# Patient Record
Sex: Female | Born: 1965 | Race: White | Hispanic: No | Marital: Married | Smoking: Never smoker
Health system: Southern US, Community
[De-identification: ages and names within clinical notes are randomized; demographics above are authoritative.]

## PROBLEM LIST (undated history)

## (undated) DIAGNOSIS — R011 Cardiac murmur, unspecified: Secondary | ICD-10-CM

## (undated) DIAGNOSIS — G4730 Sleep apnea, unspecified: Secondary | ICD-10-CM

## (undated) DIAGNOSIS — G5603 Carpal tunnel syndrome, bilateral upper limbs: Secondary | ICD-10-CM

## (undated) DIAGNOSIS — M1990 Unspecified osteoarthritis, unspecified site: Secondary | ICD-10-CM

## (undated) DIAGNOSIS — F419 Anxiety disorder, unspecified: Secondary | ICD-10-CM

## (undated) DIAGNOSIS — J45909 Unspecified asthma, uncomplicated: Secondary | ICD-10-CM

## (undated) DIAGNOSIS — K219 Gastro-esophageal reflux disease without esophagitis: Secondary | ICD-10-CM

## (undated) DIAGNOSIS — M65311 Trigger thumb, right thumb: Secondary | ICD-10-CM

## (undated) DIAGNOSIS — E669 Obesity, unspecified: Secondary | ICD-10-CM

## (undated) DIAGNOSIS — I10 Essential (primary) hypertension: Secondary | ICD-10-CM

## (undated) DIAGNOSIS — E7800 Pure hypercholesterolemia, unspecified: Secondary | ICD-10-CM

## (undated) DIAGNOSIS — K589 Irritable bowel syndrome without diarrhea: Secondary | ICD-10-CM

## (undated) DIAGNOSIS — K5090 Crohn's disease, unspecified, without complications: Secondary | ICD-10-CM

## (undated) HISTORY — PX: CYST REMOVAL HAND: SHX6279

## (undated) HISTORY — DX: Essential (primary) hypertension: I10

## (undated) HISTORY — PX: OTHER SURGICAL HISTORY: SHX169

## (undated) HISTORY — DX: Crohn's disease, unspecified, without complications: K50.90

## (undated) HISTORY — DX: Pure hypercholesterolemia, unspecified: E78.00

## (undated) HISTORY — PX: TONSILLECTOMY: SUR1361

---

## 2002-05-14 ENCOUNTER — Inpatient Hospital Stay (HOSPITAL_COMMUNITY): Admission: AD | Admit: 2002-05-14 | Discharge: 2002-05-16 | Payer: Self-pay | Admitting: Internal Medicine

## 2002-06-30 ENCOUNTER — Encounter (HOSPITAL_COMMUNITY): Admission: RE | Admit: 2002-06-30 | Discharge: 2002-07-30 | Payer: Self-pay | Admitting: Internal Medicine

## 2002-08-21 ENCOUNTER — Encounter (HOSPITAL_COMMUNITY): Admission: RE | Admit: 2002-08-21 | Discharge: 2002-09-20 | Payer: Self-pay | Admitting: Internal Medicine

## 2004-04-30 ENCOUNTER — Inpatient Hospital Stay (HOSPITAL_COMMUNITY): Admission: EM | Admit: 2004-04-30 | Discharge: 2004-05-04 | Payer: Self-pay | Admitting: Emergency Medicine

## 2004-05-14 ENCOUNTER — Ambulatory Visit (HOSPITAL_COMMUNITY): Admission: RE | Admit: 2004-05-14 | Discharge: 2004-05-14 | Payer: Self-pay | Admitting: Internal Medicine

## 2004-09-24 ENCOUNTER — Ambulatory Visit: Payer: Self-pay | Admitting: Internal Medicine

## 2005-02-23 ENCOUNTER — Ambulatory Visit: Payer: Self-pay | Admitting: Internal Medicine

## 2005-10-01 ENCOUNTER — Ambulatory Visit: Payer: Self-pay | Admitting: Internal Medicine

## 2006-03-31 ENCOUNTER — Ambulatory Visit: Payer: Self-pay | Admitting: Internal Medicine

## 2006-11-11 ENCOUNTER — Ambulatory Visit: Payer: Self-pay | Admitting: Internal Medicine

## 2009-08-31 DIAGNOSIS — J189 Pneumonia, unspecified organism: Secondary | ICD-10-CM

## 2009-08-31 HISTORY — DX: Pneumonia, unspecified organism: J18.9

## 2010-09-09 ENCOUNTER — Ambulatory Visit: Admit: 2010-09-09 | Payer: Self-pay | Admitting: Internal Medicine

## 2010-09-21 ENCOUNTER — Encounter: Payer: Self-pay | Admitting: Family Medicine

## 2010-10-07 ENCOUNTER — Ambulatory Visit (INDEPENDENT_AMBULATORY_CARE_PROVIDER_SITE_OTHER): Payer: BC Managed Care – PPO | Admitting: Internal Medicine

## 2010-10-07 DIAGNOSIS — K5090 Crohn's disease, unspecified, without complications: Secondary | ICD-10-CM

## 2011-01-16 NOTE — Procedures (Signed)
NAME:  [name], [name]                      ACCOUNT NO.:  [id number]   MEDICAL RECORD NO.:  [id number]                   PATIENT TYPE:  INP   LOCATION:  A306                                 FACILITY:  [facility]   PHYSICIAN:  Vida Roller, M.D.                DATE OF BIRTH:  [birth year]   DATE OF PROCEDURE:  05/01/2004  DATE OF DISCHARGE:                                  ECHOCARDIOGRAM   PRIMARY CARE PHYSICIAN:  Vania Rea, M.D.   TAPE NUMBER:  [id number]   TAPE COUNT:  [id number]   CLINICAL INFORMATION:  A 45 year old woman with Crohn's disease.   TECHNICAL QUALITY:  The technical quality of this study is extremely poor.   M-MODE TRACINGS:  1.  The aorta is 27 mm.  2.  The left atrium is 32 mm.  3.  The septum is 9 mm.  4.  The posterior wall is 9 mm.  5.  The left ventricular diastolic dimension is 46 mm.  6.  The left ventricular systolic dimension is 31 mm.   2-D AND DOPPLER IMAGING:  1.  The left ventricle is normal size with normal systolic function.      Assessment of wall motion is beyond the limits of this study.  2.  The right ventricle is normal size.  Both atria appear to be normal      size.  3.  The valvular structures are difficult to assess.  There does not appear      to be any significant mitral regurgitation or aortic stenosis or aortic      regurgitation.  Beyond that, this study is limited.      ___________________________________________                                            Vida Roller, M.D.   JH/MEDQ  D:  05/01/2004  T:  05/02/2004  Job:  [id number]

## 2011-01-16 NOTE — Discharge Summary (Signed)
NAME:  [name], [name]                      ACCOUNT NO.:  [id number]   MEDICAL RECORD NO.:  [id number]                   PATIENT TYPE:  INP   LOCATION:  A306                                 FACILITY:  [facility]   PHYSICIAN:  Melvyn Novas, MD        DATE OF BIRTH:  [birth year]   DATE OF ADMISSION:  04/30/2004  DATE OF DISCHARGE:  05/04/2004                                 DISCHARGE SUMMARY   HISTORY:  The patient is a 45 year old female with Crohn's colitis who came  to the emergency room with some bloody diarrhea and lower abdominal  cramping.  She had been off of her Pentasa for several weeks.  She was  admitted to the hospital and started on oral steroids.  She was given IV  Flagyl as well as Cipro.  She was also started on steroids while in the  hospital.  Her symptoms were subsiding on the clear-liquid diet.  She was  started on IV Solu-Medrol and then subsequently on p.o. prednisone 40 mg per  day.  In addition, six mercaptopurine 50 mg per day was added.  She resolved  clinically and had decreasing hematochezia and only one or two episodes of  loose stools a day as opposed to frank diarrhea.  Her CBC and SMA-7 were  essentially normal.  Hemoglobin was 12.1 on the day of discharge.   DISCHARGE MEDICATIONS:  1.  Advair 250/50, q.12h.  2.  Pentasa 1000 mg q.i.d.  3.  Cipro 500 b.i.d. for one week.  4.  Flagyl 250 q.8h for week.  5.  Six mercaptopurine 50 mg daily.  6.  Prednisone 40 mg per day.   FOLLOWUP:  She will be followed up by Dr. Karilyn Cota in the office in one to two  week's time.     ___________________________________________                                         Melvyn Novas, MD   RMD/MEDQ  D:  05/04/2004  T:  05/04/2004  Job:  [id number]

## 2011-01-16 NOTE — Consult Note (Signed)
[name], [name]                      ACCOUNT NO.:  [id number]   MEDICAL RECORD NO.:  [id number]                   PATIENT TYPE:  INP   LOCATION:  A306                                 FACILITY:  [facility]   PHYSICIAN:  R. Roetta Sessions, M.D.              DATE OF BIRTH:  03/07/1966   DATE OF CONSULTATION:  05/01/2004  DATE OF DISCHARGE:                                   CONSULTATION   REASON FOR CONSULTATION:  Crohn's colitis.   HISTORY OF PRESENT ILLNESS:  The patient is a 45 year old Caucasian female  with a history of Crohn's colitis who was admitted with a suspected flare.  She was recently in our office initially on April 02, 2004 with suspected  Crohn's colitis flare.  She had been off of her mesalamine for a couple of  days due to difficulty getting her medication at the pharmacy.  She had also  discontinued Imuran in March with plans to conceive a third child.  She was  started on Rowasa enemas at that time.  She had a stool culture and C.  difficile which were negative.  She called the office approximately a week  or so later, stating that the Rowasa enemas had not helped and her symptoms  had worsened.  She was started on prednisone 30 mg daily at that time.  I  saw her 4 days later and she had already noted a dramatic improvement.  In  fact, her stools were no longer as bloody and she was down to having six  bowel movements daily.  She has had consistency in her stools as well.  She  was continued on a prednisone taper at that time.  With this evaluation, she  had been noted to have an elevated eosinophil count of 11%, however, this  had decreased back to normal on prednisone therapy.  She called earlier in  the week saying that she was having multiple bloody stools again.  Her  prednisone was increased back to 30 mg and she was started on Flagyl 250 mg  t.i.d.  She was also on Imodium 2 mg t.i.d. and Pentasa.  The patient called  yesterday and stated that she just was  not doing well.  She was having 17 to  18 bloody bowel movements daily.  She was having severe nausea but no  vomiting.  She was running low-grade temperatures of 99 degrees.  Dr. Karilyn Cota  felt that she needed to be admitted and she was asked to come to the  emergency department for evaluation.   In the emergency department, her white count was 13,600, hemoglobin 13.3,  hematocrit 39.1, platelets 342,000.  BUN 11, creatinine 0.9, potassium 4.2,  glucose 112, albumin 2.8, otherwise LFTs and lipase normal.  HCG was  negative.  Today, her white count remains at 13,600, eosinophil count is  11,000,  hemoglobin is 12, hematocrit 34.  She was continued on prednisone  30  mg orally daily along with Flagyl 250 mg t.i.d. and Imodium 2 mg t.i.d.  and Pentasa 1000 mg q.i.d.   The patient continues to complain of multiple bloody stools daily.  She has  had six to eight stools since admission, late last night.  She has seen a  large amount of fresh blood in her bowel movements.  Her stools are very  liquid.  She is having severe pain along the upper abdomen and down the left  side of the abdomen, especially with bowel movements.  She has a constant  pain, 5/10, at other times as well.  She feels diaphoretic with bowel  movements.  She feels like she is going to pass out.  She continues to be  nauseated but has had no vomiting.  Temperature was 100.7 last night.   MEDICATIONS PRIOR TO ADMISSION:  1.  Pentasa 1000 mg q.i.d.  2.  Levsin sublingual q.a.c. and q.h.s.  3.  Prednisone 30 mg daily.  4.  Flagyl 250 mg t.i.d.  5.  Advair b.i.d.  6.  Imodium 2 mg t.i.d.  7.  Depo-Provera x3 months.   ALLERGIES:  PENICILLIN CAUSES RASH.  REMICADE CAUSED ARTHRITIC-TYPE REACTION  WITH SEVERE JOINT STIFFNESS AND PAIN.   PAST MEDICAL HISTORY:  Bronchial asthma, Crohn's colitis diagnosed in July  1999 by colonoscopy at which time she presented with bloody diarrhea.  She  has been on Imuran for quite some time,  however, discontinued this in  February 2005 in order to prepare to conceive a third child.   PAST SURGICAL HISTORY:  SHe has had a prior tonsillectomy, excision left  hand ganglion cyst and cyst removed from her back.   FAMILY HISTORY:  Negative for chronic GI illnesses or colorectal cancer.   SOCIAL HISTORY:  She is married and has two sons.  She works for the school  system.  She denies any tobacco or alcohol use.   REVIEW OF SYSTEMS:  Please see HPI for GI.  CARDIOPULMONARY:  Denies any  chest pain or shortness of breath.  GENITOURINARY:  She states she sees  blood when she wipes after urinating but denies any dysuria.   PHYSICAL EXAMINATION:  VITAL SIGNS:  Weight 245.5, height 63 inches.  T-max  100.7, current temperature 98.4, pulse 107, respirations 20, blood pressure  119/72.  GENERAL:  Pleasant, obese, Caucasian female in no acute distress.  SKIN:  Warm and dry, no jaundice.  HEENT:  Pupils equal round and reactive to light, conjunctivae were pink,  sclerae nonicteric.  Oropharyngeal mucosa moist and pink, no lesions,  erythema or exudate.  NECK:  No lymphadenopathy or thyromegaly.  CHEST:  Lungs clear to auscultation.  CARDIAC:  Regular rate and rhythm, normal S1/S2, no murmurs, rubs or  gallops.  ABDOMEN:  Positive bowel sounds, obese but symmetrical and soft.  She has  moderate tenderness in the epigastric region, along the left and to deep  palpation.  Positive guarding.  No rebound tenderness, organomegaly or  masses.  EXTREMITIES:  No edema.   LABORATORY DATA:  As mentioned in the HPI.  In addition, total bilirubin  1.2, alkaline phosphatase 88, AST 11, ALT 16, amylase 55, lipase 24.   Acute abdominal series, results pending.   IMPRESSION:  The patient is a pleasant 45 year old lady with approximately 6-  year history of Crohn's colitis who presents with suspected flare of her  disease.  She has been on treatment as an outpatient but was not  improving, therefore, required  admission.  Somewhat clouding her picture is the fact  that she has an elevated eosinophil count which did initially respond to  prednisone therapy.  We cannot rule out that eosinophilia gastroenteritis is  playing a role in her symptoms.  Unfortunately, the patient had been doing  well while on her 6-MP, but this was stopped in order to consider having an  additional child.  She has been off therapy now since February 2005.   PLAN:  1.  We will switch her prednisone to IV Solu-Medrol 30 mg q.6h.  2.  Switch Flagyl to IV 500 mg q.8h.  We will add IV Cipro 400 mg q.12h.  We      will add Levsin q.a.c. and obtain urinalysis for culture and if positive      we will follow up on stool studies.  We will review acute abdominal      series results as they are available.  3.  Ultimately, if the patient is willing, would recommend that she go back      on her immunosuppressant therapy in the way of 6-MP.     ________________________________________  ___________________________________________  Tana Coast, P.AJonathon Bellows, M.D.   LL/MEDQ  D:  05/01/2004  T:  05/01/2004  Job:  [id number]

## 2011-01-16 NOTE — H&P (Signed)
NAME:  [name], [name]                      ACCOUNT NO.:  [id number]   MEDICAL RECORD NO.:  [id number]                   PATIENT TYPE:  INP   LOCATION:  A306                                 FACILITY:  [facility]   PHYSICIAN:  Tesfaye D. Felecia Mcintyre, M.D.              DATE OF BIRTH:  08/26/1966   DATE OF ADMISSION:  04/30/2004  DATE OF DISCHARGE:                                HISTORY & PHYSICAL   CHIEF COMPLAINT:  Nausea, vomiting and a bloody diarrhea.   HISTORY OF PRESENT ILLNESS:  This is a 45 year old female patient with a  known case of Crohn's disease since 1998; came to the emergency room with  the above complaint, the patient had worsening of her symptoms for the last  1 month.  The patient was seen by her gastroenterologist, Dr. Karilyn Cota, and  she was started on oral steroids. The patient was also given Flagyl and her  medication has been adjusted.  Her symptoms continued to get worse.  Since  yesterday the patient was having recurrent vomiting and was unable to  tolerate any oral intake.  She was getting weak and dizzy.  The patient then  came to the emergency room where she was evaluated.  The patient was  admitted for further treatment.   REVIEW OF SYSTEMS:  The patient feels weak and has abdominal pain.  She has  no fever, chills, chest pain, dysuria, urgency, or frequency of urination.   PAST MEDICAL HISTORY:  1.  Crohn's disease since 1998.  2.  Bronchial asthma.  3.  History of tonsillectomy.  4.  History of excision of ganglion from the left hand.  5.  History of cyst removal from her back.   CURRENT MEDICATIONS:  1.  Pentasa 1000 mg p.o. q.i.d.  2.  Prednisolone 30 mg p.o. daily.  3.  Imodium 2 mg p.o. t.i.d.  4.  Flagyl 250 mg t.i.d.  5.  Advair 500/50 two puffs b.i.d.  6.  Multivitamin 1 tablet p.o. daily.   SOCIAL HISTORY:  The patient is married. She works for the school system.  The patient has no history of alcohol, tobacco or substance abuse.   PHYSICAL  EXAMINATION:  GENERAL:  The patient is alert, awake, chronically  sick looking.  VITAL SIGNS:  Blood pressure 137/77, pulse 108, respiratory rate 20,  temperature 99.1 degree F.  HEENT:  Pupils are equal, reactive.  NECK:  Neck is supple.  CHEST:  Decreased air entry.  A few rhonchi.  CARDIOVASCULAR:  First and second heart sounds heard.  No murmurs, no  gallops.  ABDOMEN:  Soft and lax, bowel sounds are positive.  No organomegaly.  EXTREMITIES:  No leg edema.   LABS ON ADMISSION:  CBC WBC 13.6, hemoglobin 13.3, hematocrit 39.1,  platelets 342.  Sodium 136, potassium 4.2, chloride 102, carbon dioxide 27,  glucose 112, BUN 11, creatinine 0.9.  Total bilirubin 1.2, alkaline  phosphatase 88, SGOT 11,  SGPT 16, total protein 6.7, albumin 2.8, calcium  8.7, amylase 56, lipase 24.   ASSESSMENT:  This is a 45 year old female patient with known case of Crohn's  disease came with nausea, vomiting and bloody diarrhea.  She has been on  follow up with her gastroenterologist, Dr. Karilyn Cota.  She has been on oral  steroids and Flagyl.  However, the patient's symptoms are getting worse and  in the last 24 hours the patient has been recurrently vomiting and unable to  keep her oral intake.  The patient obviously has exacerbation of her Crohn's  disease.   PLAN:  Will keep the patient on clear liquid diet.  Will continue IV fluid.  Will continue the patient on current oral steroid.  Will do GI consult and  will continue the patient on her regular medications.     ___________________________________________                                         [name], M.D.   TDF/MEDQ  D:  05/01/2004  T:  05/01/2004  Job:  [id number]

## 2011-09-25 ENCOUNTER — Encounter (INDEPENDENT_AMBULATORY_CARE_PROVIDER_SITE_OTHER): Payer: Self-pay | Admitting: *Deleted

## 2011-10-08 ENCOUNTER — Encounter (INDEPENDENT_AMBULATORY_CARE_PROVIDER_SITE_OTHER): Payer: Self-pay | Admitting: Internal Medicine

## 2011-10-08 ENCOUNTER — Ambulatory Visit (INDEPENDENT_AMBULATORY_CARE_PROVIDER_SITE_OTHER): Payer: BC Managed Care – PPO | Admitting: Internal Medicine

## 2011-10-08 DIAGNOSIS — K5090 Crohn's disease, unspecified, without complications: Secondary | ICD-10-CM

## 2011-10-08 DIAGNOSIS — I10 Essential (primary) hypertension: Secondary | ICD-10-CM

## 2011-10-08 DIAGNOSIS — E119 Type 2 diabetes mellitus without complications: Secondary | ICD-10-CM | POA: Insufficient documentation

## 2011-10-08 DIAGNOSIS — E1159 Type 2 diabetes mellitus with other circulatory complications: Secondary | ICD-10-CM | POA: Insufficient documentation

## 2011-10-08 DIAGNOSIS — O24919 Unspecified diabetes mellitus in pregnancy, unspecified trimester: Secondary | ICD-10-CM

## 2011-10-08 DIAGNOSIS — I152 Hypertension secondary to endocrine disorders: Secondary | ICD-10-CM | POA: Insufficient documentation

## 2011-10-08 DIAGNOSIS — E7800 Pure hypercholesterolemia, unspecified: Secondary | ICD-10-CM

## 2011-10-08 LAB — CBC WITH DIFFERENTIAL/PLATELET
Basophils Absolute: 0 10*3/uL (ref 0.0–0.1)
Basophils Relative: 0 % (ref 0–1)
Eosinophils Absolute: 0.1 10*3/uL (ref 0.0–0.7)
Eosinophils Relative: 1 % (ref 0–5)
HCT: 41.6 % (ref 36.0–46.0)
Hemoglobin: 13.5 g/dL (ref 12.0–15.0)
Lymphocytes Relative: 27 % (ref 12–46)
Lymphs Abs: 1.6 10*3/uL (ref 0.7–4.0)
MCH: 33.4 pg (ref 26.0–34.0)
MCHC: 32.5 g/dL (ref 30.0–36.0)
MCV: 103 fL — ABNORMAL HIGH (ref 78.0–100.0)
Monocytes Absolute: 0.4 10*3/uL (ref 0.1–1.0)
Monocytes Relative: 7 % (ref 3–12)
Neutro Abs: 4 10*3/uL (ref 1.7–7.7)
Neutrophils Relative %: 65 % (ref 43–77)
Platelets: 259 10*3/uL (ref 150–400)
RBC: 4.04 MIL/uL (ref 3.87–5.11)
RDW: 13.2 % (ref 11.5–15.5)
WBC: 6.1 10*3/uL (ref 4.0–10.5)

## 2011-10-08 NOTE — Progress Notes (Signed)
Subjective:     Patient ID: [name], female   DOB: 1966-06-03, 46 y.o.   MRN: [id number]  HPI  [name] is a 46 yr old female presenting today for f/u of her Crohn's . She is here for a scheduled visit.  She is a patient of Dr Almond Lint.   She has an 11 yr hx of Crohn's/UC.  She has been on 6-MP for about 6 yrs.  She tells me she is doing good. She says she has not had a flare since her last visit. No rectal bleeding. No irregular stools.  Appetite is good.  She has actually gained about 20 pounds. She is exercising daily to try to lose weight. Her last colonoscopy was in 2008 revealing active disease in the distal colon and she had a few polyps at transverse colon and all of these non adenomatous. She usually has 1-2 stools a day.  No diarrhea.   Review of Systems see hpi     Current Outpatient Prescriptions  Medication Sig Dispense Refill  . albuterol (PROVENTIL,VENTOLIN) 90 MCG/ACT inhaler Inhale 2 puffs into the lungs every 6 (six) hours as needed.      . ALPRAZolam (XANAX) 0.5 MG tablet Take 0.5 mg by mouth at bedtime as needed.      . fluticasone-salmeterol (ADVAIR HFA) 115-21 MCG/ACT inhaler Inhale 2 puffs into the lungs daily.      Marland Kitchen lisinopril (PRINIVIL,ZESTRIL) 10 MG tablet Take 10 mg by mouth daily.      . medroxyPROGESTERone (DEPO-PROVERA) 150 MG/ML injection Inject 150 mg into the muscle every 3 (three) months.      . meloxicam (MOBIC) 15 MG tablet Take 15 mg by mouth daily.      . mercaptopurine (PURINETHOL) 50 MG tablet Take 50 mg by mouth 2 (two) times daily before a meal. Give on an empty stomach 1 hour before or 2 hours after meals. Caution: Chemotherapy.      . mesalamine (PENTASA) 500 MG CR capsule Take 1,000 mg by mouth 4 (four) times daily.      . Multiple Vitamin (MULITIVITAMIN WITH MINERALS) TABS Take 1 tablet by mouth daily.      . rosuvastatin (CRESTOR) 5 MG tablet Take 5 mg by mouth daily.       Past Medical History  Diagnosis Date  . Crohn disease     . 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 Hypertension   . High cholesterol   . Diabetes mellitus    History   Social History  . Marital Status: Married    Spouse Name: N/A    Number of Children: N/A  . Years of Education: N/A   Occupational History  . Not on file.   Social History Main Topics  . Smoking status: Never Smoker   . Smokeless tobacco: Not on file  . Alcohol Use: No  . Drug Use: No  . Sexually Active: Not on file   Other Topics Concern  . Not on file   Social History Narrative  . No narrative on file   Past Surgical History  Procedure Date  . Tonsillectomy   . Cyst removed from left hand    Family Status  Relation Status Death Age  . Mother Alive     good health  . Father Alive     spinal problems   History   Social History  . Marital Status: Married    Spouse Name: N/A    Number of Children: N/A  .  Years of Education: N/A   Occupational History  . Not on file.   Social History Main Topics  . Smoking status: Never Smoker   . Smokeless tobacco: Not on file  . Alcohol Use: No  . Drug Use: No  . Sexually Active: Not on file   Other Topics Concern  . Not on file   Social History Narrative  . No narrative on file   Allergies  Allergen Reactions  . Cefzil   . Penicillins   . Remicade (Infliximab)     Objective:   Physical Exam Filed Vitals:   10/08/11 1050  Height: 5\' 3"  (1.6 m)  Weight: 296 lb 12.8 oz (134.628 kg)   Alert and oriented. Skin warm and dry. Oral mucosa is moist.   . Sclera anicteric, conjunctivae is pink. Thyroid not enlarged. No cervical lymphadenopathy. Lungs clear. Heart regular rate and rhythm.  Abdomen is soft. Bowel sounds are positive. No hepatomegaly. No abdominal masses felt. No tenderness.  No edema to lower extremities. Patient is alert and oriented.      Assessment:    Crohns/  which appears to be in remission at this time.     Plan:     CBC, and CRP on her today. Continue present  medications. If labs are normal, may consider dropping to 50mg  a day.

## 2011-10-08 NOTE — Patient Instructions (Signed)
CBC and CRP  today.  

## 2011-10-09 LAB — C-REACTIVE PROTEIN: CRP: 0.28 mg/dL (ref <0.60)

## 2011-12-05 ENCOUNTER — Other Ambulatory Visit (INDEPENDENT_AMBULATORY_CARE_PROVIDER_SITE_OTHER): Payer: Self-pay | Admitting: Internal Medicine

## 2011-12-17 ENCOUNTER — Other Ambulatory Visit (INDEPENDENT_AMBULATORY_CARE_PROVIDER_SITE_OTHER): Payer: Self-pay | Admitting: Internal Medicine

## 2011-12-31 ENCOUNTER — Telehealth (INDEPENDENT_AMBULATORY_CARE_PROVIDER_SITE_OTHER): Payer: Self-pay | Admitting: *Deleted

## 2011-12-31 NOTE — Telephone Encounter (Signed)
This has been address with a written Rx

## 2011-12-31 NOTE — Telephone Encounter (Signed)
Refill request rec'd for: Alprazolam 0.5 mg 1 tab po bid

## 2012-04-06 ENCOUNTER — Encounter (HOSPITAL_COMMUNITY): Payer: Self-pay | Admitting: Pharmacy Technician

## 2012-04-07 ENCOUNTER — Other Ambulatory Visit (HOSPITAL_COMMUNITY): Payer: Self-pay | Admitting: Orthopaedic Surgery

## 2012-04-08 ENCOUNTER — Ambulatory Visit (HOSPITAL_COMMUNITY)
Admission: RE | Admit: 2012-04-08 | Discharge: 2012-04-08 | Disposition: A | Discharge status: Home / Self Care | Payer: BC Managed Care – PPO | Source: Ambulatory Visit | Attending: Orthopaedic Surgery | Admitting: Orthopaedic Surgery

## 2012-04-08 ENCOUNTER — Encounter (HOSPITAL_COMMUNITY)
Admission: RE | Admit: 2012-04-08 | Discharge: 2012-04-08 | Disposition: A | Discharge status: Home / Self Care | Payer: BC Managed Care – PPO | Source: Ambulatory Visit | Attending: Orthopaedic Surgery | Admitting: Orthopaedic Surgery

## 2012-04-08 ENCOUNTER — Encounter (HOSPITAL_COMMUNITY): Payer: Self-pay

## 2012-04-08 DIAGNOSIS — Z01818 Encounter for other preprocedural examination: Secondary | ICD-10-CM | POA: Insufficient documentation

## 2012-04-08 DIAGNOSIS — Z01810 Encounter for preprocedural cardiovascular examination: Secondary | ICD-10-CM | POA: Insufficient documentation

## 2012-04-08 DIAGNOSIS — Z01812 Encounter for preprocedural laboratory examination: Secondary | ICD-10-CM | POA: Insufficient documentation

## 2012-04-08 HISTORY — DX: Anxiety disorder, unspecified: F41.9

## 2012-04-08 HISTORY — DX: Unspecified asthma, uncomplicated: J45.909

## 2012-04-08 LAB — BASIC METABOLIC PANEL
BUN: 14 mg/dL (ref 6–23)
CO2: 26 mEq/L (ref 19–32)
Calcium: 9.4 mg/dL (ref 8.4–10.5)
Chloride: 105 mEq/L (ref 96–112)
Creatinine, Ser: 0.62 mg/dL (ref 0.50–1.10)
GFR calc Af Amer: >90 mL/min (ref >90)
GFR calc non Af Amer: >90 mL/min (ref >90)
Glucose, Bld: 170 mg/dL — ABNORMAL HIGH (ref 70–99)
Potassium: 3.5 mEq/L (ref 3.5–5.1)
Sodium: 140 mEq/L (ref 135–145)

## 2012-04-08 LAB — CBC
HCT: 40.3 % (ref 36.0–46.0)
Hemoglobin: 13.8 g/dL (ref 12.0–15.0)
MCH: 34.3 pg — ABNORMAL HIGH (ref 26.0–34.0)
MCHC: 34.2 g/dL (ref 30.0–36.0)
MCV: 100.2 fL — ABNORMAL HIGH (ref 78.0–100.0)
Platelets: 256 10*3/uL (ref 150–400)
RBC: 4.02 MIL/uL (ref 3.87–5.11)
RDW: 12.5 % (ref 11.5–15.5)
WBC: 6.8 10*3/uL (ref 4.0–10.5)

## 2012-04-08 LAB — ABO/RH: ABO/RH(D): A POS

## 2012-04-08 LAB — SURGICAL PCR SCREEN
MRSA, PCR: NEGATIVE
Staphylococcus aureus: NEGATIVE

## 2012-04-08 LAB — TYPE AND SCREEN
ABO/RH(D): A POS
Antibody Screen: NEGATIVE

## 2012-04-08 LAB — HCG, SERUM, QUALITATIVE: Preg, Serum: NEGATIVE

## 2012-04-08 NOTE — Pre-Procedure Instructions (Signed)
20 [name]  04/08/2012   Your procedure is scheduled on:  04/18/12  Report to [facility] at 1030 AM.  Call this number if you have problems the morning of surgery: [phone]   Remember:   Do not eat food:After Midnight.    Take these medicines the morning of surgery with A SIP OF WATER: xanax,zyrtec,celexa,cardizem,advair   Do not wear jewelry, make-up or nail polish.  Do not wear lotions, powders, or perfumes. You may wear deodorant.  Do not shave 48 hours prior to surgery. Men may shave face and neck.  Do not bring valuables to the hospital.  Contacts, dentures or bridgework may not be worn into surgery.  Leave suitcase in the car. After surgery it may be brought to your room.  For patients admitted to the hospital, checkout time is 11:00 AM the day of discharge.   Patients discharged the day of surgery will not be allowed to drive home.  Name and phone number of your driver: [email or web address]  Special Instructions: CHG Shower Use Special Wash: 1/2 bottle night before surgery and 1/2 bottle morning of surgery.   Please read over the following fact sheets that you were given: Pain Booklet, Coughing and Deep Breathing, Blood Transfusion Information, Total Joint Packet, MRSA Information and Surgical Site Infection Prevention

## 2012-04-11 NOTE — Consult Note (Addendum)
Anesthesia chart review: Patient is a 46 year old female posted for right total hip arthroplasty by Dr. Ophelia Charter on 04/18/2012.  History includes morbid obesity (BMI 52), non-smoker, Crohn's disease, ulcerative colitis, hypercholesterolemia, HTN, anxiety, diet controlled DM2, asthma.  PCP is Dr. Donzetta Sprung at [facility] [phone]).  Labs from 04/08/12 are noted.  Non-fasting glucose is 170.  Cr 0.62, H/H 13.8/40.3.  Serum pregnancy test was negative.  T&S done.  (No additional labs were done at her PAT appointment because the surgeon's orders were not available.)  Will need PT/PTT on arrival.  There were no acute findings on CXR from 04/08/12.  EKG from 04/08/12 showed NSR, cannot rule out anterior infarct (age undetermined).  There are no prior EKGs done within [facility].  [facility] sent a prior EKG from 11/11/10.  Overall, I think her EKGs are stable.  She had no significant abnormalities on echo done on 05/01/04; however the technical quality was felt to be "extremely poor." No CV symptoms were documented at her PAT visit. If she remains asymptomatic from a CV standpoint then anticipate she can proceed as planned.  Shonna Chock, PA-C

## 2012-04-12 ENCOUNTER — Other Ambulatory Visit (HOSPITAL_COMMUNITY): Payer: Self-pay | Admitting: Orthopedic Surgery

## 2012-04-12 NOTE — Progress Notes (Signed)
Call to Dr. Ophelia Charter office, spoke /w Judeth Cornfield, requested that preop orders be entered ASAP.

## 2012-04-14 ENCOUNTER — Other Ambulatory Visit (HOSPITAL_COMMUNITY): Payer: Self-pay | Admitting: Orthopaedic Surgery

## 2012-04-14 NOTE — Progress Notes (Signed)
Spoke with [name] at Dr Ophelia Charter' office the re-requested orders.  She stated she will speak with his scheduler to get them entered.

## 2012-04-15 NOTE — Progress Notes (Signed)
1605.Marland KitchenLMOM to have patient come at 11:45 AM..Marland KitchenDA

## 2012-04-17 MED ORDER — DEXTROSE 5 % IV SOLN
3.0000 g | INTRAVENOUS | Status: DC
  Filled 2012-04-17: qty 3000

## 2012-04-18 ENCOUNTER — Ambulatory Visit (HOSPITAL_COMMUNITY): Payer: BC Managed Care – PPO

## 2012-04-18 ENCOUNTER — Encounter (HOSPITAL_COMMUNITY): Payer: Self-pay | Admitting: Vascular Surgery

## 2012-04-18 ENCOUNTER — Inpatient Hospital Stay (HOSPITAL_COMMUNITY): Payer: BC Managed Care – PPO

## 2012-04-18 ENCOUNTER — Encounter (HOSPITAL_COMMUNITY): Payer: Self-pay | Admitting: *Deleted

## 2012-04-18 ENCOUNTER — Inpatient Hospital Stay (HOSPITAL_COMMUNITY)
Admission: RE | Admit: 2012-04-18 | Discharge: 2012-04-20 | DRG: 818 | Disposition: A | Discharge status: Home / Self Care | Payer: BC Managed Care – PPO | Source: Ambulatory Visit | Attending: Orthopaedic Surgery | Admitting: Orthopaedic Surgery

## 2012-04-18 ENCOUNTER — Encounter (HOSPITAL_COMMUNITY)
Admission: RE | Disposition: A | Discharge status: Home / Self Care | Payer: Self-pay | Source: Ambulatory Visit | Attending: Orthopaedic Surgery

## 2012-04-18 ENCOUNTER — Ambulatory Visit (HOSPITAL_COMMUNITY): Payer: BC Managed Care – PPO | Admitting: Vascular Surgery

## 2012-04-18 DIAGNOSIS — M1611 Unilateral primary osteoarthritis, right hip: Secondary | ICD-10-CM

## 2012-04-18 DIAGNOSIS — M169 Osteoarthritis of hip, unspecified: Principal | ICD-10-CM | POA: Diagnosis present

## 2012-04-18 DIAGNOSIS — K5090 Crohn's disease, unspecified, without complications: Secondary | ICD-10-CM | POA: Diagnosis present

## 2012-04-18 DIAGNOSIS — Z880 Allergy status to penicillin: Secondary | ICD-10-CM

## 2012-04-18 DIAGNOSIS — Z888 Allergy status to other drugs, medicaments and biological substances status: Secondary | ICD-10-CM

## 2012-04-18 DIAGNOSIS — Z79899 Other long term (current) drug therapy: Secondary | ICD-10-CM

## 2012-04-18 DIAGNOSIS — E119 Type 2 diabetes mellitus without complications: Secondary | ICD-10-CM | POA: Diagnosis present

## 2012-04-18 DIAGNOSIS — Z7982 Long term (current) use of aspirin: Secondary | ICD-10-CM

## 2012-04-18 DIAGNOSIS — G4730 Sleep apnea, unspecified: Secondary | ICD-10-CM | POA: Diagnosis present

## 2012-04-18 DIAGNOSIS — F411 Generalized anxiety disorder: Secondary | ICD-10-CM | POA: Diagnosis present

## 2012-04-18 DIAGNOSIS — Z9089 Acquired absence of other organs: Secondary | ICD-10-CM

## 2012-04-18 DIAGNOSIS — M1610 Unilateral primary osteoarthritis, unspecified hip: Principal | ICD-10-CM | POA: Diagnosis present

## 2012-04-18 DIAGNOSIS — Z8711 Personal history of peptic ulcer disease: Secondary | ICD-10-CM

## 2012-04-18 DIAGNOSIS — J45909 Unspecified asthma, uncomplicated: Secondary | ICD-10-CM | POA: Diagnosis present

## 2012-04-18 DIAGNOSIS — Z6841 Body Mass Index (BMI) 40.0 and over, adult: Secondary | ICD-10-CM

## 2012-04-18 DIAGNOSIS — I10 Essential (primary) hypertension: Secondary | ICD-10-CM | POA: Diagnosis present

## 2012-04-18 DIAGNOSIS — E7800 Pure hypercholesterolemia, unspecified: Secondary | ICD-10-CM | POA: Diagnosis present

## 2012-04-18 HISTORY — DX: Sleep apnea, unspecified: G47.30

## 2012-04-18 HISTORY — PX: TOTAL HIP ARTHROPLASTY: SHX124

## 2012-04-18 LAB — COMPREHENSIVE METABOLIC PANEL
ALT: 13 U/L (ref 0–35)
AST: 13 U/L (ref 0–37)
Albumin: 3.6 g/dL (ref 3.5–5.2)
Alkaline Phosphatase: 63 U/L (ref 39–117)
BUN: 13 mg/dL (ref 6–23)
CO2: 27 mEq/L (ref 19–32)
Calcium: 9.3 mg/dL (ref 8.4–10.5)
Chloride: 106 mEq/L (ref 96–112)
Creatinine, Ser: 0.59 mg/dL (ref 0.50–1.10)
GFR calc Af Amer: >90 mL/min (ref >90)
GFR calc non Af Amer: >90 mL/min (ref >90)
Glucose, Bld: 113 mg/dL — ABNORMAL HIGH (ref 70–99)
Potassium: 3.7 mEq/L (ref 3.5–5.1)
Sodium: 141 mEq/L (ref 135–145)
Total Bilirubin: 0.9 mg/dL (ref 0.3–1.2)
Total Protein: 6.9 g/dL (ref 6.0–8.3)

## 2012-04-18 LAB — CBC
HCT: 38.5 % (ref 36.0–46.0)
Hemoglobin: 13.2 g/dL (ref 12.0–15.0)
MCH: 33.7 pg (ref 26.0–34.0)
MCHC: 34.3 g/dL (ref 30.0–36.0)
MCV: 98.2 fL (ref 78.0–100.0)
Platelets: 226 10*3/uL (ref 150–400)
RBC: 3.92 MIL/uL (ref 3.87–5.11)
RDW: 12.4 % (ref 11.5–15.5)
WBC: 7.3 10*3/uL (ref 4.0–10.5)

## 2012-04-18 LAB — GLUCOSE, CAPILLARY
Glucose-Capillary: 113 mg/dL — ABNORMAL HIGH (ref 70–99)
Glucose-Capillary: 126 mg/dL — ABNORMAL HIGH (ref 70–99)

## 2012-04-18 SURGERY — ARTHROPLASTY, HIP, TOTAL,POSTERIOR APPROACH
Anesthesia: General | Site: Hip | Laterality: Right | Wound class: Clean

## 2012-04-18 MED ORDER — DIPHENHYDRAMINE HCL 12.5 MG/5ML PO ELIX: 12.5000 mg | ORAL_SOLUTION | Freq: Four times a day (QID) | ORAL | Status: DC | PRN

## 2012-04-18 MED ORDER — METOCLOPRAMIDE HCL 5 MG/ML IJ SOLN: 5.0000 mg | Freq: Three times a day (TID) | INTRAMUSCULAR | Status: DC | PRN

## 2012-04-18 MED ORDER — HYDROMORPHONE HCL PF 1 MG/ML IJ SOLN
INTRAMUSCULAR | Status: AC
  Filled 2012-04-18: qty 1

## 2012-04-18 MED ORDER — SODIUM CHLORIDE 0.9 % IR SOLN
Status: DC | PRN
  Administered 2012-04-18: 1000 mL

## 2012-04-18 MED ORDER — PROPOFOL 10 MG/ML IV EMUL
INTRAVENOUS | Status: DC | PRN
  Administered 2012-04-18: 200 mg via INTRAVENOUS

## 2012-04-18 MED ORDER — METOCLOPRAMIDE HCL 10 MG PO TABS
5.0000 mg | ORAL_TABLET | Freq: Three times a day (TID) | ORAL | Status: DC | PRN
Start: 2012-04-18 — End: 2012-04-21

## 2012-04-18 MED ORDER — ALBUTEROL SULFATE HFA 108 (90 BASE) MCG/ACT IN AERS
2.0000 | INHALATION_SPRAY | Freq: Four times a day (QID) | RESPIRATORY_TRACT | Status: DC | PRN
  Filled 2012-04-18: qty 6.7

## 2012-04-18 MED ORDER — MEDROXYPROGESTERONE ACETATE 150 MG/ML IM SUSP: 150.0000 mg | INTRAMUSCULAR | Status: DC

## 2012-04-18 MED ORDER — METHOCARBAMOL 500 MG PO TABS
500.0000 mg | ORAL_TABLET | Freq: Four times a day (QID) | ORAL | Status: DC | PRN
  Administered 2012-04-19 (×2): 500 mg via ORAL
  Filled 2012-04-18 (×3): qty 1

## 2012-04-18 MED ORDER — ONDANSETRON HCL 4 MG/2ML IJ SOLN: 4.0000 mg | Freq: Four times a day (QID) | INTRAMUSCULAR | Status: DC | PRN

## 2012-04-18 MED ORDER — FENTANYL CITRATE 0.05 MG/ML IJ SOLN
INTRAMUSCULAR | Status: DC | PRN
  Administered 2012-04-18 (×2): 50 ug via INTRAVENOUS
  Administered 2012-04-18: 250 ug via INTRAVENOUS
  Administered 2012-04-18 (×5): 50 ug via INTRAVENOUS

## 2012-04-18 MED ORDER — GLYCOPYRROLATE 0.2 MG/ML IJ SOLN
INTRAMUSCULAR | Status: DC | PRN
  Administered 2012-04-18: .8 mg via INTRAVENOUS

## 2012-04-18 MED ORDER — ACETAMINOPHEN 650 MG RE SUPP: 650.0000 mg | Freq: Four times a day (QID) | RECTAL | Status: DC | PRN

## 2012-04-18 MED ORDER — KCL IN DEXTROSE-NACL 20-5-0.45 MEQ/L-%-% IV SOLN
INTRAVENOUS | Status: DC
  Administered 2012-04-18: 23:00:00 via INTRAVENOUS
  Administered 2012-04-19: 75 mL/h via INTRAVENOUS
  Filled 2012-04-18 (×5): qty 1000

## 2012-04-18 MED ORDER — KETOROLAC TROMETHAMINE 30 MG/ML IJ SOLN
INTRAMUSCULAR | Status: AC
  Filled 2012-04-18: qty 1

## 2012-04-18 MED ORDER — BUPIVACAINE HCL (PF) 0.25 % IJ SOLN
INTRAMUSCULAR | Status: AC
  Filled 2012-04-18: qty 30

## 2012-04-18 MED ORDER — ONDANSETRON HCL 4 MG/2ML IJ SOLN: 4.0000 mg | Freq: Once | INTRAMUSCULAR | Status: DC | PRN

## 2012-04-18 MED ORDER — OMEGA-3-ACID ETHYL ESTERS 1 G PO CAPS
2.0000 g | ORAL_CAPSULE | Freq: Every day | ORAL | Status: DC
  Administered 2012-04-19 – 2012-04-20 (×2): 2 g via ORAL
  Filled 2012-04-18 (×3): qty 2

## 2012-04-18 MED ORDER — ROCURONIUM BROMIDE 100 MG/10ML IV SOLN
INTRAVENOUS | Status: DC | PRN
  Administered 2012-04-18: 50 mg via INTRAVENOUS
  Administered 2012-04-18: 20 mg via INTRAVENOUS
  Administered 2012-04-18: 5 mg via INTRAVENOUS

## 2012-04-18 MED ORDER — LORATADINE 10 MG PO TABS
10.0000 mg | ORAL_TABLET | Freq: Every day | ORAL | Status: DC
  Administered 2012-04-18 – 2012-04-20 (×3): 10 mg via ORAL
  Filled 2012-04-18 (×3): qty 1

## 2012-04-18 MED ORDER — CITALOPRAM HYDROBROMIDE 20 MG PO TABS
20.0000 mg | ORAL_TABLET | Freq: Every day | ORAL | Status: DC
  Administered 2012-04-19 – 2012-04-20 (×2): 20 mg via ORAL
  Filled 2012-04-18 (×3): qty 1

## 2012-04-18 MED ORDER — OMEGA-3 FATTY ACIDS 1000 MG PO CAPS: 2.0000 g | ORAL_CAPSULE | Freq: Every day | ORAL | Status: DC

## 2012-04-18 MED ORDER — NEOSTIGMINE METHYLSULFATE 1 MG/ML IJ SOLN
INTRAMUSCULAR | Status: DC | PRN
  Administered 2012-04-18: 5 mg via INTRAVENOUS

## 2012-04-18 MED ORDER — FLUTICASONE-SALMETEROL 250-50 MCG/DOSE IN AEPB
1.0000 | INHALATION_SPRAY | Freq: Every day | RESPIRATORY_TRACT | Status: DC
  Administered 2012-04-19 – 2012-04-20 (×2): 1 via RESPIRATORY_TRACT
  Filled 2012-04-18: qty 14

## 2012-04-18 MED ORDER — CEFAZOLIN SODIUM-DEXTROSE 2-3 GM-% IV SOLR
2.0000 g | Freq: Four times a day (QID) | INTRAVENOUS | Status: AC
  Administered 2012-04-18 – 2012-04-19 (×2): 2 g via INTRAVENOUS
  Filled 2012-04-18 (×3): qty 50

## 2012-04-18 MED ORDER — DIPHENHYDRAMINE HCL 12.5 MG/5ML PO ELIX: 12.5000 mg | ORAL_SOLUTION | ORAL | Status: DC | PRN

## 2012-04-18 MED ORDER — LIDOCAINE HCL (CARDIAC) 20 MG/ML IV SOLN
INTRAVENOUS | Status: DC | PRN
  Administered 2012-04-18: 20 mg via INTRAVENOUS

## 2012-04-18 MED ORDER — MENTHOL 3 MG MT LOZG: 1.0000 | LOZENGE | OROMUCOSAL | Status: DC | PRN

## 2012-04-18 MED ORDER — ALUM & MAG HYDROXIDE-SIMETH 200-200-20 MG/5ML PO SUSP: 30.0000 mL | ORAL | Status: DC | PRN

## 2012-04-18 MED ORDER — MORPHINE SULFATE (PF) 1 MG/ML IV SOLN
INTRAVENOUS | Status: AC
  Filled 2012-04-18: qty 25

## 2012-04-18 MED ORDER — NALOXONE HCL 0.4 MG/ML IJ SOLN: 0.4000 mg | INTRAMUSCULAR | Status: DC | PRN

## 2012-04-18 MED ORDER — ASPIRIN EC 325 MG PO TBEC
325.0000 mg | DELAYED_RELEASE_TABLET | Freq: Every day | ORAL | Status: DC
  Administered 2012-04-19 – 2012-04-20 (×2): 325 mg via ORAL
  Filled 2012-04-18 (×3): qty 1

## 2012-04-18 MED ORDER — DOCUSATE SODIUM 100 MG PO CAPS
100.0000 mg | ORAL_CAPSULE | Freq: Two times a day (BID) | ORAL | Status: DC
  Administered 2012-04-18 – 2012-04-20 (×4): 100 mg via ORAL
  Filled 2012-04-18 (×4): qty 1

## 2012-04-18 MED ORDER — KETOROLAC TROMETHAMINE 30 MG/ML IJ SOLN
30.0000 mg | Freq: Once | INTRAMUSCULAR | Status: AC
  Administered 2012-04-18: 30 mg via INTRAVENOUS

## 2012-04-18 MED ORDER — ALPRAZOLAM 0.5 MG PO TABS
0.5000 mg | ORAL_TABLET | Freq: Two times a day (BID) | ORAL | Status: DC | PRN
  Administered 2012-04-18: 0.5 mg via ORAL
  Filled 2012-04-18: qty 1

## 2012-04-18 MED ORDER — HYDROMORPHONE HCL PF 1 MG/ML IJ SOLN
0.2500 mg | INTRAMUSCULAR | Status: DC | PRN
  Administered 2012-04-18 (×4): 0.5 mg via INTRAVENOUS

## 2012-04-18 MED ORDER — ONDANSETRON HCL 4 MG PO TABS: 4.0000 mg | ORAL_TABLET | Freq: Four times a day (QID) | ORAL | Status: DC | PRN

## 2012-04-18 MED ORDER — LACTATED RINGERS IV SOLN
INTRAVENOUS | Status: DC
  Administered 2012-04-18: 14:00:00 via INTRAVENOUS

## 2012-04-18 MED ORDER — PHENOL 1.4 % MT LIQD: 1.0000 | OROMUCOSAL | Status: DC | PRN

## 2012-04-18 MED ORDER — ACETAMINOPHEN 10 MG/ML IV SOLN
INTRAVENOUS | Status: AC
  Filled 2012-04-18: qty 100

## 2012-04-18 MED ORDER — DILTIAZEM HCL ER COATED BEADS 240 MG PO CP24
240.0000 mg | ORAL_CAPSULE | Freq: Every day | ORAL | Status: DC
  Administered 2012-04-19 – 2012-04-20 (×2): 240 mg via ORAL
  Filled 2012-04-18 (×2): qty 1

## 2012-04-18 MED ORDER — MERCAPTOPURINE 50 MG PO TABS
50.0000 mg | ORAL_TABLET | Freq: Every day | ORAL | Status: DC
  Administered 2012-04-19 – 2012-04-20 (×2): 50 mg via ORAL
  Filled 2012-04-18 (×3): qty 1

## 2012-04-18 MED ORDER — ACETAMINOPHEN 325 MG PO TABS: 650.0000 mg | ORAL_TABLET | Freq: Four times a day (QID) | ORAL | Status: DC | PRN

## 2012-04-18 MED ORDER — LACTATED RINGERS IV SOLN
INTRAVENOUS | Status: DC | PRN
  Administered 2012-04-18 (×3): via INTRAVENOUS

## 2012-04-18 MED ORDER — DIPHENHYDRAMINE HCL 50 MG/ML IJ SOLN
12.5000 mg | Freq: Four times a day (QID) | INTRAMUSCULAR | Status: DC | PRN
  Administered 2012-04-19: 12.5 mg via INTRAVENOUS
  Filled 2012-04-18: qty 1

## 2012-04-18 MED ORDER — DEXTROSE 5 % IV SOLN
3.0000 g | INTRAVENOUS | Status: DC | PRN
  Administered 2012-04-18: 3 g via INTRAVENOUS

## 2012-04-18 MED ORDER — HYDROCHLOROTHIAZIDE 12.5 MG PO CAPS
12.5000 mg | ORAL_CAPSULE | Freq: Every day | ORAL | Status: DC
  Administered 2012-04-20: 12.5 mg via ORAL
  Filled 2012-04-18 (×3): qty 1

## 2012-04-18 MED ORDER — METHOCARBAMOL 100 MG/ML IJ SOLN
500.0000 mg | Freq: Four times a day (QID) | INTRAVENOUS | Status: DC | PRN
  Administered 2012-04-18: 500 mg via INTRAVENOUS
  Filled 2012-04-18: qty 5

## 2012-04-18 MED ORDER — LISINOPRIL 20 MG PO TABS
20.0000 mg | ORAL_TABLET | Freq: Every day | ORAL | Status: DC
  Administered 2012-04-19 – 2012-04-20 (×2): 20 mg via ORAL
  Filled 2012-04-18 (×3): qty 1

## 2012-04-18 MED ORDER — FERROUS SULFATE 325 (65 FE) MG PO TABS
325.0000 mg | ORAL_TABLET | Freq: Three times a day (TID) | ORAL | Status: DC
  Administered 2012-04-18 – 2012-04-20 (×6): 325 mg via ORAL
  Filled 2012-04-18 (×8): qty 1

## 2012-04-18 MED ORDER — HYDROCODONE-ACETAMINOPHEN 5-325 MG PO TABS
ORAL_TABLET | ORAL | Status: AC
  Filled 2012-04-18: qty 1

## 2012-04-18 MED ORDER — ALBUTEROL 90 MCG/ACT IN AERS: 2.0000 | INHALATION_SPRAY | Freq: Four times a day (QID) | RESPIRATORY_TRACT | Status: DC | PRN

## 2012-04-18 MED ORDER — LISINOPRIL-HYDROCHLOROTHIAZIDE 20-12.5 MG PO TABS: 2.0000 | ORAL_TABLET | Freq: Every day | ORAL | Status: DC

## 2012-04-18 MED ORDER — ACETAMINOPHEN 10 MG/ML IV SOLN
1000.0000 mg | Freq: Four times a day (QID) | INTRAVENOUS | Status: AC
  Administered 2012-04-18 – 2012-04-19 (×3): 1000 mg via INTRAVENOUS
  Filled 2012-04-18 (×5): qty 100

## 2012-04-18 MED ORDER — MORPHINE SULFATE (PF) 1 MG/ML IV SOLN
INTRAVENOUS | Status: DC
  Administered 2012-04-18 (×2): via INTRAVENOUS
  Administered 2012-04-18: 3 mg via INTRAVENOUS
  Administered 2012-04-19: 10.6 mg via INTRAVENOUS
  Administered 2012-04-19: 7.9 mg via INTRAVENOUS
  Administered 2012-04-19: 22:00:00 via INTRAVENOUS
  Administered 2012-04-19: 3 mg via INTRAVENOUS
  Administered 2012-04-19: 15 mg via INTRAVENOUS
  Administered 2012-04-20: 4.6 mg via INTRAVENOUS
  Administered 2012-04-20: 11.41 mg via INTRAVENOUS
  Administered 2012-04-20: 7.5 mg via INTRAVENOUS
  Filled 2012-04-18 (×3): qty 25

## 2012-04-18 MED ORDER — MESALAMINE ER 250 MG PO CPCR
1000.0000 mg | ORAL_CAPSULE | Freq: Three times a day (TID) | ORAL | Status: DC
  Administered 2012-04-18 – 2012-04-20 (×6): 1000 mg via ORAL
  Filled 2012-04-18 (×7): qty 4

## 2012-04-18 MED ORDER — HYDROCODONE-ACETAMINOPHEN 5-325 MG PO TABS
1.0000 | ORAL_TABLET | ORAL | Status: DC | PRN
  Administered 2012-04-18 – 2012-04-20 (×3): 2 via ORAL
  Filled 2012-04-18 (×2): qty 2

## 2012-04-18 MED ORDER — ATORVASTATIN CALCIUM 10 MG PO TABS
10.0000 mg | ORAL_TABLET | Freq: Every day | ORAL | Status: DC
  Administered 2012-04-18 – 2012-04-20 (×3): 10 mg via ORAL
  Filled 2012-04-18 (×3): qty 1

## 2012-04-18 MED ORDER — SODIUM CHLORIDE 0.9 % IJ SOLN: 9.0000 mL | INTRAMUSCULAR | Status: DC | PRN

## 2012-04-18 SURGICAL SUPPLY — 76 items
APL SKNCLS STERI-STRIP NONHPOA (GAUZE/BANDAGES/DRESSINGS) ×1
BENZOIN TINCTURE PRP APPL 2/3 (GAUZE/BANDAGES/DRESSINGS) ×2 IMPLANT
BLADE SAW SAG 73X25 THK (BLADE) ×1
BLADE SAW SGTL 73X25 THK (BLADE) ×1 IMPLANT
BLADE SURG 10 STRL SS (BLADE) ×1 IMPLANT
BLADE SURG ROTATE 9660 (MISCELLANEOUS) IMPLANT
BRUSH FEMORAL CANAL (MISCELLANEOUS) IMPLANT
CLOTH BEACON ORANGE TIMEOUT ST (SAFETY) ×2 IMPLANT
COVER BACK TABLE 24X17X13 BIG (DRAPES) ×2 IMPLANT
COVER SURGICAL LIGHT HANDLE (MISCELLANEOUS) ×1 IMPLANT
DRAPE INCISE IOBAN 66X45 STRL (DRAPES) ×1 IMPLANT
DRAPE ORTHO SPLIT 77X108 STRL (DRAPES) ×4
DRAPE PROXIMA HALF (DRAPES) ×1 IMPLANT
DRAPE SURG ORHT 6 SPLT 77X108 (DRAPES) ×2 IMPLANT
DRAPE U-SHAPE 47X51 STRL (DRAPES) ×2 IMPLANT
DRILL BIT 7/64X5 (BIT) ×2 IMPLANT
DRSG PAD ABDOMINAL 8X10 ST (GAUZE/BANDAGES/DRESSINGS) ×4 IMPLANT
DURAPREP 26ML APPLICATOR (WOUND CARE) ×2 IMPLANT
ELECT BLADE 4.0 EZ CLEAN MEGAD (MISCELLANEOUS) ×2
ELECT CAUTERY BLADE 6.4 (BLADE) ×2 IMPLANT
ELECT REM PT RETURN 9FT ADLT (ELECTROSURGICAL) ×2
ELECTRODE BLDE 4.0 EZ CLN MEGD (MISCELLANEOUS) IMPLANT
ELECTRODE REM PT RTRN 9FT ADLT (ELECTROSURGICAL) ×1 IMPLANT
EVACUATOR 1/8 PVC DRAIN (DRAIN) ×1 IMPLANT
FACESHIELD LNG OPTICON STERILE (SAFETY) ×5 IMPLANT
GAUZE XEROFORM 5X9 LF (GAUZE/BANDAGES/DRESSINGS) ×2 IMPLANT
GLOVE BIOGEL PI IND STRL 7.5 (GLOVE) ×1 IMPLANT
GLOVE BIOGEL PI IND STRL 8 (GLOVE) ×1 IMPLANT
GLOVE BIOGEL PI INDICATOR 7.5 (GLOVE) ×1
GLOVE BIOGEL PI INDICATOR 8 (GLOVE) ×1
GLOVE ECLIPSE 7.0 STRL STRAW (GLOVE) ×2 IMPLANT
GLOVE ORTHO TXT STRL SZ7.5 (GLOVE) ×3 IMPLANT
GOWN PREVENTION PLUS LG XLONG (DISPOSABLE) IMPLANT
GOWN PREVENTION PLUS XLARGE (GOWN DISPOSABLE) ×3 IMPLANT
GOWN STRL NON-REIN LRG LVL3 (GOWN DISPOSABLE) ×2 IMPLANT
HANDPIECE INTERPULSE COAX TIP (DISPOSABLE)
HOOD PEEL AWAY FACE SHEILD DIS (HOOD) ×1 IMPLANT
IMMOBILIZER KNEE 20 (SOFTGOODS)
IMMOBILIZER KNEE 20 THIGH 36 (SOFTGOODS) IMPLANT
IMMOBILIZER KNEE 22 UNIV (SOFTGOODS) ×1 IMPLANT
IMMOBILIZER KNEE 24 THIGH 36 (MISCELLANEOUS) IMPLANT
IMMOBILIZER KNEE 24 UNIV (MISCELLANEOUS)
KIT BASIN OR (CUSTOM PROCEDURE TRAY) ×2 IMPLANT
KIT ROOM TURNOVER OR (KITS) ×2 IMPLANT
MANIFOLD NEPTUNE II (INSTRUMENTS) ×2 IMPLANT
NDL 1/2 CIR MAYO (NEEDLE) ×1 IMPLANT
NDL HYPO 25GX1X1/2 BEV (NEEDLE) ×1 IMPLANT
NEEDLE 1/2 CIR MAYO (NEEDLE) ×2 IMPLANT
NEEDLE HYPO 25GX1X1/2 BEV (NEEDLE) ×2 IMPLANT
NEEDLE MAYO .5 CIRCLE (NEEDLE) ×2 IMPLANT
NS IRRIG 1000ML POUR BTL (IV SOLUTION) ×2 IMPLANT
PACK TOTAL JOINT (CUSTOM PROCEDURE TRAY) ×2 IMPLANT
PAD ARMBOARD 7.5X6 YLW CONV (MISCELLANEOUS) ×4 IMPLANT
PIN STEINMAN 3/16 (PIN) ×2 IMPLANT
PRESSURIZER FEMORAL UNIV (MISCELLANEOUS) IMPLANT
SET HNDPC FAN SPRY TIP SCT (DISPOSABLE) IMPLANT
SPONGE GAUZE 4X4 12PLY (GAUZE/BANDAGES/DRESSINGS) ×2 IMPLANT
SPONGE LAP 4X18 X RAY DECT (DISPOSABLE) ×3 IMPLANT
STAPLER VISISTAT 35W (STAPLE) ×2 IMPLANT
STRIP CLOSURE SKIN 1/2X4 (GAUZE/BANDAGES/DRESSINGS) ×2 IMPLANT
SUCTION FRAZIER TIP 10 FR DISP (SUCTIONS) ×2 IMPLANT
SUT ETHIBOND NAB CT1 #1 30IN (SUTURE) ×6 IMPLANT
SUT TICRON (SUTURE) ×4 IMPLANT
SUT VIC AB 0 CT1 27 (SUTURE)
SUT VIC AB 0 CT1 27XBRD ANBCTR (SUTURE) IMPLANT
SUT VIC AB 2-0 CT1 27 (SUTURE) ×4
SUT VIC AB 2-0 CT1 TAPERPNT 27 (SUTURE) ×2 IMPLANT
SUT VICRYL 0 TIES 12 18 (SUTURE) IMPLANT
SUT VICRYL 4-0 PS2 18IN ABS (SUTURE) ×2 IMPLANT
SYR CONTROL 10ML LL (SYRINGE) ×2 IMPLANT
TAPE CLOTH SURG 6X10 WHT LF (GAUZE/BANDAGES/DRESSINGS) ×1 IMPLANT
TOWEL OR 17X24 6PK STRL BLUE (TOWEL DISPOSABLE) ×2 IMPLANT
TOWEL OR 17X26 10 PK STRL BLUE (TOWEL DISPOSABLE) ×2 IMPLANT
TOWER CARTRIDGE SMART MIX (DISPOSABLE) IMPLANT
TRAY FOLEY CATH 14FR (SET/KITS/TRAYS/PACK) ×1 IMPLANT
WATER STERILE IRR 1000ML POUR (IV SOLUTION) ×7 IMPLANT

## 2012-04-18 NOTE — H&P (Signed)
[name] is an 46 y.o. female.   Chief Complaint: Right hip OA    HPI: 46 yo with progressive right hip OA , failed conservative tx with walking aids, NSAIDS, intra-artic injections with relief.   xrays marginal osteophytes, sclerosis , loss of joint space.   Past Medical History  Diagnosis Date  . Crohn disease   . 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 High cholesterol   . Diabetes mellitus     diet controlled  . Hypertension     pcp  Dr Aurther Loft Reuel Boom    [place]  . Asthma   . Anxiety   . Sleep apnea 03-2009 study    pt forgot to mention at pre admit visit.  uses c-pap brought her own  machine from home.     Past Surgical History  Procedure Date  . Tonsillectomy   . Cyst removed from left hand     History reviewed. No pertinent family history. Social History:  reports that she has never smoked. She does not have any smokeless tobacco history on file. She reports that she does not drink alcohol or use illicit drugs.  Allergies:  Allergies  Allergen Reactions  . Remicade (Infliximab) Swelling    Joints locked up  . Cefprozil Rash  . Penicillins Rash    Medications Prior to Admission  Medication Sig Dispense Refill  . ALPRAZolam (XANAX) 0.5 MG tablet Take 0.5 mg by mouth at bedtime as needed. For sleep      . cetirizine (ZYRTEC) 10 MG tablet Take 10 mg by mouth daily.      . citalopram (CELEXA) 20 MG tablet Take 20 mg by mouth daily.      Marland Kitchen diltiazem (CARDIZEM CD) 240 MG 24 hr capsule Take 240 mg by mouth daily.      . fish oil-omega-3 fatty acids 1000 MG capsule Take 2 g by mouth daily.      . Fluticasone-Salmeterol (ADVAIR) 250-50 MCG/DOSE AEPB Inhale 1 puff into the lungs daily.      Marland Kitchen lisinopril-hydrochlorothiazide (PRINZIDE,ZESTORETIC) 20-12.5 MG per tablet Take 2 tablets by mouth daily.      . mercaptopurine (PURINETHOL) 50 MG tablet Take 50 mg by mouth daily. Give on an empty stomach 1 hour before or 2 hours after meals. Caution: Chemotherapy.      .  Multiple Vitamin (MULITIVITAMIN WITH MINERALS) TABS Take 1 tablet by mouth daily.      Marland Kitchen PENTASA 500 MG CR capsule TAKE 2 CAPSULES BY MOUTH FOUR TIMES DAILY  240 capsule  11  . rosuvastatin (CRESTOR) 5 MG tablet Take 5 mg by mouth daily.      Marland Kitchen albuterol (PROVENTIL,VENTOLIN) 90 MCG/ACT inhaler Inhale 2 puffs into the lungs every 6 (six) hours as needed. Shortness of breath      . medroxyPROGESTERone (DEPO-PROVERA) 150 MG/ML injection Inject 150 mg into the muscle every 3 (three) months.      . mercaptopurine (PURINETHOL) 50 MG tablet TAKE 1 TABLET BY MOUTH TWICE DAILY  60 tablet  5    Results for orders placed during the hospital encounter of 04/18/12 (from the past 48 hour(s))  GLUCOSE, CAPILLARY     Status: Abnormal   Collection Time   04/18/12 11:50 AM      Component Value Range Comment   Glucose-Capillary 113 (*) 70 - 99 mg/dL   CBC     Status: Normal   Collection Time   04/18/12 12:13 PM      Component  Value Range Comment   WBC 7.3  4.0 - 10.5 K/uL    RBC 3.92  3.87 - 5.11 MIL/uL    Hemoglobin 13.2  12.0 - 15.0 g/dL    HCT 16.1  09.6 - 04.5 %    MCV 98.2  78.0 - 100.0 fL    MCH 33.7  26.0 - 34.0 pg    MCHC 34.3  30.0 - 36.0 g/dL    RDW 40.9  81.1 - 91.4 %    Platelets 226  150 - 400 K/uL   COMPREHENSIVE METABOLIC PANEL     Status: Abnormal   Collection Time   04/18/12 12:13 PM      Component Value Range Comment   Sodium 141  135 - 145 mEq/L    Potassium 3.7  3.5 - 5.1 mEq/L    Chloride 106  96 - 112 mEq/L    CO2 27  19 - 32 mEq/L    Glucose, Bld 113 (*) 70 - 99 mg/dL    BUN 13  6 - 23 mg/dL    Creatinine, Ser 7.82  0.50 - 1.10 mg/dL    Calcium 9.3  8.4 - 95.6 mg/dL    Total Protein 6.9  6.0 - 8.3 g/dL    Albumin 3.6  3.5 - 5.2 g/dL    AST 13  0 - 37 U/L    ALT 13  0 - 35 U/L    Alkaline Phosphatase 63  39 - 117 U/L    Total Bilirubin 0.9  0.3 - 1.2 mg/dL    GFR calc non Af Amer >90  >90 mL/min    GFR calc Af Amer >90  >90 mL/min    No results found.  Review of  Systems  Respiratory:       Asthma   Cardiovascular:       HTN   Gastrointestinal:       Pos.   Crohn's disease,  UC of rectum , cholesterol, DM  Genitourinary: Negative.   Musculoskeletal: Positive for joint pain.  Neurological: Negative.   Endo/Heme/Allergies: Negative.   Psychiatric/Behavioral: The patient is nervous/anxious.     Blood pressure 153/90, pulse 101, temperature 98.2 F (36.8 C), temperature source Oral, resp. rate 20, SpO2 96.00%. Physical Exam  Constitutional: She is oriented to person, place, and time. She appears well-developed and well-nourished.  HENT:  Head: Normocephalic and atraumatic.  Eyes: EOM are normal. Pupils are equal, round, and reactive to light.  Neck: Normal range of motion.  Cardiovascular: Normal rate and regular rhythm.   Respiratory: Effort normal and breath sounds normal.  GI: Soft.  Musculoskeletal:       Five degrees IR 30 ER right hip  . 5 degree hip flex contracture.  Intact pulses,  NVI  Neurological: She is alert and oriented to person, place, and time.  Skin: Skin is warm and dry.  Psychiatric: She has a normal mood and affect. Her behavior is normal. Judgment and thought content normal.     Assessment/Plan  Right hip OA for THA , procedure discussed, failed cons. Tx, all ?'s answered, she requests we proceed.    YATES,MARK C 04/18/2012, 3:35 PM

## 2012-04-18 NOTE — Preoperative (Signed)
Beta Blockers   Reason not to administer Beta Blockers:Not Applicable 

## 2012-04-18 NOTE — Anesthesia Preprocedure Evaluation (Addendum)
Anesthesia Evaluation  Patient identified by MRN, date of birth, ID band Patient awake    Reviewed: Allergy & Precautions, H&P , NPO status , Patient's Chart, lab work & pertinent test results, reviewed documented beta blocker date and time   History of Anesthesia Complications Negative for: history of anesthetic complications  Airway Mallampati: II TM Distance: >3 FB Neck ROM: full    Dental No notable dental hx. (+) Teeth Intact and Dental Advidsory Given   Pulmonary asthma (last albuterol a few months ago) , sleep apnea and Continuous Positive Airway Pressure Ventilation ,  breath sounds clear to auscultation  Pulmonary exam normal       Cardiovascular hypertension, On Medications Rhythm:regular Rate:Normal     Neuro/Psych Anxiety    GI/Hepatic Neg liver ROS, PUD,   Endo/Other  Well Controlled, Type obesity  Renal/GU negative Renal ROS     Musculoskeletal  (+) Arthritis -,   Abdominal (+) + obese,   Peds  Hematology   Anesthesia Other Findings   Reproductive/Obstetrics On Depo preg test NEG                         Anesthesia Physical Anesthesia Plan  ASA: III  Anesthesia Plan: General   Post-op Pain Management:    Induction: Intravenous  Airway Management Planned: Oral ETT  Additional Equipment:   Intra-op Plan:   Post-operative Plan: Extubation in OR  Informed Consent: I have reviewed the patients History and Physical, chart, labs and discussed the procedure including the risks, benefits and alternatives for the proposed anesthesia with the patient or authorized representative who has indicated his/her understanding and acceptance.   Dental Advisory Given  Plan Discussed with: CRNA, Anesthesiologist and Surgeon  Anesthesia Plan Comments: (Plan routine monitors, GETA )      Anesthesia Quick Evaluation

## 2012-04-18 NOTE — Transfer of Care (Signed)
Immediate Anesthesia Transfer of Care Note  Patient: [name]  Procedure(s) Performed: Procedure(s) (LRB): TOTAL HIP ARTHROPLASTY (Right)  Patient Location: PACU  Anesthesia Type: General  Level of Consciousness: awake, alert , oriented and patient cooperative  Airway & Oxygen Therapy: Patient Spontanous Breathing and Patient connected to face mask oxygen  Post-op Assessment: Report given to PACU RN  Post vital signs: Reviewed and stable  Complications: No apparent anesthesia complications

## 2012-04-18 NOTE — Brief Op Note (Signed)
04/18/2012  6:07 PM  PATIENT:  [name]  46 y.o. female  PRE-OPERATIVE DIAGNOSIS:  Osteoarthritis Right Hip  POST-OPERATIVE DIAGNOSIS:  Osteoarthritis Right Hip  PROCEDURE:  Procedure(s) (LRB): TOTAL HIP ARTHROPLASTY (Right)  SURGEON:  Surgeon(s) and Role:    * Eldred Manges, MD - Primary  PHYSICIAN ASSISTANT:   ASSISTANTS: Skip Mayer PA-C   ANESTHESIA:   general  EBL:  Total I/O In: 2000 [I.V.:2000] Out: 500 [Urine:100; Blood:400]  BLOOD ADMINISTERED:none  DRAINS: Penrose drain in the right hip   LOCAL MEDICATIONS USED:  NONE  SPECIMEN:  Source of Specimen:  right hip ball with OA full thickness cartilage wear  DISPOSITION OF SPECIMEN:  PATHOLOGY  COUNTS:  YES  TOURNIQUET:  * No tourniquets in log *  DICTATION: .Other Dictation: Dictation Number [id number]  PLAN OF CARE: Admit to inpatient   PATIENT DISPOSITION:  PACU - hemodynamically stable.   Delay start of Pharmacological VTE agent (>24hrs) due to surgical blood loss or risk of bleeding: yes

## 2012-04-18 NOTE — Anesthesia Postprocedure Evaluation (Signed)
Anesthesia Post Note  Patient: [name]  Procedure(s) Performed: Procedure(s) (LRB): TOTAL HIP ARTHROPLASTY (Right)  Anesthesia type: general  Patient location: PACU  Post pain: Pain level controlled  Post assessment: Patient's Cardiovascular Status Stable  Last Vitals:  Filed Vitals:   04/18/12 1939  BP:   Pulse:   Temp: 36.6 C  Resp:     Post vital signs: Reviewed and stable  Level of consciousness: sedated  Complications: No apparent anesthesia complications

## 2012-04-18 NOTE — Anesthesia Procedure Notes (Signed)
Procedure Name: Intubation Date/Time: 04/18/2012 4:02 PM Performed by: Gl[place]ora Score A Pre-anesthesia Checklist: Patient identified, Emergency Drugs available, Suction available and Patient being monitored Patient Re-evaluated:Patient Re-evaluated prior to inductionOxygen Delivery Method: Circle system utilized Preoxygenation: Pre-oxygenation with 100% oxygen Intubation Type: IV induction Ventilation: Mask ventilation without difficulty, Oral airway inserted - appropriate to patient size and Two handed mask ventilation required Tube type: Oral Tube size: 7.5 mm Number of attempts: 1 Airway Equipment and Method: Stylet and Video-laryngoscopy Placement Confirmation: ETT inserted through vocal cords under direct vision,  positive ETCO2 and breath sounds checked- equal and bilateral Secured at: 21 cm Tube secured with: Tape Dental Injury: Teeth and Oropharynx as per pre-operative assessment

## 2012-04-19 ENCOUNTER — Encounter (HOSPITAL_COMMUNITY): Payer: Self-pay | Admitting: Orthopaedic Surgery

## 2012-04-19 LAB — CBC
HCT: 33 % — ABNORMAL LOW (ref 36.0–46.0)
Hemoglobin: 11 g/dL — ABNORMAL LOW (ref 12.0–15.0)
MCH: 33.4 pg (ref 26.0–34.0)
MCHC: 33.3 g/dL (ref 30.0–36.0)
MCV: 100.3 fL — ABNORMAL HIGH (ref 78.0–100.0)
Platelets: 231 10*3/uL (ref 150–400)
RBC: 3.29 MIL/uL — ABNORMAL LOW (ref 3.87–5.11)
RDW: 12.3 % (ref 11.5–15.5)
WBC: 11.6 10*3/uL — ABNORMAL HIGH (ref 4.0–10.5)

## 2012-04-19 MED ORDER — MEDROXYPROGESTERONE ACETATE 150 MG/ML IM SUSP: 150.0000 mg | INTRAMUSCULAR | Status: DC

## 2012-04-19 NOTE — Op Note (Signed)
[name], [name] NO.:  [id number]  MEDICAL RECORD NO.:  [id number]  LOCATION:  [id number]                        FACILITY:  [facility]  PHYSICIAN:  Mark C. Ophelia Charter, M.D.    DATE OF BIRTH:  02/04/1966  DATE OF PROCEDURE:  04/18/2012 DATE OF DISCHARGE:                              OPERATIVE REPORT   PREOPERATIVE DIAGNOSIS:  Right hip osteoarthritis.  POSTOPERATIVE DIAGNOSIS:  Right hip osteoarthritis.  PROCEDURE:  Right total hip arthroplasty.  SURGEON:  Mark C. Ophelia Charter, MD  ASSISTANT:  Skip Mayer, PA-C  DRAINS:  One Hemovac.  COMPONENTS IMPLANTED:  DuoFix Summit 3 DePuy stem, +1 mm neck, 32 mm ceramic ball, pinnacle 100 series cup and apex hole eliminator, 15 mm OD acetabular shell.  PROCEDURE:  After induction of general anesthesia, the patient was placed in lateral position.  The patient had persistent increasing pain in her hip.  Failed injections, anti-inflammatories, modified weightbearing, walking aids and has had progressive osteoarthritis by MRI with no evidence of infection or rheumatologic inflammatory arthritis.  The patient was then intubated, placed in lateral position with axillary roll, right hip up.  Standard prepping with DuraPrep. Ancef 3 g was given prophylactically.  Usual sterile hip sheets, draped impervious stockinette and Coban, followed by Betadine and Steri-Drape after sterile skin marker.  Time-out procedure was completed.  Posterior hip incision was made.  The patient had elevated BMIs, morbidly obese and 8- 10 inches of adipose tissue was divided until the gluteus maximus fascia and the trochanteric bursa was encountered.  Tensor fascia was nicked, opened in line with gluteus maximus fibers.  The Charnley retractor barely grasped a small amount of the muscle.  Piriformis was very enlarged and was tagged, then cut.  Posterior capsule was opened up. There is immediately a large amount of serous clear fluid out of the hip joint.   Posterior capsulectomy was performed.  Nerve was palpated and was carefully protected.  Hip was dislocated.  Neck was cut initially, slightly more than a fingerbreadth and then canal starter was progressing, it was apparent the next too long that was shortened, that was 1 fingerbreadth above the lesser trochanter.  Sequential reaming followed by broaching up to 3 size which gave an excellent canal fit. The lateralizer, cookie cutter was used.  On the acetabular side, sequential reaming up to 49 was performed.  Trial showed excellent tight fit.  A 50 cup was inserted.  Bone was reamed down with about 1-2 mm left in the fovea and exposed bleeding bone.  Labrum was degenerative, barely visible posteriorly, then diminutive anteriorly.  The ball that had been excised was sent to pathology and had a 1 x 3 cm ellipse groove where the weightbearing cartilage had worn down to subchondral bone. Medial aspect that had looked good.  There is corresponding changes on the acetabular side.  Trials were inserted, and a +1 mm gave excellent tight fit.  It to be flexed to 90, internally rotated 85 degrees with squeak, and x-ray was taken, cross-table lateral looking at the neck length and a line drawn.  We brought lesser trochanters through the tip of the ischial tuberosity, showed restoration of identical leg lengths. Already, stem and pin  had been placed in the pelvis and then drill bit pounded into the greater trochanter parallel.  At the end of the case, it was in1 mm preoperative lengths when she was leveled.  The permanent prosthesis was inserted.  Apex hole eliminator, permanent poly, followed by stem and drying the trunnion and then the ball.  Identical findings of excellent stability of the ball was ceramic ball, Summit #3 stem was used and the geographic porous coating was exactly at the cut of the neck.  After reduction of the hip, sciatic nerve was again checked.  It was in good position.   Piriformis repaired back to gluteus medius, interrupted #1 Ethibond sutures in the tensor fascia and gluteus maximus fascia, 2-0 Vicryl, several layers in the subcutaneous tissue, adipose layer.  A Hemovac drain was placed through separate stab incisions prior to closure, and skin staples were used followed by a postop dressing and a knee immobilizer.  The patient tolerated the procedure well and transferred to the recovery room in stable condition.     Mark C. Ophelia Charter, M.D.     MCY/MEDQ  D:  04/18/2012  T:  04/19/2012  Job:  [id number]

## 2012-04-19 NOTE — Progress Notes (Signed)
Initial review for inpatient status is complete. 

## 2012-04-19 NOTE — Evaluation (Signed)
Physical Therapy Evaluation Patient Details Name: [name] MRN: [id number] DOB: [birth year] Today's Date: 04/19/2012 Time: 7829-5621 PT Time Calculation (min): 24 min  PT Assessment / Plan / Recommendation Clinical Impression  Pt is a 46 y.o. female s/p right THA.  Patient is pleasent and cooperative.  Pt demonstrates deficits in functional mobility secondary to weakness, pain and decreased activity tolerance.  Pt will continue to benefit from skilled physical therapy to imporve overall functional mobility and maximize independence for d/c home.    PT Assessment  Patient needs continued PT services    Follow Up Recommendations  Home health PT    Barriers to Discharge        Equipment Recommendations  Rolling walker with 5" wheels;3 in 1 bedside comode    Recommendations for Other Services     Frequency 7X/week    Precautions / Restrictions Precautions Precautions: Posterior Hip Precaution Booklet Issued: Yes (comment) Required Braces or Orthoses: Knee Immobilizer - Right Restrictions Weight Bearing Restrictions: Yes Other Position/Activity Restrictions: WBAT   Pertinent Vitals/Pain 6/10      Mobility  Bed Mobility Bed Mobility: Supine to Sit;Sitting - Scoot to Edge of Bed Supine to Sit: 3: Mod assist;4: Min assist Sitting - Scoot to [place] of Bed: 4: Min assist Details for Bed Mobility Assistance: Min Assist for LE; mod assist to rotate trunk Transfers Transfers: Sit to Stand;Stand to Sit Sit to Stand: 4: Min assist Stand to Sit: 4: Min guard Details for Transfer Assistance: VC's for body positioning and hand placement Ambulation/Gait Ambulation/Gait Assistance: 4: Min guard Ambulation Distance (Feet): 6 Feet Assistive device: Rolling walker Ambulation/Gait Assistance Details: VC's for sequencing Gait Pattern: Step-to pattern;Decreased stride length;Antalgic Gait velocity: decreased    Exercises Total Joint Exercises Ankle Circles/Pumps: AROM;Both;20  reps   PT Diagnosis: Difficulty walking;Abnormality of gait;Acute pain;Generalized weakness  PT Problem List: Decreased strength;Decreased activity tolerance;Decreased range of motion;Decreased mobility;Decreased knowledge of use of DME;Obesity;Pain PT Treatment Interventions: DME instruction;Stair training;Gait training;Functional mobility training;Therapeutic activities;Therapeutic exercise;Patient/family education   PT Goals Acute Rehab PT Goals PT Goal Formulation: With patient Time For Goal Achievement: 04/26/12 Potential to Achieve Goals: Good Pt will go Sit to Stand: with modified independence PT Goal: Sit to Stand - Progress: Goal set today Pt will Ambulate: >150 feet;with modified independence;with rolling walker PT Goal: Ambulate - Progress: Goal set today Pt will Go Up / Down Stairs: 3-5 stairs;with rolling walker;with min assist PT Goal: Up/Down Stairs - Progress: Goal set today Pt will Perform Home Exercise Program: Independently PT Goal: Perform Home Exercise Program - Progress: Goal set today  Visit Information  Last PT Received On: 04/19/12 Assistance Needed: +2    Subjective Data  Subjective: I was able to get up to use the commode this am Patient Stated Goal: to go home   Prior Functioning  Home Living Lives With: Alone Available Help at Discharge: Family Type of Home: House Home Access: Stairs to enter Secretary/administrator of Steps: 2 Entrance Stairs-Rails: None Home Layout: One level Bathroom Shower/Tub: Health visitor: Standard Home Adaptive Equipment: Straight cane Prior Function Level of Independence: Independent with assistive device(s) Able to Take Stairs?: Yes Driving: Yes Vocation: Full time employment Comments: teacher    Cognition  Overall Cognitive Status: Appears within functional limits for tasks assessed/performed Arousal/Alertness: Awake/alert Orientation Level: Oriented X4 / Intact;Appears intact for tasks  assessed Behavior During Session: [facility] for tasks performed    Extremity/Trunk Assessment Right Upper Extremity Assessment RUE ROM/Strength/Tone: [facility] for tasks assessed  Left Upper Extremity Assessment LUE ROM/Strength/Tone: [facility] for tasks assessed Right Lower Extremity Assessment RLE ROM/Strength/Tone: Deficits;Unable to fully assess;Due to pain;Due to precautions Left Lower Extremity Assessment LLE ROM/Strength/Tone: [facility] for tasks assessed   Balance    End of Session PT - End of Session Equipment Utilized During Treatment: Gait belt;Right knee immobilizer Activity Tolerance: Patient tolerated treatment well;Patient limited by pain;Patient limited by fatigue Patient left: in chair;with call bell/phone within reach Nurse Communication: Mobility status  GP     Fabio Asa 04/19/2012, 10:24 AM Charlotte Crumb, PT DPT  [phone]

## 2012-04-19 NOTE — Progress Notes (Signed)
Subjective: 1 Day Post-Op Procedure(s) (LRB): TOTAL HIP ARTHROPLASTY (Right) Patient reports pain as moderate.    Objective: Vital signs in last 24 hours: Temp:  [97.5 F (36.4 C)-98.8 F (37.1 C)] 98 F (36.7 C) (08/20 0541) Pulse Rate:  [87-101] 90  (08/20 0541) Resp:  [11-20] 18  (08/20 0541) BP: (105-153)/(50-90) 132/68 mmHg (08/20 0541) SpO2:  [95 %-100 %] 95 % (08/20 0541) FiO2 (%):  [28 %] 28 % (08/19 2006)  Intake/Output from previous day: 08/19 0701 - 08/20 0700 In: 2500 [I.V.:2500] Out: 500 [Urine:100; Blood:400] Intake/Output this shift:     Basename 04/19/12 0620 04/18/12 1213  HGB 11.0* 13.2    Basename 04/19/12 0620 04/18/12 1213  WBC 11.6* 7.3  RBC 3.29* 3.92  HCT 33.0* 38.5  PLT 231 226    Basename 04/18/12 1213  NA 141  K 3.7  CL 106  CO2 27  BUN 13  CREATININE 0.59  GLUCOSE 113*  CALCIUM 9.3   No results found for this basename: LABPT:2,INR:2 in the last 72 hours  Neurologically intact  Assessment/Plan: 1 Day Post-Op Procedure(s) (LRB): TOTAL HIP ARTHROPLASTY (Right) Up with therapy  YATES,MARK C 04/19/2012, 7:22 AM

## 2012-04-19 NOTE — Progress Notes (Signed)
Physical Therapy Treatment Patient Details Name: [name] MRN: [id number] DOB: 04-04-1966 Today's Date: 04/19/2012 Time: 7829-5621 PT Time Calculation (min): 25 min  PT Assessment / Plan / Recommendation Comments on Treatment Session  Pt is making progress towards PT goals and was able to increase activity tolerance.  Pt will continue to benefit from skilled PT to perform stair negotiation and maximize functional mobility for indepence upon discharge.    Follow Up Recommendations  Home health PT    Barriers to Discharge        Equipment Recommendations  Rolling walker with 5" wheels;3 in 1 bedside comode    Recommendations for Other Services    Frequency 7X/week   Plan Discharge plan remains appropriate    Precautions / Restrictions Precautions Precautions: Posterior Hip Precaution Booklet Issued: Yes (comment) Required Braces or Orthoses: Knee Immobilizer - Right Restrictions Weight Bearing Restrictions: Yes Other Position/Activity Restrictions: WBAT   Pertinent Vitals/Pain 6/10    Mobility  Transfers Transfers: Sit to Stand;Stand to Sit Sit to Stand: 4: Min assist;4: Min guard Stand to Sit: 4: Min guard Details for Transfer Assistance: Assistance require when lowering footrest of chair for RLE Ambulation/Gait Ambulation/Gait Assistance: 4: Min guard Ambulation Distance (Feet): 55 Feet Assistive device: Rolling walker Ambulation/Gait Assistance Details: VCs for sequencing and upright posture Gait Pattern: Step-to pattern;Decreased stride length;Antalgic Gait velocity: decreased General Gait Details: Pt became mildly fatigued and diaphoretic during ambulation     PT Goals Acute Rehab PT Goals PT Goal Formulation: With patient Time For Goal Achievement: 04/26/12 Potential to Achieve Goals: Good Pt will go Sit to Stand: with modified independence PT Goal: Sit to Stand - Progress: Progressing toward goal Pt will Ambulate: >150 feet;with modified  independence;with rolling walker PT Goal: Ambulate - Progress: Progressing toward goal  Visit Information  Last PT Received On: 04/19/12 Assistance Needed: +1    Subjective Data  Subjective: I have been up and down to use the bathroom a few times so I am a little sore Patient Stated Goal: to go home   Cognition  Overall Cognitive Status: Appears within functional limits for tasks assessed/performed Arousal/Alertness: Awake/alert Orientation Level: Oriented X4 / Intact;Appears intact for tasks assessed Behavior During Session: [facility] for tasks performed    Balance     End of Session PT - End of Session Equipment Utilized During Treatment: Gait belt;Right knee immobilizer Activity Tolerance: Patient tolerated treatment well;Patient limited by pain;Patient limited by fatigue Patient left: in chair;with call bell/phone within reach Nurse Communication: Mobility status   GP     Fabio Asa 04/19/2012, 4:14 PM

## 2012-04-20 DIAGNOSIS — M1611 Unilateral primary osteoarthritis, right hip: Secondary | ICD-10-CM

## 2012-04-20 LAB — CBC
HCT: 31.1 % — ABNORMAL LOW (ref 36.0–46.0)
Hemoglobin: 10.5 g/dL — ABNORMAL LOW (ref 12.0–15.0)
MCH: 34 pg (ref 26.0–34.0)
MCHC: 33.8 g/dL (ref 30.0–36.0)
MCV: 100.6 fL — ABNORMAL HIGH (ref 78.0–100.0)
Platelets: 206 10*3/uL (ref 150–400)
RBC: 3.09 MIL/uL — ABNORMAL LOW (ref 3.87–5.11)
RDW: 12.3 % (ref 11.5–15.5)
WBC: 11.1 10*3/uL — ABNORMAL HIGH (ref 4.0–10.5)

## 2012-04-20 MED ORDER — METHOCARBAMOL 500 MG PO TABS: 500.0000 mg | ORAL_TABLET | Freq: Four times a day (QID) | ORAL | Status: AC | PRN

## 2012-04-20 MED ORDER — HYDROCODONE-ACETAMINOPHEN 5-325 MG PO TABS: 2.0000 | ORAL_TABLET | ORAL | Status: AC | PRN

## 2012-04-20 MED ORDER — ASPIRIN 325 MG PO TBEC: 325.0000 mg | DELAYED_RELEASE_TABLET | Freq: Every day | ORAL | Status: AC

## 2012-04-20 NOTE — Progress Notes (Addendum)
PT Cancellation Note  Treatment cancelled today due to patient's refusal to participate this am secondary to what patient states was a very long and exhausting night with no sleep; will attempt to see again this pm for stair training.  Fabio Asa 04/20/2012, 4:10 PM Charlotte Crumb, PT DPT  [phone]

## 2012-04-20 NOTE — Discharge Summary (Signed)
Physician Discharge Summary  Patient ID: [name] MRN: [id number] DOB/AGE: 46/20/1967 45 y.o.  Admit date: 04/18/2012 Discharge date: 04/20/2012  Admission Diagnoses:  Right hip OA  Discharge Diagnoses: right hip OA Active Problems:  Osteoarthritis of right hip  obesity  Increased BMI    Discharged Condition: good  Hospital Course: admitted after informed consent and underwent right THA.  Post op course, PT, OT, ASA and TEDS, and SCD's for DVT prophylaxis.  Completed therapy goals, pain controlled, incision looks. Good.   Consults: None  Significant Diagnostic Studies: radiology:  Post op xrays good P and A. LL equal.   Treatments: surgery: right THA  depuy  Ceramic on poly  Discharge Exam: Blood pressure 112/55, pulse 99, temperature 98.6 F (37 C), temperature source Oral, resp. rate 18, height 5\' 3"  (1.6 m), weight 131.543 kg (290 lb), SpO2 94.00%.   Disposition:  Home.     Medication List  As of 04/20/2012  8:46 PM   TAKE these medications         albuterol 90 MCG/ACT inhaler   Commonly known as: PROVENTIL,VENTOLIN   Inhale 2 puffs into the lungs every 6 (six) hours as needed. Shortness of breath      ALPRAZolam 0.5 MG tablet   Commonly known as: XANAX   Take 0.5 mg by mouth at bedtime as needed. For sleep      aspirin 325 MG EC tablet   Take 1 tablet (325 mg total) by mouth daily.      cetirizine 10 MG tablet   Commonly known as: ZYRTEC   Take 10 mg by mouth daily.      citalopram 20 MG tablet   Commonly known as: CELEXA   Take 20 mg by mouth daily.      diltiazem 240 MG 24 hr capsule   Commonly known as: CARDIZEM CD   Take 240 mg by mouth daily.      fish oil-omega-3 fatty acids 1000 MG capsule   Take 2 g by mouth daily.      Fluticasone-Salmeterol 250-50 MCG/DOSE Aepb   Commonly known as: ADVAIR   Inhale 1 puff into the lungs daily.      HYDROcodone-acetaminophen 5-325 MG per tablet   Commonly known as: NORCO/VICODIN   Take 2 tablets  by mouth every 4 (four) hours as needed (breakthrough pain).      lisinopril-hydrochlorothiazide 20-12.5 MG per tablet   Commonly known as: PRINZIDE,ZESTORETIC   Take 2 tablets by mouth daily.      medroxyPROGESTERone 150 MG/ML injection   Commonly known as: DEPO-PROVERA   Inject 150 mg into the muscle every 3 (three) months.      mercaptopurine 50 MG tablet   Commonly known as: PURINETHOL   TAKE 1 TABLET BY MOUTH TWICE DAILY      mercaptopurine 50 MG tablet   Commonly known as: PURINETHOL   Take 50 mg by mouth daily. Give on an empty stomach 1 hour before or 2 hours after meals. Caution: Chemotherapy.      methocarbamol 500 MG tablet   Commonly known as: ROBAXIN   Take 1 tablet (500 mg total) by mouth every 6 (six) hours as needed.      multivitamin with minerals Tabs   Take 1 tablet by mouth daily.      PENTASA 500 MG CR capsule   Generic drug: mesalamine   TAKE 2 CAPSULES BY MOUTH FOUR TIMES DAILY      rosuvastatin 5 MG tablet  Commonly known as: CRESTOR   Take 5 mg by mouth daily.           Follow-up Information    Follow up with YATES,MARK C, MD in 8 days. [facility] [place] clinic to see Ophelia Charter)    Contact information:   [facility] [address] [place] [address] [phone]          Signed: Eldred Manges 04/20/2012, 8:46 PM

## 2012-04-20 NOTE — Progress Notes (Signed)
Subjective: 2 Days Post-Op Procedure(s) (LRB): TOTAL HIP ARTHROPLASTY (Right) Patient reports pain as mild.    Objective: Vital signs in last 24 hours: Temp:  [97.9 F (36.6 C)-100.7 F (38.2 C)] 98.6 F (37 C) (08/21 0546) Pulse Rate:  [98-99] 99  (08/21 0546) Resp:  [16-20] 16  (08/21 0546) BP: (112-128)/(43-59) 112/55 mmHg (08/21 0546) SpO2:  [93 %-100 %] 100 % (08/21 0546)  Intake/Output from previous day: 08/20 0701 - 08/21 0700 In: 960 [P.O.:960] Out: 1850 [Urine:1850] Intake/Output this shift:     Basename 04/19/12 0620 04/18/12 1213  HGB 11.0* 13.2    Basename 04/19/12 0620 04/18/12 1213  WBC 11.6* 7.3  RBC 3.29* 3.92  HCT 33.0* 38.5  PLT 231 226    Basename 04/18/12 1213  NA 141  K 3.7  CL 106  CO2 27  BUN 13  CREATININE 0.59  GLUCOSE 113*  CALCIUM 9.3   No results found for this basename: LABPT:2,INR:2 in the last 72 hours  Neurologically intact  Assessment/Plan: 2 Days Post-Op Procedure(s) (LRB): TOTAL HIP ARTHROPLASTY (Right) Up with therapy      3 in 1 ordered for home, rolling folding walker with 5 inch front wheels  YATES,MARK C 04/20/2012, 7:43 AM

## 2012-04-20 NOTE — Progress Notes (Signed)
Physical Therapy Treatment Patient Details Name: DETRIA CUMMINGS MRN: [id number] DOB: 01/25/1966 Today's Date: 04/20/2012 Time: 1410-1441 PT Time Calculation (min): 31 min  PT Assessment / Plan / Recommendation Comments on Treatment Session  Patient continuing to progress with therapy and able to complete stair training. Patient stated that she felt safe enough with assistance at home to DC safely home.     Follow Up Recommendations  Home health PT    Barriers to Discharge        Equipment Recommendations  Rolling walker with 5" wheels;3 in 1 bedside comode    Recommendations for Other Services    Frequency 7X/week   Plan Discharge plan remains appropriate;Frequency remains appropriate    Precautions / Restrictions Precautions Precautions: Posterior Hip Precaution Booklet Issued: Yes (comment) Precaution Comments: Patient able to recall all precautions.  Restrictions RLE Weight Bearing: Weight bearing as tolerated   Pertinent Vitals/Pain     Mobility  Bed Mobility Bed Mobility: Not assessed Transfers Sit to Stand: 4: Min guard;With upper extremity assist;With armrests;From chair/3-in-1 Stand to Sit: 4: Min guard;With upper extremity assist;With armrests;To chair/3-in-1 Details for Transfer Assistance: MinGuard for safety Ambulation/Gait Ambulation/Gait Assistance: 4: Min guard Ambulation Distance (Feet): 400 Feet Assistive device: Rolling walker Ambulation/Gait Assistance Details: Cues for gait sequence Gait Pattern: Step-through pattern;Decreased stride length Stairs: Yes Stairs Assistance: 4: Min assist Stair Management Technique: No rails;Step to pattern;Backwards;With walker Number of Stairs: 2     Exercises     PT Diagnosis:    PT Problem List:   PT Treatment Interventions:     PT Goals Acute Rehab PT Goals PT Goal: Sit to Stand - Progress: Progressing toward goal PT Goal: Ambulate - Progress: Progressing toward goal PT Goal: Up/Down Stairs -  Progress: Progressing toward goal  Visit Information  Last PT Received On: 04/20/12 Assistance Needed: +1    Subjective Data      Cognition  Overall Cognitive Status: Appears within functional limits for tasks assessed/performed Arousal/Alertness: Awake/alert Orientation Level: Appears intact for tasks assessed Behavior During Session: [facility] for tasks performed    Balance     End of Session PT - End of Session Equipment Utilized During Treatment: Gait belt Activity Tolerance: Patient tolerated treatment well Patient left: in chair;with call bell/phone within reach Nurse Communication: Mobility status   GP     Fredrich Birks 04/20/2012, 2:52 PM 04/20/2012 Fredrich Birks PTA [phone] pager [phone] office

## 2012-04-20 NOTE — Progress Notes (Signed)
Patient ID: [name], female   DOB: 05/31/1966, 46 y.o.   MRN: [id number] wgt 295  Lbs and 134 kg.  mobidly obese.  Surgery closure time lengthened and therapy modified due to increased BMI.    Patient ready for discharge home. Vicodin, robaxin . Office follow up 2 wks post op in [place] office to see Dr. Ophelia Charter

## 2012-04-20 NOTE — Progress Notes (Signed)
Patient given discharge instructions and scripts with teachback method used. Patient is aware of follow up appointment with MD in 8 days. Patient in stable condition with no changes at this time. Patient wheeled out via wheelchair to go home with husband at 2135p.

## 2012-04-20 NOTE — Progress Notes (Signed)
Referral received for SNF. Chart reviewed and CSW has spoken with RNCM who indicates that patient is for DC to home with DME needs only.  SNF is not indicated for this patient.  CSW to sign off. Please re-consult if CSW needs arise.  Lorri Frederick. [place]  [phone]

## 2012-04-20 NOTE — Care Management Note (Signed)
    Page 1 of 2   04/20/2012     12:14:26 PM   CARE MANAGEMENT NOTE 04/20/2012  Patient:  [name], [name]   Account Number:  [id number]  Date Initiated:  04/20/2012  Documentation initiated by:  Anette Guarneri  Subjective/Objective Assessment:   POD#1 s/p right THA  lives at home with spouse, independent PTA  needs DME     Action/Plan:   Home with self care   Anticipated DC Date:  04/21/2012   Anticipated DC Plan:  HOME/SELF CARE      DC Planning Services  CM consult      PAC Choice  DURABLE MEDICAL EQUIPMENT   Choice offered to / List presented to:  C-1 Patient   DME arranged  3-N-1  Levan Hurst      DME agency  [facility]        Status of service:  Completed, signed off Medicare Important Message given?  NO (If response is "NO", the following Medicare IM given date fields will be blank) Date Medicare IM given:   Date Additional Medicare IM given:    Discharge Disposition:  HOME/SELF CARE  Per UR Regulation:  Reviewed for med. necessity/level of care/duration of stay  If discussed at Long Length of Stay Meetings, dates discussed:    Comments:  04/20/12  12:13  Anette Guarneri RN/CM Paitent plans to d/c home, needs 3n1 and RW, paitent choice for DME is [facility], contaceted [facility], aranged for DME to be delivered to room prior to d/c .

## 2012-04-21 ENCOUNTER — Encounter (HOSPITAL_COMMUNITY): Payer: Self-pay

## 2012-04-21 NOTE — Op Note (Signed)
[name], [name] NO.:  [id number]  MEDICAL RECORD NO.:  [id number]  LOCATION:  5N22C                        FACILITY:  [facility]  PHYSICIAN:  Mark C. Ophelia Charter, M.D.    DATE OF BIRTH:  04/28/1966  DATE OF PROCEDURE:  04/18/2012 DATE OF DISCHARGE:  04/20/2012                              OPERATIVE REPORT   ADDENDUM  PROCEDURE:  Right total hip arthroplasty.  [name], [facility] listed in the operative note was medically necessary and was present for the entire procedure.     Mark C. Ophelia Charter, M.D.     MCY/MEDQ  D:  04/20/2012  T:  04/21/2012  Job:  [id number]

## 2012-06-09 ENCOUNTER — Encounter (INDEPENDENT_AMBULATORY_CARE_PROVIDER_SITE_OTHER): Payer: Self-pay

## 2012-10-24 ENCOUNTER — Encounter (INDEPENDENT_AMBULATORY_CARE_PROVIDER_SITE_OTHER): Payer: Self-pay | Admitting: *Deleted

## 2012-11-07 ENCOUNTER — Ambulatory Visit (INDEPENDENT_AMBULATORY_CARE_PROVIDER_SITE_OTHER): Payer: BC Managed Care – PPO | Admitting: Internal Medicine

## 2012-11-09 ENCOUNTER — Ambulatory Visit (INDEPENDENT_AMBULATORY_CARE_PROVIDER_SITE_OTHER): Payer: BC Managed Care – PPO | Admitting: Internal Medicine

## 2012-11-09 ENCOUNTER — Encounter (INDEPENDENT_AMBULATORY_CARE_PROVIDER_SITE_OTHER): Payer: Self-pay | Admitting: Internal Medicine

## 2012-11-09 VITALS — BP 124/50 | HR 64 | Ht 63.0 in | Wt 294.2 lb

## 2012-11-09 DIAGNOSIS — K5090 Crohn's disease, unspecified, without complications: Secondary | ICD-10-CM

## 2012-11-09 NOTE — Progress Notes (Signed)
Subjective:     Patient ID: [name], female   DOB: 05/26/1966, 47 y.o.   MRN: [id number]  HPI Here today for f/u of her Crohns. She tells me she is doing good. She is having 2 BMs a day. Formed. No mucous or blood. No weight loss. No abdominal pain. She is exercising with the eliptical machine x 1 a day x 20 minutes a day.  She is a Runner, broadcasting/film/video and works full time.      Her last colonoscopy was in 2008 revealing active disease in the distal colon and she had a few polyps at transverse colon and all of these non adenomatous. She usually has 1-2 stools a day. No diarrhea.     Review of Systems see hpi Current Outpatient Prescriptions  Medication Sig Dispense Refill  . albuterol (PROVENTIL,VENTOLIN) 90 MCG/ACT inhaler Inhale 2 puffs into the lungs every 6 (six) hours as needed. Shortness of breath      . ALPRAZolam (XANAX) 0.5 MG tablet Take 0.5 mg by mouth at bedtime as needed. For sleep      . cetirizine (ZYRTEC) 10 MG tablet Take 10 mg by mouth daily.      . citalopram (CELEXA) 20 MG tablet Take 20 mg by mouth daily.      Marland Kitchen diltiazem (CARDIZEM CD) 240 MG 24 hr capsule Take 240 mg by mouth daily.      . fish oil-omega-3 fatty acids 1000 MG capsule Take 2 g by mouth daily.      . Fluticasone-Salmeterol (ADVAIR) 250-50 MCG/DOSE AEPB Inhale 1 puff into the lungs daily.      Marland Kitchen lisinopril-hydrochlorothiazide (PRINZIDE,ZESTORETIC) 20-12.5 MG per tablet Take 2 tablets by mouth daily.      . medroxyPROGESTERone (DEPO-PROVERA) 150 MG/ML injection Inject 150 mg into the muscle every 3 (three) months.      . mercaptopurine (PURINETHOL) 50 MG tablet Take 50 mg by mouth daily. Give on an empty stomach 1 hour before or 2 hours after meals. Caution: Chemotherapy.      . mercaptopurine (PURINETHOL) 50 MG tablet       . Multiple Vitamin (MULITIVITAMIN WITH MINERALS) TABS Take 1 tablet by mouth daily.      Marland Kitchen PENTASA 500 MG CR capsule TAKE 2 CAPSULES BY MOUTH FOUR TIMES DAILY  240 capsule  11  .  rosuvastatin (CRESTOR) 5 MG tablet Take 5 mg by mouth daily.       No current facility-administered medications for this visit.   Past Medical History  Diagnosis Date  . Crohn disease   . 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 High cholesterol   . Diabetes mellitus     diet controlled  . Hypertension     pcp  Dr Aurther Loft Reuel Boom    [place]  . Asthma   . Anxiety   . Sleep apnea 03-2009 study    pt forgot to mention at pre admit visit.  uses c-pap brought her own  machine from home.    Past Surgical History  Procedure Laterality Date  . Tonsillectomy    . Cyst removed from left hand    . Total hip arthroplasty  04/18/2012    Procedure: TOTAL HIP ARTHROPLASTY;  Surgeon: Eldred Manges, MD;  Location: [facility] OR;  Service: Orthopedics;  Laterality: Right;  Right Total Hip Arthroplasty   Allergies  Allergen Reactions  . Remicade (Infliximab) Swelling    Joints locked up  . Cefprozil Rash  . Penicillins Rash  Objective:   Physical Exam  Filed Vitals:   11/09/12 1607  BP: 124/50  Pulse: 64  Height: 5\' 3"  (1.6 m)  Weight: 294 lb 3.2 oz (133.448 kg)  Alert and oriented. Skin warm and dry. Oral mucosa is moist.   . Sclera anicteric, conjunctivae is pink. Thyroid not enlarged. No cervical lymphadenopathy. Lungs clear. Heart regular rate and rhythm.  Abdomen is soft. Bowel sounds are positive. No hepatomegaly. No abdominal masses felt. No tenderness.  No edema to lower extremities.        Assessment:    Crohn's disease . She is doing well. No recent flare.    Plan:    OV in 1 yr. CBC and CRP today. Will discuss with Dr. Karilyn Cota once labs are back about her 

## 2012-11-09 NOTE — Patient Instructions (Addendum)
CBC and CRP today. OV in 1 yr.

## 2012-11-10 LAB — CBC WITH DIFFERENTIAL/PLATELET
Basophils Absolute: 0 10*3/uL (ref 0.0–0.1)
Basophils Relative: 0 % (ref 0–1)
Eosinophils Absolute: 0.1 10*3/uL (ref 0.0–0.7)
Eosinophils Relative: 1 % (ref 0–5)
HCT: 41.3 % (ref 36.0–46.0)
Hemoglobin: 14.6 g/dL (ref 12.0–15.0)
Lymphocytes Relative: 23 % (ref 12–46)
Lymphs Abs: 2 10*3/uL (ref 0.7–4.0)
MCH: 33.6 pg (ref 26.0–34.0)
MCHC: 35.4 g/dL (ref 30.0–36.0)
MCV: 94.9 fL (ref 78.0–100.0)
Monocytes Absolute: 0.7 10*3/uL (ref 0.1–1.0)
Monocytes Relative: 9 % (ref 3–12)
Neutro Abs: 5.7 10*3/uL (ref 1.7–7.7)
Neutrophils Relative %: 67 % (ref 43–77)
Platelets: 274 10*3/uL (ref 150–400)
RBC: 4.35 MIL/uL (ref 3.87–5.11)
RDW: 13.1 % (ref 11.5–15.5)
WBC: 8.5 10*3/uL (ref 4.0–10.5)

## 2012-11-10 LAB — C-REACTIVE PROTEIN: CRP: 0.5 mg/dL (ref <0.60)

## 2012-11-15 ENCOUNTER — Telehealth (INDEPENDENT_AMBULATORY_CARE_PROVIDER_SITE_OTHER): Payer: Self-pay | Admitting: Internal Medicine

## 2012-11-15 ENCOUNTER — Other Ambulatory Visit (INDEPENDENT_AMBULATORY_CARE_PROVIDER_SITE_OTHER): Payer: Self-pay | Admitting: Internal Medicine

## 2012-11-15 NOTE — Telephone Encounter (Signed)
Colonoscopy will be scheduled

## 2012-11-15 NOTE — Telephone Encounter (Signed)
Needs a surveillance colonoscopy for Crohn's disease. I have discussed this with her. By the way Dewayne Hatch. Please tell her I did not hang up on her. The phones are messed up and she was disconnected.     Note to myself. If colonoscopy is clear: will stop the . I discussed this with Dr. Karilyn Cota.

## 2012-11-16 ENCOUNTER — Other Ambulatory Visit (INDEPENDENT_AMBULATORY_CARE_PROVIDER_SITE_OTHER): Payer: Self-pay | Admitting: *Deleted

## 2012-11-16 ENCOUNTER — Telehealth (INDEPENDENT_AMBULATORY_CARE_PROVIDER_SITE_OTHER): Payer: Self-pay | Admitting: *Deleted

## 2012-11-16 DIAGNOSIS — Z1211 Encounter for screening for malignant neoplasm of colon: Secondary | ICD-10-CM

## 2012-11-16 DIAGNOSIS — K5090 Crohn's disease, unspecified, without complications: Secondary | ICD-10-CM

## 2012-11-16 NOTE — Telephone Encounter (Signed)
Patient needs trilyte 

## 2012-11-16 NOTE — Telephone Encounter (Signed)
TCS sch'd 12/15/12 @ 200, patient aware

## 2012-11-17 MED ORDER — PEG 3350-KCL-NA BICARB-NACL 420 G PO SOLR: 4000.0000 mL | Freq: Once | ORAL | Status: DC

## 2012-11-28 ENCOUNTER — Encounter (HOSPITAL_COMMUNITY): Payer: Self-pay | Admitting: Pharmacy Technician

## 2012-12-06 ENCOUNTER — Other Ambulatory Visit (INDEPENDENT_AMBULATORY_CARE_PROVIDER_SITE_OTHER): Payer: Self-pay | Admitting: Internal Medicine

## 2012-12-06 NOTE — Telephone Encounter (Signed)
This prescription was sent per Dr.[name]'s approval.

## 2012-12-15 ENCOUNTER — Ambulatory Visit (HOSPITAL_COMMUNITY)
Admission: RE | Admit: 2012-12-15 | Payer: BC Managed Care – PPO | Source: Ambulatory Visit | Admitting: Internal Medicine

## 2012-12-15 ENCOUNTER — Encounter (HOSPITAL_COMMUNITY): Admission: RE | Payer: Self-pay | Source: Ambulatory Visit

## 2012-12-15 SURGERY — COLONOSCOPY
Anesthesia: Moderate Sedation

## 2013-02-20 ENCOUNTER — Encounter (INDEPENDENT_AMBULATORY_CARE_PROVIDER_SITE_OTHER): Payer: Self-pay | Admitting: *Deleted

## 2013-02-20 ENCOUNTER — Other Ambulatory Visit (INDEPENDENT_AMBULATORY_CARE_PROVIDER_SITE_OTHER): Payer: Self-pay | Admitting: *Deleted

## 2013-02-20 DIAGNOSIS — K5090 Crohn's disease, unspecified, without complications: Secondary | ICD-10-CM

## 2013-02-24 ENCOUNTER — Encounter (HOSPITAL_COMMUNITY): Payer: Self-pay | Admitting: Pharmacy Technician

## 2013-03-16 ENCOUNTER — Encounter (HOSPITAL_COMMUNITY)
Admission: RE | Disposition: A | Discharge status: Home / Self Care | Payer: Self-pay | Source: Ambulatory Visit | Attending: Internal Medicine

## 2013-03-16 ENCOUNTER — Ambulatory Visit (HOSPITAL_COMMUNITY)
Admission: RE | Admit: 2013-03-16 | Discharge: 2013-03-16 | Disposition: A | Discharge status: Home / Self Care | Payer: BC Managed Care – PPO | Source: Ambulatory Visit | Attending: Internal Medicine | Admitting: Internal Medicine

## 2013-03-16 ENCOUNTER — Encounter (HOSPITAL_COMMUNITY): Payer: Self-pay | Admitting: *Deleted

## 2013-03-16 DIAGNOSIS — K644 Residual hemorrhoidal skin tags: Secondary | ICD-10-CM

## 2013-03-16 DIAGNOSIS — Z79899 Other long term (current) drug therapy: Secondary | ICD-10-CM | POA: Insufficient documentation

## 2013-03-16 DIAGNOSIS — I10 Essential (primary) hypertension: Secondary | ICD-10-CM | POA: Insufficient documentation

## 2013-03-16 DIAGNOSIS — K5090 Crohn's disease, unspecified, without complications: Secondary | ICD-10-CM

## 2013-03-16 DIAGNOSIS — Z880 Allergy status to penicillin: Secondary | ICD-10-CM | POA: Insufficient documentation

## 2013-03-16 DIAGNOSIS — G4730 Sleep apnea, unspecified: Secondary | ICD-10-CM | POA: Insufficient documentation

## 2013-03-16 DIAGNOSIS — E119 Type 2 diabetes mellitus without complications: Secondary | ICD-10-CM | POA: Insufficient documentation

## 2013-03-16 DIAGNOSIS — J45909 Unspecified asthma, uncomplicated: Secondary | ICD-10-CM | POA: Insufficient documentation

## 2013-03-16 DIAGNOSIS — E7800 Pure hypercholesterolemia, unspecified: Secondary | ICD-10-CM | POA: Insufficient documentation

## 2013-03-16 DIAGNOSIS — D126 Benign neoplasm of colon, unspecified: Secondary | ICD-10-CM | POA: Insufficient documentation

## 2013-03-16 DIAGNOSIS — Z1211 Encounter for screening for malignant neoplasm of colon: Secondary | ICD-10-CM

## 2013-03-16 DIAGNOSIS — K5010 Crohn's disease of large intestine without complications: Secondary | ICD-10-CM | POA: Insufficient documentation

## 2013-03-16 DIAGNOSIS — E669 Obesity, unspecified: Secondary | ICD-10-CM | POA: Insufficient documentation

## 2013-03-16 DIAGNOSIS — Z888 Allergy status to other drugs, medicaments and biological substances status: Secondary | ICD-10-CM | POA: Insufficient documentation

## 2013-03-16 DIAGNOSIS — F411 Generalized anxiety disorder: Secondary | ICD-10-CM | POA: Insufficient documentation

## 2013-03-16 HISTORY — PX: COLONOSCOPY: SHX5424

## 2013-03-16 SURGERY — COLONOSCOPY
Anesthesia: Moderate Sedation

## 2013-03-16 MED ORDER — LEVOFLOXACIN IN D5W 500 MG/100ML IV SOLN
500.0000 mg | Freq: Once | INTRAVENOUS | Status: AC
  Administered 2013-03-16: 500 mg via INTRAVENOUS

## 2013-03-16 MED ORDER — MIDAZOLAM HCL 5 MG/5ML IJ SOLN
INTRAMUSCULAR | Status: AC
  Filled 2013-03-16: qty 5

## 2013-03-16 MED ORDER — STERILE WATER FOR IRRIGATION IR SOLN
Status: DC | PRN
  Administered 2013-03-16: 15:00:00

## 2013-03-16 MED ORDER — SODIUM CHLORIDE 0.9 % IV SOLN
INTRAVENOUS | Status: DC
  Administered 2013-03-16: 1000 mL via INTRAVENOUS

## 2013-03-16 MED ORDER — MIDAZOLAM HCL 5 MG/5ML IJ SOLN
INTRAMUSCULAR | Status: AC
  Filled 2013-03-16: qty 10

## 2013-03-16 MED ORDER — MEPERIDINE HCL 50 MG/ML IJ SOLN
INTRAMUSCULAR | Status: DC | PRN
  Administered 2013-03-16 (×2): 25 mg via INTRAVENOUS

## 2013-03-16 MED ORDER — MIDAZOLAM HCL 5 MG/5ML IJ SOLN
INTRAMUSCULAR | Status: DC | PRN
  Administered 2013-03-16 (×2): 3 mg via INTRAVENOUS
  Administered 2013-03-16: 2 mg via INTRAVENOUS
  Administered 2013-03-16: 3 mg via INTRAVENOUS
  Administered 2013-03-16: 2 mg via INTRAVENOUS

## 2013-03-16 MED ORDER — LEVOFLOXACIN IN D5W 500 MG/100ML IV SOLN
INTRAVENOUS | Status: AC
  Filled 2013-03-16: qty 100

## 2013-03-16 MED ORDER — MEPERIDINE HCL 50 MG/ML IJ SOLN
INTRAMUSCULAR | Status: AC
  Filled 2013-03-16: qty 1

## 2013-03-16 NOTE — H&P (Signed)
[name] is an 47 y.o. female.   Chief Complaint: Patient is here for colonoscopy. HPI: Patient is 66 old Caucasian female who is 16 history of Crohn's colitis and was therefore surveillance colonoscopy. She is presently on Pentasa and 6-MP. She denies diarrhea or rectal bleeding. She is having normal stools daily. She complains of right upper quadrant pain located laterally and posterior made worse with meals. She had an ultrasound earlier this month which was negative for cholelithiasis. Showed fatty liver. She also had HIDA scan 6 days ago revealing EF of 96%. Her pain was reproduced with HIDA scan.  Past Medical History  Diagnosis Date  . Crohn disease   . High cholesterol   . Diabetes mellitus     diet controlled  . Hypertension     pcp  Dr Aurther Loft Reuel Boom    [place]  . Asthma   . Anxiety   . Sleep apnea 03-2009 study    pt forgot to mention at pre admit visit.  uses c-pap brought her own  machine from home.     Past Surgical History  Procedure Laterality Date  . Tonsillectomy    . Cyst removed from left hand    . Total hip arthroplasty  04/18/2012    Procedure: TOTAL HIP ARTHROPLASTY;  Surgeon: Eldred Manges, MD;  Location: [facility] OR;  Service: Orthopedics;  Laterality: Right;  Right Total Hip Arthroplasty    History reviewed. No pertinent family history. Social History:  reports that she has never smoked. She does not have any smokeless tobacco history on file. She reports that she does not drink alcohol or use illicit drugs.  Allergies:  Allergies  Allergen Reactions  . Remicade (Infliximab) Swelling    Joints locked up  . Cefprozil Rash  . Penicillins Rash    Medications Prior to Admission  Medication Sig Dispense Refill  . albuterol (PROVENTIL,VENTOLIN) 90 MCG/ACT inhaler Inhale 2 puffs into the lungs every 6 (six) hours as needed. Shortness of breath      . ALPRAZolam (XANAX) 0.5 MG tablet Take 0.5 mg by mouth at bedtime as needed. For sleep      . atorvastatin  (LIPITOR) 10 MG tablet Take 10 mg by mouth daily.      . cetirizine (ZYRTEC) 10 MG tablet Take 10 mg by mouth daily.      . citalopram (CELEXA) 20 MG tablet Take 20 mg by mouth daily.      Marland Kitchen diltiazem (CARDIZEM CD) 240 MG 24 hr capsule Take 240 mg by mouth daily.      . Fluticasone-Salmeterol (ADVAIR) 250-50 MCG/DOSE AEPB Inhale 1 puff into the lungs daily.      Marland Kitchen lisinopril-hydrochlorothiazide (PRINZIDE,ZESTORETIC) 20-12.5 MG per tablet Take 2 tablets by mouth daily.      . mercaptopurine (PURINETHOL) 50 MG tablet Take 50 mg by mouth daily.       . Multiple Vitamin (MULITIVITAMIN WITH MINERALS) TABS Take 1 tablet by mouth daily.      Marland Kitchen PENTASA 500 MG CR capsule TAKE 2 CAPSULES BY MOUTH FOUR TIMES DAILY  240 capsule  11  . medroxyPROGESTERone (DEPO-PROVERA) 150 MG/ML injection Inject 150 mg into the muscle every 3 (three) months.        No results found for this or any previous visit (from the past 48 hour(s)). No results found.  ROS  Blood pressure 151/74, pulse 84, temperature 98.6 F (37 C), temperature source Oral, resp. rate 24, height 5\' 3"  (1.6 m), weight 295 lb (  133.811 kg), SpO2 94.00%. Physical Exam  Constitutional:  Pleasant well-developed obese female in NAD  HENT:  Mouth/Throat: Oropharynx is clear and moist.  Eyes: Conjunctivae are normal. No scleral icterus.  Neck: No thyromegaly present.  Cardiovascular: Normal rate, regular rhythm and normal heart sounds.   No murmur heard. Respiratory: Effort normal and breath sounds normal.  GI:  Abdomen is obese but soft and nontender without organomegaly or masses  Lymphadenopathy:    She has no cervical adenopathy.     Assessment/Plan Crohn's colitis 16 years duration. Surveillance colonoscopy.  [name],[name] 03/16/2013, 3:09 PM

## 2013-03-16 NOTE — Op Note (Signed)
COLONOSCOPY PROCEDURE REPORT  PATIENT:  MONNA CREAN  MR#:  [id number] Birthdate:  10/26/1965, 47 y.o., female Endoscopist:  Dr. Malissa Hippo, MD Referred By:  Dr. Donzetta Sprung, MD Procedure Date: 03/16/2013  Procedure:   Colonoscopy.  Indications:  Patient is 47 year old Caucasian female with history of Crohn's colitis was undergoing surveillance colonoscopy. She has had this condition for 16 years.  Informed Consent:  The procedure and risks were reviewed with the patient and informed consent was obtained.  Medications:  Demerol 50 mg IV Versed 13 mg IV  Description of procedure:  After a digital rectal exam was performed, that colonoscope was advanced from the anus through the rectum and colon to the area of the cecum, ileocecal valve and appendiceal orifice. The cecum was deeply intubated. These structures were well-seen and photographed for the record. From the level of the cecum and ileocecal valve, the scope was slowly and cautiously withdrawn. The mucosal surfaces were carefully surveyed utilizing scope tip to flexion to facilitate fold flattening as needed. The scope was pulled down into the rectum where a thorough exam including retroflexion was performed. Terminal ileum was also examined.  Findings:   Prep excellent. Normal mucosa of terminal ileum. Patchy scarring noted in the region of hepatic flexure with cluster of 3-4 mm polyps felt to be pseudopolyps. Few more located distally. Four of these were biopsied for routine histology. Normal rectal mucosa. Small hemorrhoids below the dentate line.   Therapeutic/Diagnostic Maneuvers Performed:  See above  Complications:  None  Cecal Withdrawal Time:  12 minutes  Impression:  Normal mucosa of terminal ileum. Focal scarring in the region of hepatic flexure and cluster of small polyps felt to be pseudopolyps. Biopsy taken. Small external hemorrhoids. Crohn's colitis is in remission.  Recommendations:  Standard  instructions given. I will contact patient with biopsy results and further recommendations.  [name],[name]  03/16/2013 3:55 PM  CC: Dr. Donzetta Sprung, MD & Dr. Bonnetta Barry ref. provider found

## 2013-03-17 ENCOUNTER — Encounter (HOSPITAL_COMMUNITY): Payer: Self-pay | Admitting: Internal Medicine

## 2013-03-21 ENCOUNTER — Telehealth (INDEPENDENT_AMBULATORY_CARE_PROVIDER_SITE_OTHER): Payer: Self-pay | Admitting: Internal Medicine

## 2013-03-21 NOTE — Telephone Encounter (Signed)
Talked with Dr. Reuel Boom about patient,s symptoms. He agrees with surgical consultation since symptoms typical for GB disease. Patient would like to see Dr. Lovell Sheehan and will call their office to make an appointment. I will go over patient's history to Dr. Lovell Sheehan was seen in the hospital tomorrow.

## 2013-03-22 ENCOUNTER — Encounter (INDEPENDENT_AMBULATORY_CARE_PROVIDER_SITE_OTHER): Payer: Self-pay | Admitting: *Deleted

## 2013-03-22 ENCOUNTER — Telehealth (INDEPENDENT_AMBULATORY_CARE_PROVIDER_SITE_OTHER): Payer: Self-pay | Admitting: *Deleted

## 2013-03-22 NOTE — Telephone Encounter (Signed)
[name] gave Dr. Karilyn Cota a copy of her [place] & H[place] scan. She was told by Dr. Karilyn Cota, he was speak with Dr. Lovell Sheehan and send a referral over to him. [name] call Dr. Lovell Sheehan and was not able to schedule an apt. Her return phone number is [phone].

## 2013-03-23 ENCOUNTER — Other Ambulatory Visit (INDEPENDENT_AMBULATORY_CARE_PROVIDER_SITE_OTHER): Payer: Self-pay | Admitting: Internal Medicine

## 2013-03-23 DIAGNOSIS — R109 Unspecified abdominal pain: Secondary | ICD-10-CM

## 2013-03-23 NOTE — Progress Notes (Signed)
LM on home and cell number to return the to schedule a 4 week f/u apt. 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

## 2013-03-23 NOTE — Progress Notes (Signed)
Apt has been scheduled for 07/18/13 with Dr. [name]. 

## 2013-03-23 NOTE — Telephone Encounter (Signed)
appt sch'd 03/28/13 @ 200, patient aware

## 2013-03-28 NOTE — H&P (Signed)
  NTS SOAP Note  Vital Signs:  Vitals as of: 03/28/2013: Systolic 166: Diastolic 75: Heart Rate 89: Temp 98.5F: Height 75ft 3in: Weight 306Lbs 0 Ounces: Pain Level 6: BMI 54.2  BMI : 54.2 kg/m2  Subjective: This 47 Years 36 Months old Female presents for of    ABDOMINAL PAIN : ,Has been having right upper quadrant abdominal pain, nausea, and vomiting over the past few years.  Seems to be getting worse.  No fever, chills, jaundice.  U/S of gallbladder negative.  HIDA reveals normal ejection fraction with reproducible symptoms with CCK injection.  Review of Symptoms:  Constitutional:unremarkable   Head:unremarkable    Eyes:unremarkable   Nose/Mouth/Throat:unremarkable Cardiovascular:  unremarkable   Respiratory:unremarkable   Gastrointestin    abdominal pain,nausea,vomiting,heartburn Genitourinary:unremarkable       back pain Skin:unremarkable Hematolgic/Lymphatic:unremarkable       seasonal allergies   Past Medical History:    Reviewed   Past Medical History      Not Available   Social History:Reviewed  Social History  Preferred Language: English Race:  White Ethnicity: Not Hispanic / Latino Age: 47 Years 8 Months Marital Status:  M Alcohol:  No Recreational drug(s):  No   Smoking Status: Never smoker reviewed on 03/28/2013 Functional Status reviewed on mm/dd/yyyy ------------------------------------------------ Bathing: Normal Cooking: Normal Dressing: Normal Driving: Normal Eating: Normal Managing Meds: Normal Oral Care: Normal Shopping: Normal Toileting: Normal Transferring: Normal Walking: Normal Cognitive Status reviewed on mm/dd/yyyy ------------------------------------------------ Attention: Normal Decision Making: Normal Language: Normal Memory: Normal Motor: Normal Perception: Normal Problem Solving: Normal Visual and Spatial: Normal   Family History:  Reviewed  Family Health History Mother,  Living; Healthy; healthy Father, Living; Parkinson's disease;     Objective Information: General:  Well appearing, well nourished in no distress.   no scleral icterus Heart:  RRR, no murmur Lungs:    CTA bilaterally, no wheezes, rhonchi, rales.  Breathing unlabored. Abdomen:Soft, tender in right upper quadrant to deep palpation, ND, no HSM, no masses.  Assessment:Chronic cholecystitis  Diagnosis &amp; Procedure Smart Code   Plan:Scheduled for laparoscopic cholecystectomy on 03/31/13.   Patient Education:Alternative treatments to surgery were discussed with patient (and family).  Risks and benefits  of procedure bleeding, infection, hepatobiliary injury, and the possibility of an open procedure were fully explained to the patient (and family) who gave informed consent. Patient/family questions were addressed.  Follow-up:Pending Surgery

## 2013-03-29 ENCOUNTER — Encounter (HOSPITAL_COMMUNITY): Payer: Self-pay | Admitting: Pharmacy Technician

## 2013-03-29 ENCOUNTER — Encounter (HOSPITAL_COMMUNITY): Payer: Self-pay

## 2013-03-29 ENCOUNTER — Other Ambulatory Visit: Payer: Self-pay

## 2013-03-29 ENCOUNTER — Encounter (HOSPITAL_COMMUNITY)
Admission: RE | Admit: 2013-03-29 | Discharge: 2013-03-29 | Disposition: A | Discharge status: Home / Self Care | Payer: BC Managed Care – PPO | Source: Ambulatory Visit | Attending: General Surgery | Admitting: General Surgery

## 2013-03-29 HISTORY — DX: Unspecified osteoarthritis, unspecified site: M19.90

## 2013-03-29 LAB — HEPATIC FUNCTION PANEL
ALT: 14 U/L (ref 0–35)
AST: 12 U/L (ref 0–37)
Albumin: 3.4 g/dL — ABNORMAL LOW (ref 3.5–5.2)
Alkaline Phosphatase: 78 U/L (ref 39–117)
Bilirubin, Direct: 0.1 mg/dL (ref 0.0–0.3)
Indirect Bilirubin: 0.5 mg/dL (ref 0.3–0.9)
Total Bilirubin: 0.6 mg/dL (ref 0.3–1.2)
Total Protein: 6.8 g/dL (ref 6.0–8.3)

## 2013-03-29 LAB — CBC WITH DIFFERENTIAL/PLATELET
Basophils Absolute: 0 10*3/uL (ref 0.0–0.1)
Basophils Relative: 0 % (ref 0–1)
Eosinophils Absolute: 0.1 10*3/uL (ref 0.0–0.7)
Eosinophils Relative: 2 % (ref 0–5)
HCT: 40.7 % (ref 36.0–46.0)
Hemoglobin: 13.7 g/dL (ref 12.0–15.0)
Lymphocytes Relative: 24 % (ref 12–46)
Lymphs Abs: 1.6 10*3/uL (ref 0.7–4.0)
MCH: 33.5 pg (ref 26.0–34.0)
MCHC: 33.7 g/dL (ref 30.0–36.0)
MCV: 99.5 fL (ref 78.0–100.0)
Monocytes Absolute: 0.3 10*3/uL (ref 0.1–1.0)
Monocytes Relative: 5 % (ref 3–12)
Neutro Abs: 4.6 10*3/uL (ref 1.7–7.7)
Neutrophils Relative %: 69 % (ref 43–77)
Platelets: 248 10*3/uL (ref 150–400)
RBC: 4.09 MIL/uL (ref 3.87–5.11)
RDW: 12.4 % (ref 11.5–15.5)
WBC: 6.6 10*3/uL (ref 4.0–10.5)

## 2013-03-29 LAB — BASIC METABOLIC PANEL
BUN: 11 mg/dL (ref 6–23)
CO2: 28 mEq/L (ref 19–32)
Calcium: 9.2 mg/dL (ref 8.4–10.5)
Chloride: 101 mEq/L (ref 96–112)
Creatinine, Ser: 0.76 mg/dL (ref 0.50–1.10)
GFR calc Af Amer: >90 mL/min (ref >90)
GFR calc non Af Amer: >90 mL/min (ref >90)
Glucose, Bld: 232 mg/dL — ABNORMAL HIGH (ref 70–99)
Potassium: 3.8 mEq/L (ref 3.5–5.1)
Sodium: 138 mEq/L (ref 135–145)

## 2013-03-29 NOTE — Patient Instructions (Addendum)
[name]  03/29/2013   Your procedure is scheduled on:  03/31/2013  Report to [facility] at  615  AM.  Call this number if you have problems the morning of surgery: [phone]   Remember:   Do not eat food or drink liquids after midnight.   Take these medicines the morning of surgery with A SIP OF WATER: xanax, zyrtec, celexa, cardiazem, prinizide. Take your albuterol and your advair before you come.   Do not wear jewelry, make-up or nail polish.  Do not wear lotions, powders, or perfumes.   Do not shave 48 hours prior to surgery. Men may shave face and neck.  Do not bring valuables to the hospital.  [facility] is not responsible  for any belongings or valuables.  Contacts, dentures or bridgework may not be worn into surgery.  Leave suitcase in the car. After surgery it may be brought to your room.  For patients admitted to the hospital, checkout time is 11:00 AM the day of discharge.   Patients discharged the day of surgery will not be allowed to drive home.  Name and phone number of your driver: family  Special Instructions: Shower using CHG 2 nights before surgery and the night before surgery.  If you shower the day of surgery use CHG.  Use special wash - you have one bottle of CHG for all showers.  You should use approximately 1/3 of the bottle for each shower.   Please read over the following fact sheets that you were given: Pain Booklet, Coughing and Deep Breathing, Surgical Site Infection Prevention, Anesthesia Post-op Instructions and Care and Recovery After Surgery Laparoscopic Cholecystectomy Laparoscopic cholecystectomy is surgery to remove the gallbladder. The gallbladder is located slightly to the right of center in the abdomen, behind the liver. It is a concentrating and storage sac for the bile produced in the liver. Bile aids in the digestion and absorption of fats. Gallbladder disease (cholecystitis) is an inflammation of your gallbladder. This condition is  usually caused by a buildup of gallstones (cholelithiasis) in your gallbladder. Gallstones can block the flow of bile, resulting in inflammation and pain. In severe cases, emergency surgery may be required. When emergency surgery is not required, you will have time to prepare for the procedure. Laparoscopic surgery is an alternative to open surgery. Laparoscopic surgery usually has a shorter recovery time. Your common bile duct may also need to be examined and explored. Your caregiver will discuss this with you if he or she feels this should be done. If stones are found in the common bile duct, they may be removed. LET YOUR CAREGIVER KNOW ABOUT:  Allergies to food or medicine.  Medicines taken, including vitamins, herbs, eyedrops, over-the-counter medicines, and creams.  Use of steroids (by mouth or creams).  Previous problems with anesthetics or numbing medicines.  History of bleeding problems or blood clots.  Previous surgery.  Other health problems, including diabetes and kidney problems.  Possibility of pregnancy, if this applies. RISKS AND COMPLICATIONS All surgery is associated with risks. Some problems that may occur following this procedure include:  Infection.  Damage to the common bile duct, nerves, arteries, veins, or other internal organs such as the stomach or intestines.  Bleeding.  A stone may remain in the common bile duct. BEFORE THE PROCEDURE  Do not take aspirin for 3 days prior to surgery or blood thinners for 1 week prior to surgery.  Do not eat or drink anything after midnight  the night before surgery.  Let your caregiver know if you develop a cold or other infectious problem prior to surgery.  You should be present 60 minutes before the procedure or as directed. PROCEDURE  You will be given medicine that makes you sleep (general anesthetic). When you are asleep, your surgeon will make several small cuts (incisions) in your abdomen. One of these incisions  is used to insert a small, lighted scope (laparoscope) into the abdomen. The laparoscope helps the surgeon see into your abdomen. Carbon dioxide gas will be pumped into your abdomen. The gas allows more room for the surgeon to perform your surgery. Other operating instruments are inserted through the other incisions. Laparoscopic procedures may not be appropriate when:  There is major scarring from previous surgery.  The gallbladder is extremely inflamed.  There are bleeding disorders or unexpected cirrhosis of the liver.  A pregnancy is near term.  Other conditions make the laparoscopic procedure impossible. If your surgeon feels it is not safe to continue with a laparoscopic procedure, he or she will perform an open abdominal procedure. In this case, the surgeon will make an incision to open the abdomen. This gives the surgeon a larger view and field to work within. This may allow the surgeon to perform procedures that sometimes cannot be performed with a laparoscope alone. Open surgery has a longer recovery time. AFTER THE PROCEDURE  You will be taken to the recovery area where a nurse will watch and check your progress.  You may be allowed to go home the same day.  Do not resume physical activities until directed by your caregiver.  You may resume a normal diet and activities as directed. Document Released: 08/17/2005 Document Revised: 11/09/2011 Document Reviewed: 01/30/2011 [facility] Patient Information 2014 [place], [place]. PATIENT INSTRUCTIONS POST-ANESTHESIA  IMMEDIATELY FOLLOWING SURGERY:  Do not drive or operate machinery for the first twenty four hours after surgery.  Do not make any important decisions for twenty four hours after surgery or while taking narcotic pain medications or sedatives.  If you develop intractable nausea and vomiting or a severe headache please notify your doctor immediately.  FOLLOW-UP:  Please make an appointment with your surgeon as instructed. You  do not need to follow up with anesthesia unless specifically instructed to do so.  WOUND CARE INSTRUCTIONS (if applicable):  Keep a dry clean dressing on the anesthesia/puncture wound site if there is drainage.  Once the wound has quit draining you may leave it open to air.  Generally you should leave the bandage intact for twenty four hours unless there is drainage.  If the epidural site drains for more than 36-48 hours please call the anesthesia department.  QUESTIONS?:  Please feel free to call your physician or the hospital operator if you have any questions, and they will be happy to assist you.

## 2013-03-31 ENCOUNTER — Encounter (HOSPITAL_COMMUNITY)
Admission: RE | Disposition: A | Discharge status: Home / Self Care | Payer: Self-pay | Source: Ambulatory Visit | Attending: General Surgery

## 2013-03-31 ENCOUNTER — Encounter (HOSPITAL_COMMUNITY): Payer: Self-pay | Admitting: Anesthesiology

## 2013-03-31 ENCOUNTER — Ambulatory Visit (HOSPITAL_COMMUNITY): Payer: BC Managed Care – PPO | Admitting: Anesthesiology

## 2013-03-31 ENCOUNTER — Encounter (HOSPITAL_COMMUNITY): Payer: Self-pay | Admitting: *Deleted

## 2013-03-31 ENCOUNTER — Ambulatory Visit (HOSPITAL_COMMUNITY)
Admission: RE | Admit: 2013-03-31 | Discharge: 2013-03-31 | Disposition: A | Discharge status: Home / Self Care | Payer: BC Managed Care – PPO | Source: Ambulatory Visit | Attending: General Surgery | Admitting: General Surgery

## 2013-03-31 DIAGNOSIS — I10 Essential (primary) hypertension: Secondary | ICD-10-CM | POA: Insufficient documentation

## 2013-03-31 DIAGNOSIS — Z79899 Other long term (current) drug therapy: Secondary | ICD-10-CM | POA: Insufficient documentation

## 2013-03-31 DIAGNOSIS — M129 Arthropathy, unspecified: Secondary | ICD-10-CM | POA: Insufficient documentation

## 2013-03-31 DIAGNOSIS — F411 Generalized anxiety disorder: Secondary | ICD-10-CM | POA: Insufficient documentation

## 2013-03-31 DIAGNOSIS — Z8711 Personal history of peptic ulcer disease: Secondary | ICD-10-CM | POA: Insufficient documentation

## 2013-03-31 DIAGNOSIS — G4730 Sleep apnea, unspecified: Secondary | ICD-10-CM | POA: Insufficient documentation

## 2013-03-31 DIAGNOSIS — J45909 Unspecified asthma, uncomplicated: Secondary | ICD-10-CM | POA: Insufficient documentation

## 2013-03-31 DIAGNOSIS — E119 Type 2 diabetes mellitus without complications: Secondary | ICD-10-CM | POA: Insufficient documentation

## 2013-03-31 DIAGNOSIS — K811 Chronic cholecystitis: Secondary | ICD-10-CM | POA: Insufficient documentation

## 2013-03-31 DIAGNOSIS — Z6841 Body Mass Index (BMI) 40.0 and over, adult: Secondary | ICD-10-CM | POA: Insufficient documentation

## 2013-03-31 HISTORY — PX: CHOLECYSTECTOMY: SHX55

## 2013-03-31 LAB — GLUCOSE, CAPILLARY
Glucose-Capillary: 142 mg/dL — ABNORMAL HIGH (ref 70–99)
Glucose-Capillary: 178 mg/dL — ABNORMAL HIGH (ref 70–99)

## 2013-03-31 SURGERY — LAPAROSCOPIC CHOLECYSTECTOMY
Anesthesia: General | Site: Abdomen | Wound class: Contaminated

## 2013-03-31 MED ORDER — ONDANSETRON HCL 4 MG/2ML IJ SOLN
4.0000 mg | Freq: Once | INTRAMUSCULAR | Status: AC
  Administered 2013-03-31: 4 mg via INTRAVENOUS

## 2013-03-31 MED ORDER — FENTANYL CITRATE 0.05 MG/ML IJ SOLN
25.0000 ug | INTRAMUSCULAR | Status: DC | PRN
  Administered 2013-03-31 (×4): 50 ug via INTRAVENOUS

## 2013-03-31 MED ORDER — GLYCOPYRROLATE 0.2 MG/ML IJ SOLN
INTRAMUSCULAR | Status: DC | PRN
  Administered 2013-03-31: 0.4 mg via INTRAVENOUS

## 2013-03-31 MED ORDER — BUPIVACAINE HCL (PF) 0.5 % IJ SOLN
INTRAMUSCULAR | Status: DC | PRN
  Administered 2013-03-31: 10 mL

## 2013-03-31 MED ORDER — KETOROLAC TROMETHAMINE 30 MG/ML IJ SOLN
30.0000 mg | Freq: Once | INTRAMUSCULAR | Status: AC
  Administered 2013-03-31: 30 mg via INTRAVENOUS

## 2013-03-31 MED ORDER — LACTATED RINGERS IV SOLN
INTRAVENOUS | Status: DC
  Administered 2013-03-31: 1000 mL via INTRAVENOUS
  Administered 2013-03-31: 09:00:00 via INTRAVENOUS

## 2013-03-31 MED ORDER — CLINDAMYCIN PHOSPHATE 900 MG/50ML IV SOLN
900.0000 mg | Freq: Once | INTRAVENOUS | Status: AC
  Administered 2013-03-31: 900 mg via INTRAVENOUS

## 2013-03-31 MED ORDER — FENTANYL CITRATE 0.05 MG/ML IJ SOLN
INTRAMUSCULAR | Status: AC
  Filled 2013-03-31: qty 2

## 2013-03-31 MED ORDER — NEOSTIGMINE METHYLSULFATE 1 MG/ML IJ SOLN
INTRAMUSCULAR | Status: DC | PRN
  Administered 2013-03-31: 3 mg via INTRAVENOUS

## 2013-03-31 MED ORDER — ONDANSETRON HCL 4 MG/2ML IJ SOLN
INTRAMUSCULAR | Status: AC
  Filled 2013-03-31: qty 2

## 2013-03-31 MED ORDER — KETOROLAC TROMETHAMINE 30 MG/ML IJ SOLN
INTRAMUSCULAR | Status: AC
  Filled 2013-03-31: qty 1

## 2013-03-31 MED ORDER — FENTANYL CITRATE 0.05 MG/ML IJ SOLN
INTRAMUSCULAR | Status: AC
  Filled 2013-03-31: qty 5

## 2013-03-31 MED ORDER — MIDAZOLAM HCL 2 MG/2ML IJ SOLN
INTRAMUSCULAR | Status: AC
  Filled 2013-03-31: qty 2

## 2013-03-31 MED ORDER — HEMOSTATIC AGENTS (NO CHARGE) OPTIME
TOPICAL | Status: DC | PRN
  Administered 2013-03-31: 1 via TOPICAL

## 2013-03-31 MED ORDER — PROPOFOL 10 MG/ML IV BOLUS
INTRAVENOUS | Status: DC | PRN
  Administered 2013-03-31: 170 mg via INTRAVENOUS

## 2013-03-31 MED ORDER — LIDOCAINE HCL 1 % IJ SOLN
INTRAMUSCULAR | Status: DC | PRN
  Administered 2013-03-31: 50 mg via INTRADERMAL

## 2013-03-31 MED ORDER — ROCURONIUM BROMIDE 100 MG/10ML IV SOLN
INTRAVENOUS | Status: DC | PRN
  Administered 2013-03-31: 40 mg via INTRAVENOUS

## 2013-03-31 MED ORDER — GLYCOPYRROLATE 0.2 MG/ML IJ SOLN
0.2000 mg | Freq: Once | INTRAMUSCULAR | Status: AC
  Administered 2013-03-31: 0.2 mg via INTRAVENOUS

## 2013-03-31 MED ORDER — DEXAMETHASONE SODIUM PHOSPHATE 4 MG/ML IJ SOLN
4.0000 mg | Freq: Once | INTRAMUSCULAR | Status: AC
  Administered 2013-03-31: 4 mg via INTRAVENOUS

## 2013-03-31 MED ORDER — LIDOCAINE HCL (PF) 1 % IJ SOLN
INTRAMUSCULAR | Status: AC
  Filled 2013-03-31: qty 5

## 2013-03-31 MED ORDER — GLYCOPYRROLATE 0.2 MG/ML IJ SOLN
INTRAMUSCULAR | Status: AC
  Filled 2013-03-31: qty 2

## 2013-03-31 MED ORDER — SODIUM CHLORIDE 0.9 % IR SOLN
Status: DC | PRN
  Administered 2013-03-31: 1000 mL

## 2013-03-31 MED ORDER — CLINDAMYCIN PHOSPHATE 900 MG/50ML IV SOLN
INTRAVENOUS | Status: AC
  Filled 2013-03-31: qty 50

## 2013-03-31 MED ORDER — CHLORHEXIDINE GLUCONATE 4 % EX LIQD: 1.0000 "application " | Freq: Once | CUTANEOUS | Status: DC

## 2013-03-31 MED ORDER — DEXAMETHASONE SODIUM PHOSPHATE 4 MG/ML IJ SOLN
INTRAMUSCULAR | Status: AC
  Filled 2013-03-31: qty 1

## 2013-03-31 MED ORDER — PROPOFOL 10 MG/ML IV EMUL
INTRAVENOUS | Status: AC
  Filled 2013-03-31: qty 20

## 2013-03-31 MED ORDER — MIDAZOLAM HCL 2 MG/2ML IJ SOLN
1.0000 mg | INTRAMUSCULAR | Status: DC | PRN
  Administered 2013-03-31: 2 mg via INTRAVENOUS

## 2013-03-31 MED ORDER — MIDAZOLAM HCL 5 MG/5ML IJ SOLN
INTRAMUSCULAR | Status: DC | PRN
  Administered 2013-03-31: 2 mg via INTRAVENOUS

## 2013-03-31 MED ORDER — ROCURONIUM BROMIDE 50 MG/5ML IV SOLN
INTRAVENOUS | Status: AC
  Filled 2013-03-31: qty 1

## 2013-03-31 MED ORDER — BUPIVACAINE HCL (PF) 0.5 % IJ SOLN
INTRAMUSCULAR | Status: AC
  Filled 2013-03-31: qty 30

## 2013-03-31 MED ORDER — OXYCODONE-ACETAMINOPHEN 7.5-325 MG PO TABS: 1.0000 | ORAL_TABLET | ORAL | Status: DC | PRN

## 2013-03-31 MED ORDER — GLYCOPYRROLATE 0.2 MG/ML IJ SOLN
INTRAMUSCULAR | Status: AC
  Filled 2013-03-31: qty 1

## 2013-03-31 MED ORDER — ONDANSETRON HCL 4 MG/2ML IJ SOLN: 4.0000 mg | Freq: Once | INTRAMUSCULAR | Status: DC | PRN

## 2013-03-31 MED ORDER — FENTANYL CITRATE 0.05 MG/ML IJ SOLN
INTRAMUSCULAR | Status: DC | PRN
  Administered 2013-03-31: 100 ug via INTRAVENOUS
  Administered 2013-03-31 (×3): 50 ug via INTRAVENOUS
  Administered 2013-03-31: 100 ug via INTRAVENOUS

## 2013-03-31 MED ORDER — SUCCINYLCHOLINE CHLORIDE 20 MG/ML IJ SOLN
INTRAMUSCULAR | Status: DC | PRN
  Administered 2013-03-31: 180 mg via INTRAVENOUS

## 2013-03-31 SURGICAL SUPPLY — 37 items
APPLIER CLIP LAPSCP 10X32 DD (CLIP) ×2 IMPLANT
BAG HAMPER (MISCELLANEOUS) ×2 IMPLANT
BAG SPEC RTRVL LRG 6X4 10 (ENDOMECHANICALS) ×1
CLOTH BEACON ORANGE TIMEOUT ST (SAFETY) ×2 IMPLANT
COVER LIGHT HANDLE STERIS (MISCELLANEOUS) ×4 IMPLANT
DECANTER SPIKE VIAL GLASS SM (MISCELLANEOUS) ×2 IMPLANT
DISSECTOR BLUNT TIP ENDO 5MM (MISCELLANEOUS) ×1 IMPLANT
DURAPREP 26ML APPLICATOR (WOUND CARE) ×2 IMPLANT
ELECT REM PT RETURN 9FT ADLT (ELECTROSURGICAL) ×2
ELECTRODE REM PT RTRN 9FT ADLT (ELECTROSURGICAL) ×1 IMPLANT
FILTER SMOKE EVAC LAPAROSHD (FILTER) ×2 IMPLANT
FORMALIN 10 PREFIL 120ML (MISCELLANEOUS) ×2 IMPLANT
GLOVE BIO SURGEON STRL SZ7.5 (GLOVE) ×2 IMPLANT
GLOVE BIOGEL PI IND STRL 7.0 (GLOVE) IMPLANT
GLOVE BIOGEL PI IND STRL 7.5 (GLOVE) IMPLANT
GLOVE BIOGEL PI INDICATOR 7.0 (GLOVE) ×1
GLOVE BIOGEL PI INDICATOR 7.5 (GLOVE) ×1
GLOVE ECLIPSE 7.0 STRL STRAW (GLOVE) ×2 IMPLANT
GLOVE EXAM NITRILE LRG STRL (GLOVE) ×1 IMPLANT
GOWN STRL REIN XL XLG (GOWN DISPOSABLE) ×6 IMPLANT
HEMOSTAT SNOW SURGICEL 2X4 (HEMOSTASIS) ×2 IMPLANT
INST SET LAPROSCOPIC AP (KITS) ×2 IMPLANT
KIT ROOM TURNOVER APOR (KITS) ×2 IMPLANT
KIT TROCAR LAP CHOLE (TROCAR) ×2 IMPLANT
MANIFOLD NEPTUNE II (INSTRUMENTS) ×2 IMPLANT
NS IRRIG 1000ML POUR BTL (IV SOLUTION) ×2 IMPLANT
PACK LAP CHOLE LZT030E (CUSTOM PROCEDURE TRAY) ×2 IMPLANT
PAD ARMBOARD 7.5X6 YLW CONV (MISCELLANEOUS) ×2 IMPLANT
POUCH SPECIMEN RETRIEVAL 10MM (ENDOMECHANICALS) ×2 IMPLANT
SET BASIN LINEN APH (SET/KITS/TRAYS/PACK) ×2 IMPLANT
SPONGE GAUZE 2X2 8PLY STRL LF (GAUZE/BANDAGES/DRESSINGS) ×8 IMPLANT
STAPLER VISISTAT (STAPLE) ×2 IMPLANT
SUT VICRYL 0 UR6 27IN ABS (SUTURE) ×2 IMPLANT
TAPE CLOTH SURG 4X10 WHT LF (GAUZE/BANDAGES/DRESSINGS) ×1 IMPLANT
TUBING INSUFFLATION (TUBING) ×2 IMPLANT
WARMER LAPAROSCOPE (MISCELLANEOUS) ×2 IMPLANT
YANKAUER SUCT 12FT TUBE ARGYLE (SUCTIONS) ×2 IMPLANT

## 2013-03-31 NOTE — Anesthesia Postprocedure Evaluation (Signed)
  Anesthesia Post-op Note  Patient: [name]  Procedure(s) Performed: Procedure(s): LAPAROSCOPIC CHOLECYSTECTOMY (N/A)  Patient Location: PACU  Anesthesia Type:General  Level of Consciousness: awake, alert , oriented and patient cooperative  Airway and Oxygen Therapy: Patient Spontanous Breathing  Post-op Pain: 3 /10, mild  Post-op Assessment: Post-op Vital signs reviewed, Patient's Cardiovascular Status Stable, Respiratory Function Stable, Patent Airway and Pain level controlled  Post-op Vital Signs: Reviewed and stable  Complications: No apparent anesthesia complications

## 2013-03-31 NOTE — Anesthesia Procedure Notes (Signed)
Procedure Name: Intubation Date/Time: 03/31/2013 7:44 AM Performed by: Despina Hidden Pre-anesthesia Checklist: Emergency Drugs available, Suction available, Patient being monitored and Patient identified Patient Re-evaluated:Patient Re-evaluated prior to inductionOxygen Delivery Method: Circle system utilized Preoxygenation: Pre-oxygenation with 100% oxygen Intubation Type: IV induction, Cricoid Pressure applied and Rapid sequence Ventilation: Mask ventilation without difficulty Laryngoscope Size: 3 and Mac Grade View: Grade II Tube type: Oral Tube size: 7.0 mm Number of attempts: 1 Airway Equipment and Method: Stylet Placement Confirmation: ETT inserted through vocal cords under direct vision,  positive ETCO2 and breath sounds checked- equal and bilateral Secured at: 22 cm Tube secured with: Tape Dental Injury: Teeth and Oropharynx as per pre-operative assessment

## 2013-03-31 NOTE — Op Note (Signed)
Patient:  [name]  DOB:  11/12/1965  MRN:  [id number]   Preop Diagnosis:  Chronic cholecystitis  Postop Diagnosis:  Same  Procedure:  Laparoscopic cholecystectomy  Surgeon:  Franky Macho, M.D.  Anes:  General endotracheal  Indications:  Patient is a 47 year old white female who presents with biliary colic secondary to chronic cholecystitis. The risks and benefits of the procedure including bleeding, infection, hepatobiliary injury, the possibility of an open procedure were fully explained to the patient, who gave informed consent.  Procedure note:  The patient is placed the supine position. After induction of general endotracheal anesthesia, the abdomen was prepped and draped using usual sterile technique with DuraPrep. Surgical site confirmation was performed.  A supraumbilical incision was made down to the fascia. A Veress needle was introduced into the abdominal cavity and confirmation of placement was done using the saline drop test. The abdomen was then insufflated to 16 mm mercury pressure. An 11 mm trocar was introduced into the abdominal cavity under direct visualization without difficulty. The patient was placed in reverse Trendelenburg position and additional 11 mm trocar was placed the epigastric region and 5 mm trochars were placed the right upper quadrant right flank regions.down to the fascia.The liver was inspected and noted within normal limits. The gallbladder was retracted in a dynamic fashion in order to expose the triangle of Calot. The cystic duct was first identified. Its junction to the infundibulum was fully identified. Endoclips were placed proximally and distally on the cystic duct, and the cystic duct was divided. This was likewise done to the cystic artery. The gallbladder was then freed away from the gallbladder fossa using Bovie electrocautery. The gallbladder was delivered through the epigastric trocar site using an Endo Catch bag. The gallbladder fossa  was inspected and no abnormal bleeding or bile leakage was noted. Surgicel is placed the gallbladder fossa. Mcintyre fluid and air were then evacuated from the abdominal cavity prior to removal of the trochars.  Mcintyre wounds were gave normal saline. Mcintyre wounds were injected with 0.5% Sensorcaine. The suprabuccal fascia was reapproximated using 0 Vicryl interrupted suture. Mcintyre skin incisions were closed using staples. Betadine ointment and dry sterile dressings were applied.  Mcintyre tape and needle counts were correct at the end of the procedure. Patient was extubated in the operating room and transferred too PACU in stable condition.  Complications:  none  EBL:  minimal  Specimen:  Gallbladder

## 2013-03-31 NOTE — Anesthesia Preprocedure Evaluation (Signed)
Anesthesia Evaluation  Patient identified by MRN, date of birth, ID band Patient awake    Reviewed: Allergy & Precautions, H&P , NPO status , Patient's Chart, lab work & pertinent test results, reviewed documented beta blocker date and time   History of Anesthesia Complications Negative for: history of anesthetic complications  Airway Mallampati: II TM Distance: >3 FB Neck ROM: full    Dental no notable dental hx. (+) Teeth Intact and Dental Advidsory Given   Pulmonary asthma (last albuterol a few months ago) , sleep apnea and Continuous Positive Airway Pressure Ventilation ,  breath sounds clear to auscultation  Pulmonary exam normal       Cardiovascular hypertension, On Medications Rhythm:regular Rate:Normal     Neuro/Psych Anxiety    GI/Hepatic Neg liver ROS, PUD,   Endo/Other  diabetes (glu 113), Well Controlled, Type obesity  Renal/GU negative Renal ROS     Musculoskeletal  (+) Arthritis -,   Abdominal (+) + obese,   Peds  Hematology   Anesthesia Other Findings   Reproductive/Obstetrics On Depo preg test NEG                           Anesthesia Physical Anesthesia Plan  ASA: III  Anesthesia Plan: General   Post-op Pain Management:    Induction: Intravenous  Airway Management Planned: Oral ETT  Additional Equipment:   Intra-op Plan:   Post-operative Plan: Extubation in OR  Informed Consent: I have reviewed the patients History and Physical, chart, labs and discussed the procedure including the risks, benefits and alternatives for the proposed anesthesia with the patient or authorized representative who has indicated his/her understanding and acceptance.   Dental Advisory Given  Plan Discussed with: CRNA, Anesthesiologist and Surgeon  Anesthesia Plan Comments: (Plan routine monitors, GETA )        Anesthesia Quick Evaluation

## 2013-03-31 NOTE — Interval H&P Note (Signed)
History and Physical Interval Note:  03/31/2013 7:28 AM  [name]  has presented today for surgery, with the diagnosis of chronic cholecystitis  The various methods of treatment have been discussed with the patient and family. After consideration of risks, benefits and other options for treatment, the patient has consented to  Procedure(s): LAPAROSCOPIC CHOLECYSTECTOMY (N/A) as a surgical intervention .  The patient's history has been reviewed, patient examined, no change in status, stable for surgery.  I have reviewed the patient's chart and labs.  Questions were answered to the patient's satisfaction.     Franky Macho A

## 2013-03-31 NOTE — Transfer of Care (Signed)
Immediate Anesthesia Transfer of Care Note  Patient: [name]  Procedure(s) Performed: Procedure(s): LAPAROSCOPIC CHOLECYSTECTOMY (N/A)  Patient Location: PACU  Anesthesia Type:General  Level of Consciousness: awake and patient cooperative  Airway & Oxygen Therapy: Patient Spontanous Breathing and Patient connected to face mask oxygen  Post-op Assessment: Report given to PACU RN, Post -op Vital signs reviewed and stable and Patient moving all extremities  Post vital signs: Reviewed and stable  Complications: No apparent anesthesia complications

## 2013-04-03 ENCOUNTER — Encounter (HOSPITAL_COMMUNITY): Payer: Self-pay | Admitting: General Surgery

## 2013-04-17 ENCOUNTER — Encounter (INDEPENDENT_AMBULATORY_CARE_PROVIDER_SITE_OTHER): Payer: Self-pay

## 2013-07-18 ENCOUNTER — Ambulatory Visit (INDEPENDENT_AMBULATORY_CARE_PROVIDER_SITE_OTHER): Payer: BC Managed Care – PPO | Admitting: Internal Medicine

## 2013-07-26 ENCOUNTER — Ambulatory Visit (INDEPENDENT_AMBULATORY_CARE_PROVIDER_SITE_OTHER): Payer: BC Managed Care – PPO | Admitting: Internal Medicine

## 2013-07-26 ENCOUNTER — Encounter (INDEPENDENT_AMBULATORY_CARE_PROVIDER_SITE_OTHER): Payer: Self-pay | Admitting: Internal Medicine

## 2013-07-26 VITALS — BP 98/40 | HR 84 | Temp 98.4°F | Ht 63.0 in | Wt 308.4 lb

## 2013-07-26 DIAGNOSIS — K5090 Crohn's disease, unspecified, without complications: Secondary | ICD-10-CM

## 2013-07-26 DIAGNOSIS — K5010 Crohn's disease of large intestine without complications: Secondary | ICD-10-CM

## 2013-07-26 MED ORDER — ALPRAZOLAM 0.5 MG PO TABS: 0.2500 mg | ORAL_TABLET | Freq: Every evening | ORAL | Status: DC | PRN

## 2013-07-26 NOTE — Progress Notes (Signed)
Subjective:     Patient ID: [name], female   DOB: 04/13/1966, 47 y.o.   MRN: [id number]   HPI  [name] is a 47 yr old female here today for f/u of her Crohn's.  Says she is doing great. No abdominal pain.  She has a BM x 1-2 a day. Presently taking Pentasa for her Crohn's. Appetite is good. No weight loss. She is exercising on a bike. Hx of rt hip replacement  She underwent a colonoscopy in July 2014: Normal mucosa of terminal ileum.  Focal scarring in the region of hepatic flexure and cluster of small polyps felt to be pseudopolyps. Biopsy taken.  Small external hemorrhoids.  Crohn's colitis is in remission.   Biopsy reveals normal mucosa with lymphoid aggregates. No evidence of dysplasia or adenoma. Results reviewed with patient. Decrease 6 MP to 25 mg daily for one month and then stop. No more CBCs OV in 4 months.  Colonoscopy 2008:  Active disease in the distal colon and she had a few polyps at transverse colon and all of and all of these non adenomatous.    Review of Systems see hpi Current Outpatient Prescriptions  Medication Sig Dispense Refill  . albuterol (PROVENTIL,VENTOLIN) 90 MCG/ACT inhaler Inhale 2 puffs into the lungs every 6 (six) hours as needed. Shortness of breath      . ALPRAZolam (XANAX) 0.5 MG tablet Take 0.5 tablets (0.25 mg total) by mouth at bedtime as needed. For sleep  60 tablet  0  . atorvastatin (LIPITOR) 10 MG tablet Take 10 mg by mouth daily.      . cetirizine (ZYRTEC) 10 MG tablet Take 10 mg by mouth daily.      . citalopram (CELEXA) 20 MG tablet Take 20 mg by mouth daily.      Marland Kitchen diltiazem (CARDIZEM CD) 240 MG 24 hr capsule Take 300 mg by mouth daily.       . Fluticasone-Salmeterol (ADVAIR) 250-50 MCG/DOSE AEPB Inhale 1 puff into the lungs daily.      Marland Kitchen lisinopril-hydrochlorothiazide (PRINZIDE,ZESTORETIC) 20-12.5 MG per tablet Take 2 tablets by mouth daily.      . medroxyPROGESTERone (DEPO-PROVERA) 150 MG/ML injection Inject 150 mg  into the muscle every 3 (three) months.      . mesalamine (PENTASA) 500 MG CR capsule Take 2,000 mg by mouth 2 (two) times daily.      . Multiple Vitamin (MULITIVITAMIN WITH MINERALS) TABS Take 1 tablet by mouth daily.       No current facility-administered medications for this visit.   Past Surgical History  Procedure Laterality Date  . Tonsillectomy    . Cyst removed from left hand    . Total hip arthroplasty  04/18/2012    Procedure: TOTAL HIP ARTHROPLASTY;  Surgeon: Eldred Manges, MD;  Location: [facility] OR;  Service: Orthopedics;  Laterality: Right;  Right Total Hip Arthroplasty  . Colonoscopy N/A 03/16/2013    Procedure: COLONOSCOPY;  Surgeon: Malissa Hippo, MD;  Location: [facility] ENDO SUITE;  Service: Endoscopy;  Laterality: N/A;  255  . Cholecystectomy N/A 03/31/2013    Procedure: LAPAROSCOPIC CHOLECYSTECTOMY;  Surgeon: Dalia Heading, MD;  Location: [facility] ORS;  Service: General;  Laterality: N/A;   Allergies  Allergen Reactions  . Remicade [Infliximab] Swelling    Joints locked up  . Cefprozil Rash  . Penicillins Rash        Objective:   Physical Exam  Filed Vitals:   07/26/13 0919  BP: 98/40  Pulse: 84  Temp: 98.4 F (36.9 C)  Height: 5\' 3"  (1.6 m)  Weight: 308 lb 6.4 oz (139.889 kg)       Alert and oriented. Skin warm and dry. Oral mucosa is moist.   . Sclera anicteric, conjunctivae is pink. Thyroid not enlarged. No cervical lymphadenopathy. Lungs clear. Heart regular rate and rhythm.  Abdomen is soft. Bowel sounds are positive. No hepatomegaly. No abdominal masses felt. No tenderness. Abdomen obese. No edema to lower extremities.    Assessment:    Crohn's disease. She appears to be in remission at this time. Last colonoscopy in July of this revealed Crohn's remission.    Plan:    Continue the Pentasa. OV in 1 year. If any problems call our office. Rx for Xanax 0.50 refilled # 60, no refills.

## 2013-07-26 NOTE — Patient Instructions (Signed)
OV 1 yr. If any problems call our office.

## 2013-09-25 ENCOUNTER — Encounter (HOSPITAL_COMMUNITY): Payer: Self-pay | Admitting: Emergency Medicine

## 2013-09-25 ENCOUNTER — Emergency Department (HOSPITAL_COMMUNITY): Payer: BC Managed Care – PPO

## 2013-09-25 ENCOUNTER — Emergency Department (HOSPITAL_COMMUNITY)
Admission: EM | Admit: 2013-09-25 | Discharge: 2013-09-25 | Disposition: A | Discharge status: Home / Self Care | Payer: BC Managed Care – PPO | Attending: Emergency Medicine | Admitting: Emergency Medicine

## 2013-09-25 DIAGNOSIS — R110 Nausea: Secondary | ICD-10-CM | POA: Insufficient documentation

## 2013-09-25 DIAGNOSIS — Z8719 Personal history of other diseases of the digestive system: Secondary | ICD-10-CM | POA: Insufficient documentation

## 2013-09-25 DIAGNOSIS — Z79899 Other long term (current) drug therapy: Secondary | ICD-10-CM | POA: Insufficient documentation

## 2013-09-25 DIAGNOSIS — J45909 Unspecified asthma, uncomplicated: Secondary | ICD-10-CM | POA: Insufficient documentation

## 2013-09-25 DIAGNOSIS — Z9089 Acquired absence of other organs: Secondary | ICD-10-CM | POA: Insufficient documentation

## 2013-09-25 DIAGNOSIS — N12 Tubulo-interstitial nephritis, not specified as acute or chronic: Secondary | ICD-10-CM | POA: Insufficient documentation

## 2013-09-25 DIAGNOSIS — IMO0002 Reserved for concepts with insufficient information to code with codable children: Secondary | ICD-10-CM | POA: Insufficient documentation

## 2013-09-25 DIAGNOSIS — F411 Generalized anxiety disorder: Secondary | ICD-10-CM | POA: Insufficient documentation

## 2013-09-25 DIAGNOSIS — E7800 Pure hypercholesterolemia, unspecified: Secondary | ICD-10-CM | POA: Insufficient documentation

## 2013-09-25 DIAGNOSIS — Z880 Allergy status to penicillin: Secondary | ICD-10-CM | POA: Insufficient documentation

## 2013-09-25 DIAGNOSIS — Z3202 Encounter for pregnancy test, result negative: Secondary | ICD-10-CM | POA: Insufficient documentation

## 2013-09-25 DIAGNOSIS — Z8739 Personal history of other diseases of the musculoskeletal system and connective tissue: Secondary | ICD-10-CM | POA: Insufficient documentation

## 2013-09-25 DIAGNOSIS — E119 Type 2 diabetes mellitus without complications: Secondary | ICD-10-CM | POA: Insufficient documentation

## 2013-09-25 DIAGNOSIS — I10 Essential (primary) hypertension: Secondary | ICD-10-CM | POA: Insufficient documentation

## 2013-09-25 LAB — BASIC METABOLIC PANEL
BUN: 9 mg/dL (ref 6–23)
CO2: 27 mEq/L (ref 19–32)
Calcium: 9.2 mg/dL (ref 8.4–10.5)
Chloride: 101 mEq/L (ref 96–112)
Creatinine, Ser: 0.69 mg/dL (ref 0.50–1.10)
GFR calc Af Amer: >90 mL/min (ref >90)
GFR calc non Af Amer: >90 mL/min (ref >90)
Glucose, Bld: 99 mg/dL (ref 70–99)
Potassium: 3.7 mEq/L (ref 3.7–5.3)
Sodium: 141 mEq/L (ref 137–147)

## 2013-09-25 LAB — CBC WITH DIFFERENTIAL/PLATELET
Basophils Absolute: 0 10*3/uL (ref 0.0–0.1)
Basophils Relative: 0 % (ref 0–1)
Eosinophils Absolute: 0.1 10*3/uL (ref 0.0–0.7)
Eosinophils Relative: 1 % (ref 0–5)
HCT: 42.6 % (ref 36.0–46.0)
Hemoglobin: 14.5 g/dL (ref 12.0–15.0)
Lymphocytes Relative: 25 % (ref 12–46)
Lymphs Abs: 2.2 10*3/uL (ref 0.7–4.0)
MCH: 32.2 pg (ref 26.0–34.0)
MCHC: 34 g/dL (ref 30.0–36.0)
MCV: 94.7 fL (ref 78.0–100.0)
Monocytes Absolute: 0.7 10*3/uL (ref 0.1–1.0)
Monocytes Relative: 8 % (ref 3–12)
Neutro Abs: 5.8 10*3/uL (ref 1.7–7.7)
Neutrophils Relative %: 66 % (ref 43–77)
Platelets: 228 10*3/uL (ref 150–400)
RBC: 4.5 MIL/uL (ref 3.87–5.11)
RDW: 12.1 % (ref 11.5–15.5)
WBC: 8.9 10*3/uL (ref 4.0–10.5)

## 2013-09-25 LAB — PREGNANCY, URINE: Preg Test, Ur: NEGATIVE

## 2013-09-25 LAB — URINALYSIS, ROUTINE W REFLEX MICROSCOPIC
Bilirubin Urine: NEGATIVE
Glucose, UA: NEGATIVE mg/dL
Hgb urine dipstick: NEGATIVE
Ketones, ur: NEGATIVE mg/dL
Nitrite: NEGATIVE
Protein, ur: NEGATIVE mg/dL
Specific Gravity, Urine: 1.025 (ref 1.005–1.030)
Urobilinogen, UA: 0.2 mg/dL (ref 0.0–1.0)
pH: 6 (ref 5.0–8.0)

## 2013-09-25 LAB — URINE MICROSCOPIC-ADD ON

## 2013-09-25 LAB — LIPASE, BLOOD: Lipase: 27 U/L (ref 11–59)

## 2013-09-25 MED ORDER — TRAMADOL HCL 50 MG PO TABS: 50.0000 mg | ORAL_TABLET | Freq: Four times a day (QID) | ORAL | Status: DC | PRN

## 2013-09-25 MED ORDER — CIPROFLOXACIN HCL 250 MG PO TABS
ORAL_TABLET | ORAL | Status: AC
  Administered 2013-09-25: 500 mg
  Filled 2013-09-25: qty 2

## 2013-09-25 MED ORDER — CIPROFLOXACIN HCL 500 MG PO TABS: 500.0000 mg | ORAL_TABLET | Freq: Two times a day (BID) | ORAL | Status: DC

## 2013-09-25 MED ORDER — CIPROFLOXACIN IN D5W 400 MG/200ML IV SOLN: 400.0000 mg | Freq: Once | INTRAVENOUS | Status: DC

## 2013-09-25 NOTE — Discharge Instructions (Signed)
Pyelonephritis, Adult Pyelonephritis is a kidney infection. In general, there are 2 main types of pyelonephritis:  Infections that come on quickly without any warning (acute pyelonephritis).  Infections that persist for a long period of time (chronic pyelonephritis). CAUSES  Two main causes of pyelonephritis are:  Bacteria traveling from the bladder to the kidney. This is a problem especially in pregnant women. The urine in the bladder can become filled with bacteria from multiple causes, including:  Inflammation of the prostate gland (prostatitis).  Sexual intercourse in females.  Bladder infection (cystitis).  Bacteria traveling from the bloodstream to the tissue part of the kidney. Problems that may increase your risk of getting a kidney infection include:  Diabetes.  Kidney stones or bladder stones.  Cancer.  Catheters placed in the bladder.  Other abnormalities of the kidney or ureter. SYMPTOMS   Abdominal pain.  Pain in the side or flank area.  Fever.  Chills.  Upset stomach.  Blood in the urine (dark urine).  Frequent urination.  Strong or persistent urge to urinate.  Burning or stinging when urinating. DIAGNOSIS  Your caregiver may diagnose your kidney infection based on your symptoms. A urine sample may also be taken. TREATMENT  In general, treatment depends on how severe the infection is.   If the infection is mild and caught early, your caregiver may treat you with oral antibiotics and send you home.  If the infection is more severe, the bacteria may have gotten into the bloodstream. This will require intravenous (IV) antibiotics and a hospital stay. Symptoms may include:  High fever.  Severe flank pain.  Shaking chills.  Even after a hospital stay, your caregiver may require you to be on oral antibiotics for a period of time.  Other treatments may be required depending upon the cause of the infection. HOME CARE INSTRUCTIONS   Take your  antibiotics as directed. Finish them even if you start to feel better.  Make an appointment to have your urine checked to make sure the infection is gone.  Drink enough fluids to keep your urine clear or pale yellow.  Take medicines for the bladder if you have urgency and frequency of urination as directed by your caregiver. SEEK IMMEDIATE MEDICAL CARE IF:   You have a fever or persistent symptoms for more than 2-3 days.  You have a fever and your symptoms suddenly get worse.  You are unable to take your antibiotics or fluids.  You develop shaking chills.  You experience extreme weakness or fainting.  There is no improvement after 2 days of treatment. MAKE SURE YOU:  Understand these instructions.  Will watch your condition.  Will get help right away if you are not doing well or get worse. Document Released: 08/17/2005 Document Revised: 02/16/2012 Document Reviewed: 01/21/2011 [facility] Patient Information 2014 [place], [place].

## 2013-09-25 NOTE — ED Notes (Signed)
Rt upper abd/side pain that radiates to pt back. C/o periods of nausea as well.

## 2013-09-26 LAB — URINE CULTURE
Colony Count: NO GROWTH
Culture: NO GROWTH

## 2013-09-28 NOTE — ED Provider Notes (Signed)
CSN: [id number]     Arrival date & time 09/25/13  1449 History   First MD Initiated Contact with Patient 09/25/13 1818     Chief Complaint  Patient presents with  . Abdominal Pain  . Back Pain   (Consider location/radiation/quality/duration/timing/severity/associated sxs/prior Treatment) HPI  48 year old female with right flank pain. Onset yesterday. Initially intermittent, but now more or less constant. Radiates into her right back. Nausea when the pain is more severe. No vomiting. No fevers or chills. Increased urinary frequency. Unusual vaginal bleeding or discharge. She is status post cholecystectomy.   Past Medical History  Diagnosis Date  . Crohn disease   . High cholesterol   . Diabetes mellitus     diet controlled  . Hypertension     pcp  Dr Coralyn Mark Quillian Quince    [place]  . Asthma   . Anxiety   . Sleep apnea 03-2009 study    pt forgot to mention at pre admit visit.  uses c-pap brought her own  machine from home.   . Arthritis    Past Surgical History  Procedure Laterality Date  . Tonsillectomy    . Cyst removed from left hand    . Total hip arthroplasty  04/18/2012    Procedure: TOTAL HIP ARTHROPLASTY;  Surgeon: Marybelle Killings, MD;  Location: [facility];  Service: Orthopedics;  Laterality: Right;  Right Total Hip Arthroplasty  . Colonoscopy N/A 03/16/2013    Procedure: COLONOSCOPY;  Surgeon: Rogene Houston, MD;  [facility];  Service: Endoscopy;  Laterality: N/A;  255  . Cholecystectomy N/A 03/31/2013    Procedure: LAPAROSCOPIC CHOLECYSTECTOMY;  Surgeon: Jamesetta So, MD;  Location: [facility];  Service: General;  Laterality: N/A;   No family history on file. History  Substance Use Topics  . Smoking status: Never Smoker   . Smokeless tobacco: Not on file  . Alcohol Use: No   OB History   Grav Para Term Preterm Abortions TAB SAB Ect Mult Living                 Review of Systems  All systems reviewed and negative, other than as noted in HPI.   Allergies  Remicade;  Cefprozil; and Penicillins  Home Medications   Current Outpatient Rx  Name  Route  Sig  Dispense  Refill  . albuterol (PROVENTIL,VENTOLIN) 90 MCG/ACT inhaler   Inhalation   Inhale 2 puffs into the lungs every 6 (six) hours as needed. Shortness of breath         . ALPRAZolam (XANAX) 0.5 MG tablet   Oral   Take 0.5 tablets (0.25 mg total) by mouth at bedtime as needed. For sleep   60 tablet   0   . atorvastatin (LIPITOR) 10 MG tablet   Oral   Take 10 mg by mouth daily.         . cetirizine (ZYRTEC) 10 MG tablet   Oral   Take 10 mg by mouth daily.         . citalopram (CELEXA) 20 MG tablet   Oral   Take 20 mg by mouth daily.         Marland Kitchen diltiazem (CARDIZEM CD) 240 MG 24 hr capsule   Oral   Take 300 mg by mouth daily.          . Fluticasone-Salmeterol (ADVAIR) 250-50 MCG/DOSE AEPB   Inhalation   Inhale 1 puff into the lungs daily.         Marland Kitchen  mesalamine (PENTASA) 500 MG CR capsule   Oral   Take 2,000 mg by mouth 2 (two) times daily.         . Multiple Vitamin (MULITIVITAMIN WITH MINERALS) TABS   Oral   Take 1 tablet by mouth daily.         . ciprofloxacin (CIPRO) 500 MG tablet   Oral   Take 1 tablet (500 mg total) by mouth every 12 (twelve) hours.   10 tablet   0   . cloNIDine (CATAPRES - DOSED IN MG/24 HR) 0.1 mg/24hr patch   Transdermal   Place 1 patch onto the skin once a week. On Friday         . medroxyPROGESTERone (DEPO-PROVERA) 150 MG/ML injection   Intramuscular   Inject 150 mg into the muscle every 3 (three) months.         . traMADol (ULTRAM) 50 MG tablet   Oral   Take 1 tablet (50 mg total) by mouth every 6 (six) hours as needed.   10 tablet   0    BP 144/61  Pulse 78  Temp(Src) 98.4 F (36.9 C) (Oral)  Resp 16  SpO2 96% Physical Exam  Nursing note and vitals reviewed. Constitutional: She appears well-developed and well-nourished. No distress.  HENT:  Head: Normocephalic and atraumatic.  Eyes: Conjunctivae are normal.  Right eye exhibits no discharge. Left eye exhibits no discharge.  Neck: Neck supple.  Cardiovascular: Normal rate, regular rhythm and normal heart sounds.  Exam reveals no gallop and no friction rub.   No murmur heard. Pulmonary/Chest: Effort normal and breath sounds normal. No respiratory distress.  Abdominal: Soft. She exhibits no distension. There is no tenderness.  Musculoskeletal: She exhibits no edema and no tenderness.  Neurological: She is alert.  Skin: Skin is warm and dry.  Psychiatric: She has a normal mood and affect. Her behavior is normal. Thought content normal.    ED Course  Procedures (including critical care time) Labs Review Labs Reviewed  URINALYSIS, ROUTINE W REFLEX MICROSCOPIC - Abnormal; Notable for the following:    APPearance HAZY (*)    Leukocytes, UA SMALL (*)    All other components within normal limits  URINE MICROSCOPIC-ADD ON - Abnormal; Notable for the following:    Squamous Epithelial / LPF FEW (*)    Bacteria, UA FEW (*)    All other components within normal limits  URINE CULTURE  CBC WITH DIFFERENTIAL  BASIC METABOLIC PANEL  PREGNANCY, URINE  LIPASE, BLOOD   Imaging Review No results found.  EKG Interpretation    Date/Time:  Monday September 25 2013 15:15:25 EST Ventricular Rate:  75 PR Interval:  186 QRS Duration: 76 QT Interval:  388 QTC Calculation: 433 R Axis:   47 Text Interpretation:  Normal sinus rhythm Normal ECG When compared with ECG of 29-Mar-2013 14:08, No significant change was found ED PHYSICIAN INTERPRETATION AVAILABLE IN [facility] Confirmed by TEST, RECORD ([id number]) on 09/27/2013 6:57:57 AM            MDM   1. Pyelonephritis    48 year old female with right flank pain. Suspect pyelonephritis. Hemodynamically stable. No vomiting. I feel she is appropriate for outpatient treatment. Return precautions were discussed.    Virgel Manifold, MD 09/28/13 [phone]

## 2013-09-29 IMAGING — CR DG HIP OPERATIVE*R*
1 series · 1 of 1 positions shown · non-contrast
Comparison: None.

CLINICAL DATA: Right total arthroplasty.

DG OPERATIVE RIGHT HIP

[AP]
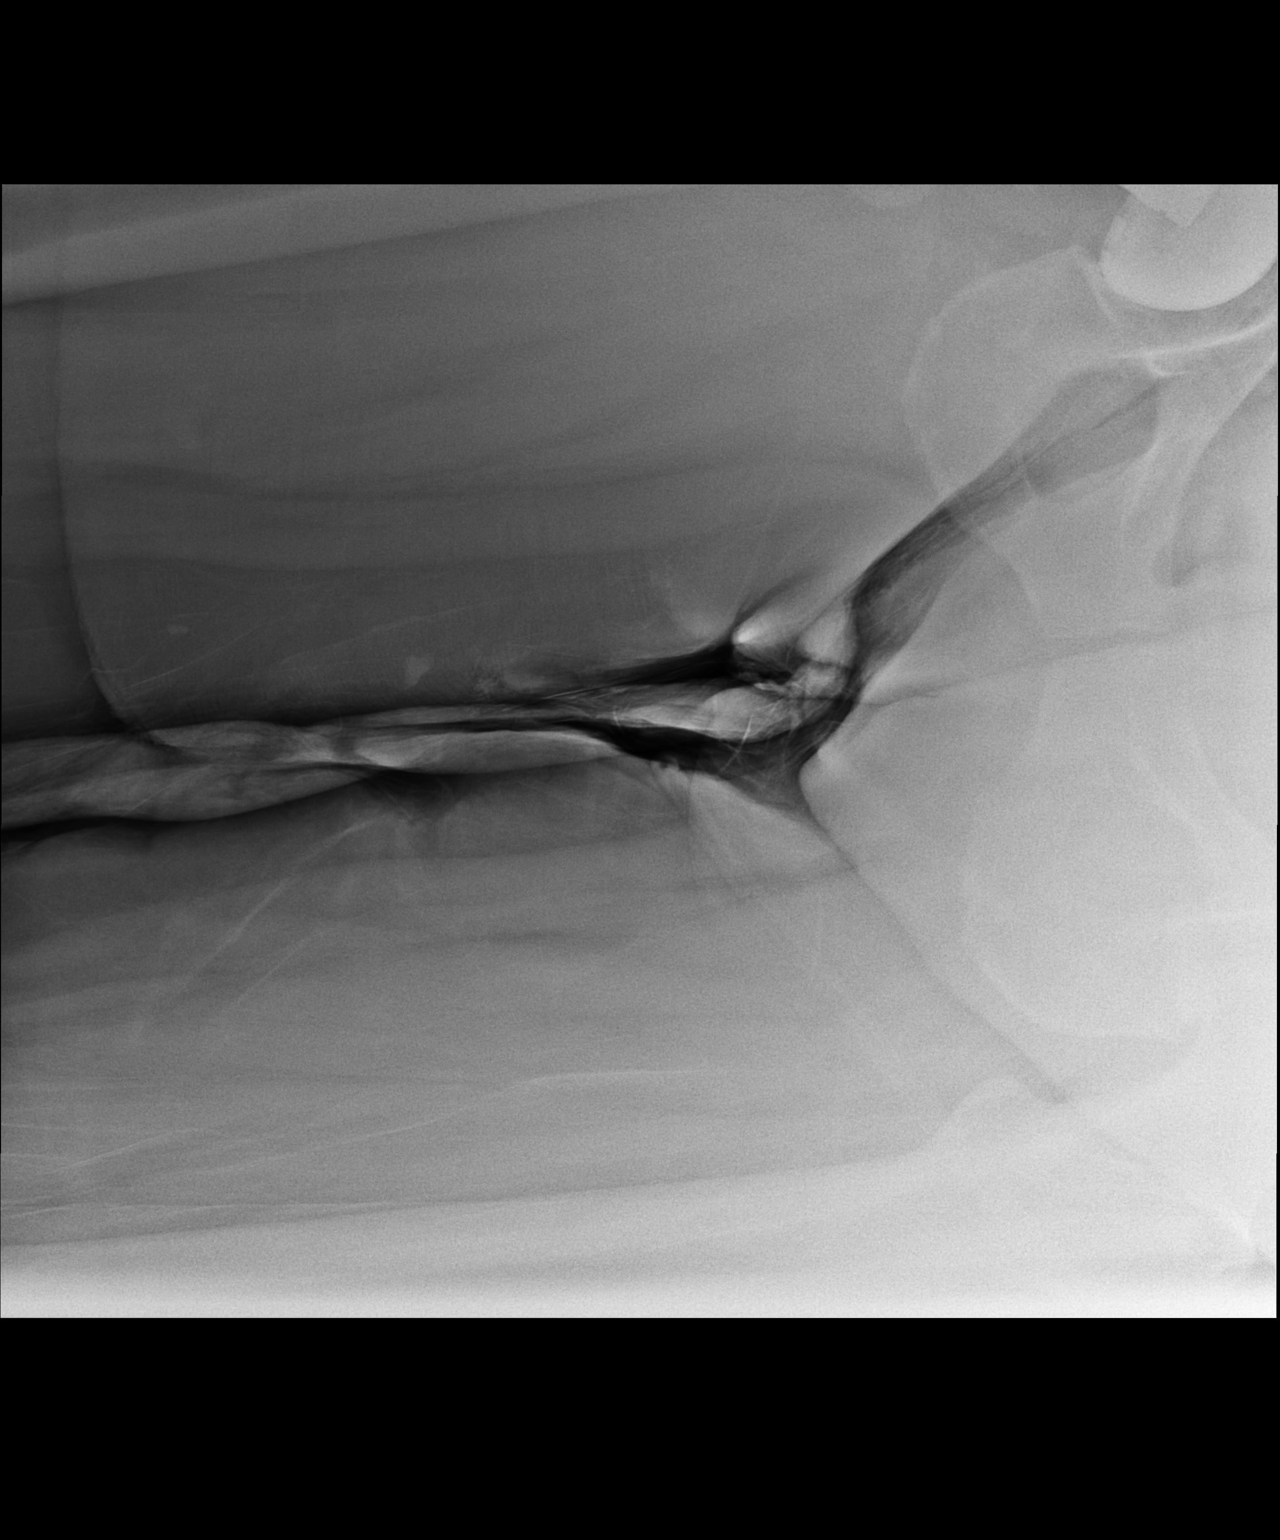

[1 of 1 positions shown; findings below may reference images not displayed]

FINDINGS: Single intraoperative view of the right hip submitted for
review after surgery.

Right total hip prosthesis has been placed.  The full extent of the
acetabular component is not included on the present examination.
No obvious complication.

Radiopaque structure near the acetabular component may represent an
instrument.  Clinical correlation recommended.
IMPRESSION: Right total hip prosthesis has been placed.  The full extent of the
acetabular component is not included on the present examination.
No obvious complication.

Radiopaque structure near the acetabular component may represent an
instrument.  Clinical correlation recommended.

## 2013-12-04 ENCOUNTER — Other Ambulatory Visit (INDEPENDENT_AMBULATORY_CARE_PROVIDER_SITE_OTHER): Payer: Self-pay | Admitting: Internal Medicine

## 2013-12-04 DIAGNOSIS — K5010 Crohn's disease of large intestine without complications: Secondary | ICD-10-CM

## 2013-12-04 MED ORDER — MESALAMINE ER 500 MG PO CPCR: 1000.0000 mg | ORAL_CAPSULE | Freq: Four times a day (QID) | ORAL | Status: DC

## 2014-01-09 ENCOUNTER — Telehealth (INDEPENDENT_AMBULATORY_CARE_PROVIDER_SITE_OTHER): Payer: Self-pay | Admitting: Internal Medicine

## 2014-01-09 DIAGNOSIS — K5090 Crohn's disease, unspecified, without complications: Secondary | ICD-10-CM

## 2014-01-09 MED ORDER — ALPRAZOLAM 0.5 MG PO TABS: 0.2500 mg | ORAL_TABLET | Freq: Every evening | ORAL | Status: DC | PRN

## 2014-01-09 NOTE — Telephone Encounter (Signed)
Rx for xanax. PCP to fill.

## 2014-02-05 ENCOUNTER — Telehealth (INDEPENDENT_AMBULATORY_CARE_PROVIDER_SITE_OTHER): Payer: Self-pay | Admitting: *Deleted

## 2014-02-05 NOTE — Telephone Encounter (Signed)
Patient needs XANAX refilled. Patient would like Dr. Laural Golden to refill this because there was a problem the last time with Terri.

## 2014-02-09 ENCOUNTER — Other Ambulatory Visit (INDEPENDENT_AMBULATORY_CARE_PROVIDER_SITE_OTHER): Payer: Self-pay | Admitting: *Deleted

## 2014-02-09 DIAGNOSIS — K5090 Crohn's disease, unspecified, without complications: Secondary | ICD-10-CM

## 2014-02-09 MED ORDER — ALPRAZOLAM 0.5 MG PO TABS: 0.2500 mg | ORAL_TABLET | Freq: Two times a day (BID) | ORAL | Status: DC

## 2014-02-09 NOTE — Telephone Encounter (Signed)
This refill request has been discussed with Dr.[name] and addressed with Halina Maidens. Patient has been called ,made aware that prescription has been called in.

## 2014-02-09 NOTE — Telephone Encounter (Signed)
Disregard Terri, being sent to Dr. Laural Golden.

## 2014-02-09 NOTE — Telephone Encounter (Signed)
Per Dr.[name] may refill and give 2 additional refills. This was called to [facility] Patient was called and made aware.

## 2014-04-18 ENCOUNTER — Encounter (INDEPENDENT_AMBULATORY_CARE_PROVIDER_SITE_OTHER): Payer: Self-pay | Admitting: *Deleted

## 2014-07-11 ENCOUNTER — Ambulatory Visit (INDEPENDENT_AMBULATORY_CARE_PROVIDER_SITE_OTHER): Payer: BC Managed Care – PPO | Admitting: Internal Medicine

## 2014-07-11 ENCOUNTER — Encounter (INDEPENDENT_AMBULATORY_CARE_PROVIDER_SITE_OTHER): Payer: Self-pay | Admitting: Internal Medicine

## 2014-07-11 ENCOUNTER — Encounter (INDEPENDENT_AMBULATORY_CARE_PROVIDER_SITE_OTHER): Payer: Self-pay | Admitting: *Deleted

## 2014-07-11 VITALS — BP 102/50 | HR 76 | Temp 97.9°F | Ht 63.5 in | Wt 299.6 lb

## 2014-07-11 DIAGNOSIS — K50918 Crohn's disease, unspecified, with other complication: Secondary | ICD-10-CM

## 2014-07-11 LAB — CBC WITH DIFFERENTIAL/PLATELET
Basophils Absolute: 0 10*3/uL (ref 0.0–0.1)
Basophils Relative: 0 % (ref 0–1)
Eosinophils Absolute: 0 10*3/uL (ref 0.0–0.7)
Eosinophils Relative: 0 % (ref 0–5)
HCT: 46 % (ref 36.0–46.0)
Hemoglobin: 15.2 g/dL — ABNORMAL HIGH (ref 12.0–15.0)
Lymphocytes Relative: 10 % — ABNORMAL LOW (ref 12–46)
Lymphs Abs: 1.6 10*3/uL (ref 0.7–4.0)
MCH: 31.7 pg (ref 26.0–34.0)
MCHC: 33 g/dL (ref 30.0–36.0)
MCV: 96 fL (ref 78.0–100.0)
Monocytes Absolute: 0.5 10*3/uL (ref 0.1–1.0)
Monocytes Relative: 3 % (ref 3–12)
Neutro Abs: 13.5 10*3/uL — ABNORMAL HIGH (ref 1.7–7.7)
Neutrophils Relative %: 87 % — ABNORMAL HIGH (ref 43–77)
Platelets: 307 10*3/uL (ref 150–400)
RBC: 4.79 MIL/uL (ref 3.87–5.11)
RDW: 13 % (ref 11.5–15.5)
WBC: 15.5 10*3/uL — ABNORMAL HIGH (ref 4.0–10.5)

## 2014-07-11 MED ORDER — METRONIDAZOLE 500 MG PO TABS: 500.0000 mg | ORAL_TABLET | Freq: Two times a day (BID) | ORAL | Status: DC

## 2014-07-11 MED ORDER — CIPROFLOXACIN HCL 500 MG PO TABS: 500.0000 mg | ORAL_TABLET | Freq: Two times a day (BID) | ORAL | Status: DC

## 2014-07-11 NOTE — Progress Notes (Signed)
Subjective:    Patient ID: [name], female    DOB: 02/02/1966, 48 y.o.   MRN: [id number]  HPI Here today for her Crohn's. She has a 17 yr hx of Crohn's. Saw Dr. Olena Heckle one week ago with abdominal pain. She was having diarrhea 6-8 stools a day. No blood.  C-diff negative last week. She did have C-diff in September and treated with Flagyl.  She says she has had diarrhea off and on for about a month and a half.  If she takes Imodium the stools will slow down . She was not placed on an antibiotic at OV with Dr. Olena Heckle. She is presently taking 35mg  Prednisone and will reduce to 30 Saturday. Started with 40mg .  Appetite is not good. She says she has lost 16 pounds over the past 3 weeks. (She says she gained to 315) She says her whole abdomen is tender. Rates pain 6/10. She says she has had chills but did not check for fever. Recently diagnosed with Diabetes and started on Metformin  She underwent a colonoscopy in July 2014: Normal mucosa of terminal ileum.  Focal scarring in the region of hepatic flexure and cluster of small polyps felt to be pseudopolyps. Biopsy taken.  Small external hemorrhoids.  Crohn's colitis is in remission.  Biopsy reveals normal mucosa with lymphoid aggregates. No evidence of dysplasia or adenoma. Results reviewed with patient. Decrease 6 MP to 25 mg daily for one month and then stop. No more CBCs OV in 4 months.  Colonoscopy 2008: Active disease in the distal colon and she had a few polyps at transverse colon and all of and all of these non adenomatous.   Review of Systems Past Medical History  Diagnosis Date  . Crohn disease   . High cholesterol   . Diabetes mellitus     diet controlled  . Hypertension     pcp  Dr Coralyn Mark Quillian Quince    [place]  . Asthma   . Anxiety   . Sleep apnea 03-2009 study    pt forgot to mention at pre admit visit.  uses c-pap brought her own  machine from home.   . Arthritis     Past Surgical History  Procedure  Laterality Date  . Tonsillectomy    . Cyst removed from left hand    . Total hip arthroplasty  04/18/2012    Procedure: TOTAL HIP ARTHROPLASTY;  Surgeon: Marybelle Killings, MD;  Location: [place];  Service: Orthopedics;  Laterality: Right;  Right Total Hip Arthroplasty  . Colonoscopy N/A 03/16/2013    Procedure: COLONOSCOPY;  Surgeon: Rogene Houston, MD;  [facility];  Service: Endoscopy;  Laterality: N/A;  255  . Cholecystectomy N/A 03/31/2013    Procedure: LAPAROSCOPIC CHOLECYSTECTOMY;  Surgeon: Jamesetta So, MD;  Location: [facility];  Service: General;  Laterality: N/A;    Allergies  Allergen Reactions  . Remicade [Infliximab] Swelling    Joints locked up  . Cefprozil Rash  . Penicillins Rash    Current Outpatient Prescriptions on File Prior to Visit  Medication Sig Dispense Refill  . albuterol (PROVENTIL,VENTOLIN) 90 MCG/ACT inhaler Inhale 2 puffs into the lungs every 6 (six) hours as needed. Shortness of breath    . ALPRAZolam (XANAX) 0.5 MG tablet Take 0.5 tablets (0.25 mg total) by mouth 2 (two) times daily. For sleep 60 tablet 2  . cetirizine (ZYRTEC) 10 MG tablet Take 10 mg by mouth daily.    . citalopram (CELEXA) 20  MG tablet Take 20 mg by mouth daily.    . Fluticasone-Salmeterol (ADVAIR) 250-50 MCG/DOSE AEPB Inhale 1 puff into the lungs daily.    . medroxyPROGESTERone (DEPO-PROVERA) 150 MG/ML injection Inject 150 mg into the muscle every 3 (three) months.    . mesalamine (PENTASA) 500 MG CR capsule Take 2 capsules (1,000 mg total) by mouth 4 (four) times daily. 240 capsule 3  . Multiple Vitamin (MULITIVITAMIN WITH MINERALS) TABS Take 1 tablet by mouth daily.     No current facility-administered medications on file prior to visit.        Objective:   Physical Exam Filed Vitals:   07/11/14 1558  Height: 5' 3.5" (1.613 m)  Weight: 299 lb 9.6 oz (135.898 kg)   Alert and oriented. Skin warm and dry. Oral mucosa is moist.   . Sclera anicteric, conjunctivae is pink.  Thyroid not enlarged. No cervical lymphadenopathy. Lungs clear. Heart regular rate and rhythm.  Abdomen is soft. Bowel sounds are positive. No hepatomegaly. No abdominal masses felt. Diffuse abdominal tenderness. More tenderness across her abdomen.   No edema to lower extremities.          Assessment & Plan:  Probable  Crohn's flare.  CBC, CMET, and CRP Cipro 500mg  BID x 10 days. Flagyl 500mg  BID x 10 days. Continue Prednisone. Keep OV this month. Imodium BID

## 2014-07-11 NOTE — Patient Instructions (Addendum)
CBC, CMET, CRP. Continue the Prednisone. Cipro and Flagyl. Bland diet. OV 1 month

## 2014-07-12 LAB — COMPREHENSIVE METABOLIC PANEL
ALT: 23 U/L (ref 0–35)
AST: 15 U/L (ref 0–37)
Albumin: 3.9 g/dL (ref 3.5–5.2)
Alkaline Phosphatase: 72 U/L (ref 39–117)
BUN: 13 mg/dL (ref 6–23)
CO2: 25 mEq/L (ref 19–32)
Calcium: 9.5 mg/dL (ref 8.4–10.5)
Chloride: 99 mEq/L (ref 96–112)
Creat: 0.72 mg/dL (ref 0.50–1.10)
Glucose, Bld: 164 mg/dL — ABNORMAL HIGH (ref 70–99)
Potassium: 4.3 mEq/L (ref 3.5–5.3)
Sodium: 136 mEq/L (ref 135–145)
Total Bilirubin: 0.9 mg/dL (ref 0.2–1.2)
Total Protein: 7 g/dL (ref 6.0–8.3)

## 2014-07-12 LAB — C-REACTIVE PROTEIN: CRP: 0.5 mg/dL (ref <0.60)

## 2014-07-16 ENCOUNTER — Encounter (HOSPITAL_COMMUNITY): Payer: Self-pay | Admitting: Emergency Medicine

## 2014-07-16 ENCOUNTER — Emergency Department (HOSPITAL_COMMUNITY)
Admission: EM | Admit: 2014-07-16 | Discharge: 2014-07-16 | Disposition: A | Discharge status: Home / Self Care | Payer: BC Managed Care – PPO | Attending: Emergency Medicine | Admitting: Emergency Medicine

## 2014-07-16 ENCOUNTER — Emergency Department (HOSPITAL_COMMUNITY): Payer: BC Managed Care – PPO

## 2014-07-16 DIAGNOSIS — E669 Obesity, unspecified: Secondary | ICD-10-CM | POA: Diagnosis not present

## 2014-07-16 DIAGNOSIS — J45909 Unspecified asthma, uncomplicated: Secondary | ICD-10-CM | POA: Insufficient documentation

## 2014-07-16 DIAGNOSIS — K5090 Crohn's disease, unspecified, without complications: Secondary | ICD-10-CM | POA: Insufficient documentation

## 2014-07-16 DIAGNOSIS — Z880 Allergy status to penicillin: Secondary | ICD-10-CM | POA: Insufficient documentation

## 2014-07-16 DIAGNOSIS — Z9981 Dependence on supplemental oxygen: Secondary | ICD-10-CM | POA: Diagnosis not present

## 2014-07-16 DIAGNOSIS — E119 Type 2 diabetes mellitus without complications: Secondary | ICD-10-CM | POA: Diagnosis not present

## 2014-07-16 DIAGNOSIS — Z79899 Other long term (current) drug therapy: Secondary | ICD-10-CM | POA: Insufficient documentation

## 2014-07-16 DIAGNOSIS — Z792 Long term (current) use of antibiotics: Secondary | ICD-10-CM | POA: Insufficient documentation

## 2014-07-16 DIAGNOSIS — G4730 Sleep apnea, unspecified: Secondary | ICD-10-CM | POA: Insufficient documentation

## 2014-07-16 DIAGNOSIS — Z7951 Long term (current) use of inhaled steroids: Secondary | ICD-10-CM | POA: Diagnosis not present

## 2014-07-16 DIAGNOSIS — I10 Essential (primary) hypertension: Secondary | ICD-10-CM | POA: Insufficient documentation

## 2014-07-16 DIAGNOSIS — M1990 Unspecified osteoarthritis, unspecified site: Secondary | ICD-10-CM | POA: Insufficient documentation

## 2014-07-16 DIAGNOSIS — F419 Anxiety disorder, unspecified: Secondary | ICD-10-CM | POA: Insufficient documentation

## 2014-07-16 LAB — COMPREHENSIVE METABOLIC PANEL
ALT: 44 U/L — ABNORMAL HIGH (ref 0–35)
AST: 14 U/L (ref 0–37)
Albumin: 3.7 g/dL (ref 3.5–5.2)
Alkaline Phosphatase: 78 U/L (ref 39–117)
Anion gap: 13 (ref 5–15)
BUN: 18 mg/dL (ref 6–23)
CO2: 24 mEq/L (ref 19–32)
Calcium: 10 mg/dL (ref 8.4–10.5)
Chloride: 96 mEq/L (ref 96–112)
Creatinine, Ser: 1.03 mg/dL (ref 0.50–1.10)
GFR calc Af Amer: 74 mL/min — ABNORMAL LOW (ref >90)
GFR calc non Af Amer: 64 mL/min — ABNORMAL LOW (ref >90)
Glucose, Bld: 237 mg/dL — ABNORMAL HIGH (ref 70–99)
Potassium: 4.6 mEq/L (ref 3.7–5.3)
Sodium: 133 mEq/L — ABNORMAL LOW (ref 137–147)
Total Bilirubin: 0.5 mg/dL (ref 0.3–1.2)
Total Protein: 7.6 g/dL (ref 6.0–8.3)

## 2014-07-16 LAB — CBC WITH DIFFERENTIAL/PLATELET
Basophils Absolute: 0.1 10*3/uL (ref 0.0–0.1)
Basophils Relative: 0 % (ref 0–1)
Eosinophils Absolute: 0 10*3/uL (ref 0.0–0.7)
Eosinophils Relative: 0 % (ref 0–5)
HCT: 45.8 % (ref 36.0–46.0)
Hemoglobin: 16.1 g/dL — ABNORMAL HIGH (ref 12.0–15.0)
Lymphocytes Relative: 10 % — ABNORMAL LOW (ref 12–46)
Lymphs Abs: 2.1 10*3/uL (ref 0.7–4.0)
MCH: 33.3 pg (ref 26.0–34.0)
MCHC: 35.2 g/dL (ref 30.0–36.0)
MCV: 94.6 fL (ref 78.0–100.0)
Monocytes Absolute: 1 10*3/uL (ref 0.1–1.0)
Monocytes Relative: 5 % (ref 3–12)
Neutro Abs: 18.2 10*3/uL — ABNORMAL HIGH (ref 1.7–7.7)
Neutrophils Relative %: 85 % — ABNORMAL HIGH (ref 43–77)
Platelets: 293 10*3/uL (ref 150–400)
RBC: 4.84 MIL/uL (ref 3.87–5.11)
RDW: 12.4 % (ref 11.5–15.5)
WBC: 21.4 10*3/uL — ABNORMAL HIGH (ref 4.0–10.5)

## 2014-07-16 LAB — URINALYSIS, ROUTINE W REFLEX MICROSCOPIC
Bilirubin Urine: NEGATIVE
Glucose, UA: 500 mg/dL — AB
Hgb urine dipstick: NEGATIVE
Ketones, ur: NEGATIVE mg/dL
Leukocytes, UA: NEGATIVE
Nitrite: NEGATIVE
Protein, ur: NEGATIVE mg/dL
Specific Gravity, Urine: 1.02 (ref 1.005–1.030)
Urobilinogen, UA: 0.2 mg/dL (ref 0.0–1.0)
pH: 6.5 (ref 5.0–8.0)

## 2014-07-16 LAB — LIPASE, BLOOD: Lipase: 37 U/L (ref 11–59)

## 2014-07-16 MED ORDER — SODIUM CHLORIDE 0.9 % IV BOLUS (SEPSIS)
1000.0000 mL | Freq: Once | INTRAVENOUS | Status: AC
  Administered 2014-07-16: 1000 mL via INTRAVENOUS

## 2014-07-16 NOTE — ED Provider Notes (Signed)
CSN: [id number]     Arrival date & time 07/16/14  1528 History  This chart was scribed for [employer]. Alvino Chapel, MD by Tula Nakayama, ED Scribe. This patient was seen in room [facility] and the patient's care was started at 4:49 PM.    Chief Complaint  Patient presents with  . Crohn's Disease   The history is provided by the patient. No language interpreter was used.   HPI Comments: [name] is a 48 y.o. female with a history of Crohn's disease and cholecystectomy who presents to the Emergency Department complaining of intermittent nausea, diarrhea and abdominal pain that started 2 months ago and became worse 2 weeks ago. She notes weakness, diaphoresis, fever and chills as associated symptoms. Pt has a history of similar symptoms associated with Crohn's disease flare-ups. She has tried Prednisone, Cipro and Flagyl prescribed by PCP and Dr. Vaughan Basta with no relief to symptoms. She also takes Pentasa daily. Pt was diagnosed with C. Diff 2 months ago, but notes recent stool sample tested negative. She thinks a Crohn's flare-up occurred with infection. Pt denies sick contact and recent travel. She denies blood in stool as an associated symptom.   Past Medical History  Diagnosis Date  . Crohn disease   . High cholesterol   . Diabetes mellitus     diet controlled  . Hypertension     pcp  Dr Coralyn Mark Quillian Quince    [place]  . Asthma   . Anxiety   . Sleep apnea 03-2009 study    pt forgot to mention at pre admit visit.  uses c-pap brought her own  machine from home.   . Arthritis    Past Surgical History  Procedure Laterality Date  . Tonsillectomy    . Cyst removed from left hand    . Total hip arthroplasty  04/18/2012    Procedure: TOTAL HIP ARTHROPLASTY;  Surgeon: Marybelle Killings, MD;  Location: [place];  Service: Orthopedics;  Laterality: Right;  Right Total Hip Arthroplasty  . Colonoscopy N/A 03/16/2013    Procedure: COLONOSCOPY;  Surgeon: Rogene Houston, MD;  [facility];  Service:  Endoscopy;  Laterality: N/A;  255  . Cholecystectomy N/A 03/31/2013    Procedure: LAPAROSCOPIC CHOLECYSTECTOMY;  Surgeon: Jamesetta So, MD;  Location: [facility];  Service: General;  Laterality: N/A;   No family history on file. History  Substance Use Topics  . Smoking status: Never Smoker   . Smokeless tobacco: Not on file  . Alcohol Use: No   OB History    No data available     Review of Systems  Constitutional: Positive for fever, chills and diaphoresis.  Gastrointestinal: Positive for nausea, vomiting, abdominal pain and diarrhea. Negative for blood in stool.  Neurological: Positive for weakness.  All other systems reviewed and are negative.     Allergies  Remicade; Cefprozil; and Penicillins  Home Medications   Prior to Admission medications   Medication Sig Start Date End Date Taking? Authorizing Provider  albuterol (PROVENTIL,VENTOLIN) 90 MCG/ACT inhaler Inhale 2 puffs into the lungs every 6 (six) hours as needed. Shortness of breath    Historical Provider, MD  ALPRAZolam (XANAX) 0.5 MG tablet Take 0.5 tablets (0.25 mg total) by mouth 2 (two) times daily. For sleep 02/09/14   Rogene Houston, MD  cetirizine (ZYRTEC) 10 MG tablet Take 10 mg by mouth daily.    Historical Provider, MD  ciprofloxacin (CIPRO) 500 MG tablet Take 1 tablet (500 mg total) by  mouth 2 (two) times daily. 07/11/14   Butch Penny, NP  citalopram (CELEXA) 20 MG tablet Take 20 mg by mouth daily.    Historical Provider, MD  diltiazem (TIAZAC) 360 MG 24 hr capsule Take 360 mg by mouth daily.    Historical Provider, MD  Fluticasone-Salmeterol (ADVAIR) 250-50 MCG/DOSE AEPB Inhale 1 puff into the lungs daily.    Historical Provider, MD  lisinopril-hydrochlorothiazide (PRINZIDE,ZESTORETIC) 20-12.5 MG per tablet Take 1 tablet by mouth daily.    Historical Provider, MD  medroxyPROGESTERone (DEPO-PROVERA) 150 MG/ML injection Inject 150 mg into the muscle every 3 (three) months.    Historical Provider, MD   mesalamine (PENTASA) 500 MG CR capsule Take 2 capsules (1,000 mg total) by mouth 4 (four) times daily. 12/04/13   Butch Penny, NP  metFORMIN (GLUCOPHAGE) 500 MG tablet Take by mouth 2 (two) times daily with a meal.    Historical Provider, MD  metroNIDAZOLE (FLAGYL) 500 MG tablet Take 1 tablet (500 mg total) by mouth 2 (two) times daily. 07/11/14   Butch Penny, NP  Multiple Vitamin (MULITIVITAMIN WITH MINERALS) TABS Take 1 tablet by mouth daily.    Historical Provider, MD  spironolactone (ALDACTONE) 25 MG tablet Take 25 mg by mouth daily.    Historical Provider, MD   BP 143/61 mmHg  Pulse 91  Temp(Src) 97.9 F (36.6 C) (Oral)  Resp 18  Ht 5' 3.5" (1.613 m)  Wt 295 lb (133.811 kg)  BMI 51.43 kg/m2  SpO2 96% Physical Exam  Constitutional: She appears well-developed and well-nourished. No distress.  Obese. Afebrile.   HENT:  Head: Normocephalic and atraumatic.  Eyes: Conjunctivae and EOM are normal.  Neck: Neck supple. No tracheal deviation present.  Cardiovascular: Normal rate.   Pulmonary/Chest: Effort normal. No respiratory distress.  Abdominal: Soft. There is tenderness.  Mild diffuse tenderness. Hyperactive bowel sounds.   Musculoskeletal: She exhibits edema.  Mild bilateral pitting edema.  Skin: Skin is warm and dry.  Psychiatric: She has a normal mood and affect. Her behavior is normal.  Nursing note and vitals reviewed.   ED Course  Procedures (including critical care time)  COORDINATION OF CARE: 5:04 PM Discussed treatment plan with pt at bedside and pt agreed to plan.  Results for orders placed or performed in visit on 07/11/14  CBC with Differential  Result Value Ref Range   WBC 15.5 (H) 4.0 - 10.5 K/uL   RBC 4.79 3.87 - 5.11 MIL/uL   Hemoglobin 15.2 (H) 12.0 - 15.0 g/dL   HCT 46.0 36.0 - 46.0 %   MCV 96.0 78.0 - 100.0 fL   MCH 31.7 26.0 - 34.0 pg   MCHC 33.0 30.0 - 36.0 g/dL   RDW 13.0 11.5 - 15.5 %   Platelets 307 150 - 400 K/uL   Neutrophils Relative  % 87 (H) 43 - 77 %   Neutro Abs 13.5 (H) 1.7 - 7.7 K/uL   Lymphocytes Relative 10 (L) 12 - 46 %   Lymphs Abs 1.6 0.7 - 4.0 K/uL   Monocytes Relative 3 3 - 12 %   Monocytes Absolute 0.5 0.1 - 1.0 K/uL   Eosinophils Relative 0 0 - 5 %   Eosinophils Absolute 0.0 0.0 - 0.7 K/uL   Basophils Relative 0 0 - 1 %   Basophils Absolute 0.0 0.0 - 0.1 K/uL   Smear Review Criteria for review not met   Comp Met (CMET)  Result Value Ref Range   Sodium 136 135 - 145  mEq/L   Potassium 4.3 3.5 - 5.3 mEq/L   Chloride 99 96 - 112 mEq/L   CO2 25 19 - 32 mEq/L   Glucose, Bld 164 (H) 70 - 99 mg/dL   BUN 13 6 - 23 mg/dL   Creat 0.72 0.50 - 1.10 mg/dL   Total Bilirubin 0.9 0.2 - 1.2 mg/dL   Alkaline Phosphatase 72 39 - 117 U/L   AST 15 0 - 37 U/L   ALT 23 0 - 35 U/L   Total Protein 7.0 6.0 - 8.3 g/dL   Albumin 3.9 3.5 - 5.2 g/dL   Calcium 9.5 8.4 - 10.5 mg/dL  C-reactive protein  Result Value Ref Range   CRP 0.5 <0.60 mg/dL   No results found.  Labs Review Labs Reviewed - No data to display  Imaging Review No results found.   EKG Interpretation None      MDM   Final diagnoses:  None    Patient with history of C. Difficile and Crohn's disease presents with diarrhea and some abdominal pain. Has been seen by GI for the same and is currently on Cipro Flagyl and steroids. Patient states she has not been getting better. White count is elevated, however she is on steroids. Abdominal exam is rather benign. Doubt severe abscess. Recurrence of C. Difficile is possible, however she cannot give a stool sample at this time. Patient feels better after a liter of IV fluid. She does not appear overly dehydrated. Will discharge home to follow-up with GI. Patient will return for fevers or increasing pain. patient does not want antibiotics or pain medicines.   I personally performed the services described in this documentation, which was scribed in my presence. The recorded information has been reviewed and  is accurate.     Jasper Riling. Alvino Chapel, MD 07/16/14 2039

## 2014-07-16 NOTE — ED Notes (Signed)
Pt states Crohns is flaring, nausea, diarrhea, diaphoretic, abdominal pain. Pt was put on prednisone, cipro,  And flagyl last week without results.

## 2014-07-16 NOTE — Discharge Instructions (Signed)
Crohn Disease Crohn disease is a long-term (chronic) soreness and redness (inflammation) of the intestines (bowel). It can affect any portion of the digestive tract, from the mouth to the anus. It can also cause problems outside the digestive tract. Crohn disease is closely related to a disease called ulcerative colitis (together, these two diseases are called inflammatory bowel disease).  CAUSES  The cause of Crohn disease is not known. One Link Snuffer is that, in an easily affected person, the immune system is triggered to attack the body's own digestive tissue. Crohn disease runs in families. It seems to be more common in certain geographic areas and amongst certain races. There are no clear-cut dietary causes.  SYMPTOMS  Crohn disease can cause many different symptoms since it can affect many different parts of the body. Symptoms include:  Fatigue.  Weight loss.  Chronic diarrhea, sometime bloody.  Abdominal pain and cramps.  Fever.  Ulcers or canker sores in the mouth or rectum.  Anemia (low red blood cells).  Arthritis, skin problems, and eye problems may occur. Complications of Crohn disease can include:  Series of holes (perforation) of the bowel.  Portions of the intestines sticking to each other (adhesions).  Obstruction of the bowel.  Fistula formation, typically in the rectal area but also in other areas. A fistula is an opening between the bowels and the outside, or between the bowels and another organ.  A painful crack in the mucous membrane of the anus (rectal fissure). DIAGNOSIS  Your caregiver may suspect Crohn disease based on your symptoms and an exam. Blood tests may confirm that there is a problem. You may be asked to submit a stool specimen for examination. X-rays and CT scans may be necessary. Ultimately, the diagnosis is usually made after a procedure that uses a flexible tube that is inserted via your mouth or your anus. This is done under sedation and is called  either an upper endoscopy or colonoscopy. With these tests, the specialist can take tiny tissue samples and remove them from the inside of the bowel (biopsy). Examination of this biopsy tissue under a microscope can reveal Crohn disease as the cause of your symptoms. Due to the many different forms that Crohn disease can take, symptoms may be present for several years before a diagnosis is made. TREATMENT  Medications are often used to decrease inflammation and control the immune system. These include medicines related to aspirin, steroid medications, and newer and stronger medications to slow down the immune system. Some medications may be used as suppositories or enemas. A number of other medications are used or have been studied. Your caregiver will make specific recommendations. HOME CARE INSTRUCTIONS   Symptoms such as diarrhea can be controlled with medications. Avoid foods that have a laxative effect such as fresh fruit, vegetables, and dairy products. During flare-ups, you can rest your bowel by refraining from solid foods. Drink clear liquids frequently during the day. (Electrolyte or rehydrating fluids are best. Your caregiver can help you with suggestions.) Drink often to prevent loss of body fluids (dehydration). When diarrhea has cleared, eat small meals and more frequently. Avoid food additives and stimulants such as caffeine (coffee, tea, or chocolate). Enzyme supplements may help if you develop intolerance to a sugar in dairy products (lactose). Ask your caregiver or dietitian about specific dietary instructions.  Try to maintain a positive attitude. Learn relaxation techniques such as self-hypnosis, mental imaging, and muscle relaxation.  If possible, avoid stresses which can aggravate your condition.  Exercise  regularly.  Follow your diet.  Always get plenty of rest. SEEK MEDICAL CARE IF:   Your symptoms fail to improve after a week or two of new treatment.  You experience  continued weight loss.  You have ongoing cramps or loose bowels.  You develop a new skin rash, skin sores, or eye problems. SEEK IMMEDIATE MEDICAL CARE IF:   You have worsening of your symptoms or develop new symptoms.  You have a fever.  You develop bloody diarrhea.  You develop severe abdominal pain. MAKE SURE YOU:   Understand these instructions.  Will watch your condition.  Will get help right away if you are not doing well or get worse. Document Released: 05/27/2005 Document Revised: 01/01/2014 Document Reviewed: 04/25/2007 [facility] Patient Information 2015 [place], [place]. This information is not intended to replace advice given to you by your health care provider. Make sure you discuss any questions you have with your health care provider.

## 2014-07-30 ENCOUNTER — Ambulatory Visit (INDEPENDENT_AMBULATORY_CARE_PROVIDER_SITE_OTHER): Payer: BC Managed Care – PPO | Admitting: Internal Medicine

## 2014-07-30 ENCOUNTER — Other Ambulatory Visit (INDEPENDENT_AMBULATORY_CARE_PROVIDER_SITE_OTHER): Payer: Self-pay | Admitting: *Deleted

## 2014-07-30 ENCOUNTER — Encounter (INDEPENDENT_AMBULATORY_CARE_PROVIDER_SITE_OTHER): Payer: Self-pay | Admitting: *Deleted

## 2014-07-30 ENCOUNTER — Encounter (INDEPENDENT_AMBULATORY_CARE_PROVIDER_SITE_OTHER): Payer: Self-pay | Admitting: Internal Medicine

## 2014-07-30 VITALS — BP 116/72 | HR 74 | Temp 98.5°F | Resp 18 | Ht 63.5 in | Wt 301.2 lb

## 2014-07-30 DIAGNOSIS — K50118 Crohn's disease of large intestine with other complication: Secondary | ICD-10-CM

## 2014-07-30 DIAGNOSIS — K50119 Crohn's disease of large intestine with unspecified complications: Secondary | ICD-10-CM

## 2014-07-30 NOTE — Patient Instructions (Addendum)
Flexible sigmoidoscopy with biopsy to be scheduled. Drop prednisone down to 10 mg per day per schedule and stay on it until further notice

## 2014-07-30 NOTE — Progress Notes (Signed)
Presenting complaint;  Follow-up for Crohn's colitis.  Database;  Patient is 48 year old Caucasian female who has history of Crohn's colitis which was diagnosed in July 2001. She was maintained on 6-MP and Pentasa and remained in remission for years. She wanted to calm off 6-MP because of potential side effects. She had colonoscopy in July 2014 and she was in remission. Therefore she came off 6-MP and did fine until 3 months ago when she developed diarrhea and was diagnosed with C. difficile and was treated with Flagyl for 2 weeks. Her diarrhea improved but never resolved completely and she use Imodium on an as-needed basis. Symptoms got worse 6 weeks ago when she developed upper abdominal pain as well as pain along left side of her abdomen diarrhea cramping and nausea. Repeat C. difficile was negative and she was begun on prednisone 40 mg daily. She started to feel better. She been tapering her prednisone. She was seen her office on 07/11/2014. Her abdominal pain got worse and she was evaluated in the emergency room on 07/16/2014 an acute abdominal series was unremarkable. She had leukocytosis which was felt to be due to prednisone. She was treated in discharge and advised to follow-up in her office. Returns for follow-up visit.  Subjective;  She does not feel well. She continues to complain of pain across her upper abdomen which has decreased since her last visit but experienced daily. She has nausea without vomiting. This pain is worse after each meal. She is still having at least 4 stools per day. Stools are loose or semi-formed. She also has painful defecation. She feels tired and weak. She feels under a lot of stress relating her work and also because her father has Parkinson's disease and not doing well. She denies fever chills or night sweats. She also denies hematuria dysuria or melena. She also has brought disability papers that she would like for [place] to fill. Her weight has been stable since  her last visit but she states she has lost 7 or 8 pounds in the last few months.  Current Medications: Outpatient Encounter Prescriptions as of 07/30/2014  Medication Sig  . albuterol (PROVENTIL,VENTOLIN) 90 MCG/ACT inhaler Inhale 2 puffs into the lungs every 6 (six) hours as needed. Shortness of breath  . ALPRAZolam (XANAX) 0.5 MG tablet Take 0.5 tablets (0.25 mg total) by mouth 2 (two) times daily. For sleep  . cetirizine (ZYRTEC) 10 MG tablet Take 10 mg by mouth every morning.   . citalopram (CELEXA) 20 MG tablet Take 20 mg by mouth every morning.   . diltiazem (TIAZAC) 360 MG 24 hr capsule Take 360 mg by mouth every morning.   . Fluticasone-Salmeterol (ADVAIR) 250-50 MCG/DOSE AEPB Inhale 1 puff into the lungs every morning.   Marland Kitchen lisinopril-hydrochlorothiazide (PRINZIDE,ZESTORETIC) 20-12.5 MG per tablet Take 2 tablets by mouth every morning.   . medroxyPROGESTERone (DEPO-PROVERA) 150 MG/ML injection Inject 150 mg into the muscle every 3 (three) months.  . mesalamine (PENTASA) 500 MG CR capsule Take 2 capsules (1,000 mg total) by mouth 4 (four) times daily.  . metFORMIN (GLUCOPHAGE-XR) 500 MG 24 hr tablet Take 500 mg by mouth every evening.  . Multiple Vitamin (MULITIVITAMIN WITH MINERALS) TABS Take 1 tablet by mouth every morning.   . predniSONE (DELTASONE) 5 MG tablet Take 15 mg by mouth. Patient is currently taking 15 mg by mouth daily.  Marland Kitchen spironolactone (ALDACTONE) 25 MG tablet Take 25 mg by mouth every morning.   . [DISCONTINUED] ciprofloxacin (CIPRO) 500 MG tablet Take  1 tablet (500 mg total) by mouth 2 (two) times daily. (Patient not taking: Reported on 07/30/2014)  . [DISCONTINUED] metroNIDAZOLE (FLAGYL) 500 MG tablet Take 1 tablet (500 mg total) by mouth 2 (two) times daily. (Patient not taking: Reported on 07/30/2014)     Objective: Blood pressure 116/72, pulse 74, temperature 98.5 F (36.9 C), temperature source Oral, resp. rate 18, height 5' 3.5" (1.613 m), weight 301 lb 3.2 oz  (136.623 kg). Patient is alert and in no acute distress.. Conjunctiva is pink. Sclera is nonicteric Oropharyngeal mucosa is normal. No neck masses or thyromegaly noted. Cardiac exam with regular rhythm normal S1 and S2. No murmur or gallop noted. Lungs are clear to auscultation. Abdomen is obese. Bowel sounds are normal. Abdomen is soft with mild-to-moderate tenderness in left lower, left upper quadrant as well as epigastric region. No organomegaly or masses noted.  No LE edema or clubbing noted.  Labs/studies Results: Lab data from 07/16/2014  WBC 21.4, H&H 16.1 and 45.8 and platelet count 293K  Differential revealed 85% neutrophils.   comprehensive chemistry panel pertinent for serum sodium 133, glucose 237 and ALT of 44. AST was normal at 14.  Assessment:  #1. Patient's symptom complex most consistent with relapse of Crohn's colitis. She remained in remission on 6-MP for number of years. Therapy was discontinued last year. Before this therapy is reinstituted need to rule out superadded CMV colitis. #2. Mildly elevated ALT most likely secondary to fatty liver.   Plan:  Continue prednisone taper until down to 10 mg daily and stay on it. Flexible sigmoidoscopy with biopsy with sedation. Will begin 6-MP therapy after above completed. Will fill papers for disability. Office visit in 3 months.Marland Kitchen

## 2014-08-13 ENCOUNTER — Telehealth (INDEPENDENT_AMBULATORY_CARE_PROVIDER_SITE_OTHER): Payer: Self-pay | Admitting: *Deleted

## 2014-08-13 NOTE — Telephone Encounter (Signed)
Patient called and states that she has completed all the Prednisone that she had. Per Dr.[name] the patient was to titrate down to 10 mg daily and stay there. A prescription was called into [facility] for Prednisone 10 mg -Take 1 by mouth daily #100 1 refill. Patient was made aware.

## 2014-08-21 ENCOUNTER — Other Ambulatory Visit (INDEPENDENT_AMBULATORY_CARE_PROVIDER_SITE_OTHER): Payer: Self-pay | Admitting: Internal Medicine

## 2014-08-21 ENCOUNTER — Encounter (HOSPITAL_COMMUNITY): Payer: Self-pay | Admitting: *Deleted

## 2014-08-21 ENCOUNTER — Ambulatory Visit (HOSPITAL_COMMUNITY)
Admission: RE | Admit: 2014-08-21 | Discharge: 2014-08-21 | Disposition: A | Discharge status: Home / Self Care | Payer: BC Managed Care – PPO | Source: Ambulatory Visit | Attending: Internal Medicine | Admitting: Internal Medicine

## 2014-08-21 ENCOUNTER — Encounter (HOSPITAL_COMMUNITY)
Admission: RE | Disposition: A | Discharge status: Home / Self Care | Payer: Self-pay | Source: Ambulatory Visit | Attending: Internal Medicine

## 2014-08-21 DIAGNOSIS — E780 Pure hypercholesterolemia: Secondary | ICD-10-CM | POA: Insufficient documentation

## 2014-08-21 DIAGNOSIS — K5010 Crohn's disease of large intestine without complications: Secondary | ICD-10-CM | POA: Insufficient documentation

## 2014-08-21 DIAGNOSIS — R109 Unspecified abdominal pain: Secondary | ICD-10-CM

## 2014-08-21 DIAGNOSIS — R197 Diarrhea, unspecified: Secondary | ICD-10-CM | POA: Diagnosis present

## 2014-08-21 DIAGNOSIS — K50119 Crohn's disease of large intestine with unspecified complications: Secondary | ICD-10-CM

## 2014-08-21 DIAGNOSIS — I10 Essential (primary) hypertension: Secondary | ICD-10-CM | POA: Insufficient documentation

## 2014-08-21 DIAGNOSIS — F419 Anxiety disorder, unspecified: Secondary | ICD-10-CM | POA: Diagnosis not present

## 2014-08-21 DIAGNOSIS — J45909 Unspecified asthma, uncomplicated: Secondary | ICD-10-CM | POA: Diagnosis not present

## 2014-08-21 DIAGNOSIS — E119 Type 2 diabetes mellitus without complications: Secondary | ICD-10-CM | POA: Insufficient documentation

## 2014-08-21 DIAGNOSIS — M1990 Unspecified osteoarthritis, unspecified site: Secondary | ICD-10-CM | POA: Diagnosis not present

## 2014-08-21 DIAGNOSIS — K635 Polyp of colon: Secondary | ICD-10-CM | POA: Insufficient documentation

## 2014-08-21 DIAGNOSIS — D123 Benign neoplasm of transverse colon: Secondary | ICD-10-CM

## 2014-08-21 HISTORY — PX: FLEXIBLE SIGMOIDOSCOPY: SHX5431

## 2014-08-21 LAB — GLUCOSE, CAPILLARY: Glucose-Capillary: 126 mg/dL — ABNORMAL HIGH (ref 70–99)

## 2014-08-21 SURGERY — SIGMOIDOSCOPY, FLEXIBLE
Anesthesia: Moderate Sedation

## 2014-08-21 MED ORDER — SODIUM CHLORIDE 0.9 % IV SOLN
INTRAVENOUS | Status: DC
  Administered 2014-08-21: 10:00:00 via INTRAVENOUS

## 2014-08-21 MED ORDER — DICYCLOMINE HCL 10 MG PO CAPS: 10.0000 mg | ORAL_CAPSULE | Freq: Three times a day (TID) | ORAL | Status: DC

## 2014-08-21 MED ORDER — MIDAZOLAM HCL 5 MG/5ML IJ SOLN
INTRAMUSCULAR | Status: DC | PRN
  Administered 2014-08-21 (×4): 2 mg via INTRAVENOUS

## 2014-08-21 MED ORDER — STERILE WATER FOR IRRIGATION IR SOLN
Status: DC | PRN
  Administered 2014-08-21: 10:00:00

## 2014-08-21 MED ORDER — MEPERIDINE HCL 25 MG/ML IJ SOLN
INTRAMUSCULAR | Status: DC | PRN
  Administered 2014-08-21 (×2): 25 mg via INTRAVENOUS

## 2014-08-21 MED ORDER — MIDAZOLAM HCL 5 MG/5ML IJ SOLN
INTRAMUSCULAR | Status: AC
  Filled 2014-08-21: qty 10

## 2014-08-21 MED ORDER — MEPERIDINE HCL 50 MG/ML IJ SOLN
INTRAMUSCULAR | Status: AC
  Filled 2014-08-21: qty 1

## 2014-08-21 NOTE — Discharge Instructions (Signed)
Drop prednisone dose to 5 mg daily for 1 week and then stop. Resume other medications and diet as before. Dicyclomine 10 mg by mouth 15-30 minutes before each meal. No driving for 24 hours. Physician will call with biopsy results. Abdominopelvic CT with contrast to be scheduled. Office visit in 8 weeks.   Flexible Sigmoidoscopy, Care After Refer to this sheet in the next few weeks. These instructions provide you with information on caring for yourself after your procedure. Your health care provider may also give you more specific instructions. Your treatment has been planned according to current medical practices, but problems sometimes occur. Call your health care provider if you have any problems or questions after your procedure. WHAT TO EXPECT AFTER THE PROCEDURE After your procedure, it is typical to have the following:   Abdominal cramps.  Bloating.  A small amount of rectal bleeding if you had a biopsy. HOME CARE INSTRUCTIONS  Only take over-the-counter or prescription medicines for pain, fever, or discomfort as directed by your health care provider.  Resume your normal diet and activities as directed by your health care provider. SEEK MEDICAL CARE IF:  You have abdominal pain or cramping that lasts longer than 1 hour after the procedure.  You continue to have small amounts of rectal bleeding after 24 hours.  You have nausea or vomiting.  You feel weak or dizzy. SEEK IMMEDIATE MEDICAL CARE IF:   You have a fever.  You pass large blood clots or see a large amount of blood in the toilet after having a bowel movement. This may also occur 10-14 days after the procedure. It is more likely if you had a biopsy.  You develop abdominal pain that is not relieved with medicine or your abdominal pain gets worse.  You have nausea or vomiting for more than 24 hours after the procedure. Document Released: 08/22/2013 Document Reviewed: 08/22/2013 [facility] Patient Information 2015  [place], [place]. This information is not intended to replace advice given to you by your health care provider. Make sure you discuss any questions you have with your health care provider.

## 2014-08-21 NOTE — H&P (Signed)
[name] is an 48 y.o. female.   Chief Complaint: Patient is here for flexible sigmoidoscopy. HPI: Patient is 48 year old Caucasian female was history of Crohn's colitis who decided to go off 6-MP last year. She was documented to be in remission based on colonoscopy of July 2014. Now she has developed abdominal pain diarrhea and felt to have a relapse. She is doing better with prednisone. She is undergoing diagnostic flexible sigmoidoscopy to make sure she does not see any colitis before she is placed back on 6-MP.  Past Medical History  Diagnosis Date  . Crohn disease   . High cholesterol   . Diabetes mellitus     diet controlled  . Hypertension     pcp  Dr Coralyn Mark Quillian Quince    [facility]  . Asthma   . Anxiety   . Sleep apnea 03-2009 study    pt forgot to mention at pre admit visit.  uses c-pap brought her own  machine from home.   . Arthritis     Past Surgical History  Procedure Laterality Date  . Tonsillectomy    . Cyst removed from left hand    . Total hip arthroplasty  04/18/2012    Procedure: TOTAL HIP ARTHROPLASTY;  Surgeon: Marybelle Killings, MD;  Location: [facility];  Service: Orthopedics;  Laterality: Right;  Right Total Hip Arthroplasty  . Colonoscopy N/A 03/16/2013    Procedure: COLONOSCOPY;  Surgeon: Rogene Houston, MD;  Location: [facility];  Service: Endoscopy;  Laterality: N/A;  255  . Cholecystectomy N/A 03/31/2013    Procedure: LAPAROSCOPIC CHOLECYSTECTOMY;  Surgeon: Jamesetta So, MD;  Location: [facility];  Service: General;  Laterality: N/A;    History reviewed. No pertinent family history. Social History:  reports that she has never smoked. She has never used smokeless tobacco. She reports that she does not drink alcohol or use illicit drugs.  Allergies:  Allergies  Allergen Reactions  . Remicade [Infliximab] Swelling    Joints locked up  . Cefprozil Rash  . Penicillins Rash    Medications Prior to Admission  Medication Sig Dispense Refill  . albuterol  (PROVENTIL,VENTOLIN) 90 MCG/ACT inhaler Inhale 2 puffs into the lungs every 6 (six) hours as needed. Shortness of breath    . ALPRAZolam (XANAX) 0.5 MG tablet Take 0.5 tablets (0.25 mg total) by mouth 2 (two) times daily. For sleep 60 tablet 2  . cetirizine (ZYRTEC) 10 MG tablet Take 10 mg by mouth every morning.     . citalopram (CELEXA) 20 MG tablet Take 20 mg by mouth every morning.     . diltiazem (TIAZAC) 360 MG 24 hr capsule Take 360 mg by mouth every morning.     . Fluticasone-Salmeterol (ADVAIR) 250-50 MCG/DOSE AEPB Inhale 1 puff into the lungs every morning.     Marland Kitchen lisinopril-hydrochlorothiazide (PRINZIDE,ZESTORETIC) 20-12.5 MG per tablet Take 2 tablets by mouth every morning.     . medroxyPROGESTERone (DEPO-PROVERA) 150 MG/ML injection Inject 150 mg into the muscle every 3 (three) months.    . mesalamine (PENTASA) 500 MG CR capsule Take 2 capsules (1,000 mg total) by mouth 4 (four) times daily. 240 capsule 3  . metFORMIN (GLUCOPHAGE-XR) 500 MG 24 hr tablet Take 500 mg by mouth every evening.    . Multiple Vitamin (MULITIVITAMIN WITH MINERALS) TABS Take 1 tablet by mouth every morning.     . predniSONE (DELTASONE) 5 MG tablet Take 10 mg by mouth daily with breakfast.     . spironolactone (  ALDACTONE) 25 MG tablet Take 25 mg by mouth every morning.       Results for orders placed or performed during the hospital encounter of 08/21/14 (from the past 48 hour(s))  Glucose, capillary     Status: Abnormal   Collection Time: 08/21/14  9:48 AM  Result Value Ref Range   Glucose-Capillary 126 (H) 70 - 99 mg/dL   No results found.  ROS  Blood pressure 149/69, pulse 97, temperature 98.1 F (36.7 C), temperature source Oral, resp. rate 19, height 5' 3.5" (1.613 m), weight 295 lb (133.811 kg), SpO2 99 %. Physical Exam  Constitutional:  Well-developed obese Caucasian female in NAD.  HENT:  Mouth/Throat: Oropharynx is clear and moist.  Eyes: Conjunctivae are normal. No scleral icterus.  Neck:  No thyromegaly present.  Cardiovascular: Normal rate, regular rhythm and normal heart sounds.   No murmur heard. Respiratory: Effort normal and breath sounds normal.  GI:  Protuberant abdomen. It is soft with mild tenderness at LUQ. No organomegaly or masses.  Musculoskeletal: She exhibits edema (trace edema around ankles).  Lymphadenopathy:    She has no cervical adenopathy.  Neurological: She is alert.  Skin: Skin is warm and dry.     Assessment/Plan Diarrhea and abdominal pain. History of Crohn's colitis. Diagnostic flexible sigmoidoscopy.  Mcintyre,Cheryl U 08/21/2014, 10:10 AM

## 2014-08-21 NOTE — Op Note (Signed)
FLEXIBLE SIGMOIDOSCOPY  PROCEDURE REPORT  PATIENT:  [name]  MR#:  [id number] Birthdate:  Jan 17, 1966, 48 y.o., female Endoscopist:  Dr. Rogene Houston, MD Referred By:  Dr. Gar Ponto, MD  Procedure Date: 08/21/2014  Procedure:   Flexible sigmoidoscopy.  Indications: Patient is 48 year old Caucasian who has history of Crohn disease. 6-MP was stopped last year(patient wanted to calm off therapy). She had colonoscopy in July last year and documented to be in remission.  Informed Consent:  The procedure and risks were reviewed with the patient and informed consent was obtained.  Medications:  Demerol 50 mg IV Versed 8 mg IV  Description of procedure:  After a digital rectal exam was performed, that colonoscope was advanced from the anus through the rectum and colon to the area of hepatic flexure. Scope was gradually withdrawn; mucosal surfaces were carefully surveyed utilizing scope tip to flexion to facilitate fold flattening as needed. The scope was pulled down into the rectum where a thorough exam including retroflexion was performed.  Findings:   Prep satisfactory. Prep satisfactory to level of splenic flexure. Few small polyps noted and mid transverse colon felt to be pseudopolyps or inflammatory polyps. 2 were ablated via cold biopsy. Normal mucosa of descending and sigmoid colon. Normal rectal mucosa. Remarkable in the rectal junction.   Therapeutic/Diagnostic Maneuvers Performed:  See above  Complications:  None    Impression:  Examination performed to hepatic flexure. Cluster of small polyps at distal transverse colon consistent with pseudopolyps or hyperplastic polyps. 2 were biopsied for histology.. No evidence of active colitis.  Comment; Condition may have improved with therapy and she may also have IBS along with IBD.  Recommendations:  Standard instructions given. Decrease prednisone dose to 5 mg daily and stop after 1 week. Dicyclomine 10 mg  by mouth before each meal daily. Abdominopelvic CT with contrast to further evaluate left-sided abdominal pain. Office visit in 8 weeks.  [name],[name]  08/21/2014 10:36 AM  CC: Dr. Gar Ponto, MD & Dr. Rayne Du ref. provider found

## 2014-08-22 ENCOUNTER — Encounter (HOSPITAL_COMMUNITY): Payer: Self-pay | Admitting: Internal Medicine

## 2014-08-29 ENCOUNTER — Ambulatory Visit (HOSPITAL_COMMUNITY)
Admit: 2014-08-29 | Discharge: 2014-08-29 | Disposition: A | Discharge status: Home / Self Care | Payer: BC Managed Care – PPO | Source: Ambulatory Visit | Attending: Internal Medicine | Admitting: Internal Medicine

## 2014-08-29 DIAGNOSIS — Z96641 Presence of right artificial hip joint: Secondary | ICD-10-CM | POA: Insufficient documentation

## 2014-08-29 DIAGNOSIS — R197 Diarrhea, unspecified: Secondary | ICD-10-CM

## 2014-08-29 DIAGNOSIS — K429 Umbilical hernia without obstruction or gangrene: Secondary | ICD-10-CM | POA: Diagnosis not present

## 2014-08-29 DIAGNOSIS — Z9049 Acquired absence of other specified parts of digestive tract: Secondary | ICD-10-CM | POA: Diagnosis not present

## 2014-08-29 DIAGNOSIS — R634 Abnormal weight loss: Secondary | ICD-10-CM | POA: Insufficient documentation

## 2014-08-29 DIAGNOSIS — K5090 Crohn's disease, unspecified, without complications: Secondary | ICD-10-CM | POA: Diagnosis not present

## 2014-08-29 DIAGNOSIS — K50119 Crohn's disease of large intestine with unspecified complications: Secondary | ICD-10-CM

## 2014-08-29 DIAGNOSIS — I7090 Unspecified atherosclerosis: Secondary | ICD-10-CM | POA: Diagnosis not present

## 2014-08-29 DIAGNOSIS — K529 Noninfective gastroenteritis and colitis, unspecified: Secondary | ICD-10-CM | POA: Insufficient documentation

## 2014-08-29 DIAGNOSIS — R109 Unspecified abdominal pain: Secondary | ICD-10-CM

## 2014-08-29 MED ORDER — IOHEXOL 300 MG/ML  SOLN
100.0000 mL | Freq: Once | INTRAMUSCULAR | Status: AC | PRN
  Administered 2014-08-29: 100 mL via INTRAVENOUS

## 2014-09-04 ENCOUNTER — Telehealth (INDEPENDENT_AMBULATORY_CARE_PROVIDER_SITE_OTHER): Payer: Self-pay | Admitting: *Deleted

## 2014-09-04 NOTE — Telephone Encounter (Signed)
[name] would like to get the CT results and she is also having diarrhea with stomach pain. Taking the BENTYL that has stopped working. Her return phone number is [phone]. Patient advised Dr. Laural Golden is out of the office and once Tammy speaks with him , she will return her call. Voices understood.

## 2014-09-05 NOTE — Telephone Encounter (Signed)
Per Dr.[name] the patient may take Bentyl 20 mg Three times a day with a meal. A new prescription was called to [facility].  CT results reviewed with the patient per Dr.[name]. Patient was called and made aware. She states that she is going to take 2 of the 10 mg up till she finishes them , then get the other prescription. She will call Monday or Tuesday of next week with a Progress report. She is taking Imodium for diarrhea,it takes  3-4 to e effective. She is only taking on an as needed basis.  She is going to talk with her PCP about the Blood Sugar medicine,Metformin. Feels it may be her problem.

## 2014-10-08 ENCOUNTER — Ambulatory Visit (INDEPENDENT_AMBULATORY_CARE_PROVIDER_SITE_OTHER): Payer: BC Managed Care – PPO | Admitting: Internal Medicine

## 2014-10-08 ENCOUNTER — Encounter (INDEPENDENT_AMBULATORY_CARE_PROVIDER_SITE_OTHER): Payer: Self-pay | Admitting: Internal Medicine

## 2014-10-08 VITALS — BP 112/76 | HR 80 | Temp 98.4°F | Resp 18 | Ht 63.5 in | Wt 302.9 lb

## 2014-10-08 DIAGNOSIS — K589 Irritable bowel syndrome without diarrhea: Secondary | ICD-10-CM

## 2014-10-08 DIAGNOSIS — R740 Nonspecific elevation of levels of transaminase and lactic acid dehydrogenase [LDH]: Secondary | ICD-10-CM

## 2014-10-08 DIAGNOSIS — K50118 Crohn's disease of large intestine with other complication: Secondary | ICD-10-CM

## 2014-10-08 DIAGNOSIS — R1012 Left upper quadrant pain: Secondary | ICD-10-CM

## 2014-10-08 DIAGNOSIS — R7401 Elevation of levels of liver transaminase levels: Secondary | ICD-10-CM

## 2014-10-08 DIAGNOSIS — E669 Obesity, unspecified: Secondary | ICD-10-CM | POA: Insufficient documentation

## 2014-10-08 NOTE — Patient Instructions (Signed)
Will request copy of recent blood work from Dr. Daniels office for review. 

## 2014-10-08 NOTE — Progress Notes (Signed)
Presenting complaint;  Follow-up for Crohn's colitis, diarrhea and left upper quadrant abdominal pain.  Database;  Patient is 49 year old Caucasian female who has history of ulcerative colitis of 16 years duration. She has done well with 6-MP. She wondered, off 6-MP. She had colonoscopy in July 2014 when she was felt to be in remission and therefore 6-MP was discontinued. Follow-up last year she developed diarrhea with multiple bowel movements of left upper quadrant abdominal pain. She was treated with prednisone by Dr. Gar Ponto and sent over for evaluation. She underwent flexible sigmoidoscopy on 08/21/2014 to mid transverse colon. She was noted have multiple small polyps which are inflammatory. Disease was felt to be in remission. She was begun on dicyclomine 10 mg 3 times a day and she did not feel any better. Doses and increase to 20 mg 3 times a day. She also had abdominopelvic CT and no cause was found to account for her pain. She is presently on disability through August 2016.  Subjective;  Patient here for scheduled visit. She states she is 50% better as well as her diarrhea and left upper quadrant abdominal pain is concerned. She is having anywhere from 2-4 bowel movements per day. Stool consistency varies from loose to formed stool. She denies rectal bleeding or melena. She has occasional nocturnal diarrhea. She has not had any accidents. She has noted blurred vision with dicyclomine but it seems to get better as the day progresses. She states blurred vision is getting better. She has daily pain. It's intermittent and at times intense. She believes dicyclomine is helping. She also complains of chronic low back pain which she describes to be different pain. She says she has no disc between L4 and L5. She remains under a lot of stress on account of her father's illness who has Parkinson's disease and is not doing well. She has not been able to walk and exercise. She feels tired and fatigued  all the time. She had recent blood work by Dr. Gar Ponto. Please note patient is not taking naproxen anymore. She stopped it after 5 days.   Current Medications: Outpatient Encounter Prescriptions as of 10/08/2014  Medication Sig  . albuterol (PROVENTIL,VENTOLIN) 90 MCG/ACT inhaler Inhale 2 puffs into the lungs every 6 (six) hours as needed. Shortness of breath  . cetirizine (ZYRTEC) 10 MG tablet Take 10 mg by mouth every morning.   . citalopram (CELEXA) 20 MG tablet Take 20 mg by mouth every morning.   . dicyclomine (BENTYL) 20 MG tablet Take 20 mg by mouth 3 (three) times daily before meals.   Marland Kitchen diltiazem (TIAZAC) 360 MG 24 hr capsule Take 360 mg by mouth every morning.   . Fluticasone-Salmeterol (ADVAIR) 250-50 MCG/DOSE AEPB Inhale 1 puff into the lungs every morning.   Marland Kitchen JARDIANCE 25 MG TABS tablet 25 mg daily.   Marland Kitchen lisinopril-hydrochlorothiazide (PRINZIDE,ZESTORETIC) 20-12.5 MG per tablet Take 2 tablets by mouth every morning.   . medroxyPROGESTERone (DEPO-PROVERA) 150 MG/ML injection Inject 150 mg into the muscle every 3 (three) months.  . mesalamine (PENTASA) 500 MG CR capsule Take 2 capsules (1,000 mg total) by mouth 4 (four) times daily.  . Multiple Vitamin (MULITIVITAMIN WITH MINERALS) TABS Take 1 tablet by mouth every morning.   . naproxen (NAPROSYN) 500 MG tablet Take 500 mg by mouth 2 (two) times daily with a meal.   . spironolactone (ALDACTONE) 25 MG tablet Take 25 mg by mouth every morning.   Marland Kitchen ALPRAZolam (XANAX) 0.5 MG tablet Take 0.5 tablets (  0.25 mg total) by mouth 2 (two) times daily. For sleep (Patient not taking: Reported on 10/08/2014)  . [DISCONTINUED] dicyclomine (BENTYL) 10 MG capsule Take 1 capsule (10 mg total) by mouth 3 (three) times daily before meals. (Patient not taking: Reported on 10/08/2014)  . [DISCONTINUED] metFORMIN (GLUCOPHAGE-XR) 500 MG 24 hr tablet Take 500 mg by mouth every evening.  . [DISCONTINUED] predniSONE (DELTASONE) 5 MG tablet Take 10 mg by mouth  daily with breakfast.      Objective: Blood pressure 112/76, pulse 80, temperature 98.4 F (36.9 C), temperature source Oral, resp. rate 18, height 5' 3.5" (1.613 m), weight 302 lb 14.4 oz (137.395 kg). Patient is alert and in no acute distress. Conjunctiva is pink. Sclera is nonicteric Oropharyngeal mucosa is normal. No neck masses or thyromegaly noted. Cardiac exam with regular rhythm normal S1 and S2. No murmur or gallop noted. Lungs are clear to auscultation. Abdomen is obese. Bowel sounds are normal. She has mild to moderate tenderness at left upper quadrant. Elsewhere she has mild tenderness. No organomegaly or masses.  No LE edema or clubbing noted.  Labs/studies Results: Recent blood work by Dr. Gar Ponto has been requested.  Assessment:  #1. Crohn's colitis of 16 years duration. She remains on remission. Presently she is on 4 g of Pentasa daily. #2.  Diarrhea appears to be secondary to IBS. She is doing much better with dicyclomine at 60 mg per day. #3. Recurrent left upper quadrant abdominal pain. Workup is negative. Some of her pain may be due to IBS but she could also have neuropathy or referred pain from her back. If pain gets worse she will need to be further evaluated for which she'll have to be deferred to Dr. Gar Ponto. #4. Obesity. Patient advised to talk with Dr. Quillian Quince about referral to dietitian who has expertise in management of obesity.   Plan:  Requests copy of recent blood work from Dr. Olena Heckle office. Continue Pentasa and dicyclomine at present dose. Office visit in 6 months.

## 2014-11-13 ENCOUNTER — Telehealth (INDEPENDENT_AMBULATORY_CARE_PROVIDER_SITE_OTHER): Payer: Self-pay | Admitting: *Deleted

## 2014-11-13 NOTE — Telephone Encounter (Signed)
[name] is still having issues with IBS. The medicine she is currently taking is no longer working. Her return phone number is [phone].

## 2014-11-14 NOTE — Telephone Encounter (Signed)
To discuss with Dr.[name] and the patient will be called when decision is made.

## 2014-11-15 NOTE — Telephone Encounter (Signed)
I spoke with the patient on 11/14/14. She states that she was so nauseated and diarrhea (4-5 times daily), that she went to her PCP. She states that the Dicyclomine is not working. PCP prescribed Xifaxan 550 mg for 14 days, to see if this will help her.  Dr.[name] was made aware and had no further recommendation at this time.

## 2014-11-22 ENCOUNTER — Telehealth (INDEPENDENT_AMBULATORY_CARE_PROVIDER_SITE_OTHER): Payer: Self-pay | Admitting: *Deleted

## 2014-11-22 NOTE — Telephone Encounter (Signed)
When I spoke with [name], she told me that she had went to PCP as she was having excessive BM's a day. She shares with me that Dr.Daniels put her on Xifaxan-IBS/D to see if this would help . Patient was advised that this would be discussed with Dr.[name] and if there was any additional inforamtion, I would call her. See below.  I spoke with the patient on 11/14/14. She states that she was so nauseated and diarrhea (4-5 times daily), that she went to her PCP. She states that the Dicyclomine is not working. PCP prescribed Xifaxan 550 mg for 14 days, to see if this will help her.  Dr.[name] was made aware and had no further recommendation at this time.          I have spoke with Dr.[name], he states that the patient will need to have a OV,possibly a Colonoscopy for the document  Findings,before being possibly being put on medications.

## 2014-11-22 NOTE — Telephone Encounter (Signed)
[name] said since she spoke with [name] last week, her PCP started her on Xifaxan. She started bleeding so she stopped the Xifaxan then started the Imodium. Still bleeding at this time. Her return phone number is [phone].

## 2014-11-22 NOTE — Telephone Encounter (Signed)
Patient was called and she states that the bleeding is not all the time . Just at times. She describes them as chunky with blood and mucous. This morning she says that upon awaking she experienced horrible abdominal pain all across her abdomen. She was advised that we would see her in the office on Monday at 1:30. If her symptoms worsened she should go to the ED for further evaluation ,and she was made aware that Dr.[name] was the GI on call if she should need to call. I would contact Dr.[name] to see if there was any medication that may need to be given and if so I would call her back, otherwise we would see her on Monday afternoon @ 1:30 pm.

## 2014-11-26 ENCOUNTER — Ambulatory Visit (INDEPENDENT_AMBULATORY_CARE_PROVIDER_SITE_OTHER): Payer: BC Managed Care – PPO | Admitting: Internal Medicine

## 2014-11-26 ENCOUNTER — Encounter (INDEPENDENT_AMBULATORY_CARE_PROVIDER_SITE_OTHER): Payer: Self-pay | Admitting: Internal Medicine

## 2014-11-26 VITALS — BP 122/40 | HR 100 | Temp 98.5°F | Ht 63.0 in | Wt 302.0 lb

## 2014-11-26 DIAGNOSIS — K50118 Crohn's disease of large intestine with other complication: Secondary | ICD-10-CM

## 2014-11-26 MED ORDER — PREDNISONE 10 MG PO TABS: 10.0000 mg | ORAL_TABLET | Freq: Every day | ORAL | Status: DC

## 2014-11-26 MED ORDER — CIPROFLOXACIN HCL 500 MG PO TABS: 500.0000 mg | ORAL_TABLET | Freq: Two times a day (BID) | ORAL | Status: DC

## 2014-11-26 MED ORDER — METRONIDAZOLE 500 MG PO TABS: 500.0000 mg | ORAL_TABLET | Freq: Two times a day (BID) | ORAL | Status: DC

## 2014-11-26 NOTE — Progress Notes (Signed)
Subjective:    Patient ID: [name], female    DOB: 03/13/1966, 49 y.o.   MRN: [id number]  HPI  She was last seen by Dr. Laural Golden in February. Hx of Crohn's colitis of 16 yr duration.  In July of 2014 she underwent a colonoscopy and was felt to be in remission. In 08/21/2014 she underwent a flexible sigmoidoscopy to mid-transverse colon. Noted to have multiple small polyps which are inflammatory. None of polyps were adenomas.  Disease was felt to be in remission.  Today she presents that November 11, 2014, that weekend she was going to the [place] more (diarrhea). She talked to [facility] and was started on the 16th for diarrhea. She says it did not help. On the 18th of March she started having rectal bleeding and having 7-8 stools a day. She was not taking Imodium then. On the 21st, she called our office. She tells me the bleeding is not every time she has a BM. The blood is streaked in her stools and some mucous. She is having 4-6 stools a day. She stopped the Xifaxan on the 21st. She says Xifaxan makes the diarrhea worse. She is taking Imodium once a and if she continues to have diarrhea she will take another one. She has not had a loose stool since yesterday.  She has had two episodes of incontinence. There has been no fever. She did have flushing in her face Saturday x 30 minutes. Occurred while she was eating lunch.  Today she feels pretty good.   Appetite has been good. She has been eating bland foods. She says she has some discomfort across her upper abdomen and down her left side. She has had nausea but no vomiting.  No recent antibiotics.  She tested positive for c-diff in September at Dr. Arcola Jansky and given an Rx for Flagyl and Prednisone.    Review of Systems Past Medical History  Diagnosis Date  . Crohn disease   . High cholesterol   . Diabetes mellitus     diet controlled  . Hypertension     pcp  Dr Coralyn Mark Quillian Quince    [place]  . Asthma   . Anxiety   . Sleep apnea 03-2009 study    pt forgot to mention at pre admit visit.  uses c-pap brought her own  machine from home.   . Arthritis     Past Surgical History  Procedure Laterality Date  . Tonsillectomy    . Cyst removed from left hand    . Total hip arthroplasty  04/18/2012    Procedure: TOTAL HIP ARTHROPLASTY;  Surgeon: Marybelle Killings, MD;  Location: [place];  Service: Orthopedics;  Laterality: Right;  Right Total Hip Arthroplasty  . Colonoscopy N/A 03/16/2013    Procedure: COLONOSCOPY;  Surgeon: Rogene Houston, MD;  Location: [facility];  Service: Endoscopy;  Laterality: N/A;  255  . Cholecystectomy N/A 03/31/2013    Procedure: LAPAROSCOPIC CHOLECYSTECTOMY;  Surgeon: Jamesetta So, MD;  Location: [facility];  Service: General;  Laterality: N/A;  . Flexible sigmoidoscopy N/A 08/21/2014    Procedure: FLEXIBLE SIGMOIDOSCOPY;  Surgeon: Rogene Houston, MD;  Location: [facility];  Service: Endoscopy;  Laterality: N/A;  1030    Allergies  Allergen Reactions  . Remicade [Infliximab] Swelling    Joints locked up  . Cefprozil Rash  . Penicillins Rash    Current Outpatient Prescriptions on File Prior to Visit  Medication Sig Dispense Refill  . albuterol (PROVENTIL,VENTOLIN) 90  MCG/ACT inhaler Inhale 2 puffs into the lungs every 6 (six) hours as needed. Shortness of breath    . cetirizine (ZYRTEC) 10 MG tablet Take 10 mg by mouth every morning.     . citalopram (CELEXA) 20 MG tablet Take 20 mg by mouth every morning.     . dicyclomine (BENTYL) 20 MG tablet Take 20 mg by mouth 3 (three) times daily before meals.     Marland Kitchen diltiazem (TIAZAC) 360 MG 24 hr capsule Take 360 mg by mouth every morning.     . Fluticasone-Salmeterol (ADVAIR) 250-50 MCG/DOSE AEPB Inhale 1 puff into the lungs every morning.     Marland Kitchen JARDIANCE 25 MG TABS tablet 25 mg daily.     Marland Kitchen lisinopril-hydrochlorothiazide (PRINZIDE,ZESTORETIC) 20-12.5 MG per tablet Take 2 tablets by mouth every morning.     . medroxyPROGESTERone (DEPO-PROVERA) 150 MG/ML injection  Inject 150 mg into the muscle every 3 (three) months.    . mesalamine (PENTASA) 500 MG CR capsule Take 2 capsules (1,000 mg total) by mouth 4 (four) times daily. 240 capsule 3  . Multiple Vitamin (MULITIVITAMIN WITH MINERALS) TABS Take 1 tablet by mouth every morning.     Marland Kitchen spironolactone (ALDACTONE) 25 MG tablet Take 25 mg by mouth every morning.      No current facility-administered medications on file prior to visit.        Objective:   Physical Exam  Filed Vitals:   11/26/14 1346  Height: 5\' 3"  (1.6 m)  Weight: 302 lb (136.986 kg)  Alert and oriented. Skin warm and dry. Oral mucosa is moist.   . Sclera anicteric, conjunctivae is pink. Thyroid not enlarged. No cervical lymphadenopathy. Lungs clear. Heart regular rate and rhythm.  Abdomen is soft. Bowel sounds are positive. No hepatomegaly. No abdominal masses felt. No tenderness.  No edema to lower extremities. Obese.    Stool brown and guaiac positive.   Developer: [id number], Exp 9-17 Card: Lot 02-14, Exp 07.18    Assessment & Plan:  Probable Crohn's flare. Will start Cipro 500mg  BID x 10 days. Flagyl 500mg  BID x 10 days, Prednisone 25mg  and reduce by 5mg  weekly. PR next week. CBC with diff, CRP, and GI pathogen.  OV in 4 weeks.

## 2014-11-26 NOTE — Patient Instructions (Signed)
OV in 4 weeks.  

## 2014-11-27 LAB — CBC WITH DIFFERENTIAL/PLATELET
Basophils Absolute: 0 10*3/uL (ref 0.0–0.1)
Basophils Relative: 0 % (ref 0–1)
Eosinophils Absolute: 0.4 10*3/uL (ref 0.0–0.7)
Eosinophils Relative: 4 % (ref 0–5)
HCT: 43.5 % (ref 36.0–46.0)
Hemoglobin: 14.6 g/dL (ref 12.0–15.0)
Lymphocytes Relative: 23 % (ref 12–46)
Lymphs Abs: 2.3 10*3/uL (ref 0.7–4.0)
MCH: 32.4 pg (ref 26.0–34.0)
MCHC: 33.6 g/dL (ref 30.0–36.0)
MCV: 96.5 fL (ref 78.0–100.0)
MPV: 10.3 fL (ref 8.6–12.4)
Monocytes Absolute: 0.7 10*3/uL (ref 0.1–1.0)
Monocytes Relative: 7 % (ref 3–12)
Neutro Abs: 6.5 10*3/uL (ref 1.7–7.7)
Neutrophils Relative %: 66 % (ref 43–77)
Platelets: 304 10*3/uL (ref 150–400)
RBC: 4.51 MIL/uL (ref 3.87–5.11)
RDW: 12.1 % (ref 11.5–15.5)
WBC: 9.9 10*3/uL (ref 4.0–10.5)

## 2014-11-27 LAB — C-REACTIVE PROTEIN: CRP: 0.5 mg/dL (ref <0.60)

## 2014-11-28 LAB — GASTROINTESTINAL PATHOGEN PANEL PCR
C. difficile Tox A/B, PCR: NEGATIVE
Campylobacter, PCR: NEGATIVE
Cryptosporidium, PCR: NEGATIVE
E coli (ETEC) LT/ST PCR: NEGATIVE
E coli (STEC) stx1/stx2, PCR: NEGATIVE
E coli 0157, PCR: NEGATIVE
Giardia lamblia, PCR: NEGATIVE
Norovirus, PCR: NEGATIVE
Rotavirus A, PCR: NEGATIVE
Salmonella, PCR: NEGATIVE
Shigella, PCR: NEGATIVE

## 2014-11-28 NOTE — Telephone Encounter (Signed)
Patient was seen by Deberah Castle, NP on 11/26/14 and will return in 4 weeks for a f/u.

## 2014-12-08 ENCOUNTER — Other Ambulatory Visit (INDEPENDENT_AMBULATORY_CARE_PROVIDER_SITE_OTHER): Payer: Self-pay | Admitting: Internal Medicine

## 2014-12-10 ENCOUNTER — Other Ambulatory Visit (INDEPENDENT_AMBULATORY_CARE_PROVIDER_SITE_OTHER): Payer: Self-pay | Admitting: Internal Medicine

## 2014-12-12 ENCOUNTER — Telehealth (INDEPENDENT_AMBULATORY_CARE_PROVIDER_SITE_OTHER): Payer: Self-pay | Admitting: Internal Medicine

## 2014-12-12 MED ORDER — MESALAMINE ER 500 MG PO CPCR: 1000.0000 mg | ORAL_CAPSULE | Freq: Four times a day (QID) | ORAL | Status: DC

## 2014-12-12 NOTE — Telephone Encounter (Signed)
Rx Pentasa refilled

## 2014-12-17 ENCOUNTER — Telehealth (INDEPENDENT_AMBULATORY_CARE_PROVIDER_SITE_OTHER): Payer: Self-pay | Admitting: *Deleted

## 2014-12-17 NOTE — Telephone Encounter (Signed)
Per Dr.[name] the patient needs to have a Colonoscopy done. Patient has not been called yet.

## 2014-12-17 NOTE — Telephone Encounter (Signed)
Please let Dr. Laural Golden know = Jordana was seen on 11/26/14 by Deberah Castle, NP. She is not any better. Took the antibiotics and Prednisone. Still bleeding, going to the bathroom, in a nervous jerk and not sleeping. Her return phone number is [phone]

## 2014-12-18 ENCOUNTER — Other Ambulatory Visit (INDEPENDENT_AMBULATORY_CARE_PROVIDER_SITE_OTHER): Payer: Self-pay | Admitting: *Deleted

## 2014-12-18 ENCOUNTER — Telehealth (INDEPENDENT_AMBULATORY_CARE_PROVIDER_SITE_OTHER): Payer: Self-pay | Admitting: *Deleted

## 2014-12-18 DIAGNOSIS — K50111 Crohn's disease of large intestine with rectal bleeding: Secondary | ICD-10-CM

## 2014-12-18 DIAGNOSIS — Z1211 Encounter for screening for malignant neoplasm of colon: Secondary | ICD-10-CM

## 2014-12-18 DIAGNOSIS — R197 Diarrhea, unspecified: Secondary | ICD-10-CM

## 2014-12-18 MED ORDER — PEG 3350-KCL-NA BICARB-NACL 420 G PO SOLR: 4000.0000 mL | Freq: Once | ORAL | Status: DC

## 2014-12-18 NOTE — Telephone Encounter (Signed)
TCS sch'd 12/31/14, patient aware

## 2014-12-18 NOTE — Telephone Encounter (Signed)
Patient needs trilyte 

## 2014-12-27 ENCOUNTER — Telehealth (INDEPENDENT_AMBULATORY_CARE_PROVIDER_SITE_OTHER): Payer: Self-pay | Admitting: *Deleted

## 2014-12-27 NOTE — Telephone Encounter (Signed)
[name] LM for Dr. Laural Golden. Her bleeding has picked up to all the time. She is weak and has loss stools. Her TCS procedure is scheduled for 12/31/14. The return phone number is [phone].

## 2014-12-27 NOTE — Telephone Encounter (Signed)
Patient ask that I call in the Prednisone. A prescription was called to [facility] for Prednisone 30 mg daily 1 month with 1 refill. Patient states that she will do the full liquid diet. If symptoms worsen she will go to the ED.

## 2014-12-27 NOTE — Telephone Encounter (Signed)
Patient states that she is seeing blood every time she has a BM. Her stools are loose to diarrhea, blood is pink. She describes that it looks like chucks of meats with blood. She states that she has a external hemorrhoid,the blood from that is dark red and she has been using preparation H for this.  She states that she is weak , legs feel like they are going to collapse under her. Sweats profusely.  Patient is to have hr Colonoscopy Monday ,May 2.  Dr.[name] paged.  Per Dr.[name] patient may go back on Prednisone 30 mg daily If her symptoms worsen she should go to the ED for evaluation. Full Liquid Diet recommended.  Patient called and made aware.

## 2014-12-31 ENCOUNTER — Encounter (HOSPITAL_COMMUNITY)
Admission: RE | Disposition: A | Discharge status: Home / Self Care | Payer: Self-pay | Source: Ambulatory Visit | Attending: Internal Medicine

## 2014-12-31 ENCOUNTER — Ambulatory Visit (HOSPITAL_COMMUNITY)
Admission: RE | Admit: 2014-12-31 | Discharge: 2014-12-31 | Disposition: A | Discharge status: Home / Self Care | Payer: BC Managed Care – PPO | Source: Ambulatory Visit | Attending: Internal Medicine | Admitting: Internal Medicine

## 2014-12-31 DIAGNOSIS — J45909 Unspecified asthma, uncomplicated: Secondary | ICD-10-CM | POA: Diagnosis not present

## 2014-12-31 DIAGNOSIS — K50118 Crohn's disease of large intestine with other complication: Secondary | ICD-10-CM

## 2014-12-31 DIAGNOSIS — M1990 Unspecified osteoarthritis, unspecified site: Secondary | ICD-10-CM | POA: Insufficient documentation

## 2014-12-31 DIAGNOSIS — E119 Type 2 diabetes mellitus without complications: Secondary | ICD-10-CM | POA: Insufficient documentation

## 2014-12-31 DIAGNOSIS — F419 Anxiety disorder, unspecified: Secondary | ICD-10-CM | POA: Insufficient documentation

## 2014-12-31 DIAGNOSIS — K50111 Crohn's disease of large intestine with rectal bleeding: Secondary | ICD-10-CM

## 2014-12-31 DIAGNOSIS — R197 Diarrhea, unspecified: Secondary | ICD-10-CM

## 2014-12-31 DIAGNOSIS — Z880 Allergy status to penicillin: Secondary | ICD-10-CM | POA: Insufficient documentation

## 2014-12-31 DIAGNOSIS — K6289 Other specified diseases of anus and rectum: Secondary | ICD-10-CM | POA: Diagnosis not present

## 2014-12-31 DIAGNOSIS — Z9049 Acquired absence of other specified parts of digestive tract: Secondary | ICD-10-CM | POA: Insufficient documentation

## 2014-12-31 DIAGNOSIS — Z888 Allergy status to other drugs, medicaments and biological substances status: Secondary | ICD-10-CM | POA: Insufficient documentation

## 2014-12-31 DIAGNOSIS — K5289 Other specified noninfective gastroenteritis and colitis: Secondary | ICD-10-CM | POA: Insufficient documentation

## 2014-12-31 DIAGNOSIS — E780 Pure hypercholesterolemia: Secondary | ICD-10-CM | POA: Insufficient documentation

## 2014-12-31 DIAGNOSIS — D123 Benign neoplasm of transverse colon: Secondary | ICD-10-CM | POA: Diagnosis not present

## 2014-12-31 DIAGNOSIS — I10 Essential (primary) hypertension: Secondary | ICD-10-CM | POA: Insufficient documentation

## 2014-12-31 DIAGNOSIS — Z881 Allergy status to other antibiotic agents status: Secondary | ICD-10-CM | POA: Insufficient documentation

## 2014-12-31 DIAGNOSIS — Z96641 Presence of right artificial hip joint: Secondary | ICD-10-CM | POA: Insufficient documentation

## 2014-12-31 DIAGNOSIS — G4730 Sleep apnea, unspecified: Secondary | ICD-10-CM | POA: Diagnosis not present

## 2014-12-31 DIAGNOSIS — K529 Noninfective gastroenteritis and colitis, unspecified: Secondary | ICD-10-CM | POA: Diagnosis present

## 2014-12-31 HISTORY — PX: COLONOSCOPY: SHX5424

## 2014-12-31 LAB — CBC
HCT: 44.3 % (ref 36.0–46.0)
Hemoglobin: 14.8 g/dL (ref 12.0–15.0)
MCH: 32.5 pg (ref 26.0–34.0)
MCHC: 33.4 g/dL (ref 30.0–36.0)
MCV: 97.4 fL (ref 78.0–100.0)
Platelets: 256 10*3/uL (ref 150–400)
RBC: 4.55 MIL/uL (ref 3.87–5.11)
RDW: 12.2 % (ref 11.5–15.5)
WBC: 12.3 10*3/uL — ABNORMAL HIGH (ref 4.0–10.5)

## 2014-12-31 LAB — C-REACTIVE PROTEIN: CRP: 1.6 mg/dL — ABNORMAL HIGH (ref <1.0)

## 2014-12-31 SURGERY — COLONOSCOPY
Anesthesia: Moderate Sedation

## 2014-12-31 MED ORDER — PREDNISONE 10 MG PO TABS: 30.0000 mg | ORAL_TABLET | Freq: Every day | ORAL | Status: DC

## 2014-12-31 MED ORDER — MEPERIDINE HCL 50 MG/ML IJ SOLN
INTRAMUSCULAR | Status: DC
Start: 2014-12-31 — End: 2014-12-31
  Filled 2014-12-31: qty 1

## 2014-12-31 MED ORDER — STERILE WATER FOR IRRIGATION IR SOLN
Status: DC | PRN
  Administered 2014-12-31: 08:00:00

## 2014-12-31 MED ORDER — SODIUM CHLORIDE 0.9 % IV SOLN
INTRAVENOUS | Status: DC
  Administered 2014-12-31: 08:00:00 via INTRAVENOUS

## 2014-12-31 MED ORDER — PROMETHAZINE HCL 25 MG/ML IJ SOLN
INTRAMUSCULAR | Status: DC | PRN
  Administered 2014-12-31 (×2): 12.5 mg via INTRAVENOUS

## 2014-12-31 MED ORDER — PROMETHAZINE HCL 25 MG/ML IJ SOLN
INTRAMUSCULAR | Status: AC
  Filled 2014-12-31: qty 1

## 2014-12-31 MED ORDER — MIDAZOLAM HCL 5 MG/5ML IJ SOLN
INTRAMUSCULAR | Status: AC
  Filled 2014-12-31: qty 10

## 2014-12-31 MED ORDER — MIDAZOLAM HCL 5 MG/5ML IJ SOLN
INTRAMUSCULAR | Status: DC | PRN
  Administered 2014-12-31: 2 mg via INTRAVENOUS
  Administered 2014-12-31 (×2): 3 mg via INTRAVENOUS

## 2014-12-31 MED ORDER — MEPERIDINE HCL 50 MG/ML IJ SOLN
INTRAMUSCULAR | Status: DC | PRN
  Administered 2014-12-31 (×2): 25 mg via INTRAVENOUS

## 2014-12-31 MED ORDER — SODIUM CHLORIDE 0.9 % IJ SOLN
INTRAMUSCULAR | Status: AC
  Filled 2014-12-31: qty 3

## 2014-12-31 NOTE — Discharge Instructions (Signed)
Resume usual medications but take dicyclomine on as-needed basis. Resume usual diet. No driving for 24 hours. Physician will call with results of blood tests and biopsy.   Colonoscopy, Care After These instructions give you information on caring for yourself after your procedure. Your doctor may also give you more specific instructions. Call your doctor if you have any problems or questions after your procedure. HOME CARE  Do not drive for 24 hours.  Do not sign important papers or use machinery for 24 hours.  You may shower.  You may go back to your usual activities, but go slower for the first 24 hours.  Take rest breaks often during the first 24 hours.  Walk around or use warm packs on your belly (abdomen) if you have belly cramping or gas.  Drink enough fluids to keep your pee (urine) clear or pale yellow.  Resume your normal diet. Avoid heavy or fried foods.  Avoid drinking alcohol for 24 hours or as told by your doctor.  Only take medicines as told by your doctor. If a tissue sample (biopsy) was taken during the procedure:   Do not take aspirin or blood thinners for 7 days, or as told by your doctor.  Do not drink alcohol for 7 days, or as told by your doctor.  Eat soft foods for the first 24 hours. GET HELP IF: You still have a small amount of blood in your poop (stool) 2-3 days after the procedure. GET HELP RIGHT AWAY IF:  You have more than a small amount of blood in your poop.  You see clumps of tissue (blood clots) in your poop.  Your belly is puffy (swollen).  You feel sick to your stomach (nauseous) or throw up (vomit).  You have a fever.  You have belly pain that gets worse and medicine does not help. MAKE SURE YOU:  Understand these instructions.  Will watch your condition.  Will get help right away if you are not doing well or get worse. Document Released: 09/19/2010 Document Revised: 08/22/2013 Document Reviewed: 04/24/2013 [facility] Patient  Information 2015 [place], [place]. This information is not intended to replace advice given to you by your health care provider. Make sure you discuss any questions you have with your health care provider.

## 2014-12-31 NOTE — Op Note (Addendum)
COLONOSCOPY PROCEDURE REPORT  PATIENT:  [name]  MR#:  [id number] Birthdate:  [birth year], 49 y.o., female Endoscopist:  Dr. Rogene Houston, MD Referred By:  Dr. Gar Ponto, MD  Procedure Date: 12/31/2014  Procedure:   Colonoscopy  Indications: Patient is 49 year old Caucasian female who has history of Crohn's colitis who previously has been documented to be in remission now maintained on Pentasa presents with worsening diarrhea and rectal bleeding. Patient is back on prednisone. She is undergoing colonoscopy primarily to document relapse of disease and also rule out CMV colitis.  Informed Consent:  The procedure and risks were reviewed with the patient and informed consent was obtained.  Medications:  Demerol 50 mg IV Versed 10 mg IV Promethazine 25 mg IV and diluted form  Description of procedure:  After a digital rectal exam was performed, that colonoscope was advanced from the anus through the rectum and colon to the area of the cecum, ileocecal valve and appendiceal orifice. The cecum was deeply intubated. These structures were well-seen and photographed for the record. From the level of the cecum and ileocecal valve, the scope was slowly and cautiously withdrawn. The mucosal surfaces were carefully surveyed utilizing scope tip to flexion to facilitate fold flattening as needed. The scope was pulled down into the rectum where a thorough exam including retroflexion was performed.  Findings:   Prep satisfactory. Focal erythema edema and erosions noted at ileocecal valve. Focal erythema edema and erosions noted admitted sigmoid colon. Few small hyperplastic appearing polyps noted at distal transverse colon and these were left alone. Mucosa of descending colon was normal. Patchy edema erythema and erosions noted involving mucosa of distal sigmoid colon. Diffuse changes of erythema friable mucosa and multiple erosions involving rectal mucosa. Soft sentinel's skin tags noted  on digital exam.   Therapeutic/Diagnostic Maneuvers Performed:  Biopsies taken from cecum and the transverse colon and submitted together. Multiple biopsies taken from mucosa of sigmoid colon the rectum and submitted separately.  Complications:  None  Cecal Withdrawal Time:   19 minutes  Impression:  Examination performed to cecum. Few small hyperplastic appearing polyps at transverse colon and these were left alone. Patchy colitis involving cecum and mid transverse colon.  Patchy colitis involving distal sigmoid colon. Diffuse rectal involvement with edema erythema erosions and friable mucosa. Biopsies taken from cecum and transverse colon, sigmoid colon and rectum.   Recommendations:  Standard instructions given. Take dicyclomine 20 mg 3 times a day when necessary Will check CBC and CRP today. I will contact patient with blood and biopsy results and further recommendations.  [name],[name]  12/31/2014 8:38 AM  CC: Dr. Gar Ponto, MD & Dr. Rayne Du ref. provider found

## 2014-12-31 NOTE — H&P (Signed)
[name] is an 49 y.o. female.   Chief Complaint: Patient is here for colonoscopy. HPI: Patient is 49 year old Caucasian female was history of Crohn's colitis of 18 years duration and had done well while on 6-MP. She decided to come off 6-MP in July 2014 concern for potential side effects. She did have colonoscopy in July 2014 and she was in remission. She presented with diarrhea and abdominal pain back in December and underwent flexible sigmoidoscopy and was still felt to be in remission. Now she has developed worsening diarrhea abdominal pain and rectal bleeding and suspected to have relapse of her IBD. She had GI pathogen panel about 5 weeks ago and was negative. She is back on prednisone. She is having anywhere from 5-7 bowel movements per day. She does not have a good appetite.   Past Medical History  Diagnosis Date  . Crohn disease   . High cholesterol   . Diabetes mellitus     diet controlled  . Hypertension     pcp  Dr Coralyn Mark Quillian Quince    [place]  . Asthma   . Anxiety   . Sleep apnea 03-2009 study    pt forgot to mention at pre admit visit.  uses c-pap brought her own  machine from home.   . Arthritis     Past Surgical History  Procedure Laterality Date  . Tonsillectomy    . Cyst removed from left hand    . Total hip arthroplasty  04/18/2012    Procedure: TOTAL HIP ARTHROPLASTY;  Surgeon: Marybelle Killings, MD;  Location: [facility];  Service: Orthopedics;  Laterality: Right;  Right Total Hip Arthroplasty  . Colonoscopy N/A 03/16/2013    Procedure: COLONOSCOPY;  Surgeon: Rogene Houston, MD;  Location: [facility];  Service: Endoscopy;  Laterality: N/A;  255  . Cholecystectomy N/A 03/31/2013    Procedure: LAPAROSCOPIC CHOLECYSTECTOMY;  Surgeon: Jamesetta So, MD;  Location: [facility];  Service: General;  Laterality: N/A;  . Flexible sigmoidoscopy N/A 08/21/2014    Procedure: FLEXIBLE SIGMOIDOSCOPY;  Surgeon: Rogene Houston, MD;  Location: [facility];  Service: Endoscopy;  Laterality:  N/A;  1030    No family history on file. Social History:  reports that she has never smoked. She has never used smokeless tobacco. She reports that she does not drink alcohol or use illicit drugs.  Allergies:  Allergies  Allergen Reactions  . Remicade [Infliximab] Swelling    Joints locked up  . Cefprozil Rash  . Penicillins Rash    Medications Prior to Admission  Medication Sig Dispense Refill  . cetirizine (ZYRTEC) 10 MG tablet Take 10 mg by mouth every morning.     . citalopram (CELEXA) 20 MG tablet Take 20 mg by mouth every morning.     . dicyclomine (BENTYL) 20 MG tablet Take 20 mg by mouth 3 (three) times daily before meals.     Marland Kitchen diltiazem (TIAZAC) 360 MG 24 hr capsule Take 360 mg by mouth every morning.     . Fluticasone-Salmeterol (ADVAIR) 250-50 MCG/DOSE AEPB Inhale 1 puff into the lungs every morning.     Marland Kitchen JARDIANCE 25 MG TABS tablet 25 mg daily.     Marland Kitchen lisinopril-hydrochlorothiazide (PRINZIDE,ZESTORETIC) 20-12.5 MG per tablet Take 2 tablets by mouth every morning.     . loperamide (IMODIUM) 2 MG capsule Take by mouth as needed for diarrhea or loose stools.    . medroxyPROGESTERone (DEPO-PROVERA) 150 MG/ML injection Inject 150 mg into the muscle every  3 (three) months.    . mesalamine (PENTASA) 500 MG CR capsule Take 2 capsules (1,000 mg total) by mouth 4 (four) times daily. 240 capsule 11  . Multiple Vitamin (MULITIVITAMIN WITH MINERALS) TABS Take 1 tablet by mouth every morning.     Marland Kitchen PENTASA 500 MG CR capsule TAKE 2 CAPSULES BY MOUTH FOUR TIMES DAILY EMERGENCY REFILL FAXED DR 240 capsule 11  . polyethylene glycol-electrolytes (NULYTELY/GOLYTELY) 420 G solution Take 4,000 mLs by mouth once. 4000 mL 0  . spironolactone (ALDACTONE) 25 MG tablet Take 25 mg by mouth every morning.     Marland Kitchen albuterol (PROVENTIL,VENTOLIN) 90 MCG/ACT inhaler Inhale 2 puffs into the lungs every 6 (six) hours as needed. Shortness of breath    . ciprofloxacin (CIPRO) 500 MG tablet Take 1 tablet (500  mg total) by mouth 2 (two) times daily. (Patient not taking: Reported on 12/18/2014) 20 tablet 0  . metroNIDAZOLE (FLAGYL) 500 MG tablet Take 1 tablet (500 mg total) by mouth 2 (two) times daily. (Patient not taking: Reported on 12/18/2014) 20 tablet 0  . predniSONE (DELTASONE) 10 MG tablet Take 1 tablet (10 mg total) by mouth daily with breakfast. (Patient not taking: Reported on 12/18/2014) 100 tablet 0    No results found for this or any previous visit (from the past 48 hour(s)). No results found.  ROS  Blood pressure 125/75, pulse 102, temperature 98.3 F (36.8 C), temperature source Oral, resp. rate 18, height 5\' 6"  (1.676 m), weight 302 lb (136.986 kg), SpO2 96 %. Physical Exam  Constitutional:  Well-developed obese Caucasian female in NAD  HENT:  Mouth/Throat: Oropharynx is clear and moist.  Eyes: Conjunctivae are normal. No scleral icterus.  Neck: No thyromegaly present.  Cardiovascular: Normal rate, regular rhythm and normal heart sounds.   No murmur heard. Respiratory: Effort normal and breath sounds normal.  GI: Soft. She exhibits no mass. There is tenderness (mild tenderness across upper abdomen and at LLQ). There is no guarding.  Musculoskeletal: She exhibits no edema.  Lymphadenopathy:    She has no cervical adenopathy.  Neurological: She is alert.  Skin: Skin is warm and dry.     Assessment/Plan Diarrhea and rectal bleeding in patient with history of Crohn's disease. Diagnostic colonoscopy.  [name],[name] 12/31/2014, 7:45 AM

## 2015-01-03 ENCOUNTER — Other Ambulatory Visit (INDEPENDENT_AMBULATORY_CARE_PROVIDER_SITE_OTHER): Payer: Self-pay | Admitting: Internal Medicine

## 2015-01-04 ENCOUNTER — Encounter (HOSPITAL_COMMUNITY): Payer: Self-pay | Admitting: Internal Medicine

## 2015-01-04 ENCOUNTER — Encounter (INDEPENDENT_AMBULATORY_CARE_PROVIDER_SITE_OTHER): Payer: Self-pay | Admitting: *Deleted

## 2015-01-04 ENCOUNTER — Telehealth (INDEPENDENT_AMBULATORY_CARE_PROVIDER_SITE_OTHER): Payer: Self-pay | Admitting: Internal Medicine

## 2015-01-04 DIAGNOSIS — K5010 Crohn's disease of large intestine without complications: Secondary | ICD-10-CM

## 2015-01-04 MED ORDER — MERCAPTOPURINE 50 MG PO TABS: 100.0000 mg | ORAL_TABLET | Freq: Every day | ORAL | Status: DC

## 2015-01-04 NOTE — Telephone Encounter (Signed)
Rx sent to her pharmacy 

## 2015-01-07 ENCOUNTER — Ambulatory Visit (INDEPENDENT_AMBULATORY_CARE_PROVIDER_SITE_OTHER): Payer: BC Managed Care – PPO | Admitting: Internal Medicine

## 2015-01-07 ENCOUNTER — Telehealth (INDEPENDENT_AMBULATORY_CARE_PROVIDER_SITE_OTHER): Payer: Self-pay | Admitting: *Deleted

## 2015-01-07 DIAGNOSIS — K50918 Crohn's disease, unspecified, with other complication: Secondary | ICD-10-CM

## 2015-01-07 NOTE — Telephone Encounter (Signed)
Per Dr.[name] the patient will need to have labs drawn in 4 weeks and OV in 8 weeks. Patient is to have lab work in 02-04-15.  Per Dr.[name] cancel the appointment for the week of 01/14/15.

## 2015-01-10 ENCOUNTER — Telehealth (INDEPENDENT_AMBULATORY_CARE_PROVIDER_SITE_OTHER): Payer: Self-pay | Admitting: *Deleted

## 2015-01-10 NOTE — Telephone Encounter (Signed)
[name] has had major engery drop and light headed since she has started the 6MP. Her return phone number is [phone].

## 2015-01-10 NOTE — Telephone Encounter (Signed)
Patient called. She states that 2 1/2 hours after taking the 6 MP all of her energy is drained for the day. She is weak , feels disoriented,light headed , just funny. Hot flashes , inside of mouth is peeling. She ask if this medication and the prednisone is not mixing well.  She did see bright red blood in her stool this morning. Says this did not happen 2 years ago when she took it.  Call back [phone]

## 2015-01-10 NOTE — Telephone Encounter (Signed)
Apt has been scheduled for 02/25/15 at 9:30 am with Dr. Laural Golden.

## 2015-01-10 NOTE — Telephone Encounter (Signed)
Patient is going to research and let us know about moving forward with this medication.

## 2015-01-10 NOTE — Telephone Encounter (Signed)
Per Dr.[name] the patient should stop the 6 MP. Question trying Humira. Patient was called and made aware.

## 2015-01-14 NOTE — Telephone Encounter (Signed)
Delany said to let Tammy know she started back on her 6 MP on Sunday, 01/13/15. Her BP was running low and thinks that is was going on with her body. She has stopped the BP medicine and feels better. The return phone number is [phone].

## 2015-01-14 NOTE — Telephone Encounter (Signed)
Per Dr.[name] the patient is having Vasovagal Response. Ask that she take the Bentyl ,she needs to time it. If she is not on this medication take it before a BM,allowing 15 minutes for it to work.

## 2015-01-14 NOTE — Telephone Encounter (Signed)
Dr.[name] was made aware. 

## 2015-01-14 NOTE — Telephone Encounter (Signed)
Patient was called and made aware. 

## 2015-01-14 NOTE — Telephone Encounter (Signed)
Patient states that she is feeling better since stopping 1 of the 3 BP medications that she is taking. She has noticed today that prior to having a bowel movement she feels weak and becomes sweaty but after the BM she feels fine.

## 2015-01-23 ENCOUNTER — Encounter (INDEPENDENT_AMBULATORY_CARE_PROVIDER_SITE_OTHER): Payer: Self-pay | Admitting: *Deleted

## 2015-01-23 ENCOUNTER — Other Ambulatory Visit (INDEPENDENT_AMBULATORY_CARE_PROVIDER_SITE_OTHER): Payer: Self-pay | Admitting: *Deleted

## 2015-01-23 DIAGNOSIS — K50918 Crohn's disease, unspecified, with other complication: Secondary | ICD-10-CM

## 2015-02-05 LAB — CBC WITH DIFFERENTIAL/PLATELET
Basophils Absolute: 0 10*3/uL (ref 0.0–0.1)
Basophils Relative: 0 % (ref 0–1)
Eosinophils Absolute: 0.3 10*3/uL (ref 0.0–0.7)
Eosinophils Relative: 3 % (ref 0–5)
HCT: 42.9 % (ref 36.0–46.0)
Hemoglobin: 13.8 g/dL (ref 12.0–15.0)
Lymphocytes Relative: 29 % (ref 12–46)
Lymphs Abs: 2.8 10*3/uL (ref 0.7–4.0)
MCH: 32.4 pg (ref 26.0–34.0)
MCHC: 32.2 g/dL (ref 30.0–36.0)
MCV: 100.7 fL — ABNORMAL HIGH (ref 78.0–100.0)
MPV: 9.5 fL (ref 8.6–12.4)
Monocytes Absolute: 0.6 10*3/uL (ref 0.1–1.0)
Monocytes Relative: 6 % (ref 3–12)
Neutro Abs: 6 10*3/uL (ref 1.7–7.7)
Neutrophils Relative %: 62 % (ref 43–77)
Platelets: 350 10*3/uL (ref 150–400)
RBC: 4.26 MIL/uL (ref 3.87–5.11)
RDW: 14.1 % (ref 11.5–15.5)
WBC: 9.7 10*3/uL (ref 4.0–10.5)

## 2015-02-06 ENCOUNTER — Telehealth (INDEPENDENT_AMBULATORY_CARE_PROVIDER_SITE_OTHER): Payer: Self-pay | Admitting: *Deleted

## 2015-02-06 DIAGNOSIS — K5090 Crohn's disease, unspecified, without complications: Secondary | ICD-10-CM

## 2015-02-06 NOTE — Telephone Encounter (Signed)
Per Dr.[name] the patient will need to have labs drawn in 3 months , labs are noted for May 09, 2015.

## 2015-02-13 ENCOUNTER — Telehealth (INDEPENDENT_AMBULATORY_CARE_PROVIDER_SITE_OTHER): Payer: Self-pay | Admitting: *Deleted

## 2015-02-13 NOTE — Telephone Encounter (Signed)
[name] stopped taking her Prednisone on 02/09/15. She has started seeing blood in stool. The return phone number is [phone].

## 2015-02-13 NOTE — Telephone Encounter (Signed)
Patient called and advised to go back on prednisone 20 mg daily. She dropped dosed to 10 mg daily as she gets better and stay on it until office visit later this month.

## 2015-02-13 NOTE — Telephone Encounter (Signed)
Patient states that she completed the Prednisone on 02-09-15 , on 02-12-15 she started seeing clots of blood in her stool. Her bathroom visits have increased, when she eats it is going straight through her but not as bad as it was. She continues to take her Bentyl and Imodium.  Patient was advised that Dr.[name] would be made aware, any further recommendations either myself or Dr.[name] would call her ack.

## 2015-02-25 ENCOUNTER — Ambulatory Visit (INDEPENDENT_AMBULATORY_CARE_PROVIDER_SITE_OTHER): Payer: BC Managed Care – PPO | Admitting: Internal Medicine

## 2015-02-25 ENCOUNTER — Encounter (INDEPENDENT_AMBULATORY_CARE_PROVIDER_SITE_OTHER): Payer: Self-pay | Admitting: Internal Medicine

## 2015-02-25 VITALS — BP 130/82 | HR 100 | Temp 98.8°F | Resp 18 | Ht 63.0 in | Wt 299.6 lb

## 2015-02-25 DIAGNOSIS — K50111 Crohn's disease of large intestine with rectal bleeding: Secondary | ICD-10-CM

## 2015-02-25 MED ORDER — MESALAMINE 1000 MG RE SUPP: 1000.0000 mg | Freq: Every day | RECTAL | Status: DC

## 2015-02-25 NOTE — Progress Notes (Signed)
Presenting complaint;  Follow-up for Crohn's colitis.  Database  Patient has history of Crohn's colitis dating back to 1999. She remained in remission with 6-MP. She decided to come off 6-MP. She was documented to be in remission on colonoscopy of July 2014 and 6-MP was discontinued. Diarrhea abdominal pain and rectal bleeding relapsed. Therefore she underwent colonoscopy with biopsy and 12/31/2014 revealing active disease in cecum and transverse colon as well as sigmoid colon and rectum. Patient was placed back on 6-MP and maintain on prednisone with taper schedule. Patient called 12 days ago stating that her diarrhea and rectal bleeding was getting worse as she tapered her prednisone. Patient was advised to go back on prednisone 20 mg daily.  Subjective;  Patient feels better. She is continuing to notice blood with her bowel movements every day. It is generally in the form of streaks coating the stool or on wiping. She is having 2-4 bowel movements per day. Most of her stools are formed. She is on dicyclomine before each meal and is taking 1-2 doses of Imodium daily. She continues to complain of pain across her upper abdomen. Pain generally occurs just before a bowel movement or after she eats. Her appetite is not normal. She has lost 3 pounds since her last visit she denies nausea vomiting fever or chills. She is not having any side effects with 6-MP. She says she does exercise on some days on her elliptical usually 5-15 minutes at a time. Back pain limits her ability to exercise.   Current Medications: Outpatient Encounter Prescriptions as of 02/25/2015  Medication Sig  . albuterol (PROVENTIL,VENTOLIN) 90 MCG/ACT inhaler Inhale 2 puffs into the lungs every 6 (six) hours as needed. Shortness of breath  . cetirizine (ZYRTEC) 10 MG tablet Take 10 mg by mouth every morning.   . citalopram (CELEXA) 20 MG tablet Take 20 mg by mouth every morning.   . dicyclomine (BENTYL) 20 MG tablet Take 1 tablet  (20 mg total) by mouth 3 (three) times daily as needed for spasms.  Marland Kitchen diltiazem (TIAZAC) 360 MG 24 hr capsule Take 360 mg by mouth every morning.   . Fluticasone-Salmeterol (ADVAIR) 250-50 MCG/DOSE AEPB Inhale 1 puff into the lungs every morning.   Marland Kitchen JARDIANCE 25 MG TABS tablet 25 mg daily.   Marland Kitchen lisinopril-hydrochlorothiazide (PRINZIDE,ZESTORETIC) 20-12.5 MG per tablet Take 2 tablets by mouth every morning.   . loperamide (IMODIUM) 2 MG capsule Take by mouth as needed for diarrhea or loose stools.  . medroxyPROGESTERone (DEPO-PROVERA) 150 MG/ML injection Inject 150 mg into the muscle every 3 (three) months.  . mercaptopurine (PURINETHOL) 50 MG tablet Take 2 tablets (100 mg total) by mouth daily. Give on an empty stomach 1 hour before or 2 hours after meals. Caution: Chemotherapy.  . mesalamine (PENTASA) 500 MG CR capsule Take 2 capsules (1,000 mg total) by mouth 4 (four) times daily.  . Multiple Vitamin (MULITIVITAMIN WITH MINERALS) TABS Take 1 tablet by mouth every morning.   . predniSONE (DELTASONE) 10 MG tablet Take 3 tablets (30 mg total) by mouth daily with breakfast. (Patient taking differently: Take 20 mg by mouth daily with breakfast. )  . spironolactone (ALDACTONE) 25 MG tablet Take 25 mg by mouth every morning.   . [DISCONTINUED] PENTASA 500 MG CR capsule TAKE 2 CAPSULES BY MOUTH FOUR TIMES DAILY EMERGENCY REFILL FAXED DR (Patient not taking: Reported on 02/25/2015)   No facility-administered encounter medications on file as of 02/25/2015.     Objective: Blood pressure 130/82, pulse  100, temperature 98.8 F (37.1 C), temperature source Oral, resp. rate 18, height 5\' 3"  (1.6 m), weight 299 lb 9.6 oz (135.898 kg). Patient is alert and in no acute distress. Conjunctiva is pink. Sclera is nonicteric Oropharyngeal mucosa is normal. No neck masses or thyromegaly noted. Cardiac exam with regular rhythm normal S1 and S2. No murmur or gallop noted. Lungs are clear to auscultation. Abdomen is  obese. Bowel sounds are normal. On palpation abdomen is soft with mild tenderness in both upper quadrants. Liver edge is indistinct felt to be 4-5 cm below RCM. No LE edema or clubbing noted.  Labs/studies Results: CBC from 02/04/2015  WBC 9.7 ANC 6000  H&H 13.8 and 42.9 and platelet count 350K   Assessment:  #1. Crohn's colitis. She is presently on three medications(6-MP, Pentasa and prednisone) and slowly improving. Daily hematochezia is most likely due to rectal disease and she may benefit from few weeks topical therapy with mesalamine suppositories. She has not been on 6-MP long enough.   Plan:  Canasa suppository 1 g PR daily at bedtime for 8-12 weeks. CBC with differential in 10 weeks. Office visit in 3 months.

## 2015-02-25 NOTE — Patient Instructions (Signed)
Start tapering prednisone as soon as rectal bleeding has stopped. Drop dose by 5 mg every week.

## 2015-04-01 ENCOUNTER — Ambulatory Visit (INDEPENDENT_AMBULATORY_CARE_PROVIDER_SITE_OTHER): Payer: BC Managed Care – PPO | Admitting: Internal Medicine

## 2015-04-04 ENCOUNTER — Telehealth (INDEPENDENT_AMBULATORY_CARE_PROVIDER_SITE_OTHER): Payer: Self-pay | Admitting: *Deleted

## 2015-04-04 DIAGNOSIS — K50111 Crohn's disease of large intestine with rectal bleeding: Secondary | ICD-10-CM

## 2015-04-04 NOTE — Telephone Encounter (Signed)
Spoke with [name] today.  She had called yesterday about her FMLA paperwork but she also wanted to let you know she came off Prednisone on Saturday (took last one) now is seeing blood again in her stool.  She has "blood clot" looking more in the morning with her stool and is having around 4-6 bowel movements daily.  She is taking Imodium and still is having 4-6 stools a day.  She is hurting also in her upper part of her abdomen.  She thinks you told her the transverse colon.  I have saved a spot for her to be seen by Terri Monday.  With these symptoms and the possiblity of extending her FMLA I think she should be seen.  What is your thought?

## 2015-04-04 NOTE — Telephone Encounter (Signed)
Per Dr.[name] - patient needs to have a CBC, CRP drawn. She may go back on Prednisone 10 mg and see Terri on 04/08/15.

## 2015-04-05 LAB — C-REACTIVE PROTEIN: CRP: 0.5 mg/dL (ref <0.60)

## 2015-04-05 LAB — CBC
HCT: 45.6 % (ref 36.0–46.0)
Hemoglobin: 15.3 g/dL — ABNORMAL HIGH (ref 12.0–15.0)
MCH: 33.6 pg (ref 26.0–34.0)
MCHC: 33.6 g/dL (ref 30.0–36.0)
MCV: 100.2 fL — ABNORMAL HIGH (ref 78.0–100.0)
MPV: 9.7 fL (ref 8.6–12.4)
Platelets: 355 10*3/uL (ref 150–400)
RBC: 4.55 MIL/uL (ref 3.87–5.11)
RDW: 13.2 % (ref 11.5–15.5)
WBC: 8.1 10*3/uL (ref 4.0–10.5)

## 2015-04-08 ENCOUNTER — Telehealth (INDEPENDENT_AMBULATORY_CARE_PROVIDER_SITE_OTHER): Payer: Self-pay | Admitting: *Deleted

## 2015-04-08 ENCOUNTER — Ambulatory Visit (INDEPENDENT_AMBULATORY_CARE_PROVIDER_SITE_OTHER): Payer: BC Managed Care – PPO | Admitting: Internal Medicine

## 2015-04-08 ENCOUNTER — Encounter (INDEPENDENT_AMBULATORY_CARE_PROVIDER_SITE_OTHER): Payer: Self-pay | Admitting: Internal Medicine

## 2015-04-08 ENCOUNTER — Encounter (INDEPENDENT_AMBULATORY_CARE_PROVIDER_SITE_OTHER): Payer: Self-pay | Admitting: *Deleted

## 2015-04-08 VITALS — BP 94/50 | HR 88 | Temp 98.9°F | Ht 63.0 in | Wt 302.9 lb

## 2015-04-08 DIAGNOSIS — K50911 Crohn's disease, unspecified, with rectal bleeding: Secondary | ICD-10-CM

## 2015-04-08 NOTE — Patient Instructions (Addendum)
Prednisone 10mg  till end of August and then reduce to 5 mg.  Canasa 1gm hs x 2 weeks. OV 1 month CBC and CRP 2 week.

## 2015-04-08 NOTE — Telephone Encounter (Signed)
Open in error

## 2015-04-08 NOTE — Telephone Encounter (Signed)
[name] , patient has an appointment with you today @ 1:30 pm. Here are the results from recent labs. Dr.[name] could not reach her.  Patient called no answer.     CRP is now normal.    Hemoglobin is 15.3; it was 13.8 two months ago.    Please call patient with results unless she has an appointment this week.

## 2015-04-08 NOTE — Telephone Encounter (Signed)
noted 

## 2015-04-08 NOTE — Telephone Encounter (Signed)
.  Per Terri Setzer,NP patient to have labs in 2 weeks. 

## 2015-04-08 NOTE — Progress Notes (Signed)
Subjective:    Patient ID: [name], female    DOB: [birth year], 49 y.o.   MRN: [id number]  HPI Here today for f/u.  She of Crohn's colitis dating back to 1999. Presently taking 6-MP 100mg , Pentasa 1000mg  QID. On Prednisone 10mg  which she started Friday. Had finished the Prednisone 6 days before starting back.  On colonoscopy in 2014 she was documented to be in remission and 6-MP was discontinued.  12/31/2014 Colonoscopy revealed active disease in cecum and transverse colon and well as sigmoid colon and rectum. She was placed back on 6MP and mainted on Prednisone taper.  She tells me she is still having diarrhea episodes. She will have diarrhea at least once a day. She has seen blood when she wipes and in her stool. Occuring with every stool. This flare started 2 weeks ago. She is having 3-5 stools a day. 90% of the time stool are loose. She is cramping in her "transverse colon" and both of her sides. Her appetite is okay. There has been no weight loss. She has actually gained 4 pounds.  CBC    Component Value Date/Time   WBC 8.1 04/04/2015 1210   RBC 4.55 04/04/2015 1210   HGB 15.3* 04/04/2015 1210   HCT 45.6 04/04/2015 1210   PLT 355 04/04/2015 1210   MCV 100.2* 04/04/2015 1210   MCH 33.6 04/04/2015 1210   MCHC 33.6 04/04/2015 1210   RDW 13.2 04/04/2015 1210   LYMPHSABS 2.8 02/04/2015 1400   MONOABS 0.6 02/04/2015 1400   EOSABS 0.3 02/04/2015 1400   BASOSABS 0.0 02/04/2015 1400  04/04/2015 CRP 0.5    Review of Systems Past Medical History  Diagnosis Date  . Crohn disease   . High cholesterol   . Diabetes mellitus     diet controlled  . Hypertension     pcp  Dr Coralyn Mark Quillian Quince    [place]  . Asthma   . Anxiety   . Sleep apnea 03-2009 study    pt forgot to mention at pre admit visit.  uses c-pap brought her own  machine from home.   . Arthritis     Past Surgical History  Procedure Laterality Date  . Tonsillectomy    . Cyst removed from left hand    . Total hip  arthroplasty  04/18/2012    Procedure: TOTAL HIP ARTHROPLASTY;  Surgeon: Marybelle Killings, MD;  Location: [facility];  Service: Orthopedics;  Laterality: Right;  Right Total Hip Arthroplasty  . Colonoscopy N/A 03/16/2013    Procedure: COLONOSCOPY;  Surgeon: Rogene Houston, MD;  [facility];  Service: Endoscopy;  Laterality: N/A;  255  . Cholecystectomy N/A 03/31/2013    Procedure: LAPAROSCOPIC CHOLECYSTECTOMY;  Surgeon: Jamesetta So, MD;  Location: [facility];  Service: General;  Laterality: N/A;  . Flexible sigmoidoscopy N/A 08/21/2014    Procedure: FLEXIBLE SIGMOIDOSCOPY;  Surgeon: Rogene Houston, MD;  [facility];  Service: Endoscopy;  Laterality: N/A;  1030  . Colonoscopy N/A 12/31/2014    Procedure: COLONOSCOPY;  Surgeon: Rogene Houston, MD;  [facility];  Service: Endoscopy;  Laterality: N/A;  730    Allergies  Allergen Reactions  . Remicade [Infliximab] Swelling    Joints locked up  . Cefprozil Rash  . Penicillins Rash    Current Outpatient Prescriptions on File Prior to Visit  Medication Sig Dispense Refill  . albuterol (PROVENTIL,VENTOLIN) 90 MCG/ACT inhaler Inhale 2 puffs into the lungs every 6 (six) hours  as needed. Shortness of breath    . cetirizine (ZYRTEC) 10 MG tablet Take 10 mg by mouth every morning.     . citalopram (CELEXA) 20 MG tablet Take 20 mg by mouth every morning.     . dicyclomine (BENTYL) 20 MG tablet Take 1 tablet (20 mg total) by mouth 3 (three) times daily as needed for spasms. 90 tablet 1  . diltiazem (TIAZAC) 360 MG 24 hr capsule Take 360 mg by mouth every morning.     . Fluticasone-Salmeterol (ADVAIR) 250-50 MCG/DOSE AEPB Inhale 1 puff into the lungs every morning.     Marland Kitchen JARDIANCE 25 MG TABS tablet 25 mg daily.     Marland Kitchen lisinopril-hydrochlorothiazide (PRINZIDE,ZESTORETIC) 20-12.5 MG per tablet Take 2 tablets by mouth every morning.     . loperamide (IMODIUM) 2 MG capsule Take by mouth as needed for diarrhea or loose stools.    .  medroxyPROGESTERone (DEPO-PROVERA) 150 MG/ML injection Inject 150 mg into the muscle every 3 (three) months.    . mercaptopurine (PURINETHOL) 50 MG tablet Take 2 tablets (100 mg total) by mouth daily. Give on an empty stomach 1 hour before or 2 hours after meals. Caution: Chemotherapy. 60 tablet 5  . mesalamine (CANASA) 1000 MG suppository Place 1 suppository (1,000 mg total) rectally at bedtime. 30 suppository 2  . mesalamine (PENTASA) 500 MG CR capsule Take 2 capsules (1,000 mg total) by mouth 4 (four) times daily. 240 capsule 11  . Multiple Vitamin (MULITIVITAMIN WITH MINERALS) TABS Take 1 tablet by mouth every morning.     . predniSONE (DELTASONE) 10 MG tablet Take 3 tablets (30 mg total) by mouth daily with breakfast. (Patient taking differently: Take 10 mg by mouth daily with breakfast. ) 100 tablet 0  . spironolactone (ALDACTONE) 25 MG tablet Take 25 mg by mouth every morning.      No current facility-administered medications on file prior to visit.        Objective:   Physical Exam Blood pressure 94/50, pulse 88, temperature 98.9 F (37.2 C), height 5\' 3"  (1.6 m), weight 302 lb 14.4 oz (137.395 kg).  Alert and oriented. Skin warm and dry. Oral mucosa is moist.   . Sclera anicteric, conjunctivae is pink. Thyroid not enlarged. No cervical lymphadenopathy. Lungs clear. Heart regular rate and rhythm.  Abdomen is soft. Bowel sounds are positive. No hepatomegaly. No abdominal masses felt. No tenderness.  No edema to lower extremities.         Assessment & Plan:  Crohn's disease. She is not in remission. She will continue the Prednisone 10mg  till end of month then 5mg .  Canasa supp 1gm at hs x 2 weeks OV in 4 weeks. Continue 6MP and Pentasa. CBC and CRP in 1 week.

## 2015-04-23 ENCOUNTER — Encounter (INDEPENDENT_AMBULATORY_CARE_PROVIDER_SITE_OTHER): Payer: Self-pay | Admitting: *Deleted

## 2015-04-23 ENCOUNTER — Other Ambulatory Visit (INDEPENDENT_AMBULATORY_CARE_PROVIDER_SITE_OTHER): Payer: Self-pay | Admitting: *Deleted

## 2015-04-23 ENCOUNTER — Ambulatory Visit (INDEPENDENT_AMBULATORY_CARE_PROVIDER_SITE_OTHER): Payer: BC Managed Care – PPO | Admitting: Internal Medicine

## 2015-04-23 DIAGNOSIS — K5090 Crohn's disease, unspecified, without complications: Secondary | ICD-10-CM

## 2015-05-07 ENCOUNTER — Ambulatory Visit (INDEPENDENT_AMBULATORY_CARE_PROVIDER_SITE_OTHER): Payer: BC Managed Care – PPO | Admitting: Internal Medicine

## 2015-06-17 ENCOUNTER — Ambulatory Visit (INDEPENDENT_AMBULATORY_CARE_PROVIDER_SITE_OTHER): Payer: BC Managed Care – PPO | Admitting: Internal Medicine

## 2015-06-18 ENCOUNTER — Ambulatory Visit (INDEPENDENT_AMBULATORY_CARE_PROVIDER_SITE_OTHER): Payer: BC Managed Care – PPO | Admitting: Internal Medicine

## 2015-06-18 ENCOUNTER — Encounter (INDEPENDENT_AMBULATORY_CARE_PROVIDER_SITE_OTHER): Payer: Self-pay | Admitting: Internal Medicine

## 2015-06-18 VITALS — BP 116/78 | HR 68 | Temp 98.5°F | Resp 18 | Ht 63.0 in | Wt 285.4 lb

## 2015-06-18 DIAGNOSIS — K5090 Crohn's disease, unspecified, without complications: Secondary | ICD-10-CM | POA: Diagnosis not present

## 2015-06-18 DIAGNOSIS — K589 Irritable bowel syndrome without diarrhea: Secondary | ICD-10-CM | POA: Insufficient documentation

## 2015-06-18 NOTE — Progress Notes (Signed)
Presenting complaint;  Follow-up for Crohn's colitis and left upper quadrant abdominal pain.  Subjective:  [name] is 49 year old Caucasian female who has Crohn's colitis and IBS is here for scheduled visit. She was last seen on 04/08/2015 by Ms. Setzer NP. She had colonoscopy in May this year revealing relapse of her disease and she was begun on 6-MP on 01/04/2015. She was able to taper her prednisone about 7 weeks ago and her symptoms have not relapse. She's been seeing dietitian and went on gluten-free diet about 2 months ago and since then she has felt a lot better. She has noted decrease in abdominal pain and bloating. She has lost 17 pounds since her last visit. She is not aware that any of for family members has celiac disease. She is having 1-2 formed stools daily. She is using dicyclomine on as-needed basis. He continues to experience constant back pain secondary to severe disc disease between L4 and L5 and remains on medical leave. She states she is not having any side effects from 6-MP. Patient tells me that her son who is 57 years old was diagnosed with ankylosing spondylitis.   Current Medications: Outpatient Encounter Prescriptions as of 06/18/2015  Medication Sig  . albuterol (PROVENTIL,VENTOLIN) 90 MCG/ACT inhaler Inhale 2 puffs into the lungs every 6 (six) hours as needed. Shortness of breath  . cetirizine (ZYRTEC) 10 MG tablet Take 10 mg by mouth every morning.   . citalopram (CELEXA) 20 MG tablet Take 20 mg by mouth every morning.   . dicyclomine (BENTYL) 20 MG tablet Take 1 tablet (20 mg total) by mouth 3 (three) times daily as needed for spasms.  Marland Kitchen diltiazem (TIAZAC) 360 MG 24 hr capsule Take 360 mg by mouth every morning.   . Fluticasone-Salmeterol (ADVAIR) 250-50 MCG/DOSE AEPB Inhale 1 puff into the lungs every morning.   Marland Kitchen JARDIANCE 25 MG TABS tablet 25 mg daily.   Marland Kitchen lisinopril-hydrochlorothiazide (PRINZIDE,ZESTORETIC) 20-12.5 MG per tablet Take 2 tablets by mouth every  morning.   . loperamide (IMODIUM) 2 MG capsule Take by mouth as needed for diarrhea or loose stools.  . mercaptopurine (PURINETHOL) 50 MG tablet Take 2 tablets (100 mg total) by mouth daily. Give on an empty stomach 1 hour before or 2 hours after meals. Caution: Chemotherapy.  . mesalamine (PENTASA) 500 MG CR capsule Take 2 capsules (1,000 mg total) by mouth 4 (four) times daily.  . Multiple Vitamin (MULITIVITAMIN WITH MINERALS) TABS Take 1 tablet by mouth every morning.   Marland Kitchen spironolactone (ALDACTONE) 25 MG tablet Take 25 mg by mouth every morning.   . [DISCONTINUED] medroxyPROGESTERone (DEPO-PROVERA) 150 MG/ML injection Inject 150 mg into the muscle every 3 (three) months.  . [DISCONTINUED] mesalamine (CANASA) 1000 MG suppository Place 1 suppository (1,000 mg total) rectally at bedtime. (Patient not taking: Reported on 06/18/2015)  . [DISCONTINUED] predniSONE (DELTASONE) 10 MG tablet Take 3 tablets (30 mg total) by mouth daily with breakfast. (Patient not taking: Reported on 06/18/2015)   No facility-administered encounter medications on file as of 06/18/2015.     Objective: Blood pressure 116/78, pulse 68, temperature 98.5 F (36.9 C), temperature source Oral, resp. rate 18, height 5\' 3"  (1.6 m), weight 285 lb 6.4 oz (129.457 kg). Patient is alert and in no acute distress. Conjunctiva is pink. Sclera is nonicteric Oropharyngeal mucosa is normal. No neck masses or thyromegaly noted. Cardiac exam with regular rhythm normal S1 and S2. No murmur or gallop noted. Lungs are clear to auscultation. Abdomen is obese but soft  with mild tenderness below the left costal margin. No organomegaly or masses. No LE edema or clubbing noted.  Labs/studies Results: Lab data from 04/04/2015  WBC 8.1, H&H 15.3 and 45.6 and platelet count 355K  CRP 0.5   CRP was 1.6 on 12/31/2014    Assessment:  #1. Crohn's colitis. She appears to be back in remission. Patient has been on gluten-free diet since she  has been working with dietitian. I am not aware that celiac disease can mimic Crohn's colitis. #2. Irritable bowel syndrome. Since she has been on gluten-free diet she is having much less bloating and left upper quadrant abdominal pain and diarrhea has resolved. She has never been tested for celiac disease and her family history is negative. #3. Obesity. She has lost 17 pounds since she has been on gluten-free diet.  Plan:  Patient will have CBC with metabolic 7 in 6 weeks. Patient will call if diarrhea and/or bleeding recurs. If her diarrhea relapses will consider screening for celiac disease. Office visit in 6 months.

## 2015-06-18 NOTE — Patient Instructions (Signed)
Call if diarrhea or rectal bleeding relapses. Next CBC with differential and metabolic 7 on 59/13/6859

## 2015-06-21 ENCOUNTER — Other Ambulatory Visit: Payer: Self-pay

## 2015-06-21 DIAGNOSIS — D229 Melanocytic nevi, unspecified: Secondary | ICD-10-CM

## 2015-06-21 HISTORY — DX: Melanocytic nevi, unspecified: D22.9

## 2015-07-07 ENCOUNTER — Other Ambulatory Visit (INDEPENDENT_AMBULATORY_CARE_PROVIDER_SITE_OTHER): Payer: Self-pay | Admitting: Internal Medicine

## 2015-07-24 ENCOUNTER — Other Ambulatory Visit (INDEPENDENT_AMBULATORY_CARE_PROVIDER_SITE_OTHER): Payer: Self-pay | Admitting: *Deleted

## 2015-07-24 ENCOUNTER — Encounter (INDEPENDENT_AMBULATORY_CARE_PROVIDER_SITE_OTHER): Payer: Self-pay | Admitting: *Deleted

## 2015-07-24 DIAGNOSIS — K5090 Crohn's disease, unspecified, without complications: Secondary | ICD-10-CM

## 2015-08-01 ENCOUNTER — Other Ambulatory Visit: Payer: Self-pay

## 2015-08-09 ENCOUNTER — Telehealth (INDEPENDENT_AMBULATORY_CARE_PROVIDER_SITE_OTHER): Payer: Self-pay | Admitting: *Deleted

## 2015-08-09 DIAGNOSIS — K50918 Crohn's disease, unspecified, with other complication: Secondary | ICD-10-CM

## 2015-08-09 LAB — BASIC METABOLIC PANEL
BUN: 13 mg/dL (ref 7–25)
CO2: 29 mmol/L (ref 20–31)
Calcium: 8.8 mg/dL (ref 8.6–10.2)
Chloride: 100 mmol/L (ref 98–110)
Creat: 0.7 mg/dL (ref 0.50–1.10)
Glucose, Bld: 125 mg/dL — ABNORMAL HIGH (ref 65–99)
Potassium: 3.9 mmol/L (ref 3.5–5.3)
Sodium: 139 mmol/L (ref 135–146)

## 2015-08-09 LAB — CBC WITH DIFFERENTIAL/PLATELET
Basophils Absolute: 0 10*3/uL (ref 0.0–0.1)
Basophils Relative: 0 % (ref 0–1)
Eosinophils Absolute: 0.4 10*3/uL (ref 0.0–0.7)
Eosinophils Relative: 4 % (ref 0–5)
HCT: 42.1 % (ref 36.0–46.0)
Hemoglobin: 14 g/dL (ref 12.0–15.0)
Lymphocytes Relative: 18 % (ref 12–46)
Lymphs Abs: 1.7 10*3/uL (ref 0.7–4.0)
MCH: 33.3 pg (ref 26.0–34.0)
MCHC: 33.3 g/dL (ref 30.0–36.0)
MCV: 100.2 fL — ABNORMAL HIGH (ref 78.0–100.0)
MPV: 9.9 fL (ref 8.6–12.4)
Monocytes Absolute: 0.6 10*3/uL (ref 0.1–1.0)
Monocytes Relative: 6 % (ref 3–12)
Neutro Abs: 6.6 10*3/uL (ref 1.7–7.7)
Neutrophils Relative %: 72 % (ref 43–77)
Platelets: 279 10*3/uL (ref 150–400)
RBC: 4.2 MIL/uL (ref 3.87–5.11)
RDW: 13.9 % (ref 11.5–15.5)
WBC: 9.2 10*3/uL (ref 4.0–10.5)

## 2015-08-09 NOTE — Telephone Encounter (Signed)
Per Dr.[name] the patient will need to have labs drawn in 4 months. 

## 2015-11-27 ENCOUNTER — Encounter (INDEPENDENT_AMBULATORY_CARE_PROVIDER_SITE_OTHER): Payer: Self-pay | Admitting: *Deleted

## 2015-11-27 ENCOUNTER — Other Ambulatory Visit (INDEPENDENT_AMBULATORY_CARE_PROVIDER_SITE_OTHER): Payer: Self-pay | Admitting: *Deleted

## 2015-11-27 DIAGNOSIS — K50918 Crohn's disease, unspecified, with other complication: Secondary | ICD-10-CM

## 2015-12-02 ENCOUNTER — Ambulatory Visit (INDEPENDENT_AMBULATORY_CARE_PROVIDER_SITE_OTHER): Payer: BC Managed Care – PPO | Admitting: Internal Medicine

## 2015-12-02 ENCOUNTER — Encounter (INDEPENDENT_AMBULATORY_CARE_PROVIDER_SITE_OTHER): Payer: Self-pay | Admitting: Internal Medicine

## 2015-12-02 ENCOUNTER — Encounter (INDEPENDENT_AMBULATORY_CARE_PROVIDER_SITE_OTHER): Payer: Self-pay | Admitting: *Deleted

## 2015-12-02 VITALS — BP 120/46 | HR 80 | Temp 97.2°F | Ht 63.0 in | Wt 279.0 lb

## 2015-12-02 DIAGNOSIS — R197 Diarrhea, unspecified: Secondary | ICD-10-CM | POA: Diagnosis not present

## 2015-12-02 DIAGNOSIS — K5010 Crohn's disease of large intestine without complications: Secondary | ICD-10-CM

## 2015-12-02 LAB — CBC WITH DIFFERENTIAL/PLATELET
Basophils Absolute: 0 cells/uL (ref 0–200)
Basophils Relative: 0 %
Eosinophils Absolute: 194 cells/uL (ref 15–500)
Eosinophils Relative: 2 %
HCT: 45.3 % — ABNORMAL HIGH (ref 35.0–45.0)
Hemoglobin: 15.2 g/dL (ref 11.7–15.5)
Lymphocytes Relative: 26 %
Lymphs Abs: 2522 cells/uL (ref 850–3900)
MCH: 33.9 pg — ABNORMAL HIGH (ref 27.0–33.0)
MCHC: 33.6 g/dL (ref 32.0–36.0)
MCV: 101.1 fL — ABNORMAL HIGH (ref 80.0–100.0)
MPV: 9.7 fL (ref 7.5–12.5)
Monocytes Absolute: 776 cells/uL (ref 200–950)
Monocytes Relative: 8 %
Neutro Abs: 6208 cells/uL (ref 1500–7800)
Neutrophils Relative %: 64 %
Platelets: 314 10*3/uL (ref 140–400)
RBC: 4.48 MIL/uL (ref 3.80–5.10)
RDW: 12.8 % (ref 11.0–15.0)
WBC: 9.7 10*3/uL (ref 3.8–10.8)

## 2015-12-02 LAB — C-REACTIVE PROTEIN: CRP: 0.8 mg/dL — ABNORMAL HIGH (ref <0.60)

## 2015-12-02 MED ORDER — DICYCLOMINE HCL 20 MG PO TABS: 20.0000 mg | ORAL_TABLET | Freq: Three times a day (TID) | ORAL | Status: DC | PRN

## 2015-12-02 NOTE — Progress Notes (Addendum)
Subjective:    Patient ID: [name], female    DOB: 10/03/1965, 50 y.o.   MRN: [id number]  HPI Here today for f/u of her Crohn's disease. She is maintained on Pentasa and 6MP. She wast last seen in October by Dr. Laural Golden.  She presents today with c/o cramps and diarrhea off and on for 3 months. She says she cannot work because of the diarrhea. She has some nausea. The last two weeks, she says the diarrhea has been constant. She is having 4-6 stools a days. Some of the BMs are soft and some are diarrhea. There is no blood. She has not been taking the Bentyl.  Appetite is good She has lost 6 pounds which was intentional.  She says it feels like a Crohn's flare.     CBC    Component Value Date/Time   WBC 9.2 08/08/2015 1515   RBC 4.20 08/08/2015 1515   HGB 14.0 08/08/2015 1515   HCT 42.1 08/08/2015 1515   PLT 279 08/08/2015 1515   MCV 100.2* 08/08/2015 1515   MCH 33.3 08/08/2015 1515   MCHC 33.3 08/08/2015 1515   RDW 13.9 08/08/2015 1515   LYMPHSABS 1.7 08/08/2015 1515   MONOABS 0.6 08/08/2015 1515   EOSABS 0.4 08/08/2015 1515   BASOSABS 0.0 08/08/2015 1515       12/31/2014   Colonoscopy  Indications: Patient is 50 year old Caucasian female who has history of Crohn's colitis who previously has been documented to be in remission now maintained on Pentasa presents with worsening diarrhea and rectal bleeding. Patient is back on prednisone. She is undergoing colonoscopy primarily to document relapse of disease and also rule out CMV colitis Impression:   Examination performed to cecum. Few small hyperplastic appearing polyps at transverse colon and these were left alone. Patchy colitis involving cecum and mid transverse colon.   Patchy colitis involving distal sigmoid colon. Diffuse rectal involvement with edema erythema erosions and friable mucosa. Biopsies taken from cecum and transverse colon, sigmoid colon and rectum. Biopsy: Chronic active colitis. No viral  cytopathic effect. No dysplasia or malignancy.   Review of Systems Past Medical History  Diagnosis Date  . Crohn disease ([place])   . High cholesterol   . Diabetes mellitus     diet controlled  . Hypertension     pcp  Dr Coralyn Mark Quillian Quince    [place]  . Asthma   . Anxiety   . Sleep apnea 03-2009 study    pt forgot to mention at pre admit visit.  uses c-pap brought her own  machine from home.   . Arthritis     Past Surgical History  Procedure Laterality Date  . Tonsillectomy    . Cyst removed from left hand    . Total hip arthroplasty  04/18/2012    Procedure: TOTAL HIP ARTHROPLASTY;  Surgeon: Marybelle Killings, MD;  Location: [facility];  Service: Orthopedics;  Laterality: Right;  Right Total Hip Arthroplasty  . Colonoscopy N/A 03/16/2013    Procedure: COLONOSCOPY;  Surgeon: Rogene Houston, MD;  [facility];  Service: Endoscopy;  Laterality: N/A;  255  . Cholecystectomy N/A 03/31/2013    Procedure: LAPAROSCOPIC CHOLECYSTECTOMY;  Surgeon: Jamesetta So, MD;  Location: [facility];  Service: General;  Laterality: N/A;  . Flexible sigmoidoscopy N/A 08/21/2014    Procedure: FLEXIBLE SIGMOIDOSCOPY;  Surgeon: Rogene Houston, MD;  [facility];  Service: Endoscopy;  Laterality: N/A;  1030  . Colonoscopy N/A 12/31/2014  Procedure: COLONOSCOPY;  Surgeon: Rogene Houston, MD;  [facility];  Service: Endoscopy;  Laterality: N/A;  730    Allergies  Allergen Reactions  . Remicade [Infliximab] Swelling    Joints locked up  . Cefprozil Rash  . Penicillins Rash    Current Outpatient Prescriptions on File Prior to Visit  Medication Sig Dispense Refill  . albuterol (PROVENTIL,VENTOLIN) 90 MCG/ACT inhaler Inhale 2 puffs into the lungs every 6 (six) hours as needed. Shortness of breath    . cetirizine (ZYRTEC) 10 MG tablet Take 10 mg by mouth every morning.     . citalopram (CELEXA) 20 MG tablet Take 20 mg by mouth every morning.     . diltiazem (TIAZAC) 360 MG 24 hr capsule Take 360  mg by mouth every morning.     . Fluticasone-Salmeterol (ADVAIR) 250-50 MCG/DOSE AEPB Inhale 1 puff into the lungs every morning.     Marland Kitchen JARDIANCE 25 MG TABS tablet 25 mg daily.     Marland Kitchen lisinopril-hydrochlorothiazide (PRINZIDE,ZESTORETIC) 20-12.5 MG per tablet Take 2 tablets by mouth every morning.     . loperamide (IMODIUM) 2 MG capsule Take by mouth as needed for diarrhea or loose stools.    . mercaptopurine (PURINETHOL) 50 MG tablet TAKE 2 TABLETS BY MOUTH EVERY DAY. GIVE ON AN EMPTY STOMACH 1 HOUR BEFORE OR 2 HOURS AFTER MEALS. CAUTION : CHEMOTHERAPY 60 tablet 5  . mesalamine (PENTASA) 500 MG CR capsule Take 2 capsules (1,000 mg total) by mouth 4 (four) times daily. 240 capsule 11  . Multiple Vitamin (MULITIVITAMIN WITH MINERALS) TABS Take 1 tablet by mouth every morning.     Marland Kitchen spironolactone (ALDACTONE) 25 MG tablet Take 25 mg by mouth every morning.      No current facility-administered medications on file prior to visit.        Objective:   Physical Exam Blood pressure 120/46, pulse 80, temperature 97.2 F (36.2 C), height 5\' 3"  (1.6 m), weight 279 lb (126.554 kg). Alert and oriented. Skin warm and dry. Oral mucosa is moist.   . Sclera anicteric, conjunctivae is pink. Thyroid not enlarged. No cervical lymphadenopathy. Lungs clear. Heart regular rate and rhythm.  Abdomen is soft. Bowel sounds are positive. No hepatomegaly. No abdominal masses felt.Tenderness across abdomen and left lower abdomen.   No edema to lower extremities. Patient is alert and oriented.        Assessment & Plan:    #1. Crohn's colitis. Diarrhea, Possible Flare.  Am going to get a CBC and sedrate. Rx for Dicyclomine BID as needed.  Further recommendations to follow.  OV in 6 months.

## 2015-12-02 NOTE — Patient Instructions (Signed)
CBC, Sedrate. Dicyclomine 20mg  BID.  OV in 6 months.

## 2015-12-04 ENCOUNTER — Telehealth (INDEPENDENT_AMBULATORY_CARE_PROVIDER_SITE_OTHER): Payer: Self-pay | Admitting: *Deleted

## 2015-12-04 NOTE — Telephone Encounter (Signed)
Patient called wanting lab results.  Home - [phone] Cell - [place]

## 2015-12-04 NOTE — Telephone Encounter (Signed)
Results given to patient. Continue the Bentyl. Call me end of week

## 2015-12-06 ENCOUNTER — Other Ambulatory Visit (INDEPENDENT_AMBULATORY_CARE_PROVIDER_SITE_OTHER): Payer: Self-pay | Admitting: Internal Medicine

## 2015-12-20 ENCOUNTER — Other Ambulatory Visit (INDEPENDENT_AMBULATORY_CARE_PROVIDER_SITE_OTHER): Payer: Self-pay | Admitting: Internal Medicine

## 2015-12-31 ENCOUNTER — Ambulatory Visit (INDEPENDENT_AMBULATORY_CARE_PROVIDER_SITE_OTHER): Payer: BC Managed Care – PPO | Admitting: Internal Medicine

## 2016-05-25 ENCOUNTER — Other Ambulatory Visit (INDEPENDENT_AMBULATORY_CARE_PROVIDER_SITE_OTHER): Payer: Self-pay | Admitting: Internal Medicine

## 2016-06-02 ENCOUNTER — Encounter (INDEPENDENT_AMBULATORY_CARE_PROVIDER_SITE_OTHER): Payer: Self-pay | Admitting: Internal Medicine

## 2016-06-02 ENCOUNTER — Ambulatory Visit (INDEPENDENT_AMBULATORY_CARE_PROVIDER_SITE_OTHER): Payer: BC Managed Care – PPO | Admitting: Internal Medicine

## 2016-06-02 VITALS — BP 120/70 | HR 67 | Temp 98.7°F | Resp 18 | Ht 63.0 in | Wt 273.5 lb

## 2016-06-02 DIAGNOSIS — K580 Irritable bowel syndrome with diarrhea: Secondary | ICD-10-CM

## 2016-06-02 DIAGNOSIS — K5010 Crohn's disease of large intestine without complications: Secondary | ICD-10-CM

## 2016-06-02 DIAGNOSIS — K121 Other forms of stomatitis: Secondary | ICD-10-CM

## 2016-06-02 NOTE — Progress Notes (Signed)
Presenting complaint;  Follow-up for Crohn's colitis. Patient complains of raw areas in her mouth.  Database and Subjective:  [name] is 50 year old Caucasian female who has a 19 year history of Crohn's colitis. She had been on 6-MP for 7 years and remained in remission. She had colonoscopy in July 2014 and was noted to be in remission. She decided to come off 6-MP. Disease could not be controlled with oral mesalamine alone. She had colonoscopy in May last year confirming relapse of her colitis and no evidence of CMV. She was begun on 6-MP. She was last seen in April 2017 and now returns for follow-up visit. She has diarrhea at least 3 days each week. On days when she has diarrhea she has 4-5 stools per day. She also has pain in left upper quadrant for abdomen when she has diarrhea. She feels dicyclomine has helped. She says 50% of her stools are formed 25% semi-formed and 25% or loose. This is in spite of the fact that she is taking Imodium 1-2 tablets per day. She states she uses around 35 tablets per month. She has not experienced rectal bleeding recently. She also has not had any accidents. She has very good appetite but she is watching gluten intake as well as calorie intake. She exercises using elliptical machine 3 times a week and for at least 20 minutes each time. She has managed to lose 6 pounds in the last 6 months. Her new complaint is one of raw areas in her mouth. This has been going on for 4 months. She was given Magic mouthwash by Dr. Gar Ponto but it did not make any difference. She states she had blood work by Dr. Gar Ponto about a month ago. She states she lost her dad secondary to Parkinson's disease.   Current Medications: Outpatient Encounter Prescriptions as of 06/02/2016  Medication Sig  . albuterol (PROVENTIL,VENTOLIN) 90 MCG/ACT inhaler Inhale 2 puffs into the lungs every 6 (six) hours as needed. Shortness of breath  . CAMRESE 0.15-0.03 &0.01 MG tablet Take 1 tablet  by mouth daily.   . cetirizine (ZYRTEC) 10 MG tablet Take 10 mg by mouth every morning.   . citalopram (CELEXA) 20 MG tablet Take 20 mg by mouth every morning.   . dicyclomine (BENTYL) 20 MG tablet TAKE ONE TABLET BY MOUTH THREE TIMES DAILY AS NEEDED FOR SPASMS  . diltiazem (TIAZAC) 360 MG 24 hr capsule Take 360 mg by mouth every morning.   . Fluticasone-Salmeterol (ADVAIR) 250-50 MCG/DOSE AEPB Inhale 1 puff into the lungs every morning.   Marland Kitchen JARDIANCE 25 MG TABS tablet 25 mg daily.   Marland Kitchen lisinopril-hydrochlorothiazide (PRINZIDE,ZESTORETIC) 20-12.5 MG per tablet Take 2 tablets by mouth every morning.   . loperamide (IMODIUM) 2 MG capsule Take by mouth as needed for diarrhea or loose stools.  . mercaptopurine (PURINETHOL) 50 MG tablet TAKE TWO (2) TABLETS BY MOUTH EVERY DAY. GIVE ON EMPTY STOMACH ONE HOUR BEFORE OR TWO HOURS AFTER MEALS. CAUTION CHEMOTHERAPY  . Multiple Vitamin (MULITIVITAMIN WITH MINERALS) TABS Take 1 tablet by mouth every morning.   Marland Kitchen PENTASA 500 MG CR capsule TAKE TWO (2) CAPSULES BY MOUTH FOUR TIMES DAILY  . rosuvastatin (CRESTOR) 5 MG tablet Take 5 mg by mouth every morning.   Marland Kitchen spironolactone (ALDACTONE) 25 MG tablet Take 25 mg by mouth every morning.    No facility-administered encounter medications on file as of 06/02/2016.      Objective: Blood pressure 120/70, pulse 67, temperature 98.7 F (37.1 C),  temperature source Oral, resp. rate 18, height 5\' 3"  (1.6 m), weight 273 lb 8 oz (124.1 kg). Patient is alert and in no acute distress. Conjunctiva is pink. Sclera is nonicteric Oropharyngeal exam reveals buccal mucosa on the right side with epithelial excoriation and a much smaller area on the left side. No neck masses or thyromegaly noted. Cardiac exam with regular rhythm normal S1 and S2. No murmur or gallop noted. Lungs are clear to auscultation. Abdomen is obese. Bowel sounds are normal. On palpation abdomen is soft with mild tenderness at RUQ.  No LE edema or  clubbing noted.  Labs/studies Results: Lab data from 12/02/2015  CRP 0.8 (normal less than 0.6 )  WBC 9.7, H&H 15.2 and 45.3. MCV 101.1. Platelet count 314K  ANC 6208  Assessment:  #1. Crohn's colitis. She possibly is in remission. Intermittent nonbloody diarrhea most likely due to IBS. Since she is having side effects with 6-MP in the form of stomatitis needs to make sure that she is indeed in remission before dose reduced. Would like to see normal CPR. #2. Irritable bowel syndrome. She has intermittent bloody diarrhea and left upper quadrant pain and seemed to respond to dicyclomine. #3. Stomatitis possibly secondary to 6-MP. If CRP is normal may consider dropping 6-MP dose.   Plan:  Will request copy of recent blood work from Dr. Coralyn Mark Daniel's office. Patient will go to the lab for CRP. Office visit in 6 months.

## 2016-06-02 NOTE — Patient Instructions (Signed)
Physician will call with results of blood test. 

## 2016-06-03 LAB — C-REACTIVE PROTEIN: CRP: 12.9 mg/L — ABNORMAL HIGH (ref <8.0)

## 2016-06-04 ENCOUNTER — Other Ambulatory Visit (INDEPENDENT_AMBULATORY_CARE_PROVIDER_SITE_OTHER): Payer: Self-pay | Admitting: Internal Medicine

## 2016-06-05 ENCOUNTER — Other Ambulatory Visit (INDEPENDENT_AMBULATORY_CARE_PROVIDER_SITE_OTHER): Payer: Self-pay | Admitting: *Deleted

## 2016-06-05 DIAGNOSIS — K50118 Crohn's disease of large intestine with other complication: Secondary | ICD-10-CM

## 2016-06-05 DIAGNOSIS — K580 Irritable bowel syndrome with diarrhea: Secondary | ICD-10-CM

## 2016-07-01 ENCOUNTER — Other Ambulatory Visit (INDEPENDENT_AMBULATORY_CARE_PROVIDER_SITE_OTHER): Payer: Self-pay | Admitting: Internal Medicine

## 2016-07-20 ENCOUNTER — Encounter (INDEPENDENT_AMBULATORY_CARE_PROVIDER_SITE_OTHER): Payer: Self-pay | Admitting: *Deleted

## 2016-07-20 ENCOUNTER — Other Ambulatory Visit (INDEPENDENT_AMBULATORY_CARE_PROVIDER_SITE_OTHER): Payer: Self-pay | Admitting: *Deleted

## 2016-07-20 DIAGNOSIS — K580 Irritable bowel syndrome with diarrhea: Secondary | ICD-10-CM

## 2016-07-20 DIAGNOSIS — K50118 Crohn's disease of large intestine with other complication: Secondary | ICD-10-CM

## 2016-10-01 ENCOUNTER — Ambulatory Visit (INDEPENDENT_AMBULATORY_CARE_PROVIDER_SITE_OTHER): Payer: BC Managed Care – PPO | Admitting: Orthopaedic Surgery

## 2016-10-01 ENCOUNTER — Ambulatory Visit (INDEPENDENT_AMBULATORY_CARE_PROVIDER_SITE_OTHER): Payer: BC Managed Care – PPO

## 2016-10-01 ENCOUNTER — Encounter (INDEPENDENT_AMBULATORY_CARE_PROVIDER_SITE_OTHER): Payer: Self-pay | Admitting: Orthopaedic Surgery

## 2016-10-01 VITALS — BP 106/56 | HR 86 | Ht 63.0 in | Wt 263.0 lb

## 2016-10-01 DIAGNOSIS — M25561 Pain in right knee: Secondary | ICD-10-CM

## 2016-10-01 NOTE — Progress Notes (Signed)
Office Visit Note   Patient: [name]           Date of Birth: 06-26-1966           MRN: [id number] Visit Date: 10/01/2016              Requested by: Caryl Bis, MD [place], [place] [address] PCP: Gar Ponto, MD   Assessment & Plan: Visit Diagnoses:  1. Acute pain of right knee            Chondromalacia with medial joint line and tenderness and medial joint line narrowing.  Plan: She's got some improvement with ibuprofen. She'll take at therapeutic dosages for a week with food. She's having increasing problems or catching she will let us know we can consider an injection. Today the injection was deferred since she is a diabetic and has been taking Jardiance.   Follow-Up Instructions: Return in about 1 week (around 10/08/2016).   Orders:  Orders Placed This Encounter  Procedures  . XR KNEE 3 VIEW RIGHT   No orders of the defined types were placed in this encounter.     Procedures: No procedures performed   Clinical Data: No additional findings.   Subjective: Chief Complaint  Patient presents with  . Right Knee - Pain    Patient presents with right knee pain and occasional popping since 09/29/2016. She states that her knee began hurting after stepping out of a van. She has used ice and advil. She does state that she is a little better now.     Review of Systems  Constitutional: Negative for chills and diaphoresis.  HENT: Negative for ear discharge, ear pain and nosebleeds.   Eyes: Negative for discharge and visual disturbance.  Respiratory: Negative for cough, choking and shortness of breath.   Cardiovascular: Negative for chest pain and palpitations.  Gastrointestinal: Negative for abdominal distention and abdominal pain.  Endocrine: Negative for cold intolerance and heat intolerance.       Patient is a diabetic  Genitourinary: Negative for flank pain and hematuria.  Musculoskeletal:       Previous right total hip arthroplasty Dr. Lorin Mercy  posterior approach 4 years ago. She's been more active and has lost 40 pounds.  Skin: Negative for rash and wound.  Neurological: Negative for seizures and speech difficulty.  Hematological: Negative for adenopathy. Does not bruise/bleed easily.  Psychiatric/Behavioral: Negative for agitation and suicidal ideas.     Objective: Vital Signs: BP (!) 106/56   Pulse 86   Ht 5\' 3"  (1.6 m)   Wt 263 lb (119.3 kg)   BMI 46.59 kg/m   Physical Exam  Constitutional: She is oriented to person, place, and time. She appears well-developed.  HENT:  Head: Normocephalic.  Right Ear: External ear normal.  Left Ear: External ear normal.  Eyes: Pupils are equal, round, and reactive to light.  Neck: No tracheal deviation present. No thyromegaly present.  Cardiovascular: Normal rate.   Pulmonary/Chest: Effort normal.  Abdominal: Soft.  Musculoskeletal:  Patient is some crepitus with knee range of motion. Pain with hyperextension at the medial joint line medial joint line tenderness collateral ligaments cruciate ligament exam is normal. Mild patellofemoral crepitance no subluxation of the patella quad and patellar tendon are normal tib-fib joint is normal no pain with hip range of motion. Well-healed right posterior hip incision. Normal Symmetry negative [place].  Neurological: She is alert and oriented to person, place, and time.  Skin: Skin is warm and  dry.  Psychiatric: She has a normal mood and affect. Her behavior is normal.    Ortho Exam  Specialty Comments:  No specialty comments available.  Imaging: Xr Knee 3 View Right  Result Date: 10/01/2016 Three-view x-rays right knee obtained and reviewed. Patient has the medial patellofemoral joint space narrowing. Medial marginal osteophytes more than lateral. No acute fracture. Joint space narrowing more the medial compartment. Assessment: Knee osteoarthritic changes no acute fracture.    PMFS History: Patient Active Problem List   Diagnosis  Date Noted  . IBS (irritable bowel syndrome) 06/18/2015  . Obesity 10/08/2014  . Osteoarthritis of right hip 04/20/2012  . Hypertension 10/08/2011  . High cholesterol 10/08/2011  . Diabetes in pregnancy 10/08/2011  . Crohn disease ([place]) 10/08/2011   Past Medical History:  Diagnosis Date  . Anxiety   . Arthritis   . Asthma   . Crohn disease ([place])   . Diabetes mellitus    diet controlled  . High cholesterol   . Hypertension    pcp  Dr Coralyn Mark Quillian Quince    [place]  . Sleep apnea 03-2009 study   pt forgot to mention at pre admit visit.  uses c-pap brought her own  machine from home.     No family history on file.  Past Surgical History:  Procedure Laterality Date  . CHOLECYSTECTOMY N/A 03/31/2013   Procedure: LAPAROSCOPIC CHOLECYSTECTOMY;  Surgeon: Jamesetta So, MD;  Location: [facility];  Service: General;  Laterality: N/A;  . COLONOSCOPY N/A 03/16/2013   Procedure: COLONOSCOPY;  Surgeon: Rogene Houston, MD;  [facility];  Service: Endoscopy;  Laterality: N/A;  255  . COLONOSCOPY N/A 12/31/2014   Procedure: COLONOSCOPY;  Surgeon: Rogene Houston, MD;  [facility];  Service: Endoscopy;  Laterality: N/A;  730  . cyst removed from left hand    . FLEXIBLE SIGMOIDOSCOPY N/A 08/21/2014   Procedure: FLEXIBLE SIGMOIDOSCOPY;  Surgeon: Rogene Houston, MD;  [facility];  Service: Endoscopy;  Laterality: N/A;  1030  . TONSILLECTOMY    . TOTAL HIP ARTHROPLASTY  04/18/2012   Procedure: TOTAL HIP ARTHROPLASTY;  Surgeon: Marybelle Killings, MD;  Location: [facility];  Service: Orthopedics;  Laterality: Right;  Right Total Hip Arthroplasty   Social History   Occupational History  . Not on file.   Social History Main Topics  . Smoking status: Never Smoker  . Smokeless tobacco: Never Used  . Alcohol use No  . Drug use: No  . Sexual activity: Yes    Birth control/ protection: Pill

## 2016-10-08 ENCOUNTER — Encounter (INDEPENDENT_AMBULATORY_CARE_PROVIDER_SITE_OTHER): Payer: Self-pay | Admitting: Orthopaedic Surgery

## 2016-10-08 ENCOUNTER — Ambulatory Visit (INDEPENDENT_AMBULATORY_CARE_PROVIDER_SITE_OTHER): Payer: BC Managed Care – PPO | Admitting: Orthopaedic Surgery

## 2016-10-08 VITALS — BP 113/64 | HR 86 | Ht 63.0 in | Wt 261.0 lb

## 2016-10-08 DIAGNOSIS — M2391 Unspecified internal derangement of right knee: Secondary | ICD-10-CM

## 2016-10-08 NOTE — Progress Notes (Signed)
Office Visit Note   Patient: [name]           Date of Birth: 05/25/1966           MRN: [id number] Visit Date: 10/08/2016              Requested by: Caryl Bis, MD [place], [place] [address] PCP: Gar Ponto, MD   Assessment & Plan: Visit Diagnoses:  1. Knee locking, right     Plan: Patient's had episodes of knee locking difficulty ambulating. She was not able move her knee in 7 sharp medial joint line pain. Over the last 4 days this is gotten somewhat better but she still having difficulty ambulating. We'll proceed with an MRI scan to rule out the knee derangement with either medial meniscal tear or chondral fragment with locking. Previous knee x-rays demonstrates marginal osteophyte and joint space narrowing.  Follow-Up Instructions: follow up after MRI right knee. Work slip given for no work pending review of MRI in 2 weeks. We previously discussed possible intra-articular injection were reviewed she has a locking that continues with the either meniscal fragment or chondral flap tear that's catching in her knee then knee arthroscopy may be needed and intra-articular injection can increase her risk for infection if she needs to have knee arthroscopy.  Orders:  No orders of the defined types were placed in this encounter.  No orders of the defined types were placed in this encounter.     Procedures: No procedures performed   Clinical Data: No additional findings.   Subjective: Chief Complaint  Patient presents with  . Right Knee - Pain, Follow-up    Patient returns for one week recheck on right knee. She states that yesterday she could hardly walk on it.  She was getting out of the shower this past Friday and her knee gave way. She has had increased pain ever since. She is using ice, advil, and a wrap around knee sleeve with no relief.  Patient had use a cane due to instability of her knee with sharp pain and locking medial joint line pain this  occurred the day after I seen her for exam.  Review of Systems 14 point review of systems updated she does have history of diabetes she is on  Jardiance with the all her sugars below 125. All other systems are unchanged.   Objective: Vital Signs: BP 113/64   Pulse 86   Ht 5\' 3"  (1.6 m)   Wt 261 lb (118.4 kg)   BMI 46.23 kg/m   Physical Exam  Constitutional: She is oriented to person, place, and time. She appears well-developed.  HENT:  Head: Normocephalic.  Right Ear: External ear normal.  Left Ear: External ear normal.  Eyes: Pupils are equal, round, and reactive to light.  Neck: No tracheal deviation present. No thyromegaly present.  Cardiovascular: Normal rate.   Pulmonary/Chest: Effort normal.  Abdominal: Soft.  Musculoskeletal:  Patient has 10 lack extension she flexes 110. Pain with hip range of motion no rash or expose skin she has exquisite medial joint line tenderness collateral ligaments are stable. She has more tenderness anterior than posterior to the MCL. Anterior cruciate ligament PCL testing is normal she has mild knee effusion. Ankle range of motion is normal sensation to foot and ankle is normal.  Neurological: She is alert and oriented to person, place, and time.  Skin: Skin is warm and dry.  Psychiatric: She has a normal mood and affect.  Her behavior is normal.    Ortho Exam positive hyperextension test positive McMurray test. Negative Lachman. All right knee. Left knee has full extension good flexion.  Specialty Comments:  No specialty comments available.  Imaging: No results found.   PMFS History: Patient Active Problem List   Diagnosis Date Noted  . IBS (irritable bowel syndrome) 06/18/2015  . Obesity 10/08/2014  . Osteoarthritis of right hip 04/20/2012  . Hypertension 10/08/2011  . High cholesterol 10/08/2011  . Diabetes in pregnancy 10/08/2011  . Crohn disease ([place]) 10/08/2011   Past Medical History:  Diagnosis Date  . Anxiety   .  Arthritis   . Asthma   . Crohn disease ([place])   . Diabetes mellitus    diet controlled  . High cholesterol   . Hypertension    pcp  Dr Coralyn Mark Quillian Quince    [place]  . Sleep apnea 03-2009 study   pt forgot to mention at pre admit visit.  uses c-pap brought her own  machine from home.     No family history on file.  Past Surgical History:  Procedure Laterality Date  . CHOLECYSTECTOMY N/A 03/31/2013   Procedure: LAPAROSCOPIC CHOLECYSTECTOMY;  Surgeon: Jamesetta So, MD;  [facility]: [facility];  Service: General;  Laterality: N/A;  . COLONOSCOPY N/A 03/16/2013   Procedure: COLONOSCOPY;  Surgeon: Rogene Houston, MD;  [facility];  Service: Endoscopy;  Laterality: N/A;  255  . COLONOSCOPY N/A 12/31/2014   Procedure: COLONOSCOPY;  Surgeon: Rogene Houston, MD;  [facility];  Service: Endoscopy;  Laterality: N/A;  730  . cyst removed from left hand    . FLEXIBLE SIGMOIDOSCOPY N/A 08/21/2014   Procedure: FLEXIBLE SIGMOIDOSCOPY;  Surgeon: Rogene Houston, MD;  [facility];  Service: Endoscopy;  Laterality: N/A;  1030  . TONSILLECTOMY    . TOTAL HIP ARTHROPLASTY  04/18/2012   Procedure: TOTAL HIP ARTHROPLASTY;  Surgeon: Marybelle Killings, MD;  Location: [facility];  Service: Orthopedics;  Laterality: Right;  Right Total Hip Arthroplasty   Social History   Occupational History  . Not on file.   Social History Main Topics  . Smoking status: Never Smoker  . Smokeless tobacco: Never Used  . Alcohol use No  . Drug use: No  . Sexual activity: Yes    Birth control/ protection: Pill

## 2016-10-08 NOTE — Addendum Note (Signed)
Addended by: Meyer Cory on: 10/08/2016 12:33 PM   Modules accepted: Orders

## 2016-10-22 ENCOUNTER — Encounter (INDEPENDENT_AMBULATORY_CARE_PROVIDER_SITE_OTHER): Payer: Self-pay | Admitting: Orthopaedic Surgery

## 2016-10-22 ENCOUNTER — Ambulatory Visit (INDEPENDENT_AMBULATORY_CARE_PROVIDER_SITE_OTHER): Payer: BC Managed Care – PPO | Admitting: Orthopaedic Surgery

## 2016-10-22 VITALS — BP 114/61 | HR 86 | Ht 63.0 in | Wt 261.0 lb

## 2016-10-22 DIAGNOSIS — M1711 Unilateral primary osteoarthritis, right knee: Secondary | ICD-10-CM

## 2016-10-22 NOTE — Progress Notes (Signed)
Office Visit Note   Patient: [name]           Date of Birth: Dec 19, 1965           MRN: [id number] Visit Date: 10/22/2016              Requested by: Caryl Bis, MD [place], [place] [address] PCP: Gar Ponto, MD   Assessment & Plan: Visit Diagnoses:  1. Unilateral primary osteoarthritis, right knee     Plan: She'll continue on her diet with a goal of getting her BMI below 40. Occasional anti-inflammatory with care due to her Crohn's disease. Her knee is actually better at this point we will defer intra-articular cortisone injection. We discussed the swimming, riding and excised bike. We discussed workout activities that are likely to bother her knee that she should avoid. I'll recheck her again in 4 months.  Follow-Up Instructions: Return in about 4 months (around 02/19/2017).   Orders:  No orders of the defined types were placed in this encounter.  No orders of the defined types were placed in this encounter.     Procedures: No procedures performed   Clinical Data: No additional findings.   Subjective: Chief Complaint  Patient presents with  . Right Knee - Follow-up, Pain    Patient returns to review MRI Right Knee. She states that she is better than last visit, but does have a constant ache in her knee. She denies taking anything for pain.   Patient lost 55 pounds following Weight Watchers diet and going gluten-free. Knee is better since last visit. She is here for MRI review. He occasionally uses anti-inflammatories for this to be careful due to her Crohn's disease flaring.  Review of Systems 14 point review of systems updated and is unchanged from office visit on 10/08/2016 other than as listed above.   Objective: Vital Signs: BP 114/61   Pulse 86   Ht 5\' 3"  (1.6 m)   Wt 261 lb (118.4 kg)   BMI 46.23 kg/m   Physical Exam  Constitutional: She is oriented to person, place, and time. She appears well-developed.  HENT:  Head:  Normocephalic.  Right Ear: External ear normal.  Left Ear: External ear normal.  Eyes: Pupils are equal, round, and reactive to light.  Neck: No tracheal deviation present. No thyromegaly present.  Cardiovascular: Normal rate.   Pulmonary/Chest: Effort normal.  Abdominal: Soft.  Musculoskeletal:  Patient has medial joint line tenderness. Small palpable osteophytes. Negative anterior drawer she is immature without limp today. There is only trace effusion on the right knee none on the left knee. Lateral joint line tenderness no pitting edema negative Homan a rash over exposed skin distal pulses are intact negative straight leg raising 90.  Neurological: She is alert and oriented to person, place, and time.  Skin: Skin is warm and dry.  Psychiatric: She has a normal mood and affect. Her behavior is normal.    Ortho Exam  Specialty Comments:  No specialty comments available.  Imaging: No results found.   PMFS History: Patient Active Problem List   Diagnosis Date Noted  . IBS (irritable bowel syndrome) 06/18/2015  . Obesity 10/08/2014  . Osteoarthritis of right hip 04/20/2012  . Hypertension 10/08/2011  . High cholesterol 10/08/2011  . Diabetes in pregnancy 10/08/2011  . Crohn disease ([place]) 10/08/2011   Past Medical History:  Diagnosis Date  . Anxiety   . Arthritis   . Asthma   . Crohn disease (  [place])   . Diabetes mellitus    diet controlled  . High cholesterol   . Hypertension    pcp  Dr Coralyn Mark Quillian Quince    [place]  . Sleep apnea 03-2009 study   pt forgot to mention at pre admit visit.  uses c-pap brought her own  machine from home.     No family history on file.  Past Surgical History:  Procedure Laterality Date  . CHOLECYSTECTOMY N/A 03/31/2013   Procedure: LAPAROSCOPIC CHOLECYSTECTOMY;  Surgeon: Jamesetta So, MD;  [facility];  Service: General;  Laterality: N/A;  . COLONOSCOPY N/A 03/16/2013   Procedure: COLONOSCOPY;  Surgeon: Rogene Houston, MD;  [facility];  Service: Endoscopy;  Laterality: N/A;  255  . COLONOSCOPY N/A 12/31/2014   Procedure: COLONOSCOPY;  Surgeon: Rogene Houston, MD;  [facility];  Service: Endoscopy;  Laterality: N/A;  730  . cyst removed from left hand    . FLEXIBLE SIGMOIDOSCOPY N/A 08/21/2014   Procedure: FLEXIBLE SIGMOIDOSCOPY;  Surgeon: Rogene Houston, MD;  [facility];  Service: Endoscopy;  Laterality: N/A;  1030  . TONSILLECTOMY    . TOTAL HIP ARTHROPLASTY  04/18/2012   Procedure: TOTAL HIP ARTHROPLASTY;  Surgeon: Marybelle Killings, MD;  Location: [facility];  Service: Orthopedics;  Laterality: Right;  Right Total Hip Arthroplasty   Social History   Occupational History  . Not on file.   Social History Main Topics  . Smoking status: Never Smoker  . Smokeless tobacco: Never Used  . Alcohol use No  . Drug use: No  . Sexual activity: Yes    Birth control/ protection: Pill

## 2016-12-01 ENCOUNTER — Encounter (INDEPENDENT_AMBULATORY_CARE_PROVIDER_SITE_OTHER): Payer: Self-pay | Admitting: Internal Medicine

## 2016-12-01 ENCOUNTER — Ambulatory Visit (INDEPENDENT_AMBULATORY_CARE_PROVIDER_SITE_OTHER): Payer: BC Managed Care – PPO | Admitting: Internal Medicine

## 2016-12-01 ENCOUNTER — Encounter (INDEPENDENT_AMBULATORY_CARE_PROVIDER_SITE_OTHER): Payer: Self-pay

## 2016-12-01 VITALS — BP 118/72 | HR 74 | Temp 98.4°F | Resp 18 | Ht 63.0 in | Wt 258.7 lb

## 2016-12-01 DIAGNOSIS — K5010 Crohn's disease of large intestine without complications: Secondary | ICD-10-CM

## 2016-12-01 DIAGNOSIS — K580 Irritable bowel syndrome with diarrhea: Secondary | ICD-10-CM | POA: Diagnosis not present

## 2016-12-01 LAB — CBC WITH DIFFERENTIAL/PLATELET
Basophils Absolute: 0 cells/uL (ref 0–200)
Basophils Relative: 0 %
Eosinophils Absolute: 75 cells/uL (ref 15–500)
Eosinophils Relative: 1 %
HCT: 44.7 % (ref 35.0–45.0)
Hemoglobin: 14.6 g/dL (ref 11.7–15.5)
Lymphocytes Relative: 21 %
Lymphs Abs: 1575 cells/uL (ref 850–3900)
MCH: 33 pg (ref 27.0–33.0)
MCHC: 32.7 g/dL (ref 32.0–36.0)
MCV: 101.1 fL — ABNORMAL HIGH (ref 80.0–100.0)
MPV: 10 fL (ref 7.5–12.5)
Monocytes Absolute: 525 cells/uL (ref 200–950)
Monocytes Relative: 7 %
Neutro Abs: 5325 cells/uL (ref 1500–7800)
Neutrophils Relative %: 71 %
Platelets: 297 10*3/uL (ref 140–400)
RBC: 4.42 MIL/uL (ref 3.80–5.10)
RDW: 12.7 % (ref 11.0–15.0)
WBC: 7.5 10*3/uL (ref 3.8–10.8)

## 2016-12-01 LAB — HEPATIC FUNCTION PANEL
ALT: 15 U/L (ref 6–29)
AST: 15 U/L (ref 10–35)
Albumin: 3.8 g/dL (ref 3.6–5.1)
Alkaline Phosphatase: 51 U/L (ref 33–130)
Bilirubin, Direct: 0.1 mg/dL (ref <=0.2)
Indirect Bilirubin: 0.4 mg/dL (ref 0.2–1.2)
Total Bilirubin: 0.5 mg/dL (ref 0.2–1.2)
Total Protein: 6.8 g/dL (ref 6.1–8.1)

## 2016-12-01 MED ORDER — MERCAPTOPURINE 50 MG PO TABS: 75.0000 mg | ORAL_TABLET | Freq: Every day | ORAL | 11 refills | Status: DC

## 2016-12-01 NOTE — Progress Notes (Signed)
Presenting complaint;  Follow-up for Crohn's colitis.  Subjective:  [name] is 51 year old Caucasian female who is here for scheduled visit. She was last seen 6 months ago. She was having symptoms of stomatitis and 6-MP dose was decreased to 75 mg with relief of her symptoms. She decided to retire which was 2 months ago. She continues to feel better. She has anywhere from 1-4 bowel movements per day. Her average is around 1.5 bowel movements per day. Most of her stools are formed. However when she is stressed she has diarrhea and antecubital abdominal pain. He uses dicyclomine mainly at night. She does not even remember the last time she noted blood with her bowel movements. She exercises at least 3 times a week. She has lost 15 pounds since her last visit. She states she had blood work by Dr. Gar Ponto about 4 months ago. She lost her father because of Parkinson's disease past July. He was in his 68s.   Current Medications: Outpatient Encounter Prescriptions as of 12/01/2016  Medication Sig  . albuterol (PROVENTIL,VENTOLIN) 90 MCG/ACT inhaler Inhale 2 puffs into the lungs every 6 (six) hours as needed. Shortness of breath  . CAMRESE 0.15-0.03 &0.01 MG tablet Take 1 tablet by mouth daily.   . cetirizine (ZYRTEC) 10 MG tablet Take 10 mg by mouth every morning.   . citalopram (CELEXA) 20 MG tablet Take 20 mg by mouth every morning.   . dicyclomine (BENTYL) 20 MG tablet TAKE ONE TABLET BY MOUTH THREE TIMES DAILY AS NEEDED FOR SPASMS  . diltiazem (TIAZAC) 360 MG 24 hr capsule Take 360 mg by mouth every morning.   . Fluticasone-Salmeterol (ADVAIR) 250-50 MCG/DOSE AEPB Inhale 1 puff into the lungs every morning.   Marland Kitchen JARDIANCE 25 MG TABS tablet 25 mg daily.   Marland Kitchen lisinopril-hydrochlorothiazide (PRINZIDE,ZESTORETIC) 20-12.5 MG per tablet Take 2 tablets by mouth every morning.   . loperamide (IMODIUM) 2 MG capsule Take by mouth as needed for diarrhea or loose stools.  . mercaptopurine (PURINETHOL) 50  MG tablet TAKE TWO (2) TABLETS BY MOUTH EVERY DAY. GIVE ON EMPTY STOMACH ONE HOUR BEFORE OR TWO HOURS AFTER MEALS. CAUTION CHEMOTHERAPY  . Multiple Vitamin (MULITIVITAMIN WITH MINERALS) TABS Take 1 tablet by mouth every morning.   Marland Kitchen PENTASA 500 MG CR capsule TAKE TWO (2) CAPSULES BY MOUTH FOUR TIMES DAILY  . rosuvastatin (CRESTOR) 5 MG tablet Take 5 mg by mouth every morning.   Marland Kitchen spironolactone (ALDACTONE) 25 MG tablet Take 25 mg by mouth every morning.   . [DISCONTINUED] mercaptopurine (PURINETHOL) 50 MG tablet Take 1.5 tablets (75 mg total) by mouth daily. Give on an empty stomach 1 hour before or 2 hours after meals. Caution: Chemotherapy.   No facility-administered encounter medications on file as of 12/01/2016.      Objective: Blood pressure 118/72, pulse 74, temperature 98.4 F (36.9 C), temperature source Oral, resp. rate 18, height 5\' 3"  (1.6 m), weight 258 lb 11.2 oz (117.3 kg). Patient is alert and in no acute distress. Conjunctiva is pink. Sclera is nonicteric Oropharyngeal mucosa is normal. No neck masses or thyromegaly noted. Cardiac exam with regular rhythm normal S1 and S2. Faint systolic ejection murmur noted at upper left sternal border. Lungs are clear to auscultation. Abdomen is obese. On palpation is soft and nontender without organomegaly or masses. No LE edema or clubbing noted.  Labs/studies Results:   C-reactive protein on 06/02/2016 was 12.9(normal up to 8).  Assessment:  #1. Crohn's colitis. Last colonoscopy was in May  2016 revealing mildly active disease. Clinically she appears to be in remission. Last CRP was 1-1/2 times normal. #2. IBS. She has intermittent nonbloody diarrhea when she is stressed out and she also has LUQ abdominal pain. Prior workup of this clean was negative. #3. Patient has new finding of faint systolic murmur. This is possibly flow murmur. Further workup per Dr. Gar Ponto.  Plan:  Patient will go to lab for CBC with differential,  LFTs and CRP. She will continue 6-MP and oral mesalamine at current dose. Patient will follow with Dr. Gar Ponto regarding finding of a new heart murmur. Office visit in 6 months.

## 2016-12-01 NOTE — Patient Instructions (Signed)
Physician will call with results of blood tests when completed. Follow-up with Dr. Gar Ponto regarding heart murmur(possibly flow murmur)

## 2016-12-02 ENCOUNTER — Other Ambulatory Visit (INDEPENDENT_AMBULATORY_CARE_PROVIDER_SITE_OTHER): Payer: Self-pay | Admitting: *Deleted

## 2016-12-02 DIAGNOSIS — K5000 Crohn's disease of small intestine without complications: Secondary | ICD-10-CM

## 2016-12-02 LAB — C-REACTIVE PROTEIN: CRP: 10.7 mg/L — ABNORMAL HIGH (ref <8.0)

## 2016-12-03 ENCOUNTER — Other Ambulatory Visit (INDEPENDENT_AMBULATORY_CARE_PROVIDER_SITE_OTHER): Payer: Self-pay | Admitting: Internal Medicine

## 2017-02-25 ENCOUNTER — Ambulatory Visit (INDEPENDENT_AMBULATORY_CARE_PROVIDER_SITE_OTHER): Payer: BC Managed Care – PPO | Admitting: Orthopaedic Surgery

## 2017-03-29 ENCOUNTER — Encounter (INDEPENDENT_AMBULATORY_CARE_PROVIDER_SITE_OTHER): Payer: Self-pay | Admitting: *Deleted

## 2017-03-29 ENCOUNTER — Other Ambulatory Visit (INDEPENDENT_AMBULATORY_CARE_PROVIDER_SITE_OTHER): Payer: Self-pay | Admitting: *Deleted

## 2017-03-29 DIAGNOSIS — K5000 Crohn's disease of small intestine without complications: Secondary | ICD-10-CM

## 2017-06-01 ENCOUNTER — Encounter (INDEPENDENT_AMBULATORY_CARE_PROVIDER_SITE_OTHER): Payer: Self-pay

## 2017-06-01 ENCOUNTER — Encounter (INDEPENDENT_AMBULATORY_CARE_PROVIDER_SITE_OTHER): Payer: Self-pay | Admitting: Internal Medicine

## 2017-06-01 ENCOUNTER — Ambulatory Visit (INDEPENDENT_AMBULATORY_CARE_PROVIDER_SITE_OTHER): Payer: BC Managed Care – PPO | Admitting: Internal Medicine

## 2017-06-01 VITALS — BP 126/58 | HR 72 | Temp 97.9°F | Ht 63.0 in | Wt 271.5 lb

## 2017-06-01 DIAGNOSIS — K5010 Crohn's disease of large intestine without complications: Secondary | ICD-10-CM

## 2017-06-01 LAB — CBC WITH DIFFERENTIAL/PLATELET
Basophils Absolute: 40 cells/uL (ref 0–200)
Basophils Relative: 0.5 %
Eosinophils Absolute: 87 cells/uL (ref 15–500)
Eosinophils Relative: 1.1 %
HCT: 44.4 % (ref 35.0–45.0)
Hemoglobin: 15.2 g/dL (ref 11.7–15.5)
Lymphs Abs: 1762 cells/uL (ref 850–3900)
MCH: 33.6 pg — ABNORMAL HIGH (ref 27.0–33.0)
MCHC: 34.2 g/dL (ref 32.0–36.0)
MCV: 98.2 fL (ref 80.0–100.0)
MPV: 10 fL (ref 7.5–12.5)
Monocytes Relative: 8.2 %
Neutro Abs: 5364 cells/uL (ref 1500–7800)
Neutrophils Relative %: 67.9 %
Platelets: 301 10*3/uL (ref 140–400)
RBC: 4.52 10*6/uL (ref 3.80–5.10)
RDW: 11.9 % (ref 11.0–15.0)
Total Lymphocyte: 22.3 %
WBC mixed population: 648 cells/uL (ref 200–950)
WBC: 7.9 10*3/uL (ref 3.8–10.8)

## 2017-06-01 NOTE — Patient Instructions (Signed)
OV in 6 months. 

## 2017-06-01 NOTE — Progress Notes (Signed)
Subjective:    Patient ID: [name], female    DOB: 04-Oct-1965, 51 y.o.   MRN: [id number]  HPI Here today for f/u. Last seen in April of this by Dr. Laural Golden. Hx of Crohn colitis.  Mantained on 6-MP 75mg   She tells me she is doing good. She is having 1-2 stools a day. She usually take the Dicyclomine once a day and sometimes twice a day. Appetite is good. She has gained about 13 pounds since her last visit. She has joined [employer].  Her last colonoscopy was in 2016.   Colonoscopy.  Indications: Patient is 51 year old Caucasian female who has history of Crohn's colitis who previously has been documented to be in remission now maintained on Pentasa presents with worsening diarrhea and rectal bleeding. Patient is back on prednisone. She is undergoing colonoscopy primarily to document relapse of disease and also rule out CMV colitis Impression:  Examination performed to cecum. Few small hyperplastic appearing polyps at transverse colon and these were left alone. Patchy colitis involving cecum and mid transverse colon.  Patchy colitis involving distal sigmoid colon. Diffuse rectal involvement with edema erythema erosions and friable mucosa. Biopsies taken from cecum and transverse colon, sigmoid colon and rectum. Biopsy: Chronic active colitis. No viral cytopathic effect. No dysplasia or malignancy.    03/29/2017 CRP 10.7 CBC    Component Value Date/Time   WBC 7.5 12/01/2016 0959   RBC 4.42 12/01/2016 0959   HGB 14.6 12/01/2016 0959   HCT 44.7 12/01/2016 0959   PLT 297 12/01/2016 0959   MCV 101.1 (H) 12/01/2016 0959   MCH 33.0 12/01/2016 0959   MCHC 32.7 12/01/2016 0959   RDW 12.7 12/01/2016 0959   LYMPHSABS 1,575 12/01/2016 0959   MONOABS 525 12/01/2016 0959   EOSABS 75 12/01/2016 0959   BASOSABS 0 12/01/2016 0959     Review of Systems Past Medical History:  Diagnosis Date  . Anxiety   . Arthritis   . Asthma   . Crohn disease ([place])   . Diabetes  mellitus    diet controlled  . High cholesterol   . Hypertension    pcp  Dr Coralyn Mark Quillian Quince    [place]  . Sleep apnea 03-2009 study   pt forgot to mention at pre admit visit.  uses c-pap brought her own  machine from home.     Past Surgical History:  Procedure Laterality Date  . CHOLECYSTECTOMY N/A 03/31/2013   Procedure: LAPAROSCOPIC CHOLECYSTECTOMY;  Surgeon: Jamesetta So, MD;  [facility];  Service: General;  Laterality: N/A;  . COLONOSCOPY N/A 03/16/2013   Procedure: COLONOSCOPY;  Surgeon: Rogene Houston, MD;  [facility];  Service: Endoscopy;  Laterality: N/A;  255  . COLONOSCOPY N/A 12/31/2014   Procedure: COLONOSCOPY;  Surgeon: Rogene Houston, MD;  [facility];  Service: Endoscopy;  Laterality: N/A;  730  . cyst removed from left hand    . FLEXIBLE SIGMOIDOSCOPY N/A 08/21/2014   Procedure: FLEXIBLE SIGMOIDOSCOPY;  Surgeon: Rogene Houston, MD;  [facility];  Service: Endoscopy;  Laterality: N/A;  1030  . TONSILLECTOMY    . TOTAL HIP ARTHROPLASTY  04/18/2012   Procedure: TOTAL HIP ARTHROPLASTY;  Surgeon: Marybelle Killings, MD;  Location: [facility];  Service: Orthopedics;  Laterality: Right;  Right Total Hip Arthroplasty    Allergies  Allergen Reactions  . Remicade [Infliximab] Swelling    Joints locked up  . Cefprozil Rash  . Penicillins Rash  Current Outpatient Prescriptions on File Prior to Visit  Medication Sig Dispense Refill  . albuterol (PROVENTIL,VENTOLIN) 90 MCG/ACT inhaler Inhale 2 puffs into the lungs every 6 (six) hours as needed. Shortness of breath    . CAMRESE 0.15-0.03 &0.01 MG tablet Take 1 tablet by mouth daily.     . cetirizine (ZYRTEC) 10 MG tablet Take 10 mg by mouth every morning.     . citalopram (CELEXA) 20 MG tablet Take 20 mg by mouth every morning.     . dicyclomine (BENTYL) 20 MG tablet TAKE ONE TABLET BY MOUTH THREE TIMES DAILY AS NEEDED FOR SPASMS 90 tablet 3  . diltiazem (TIAZAC) 360 MG 24 hr capsule Take 360 mg by  mouth every morning.     . Fluticasone-Salmeterol (ADVAIR) 250-50 MCG/DOSE AEPB Inhale 1 puff into the lungs every morning.     Marland Kitchen JARDIANCE 25 MG TABS tablet 25 mg daily.     Marland Kitchen lisinopril-hydrochlorothiazide (PRINZIDE,ZESTORETIC) 20-12.5 MG per tablet Take 2 tablets by mouth every morning.     . loperamide (IMODIUM) 2 MG capsule Take by mouth as needed for diarrhea or loose stools.    . mercaptopurine (PURINETHOL) 50 MG tablet Take 1.5 tablets (75 mg total) by mouth daily. Give on an empty stomach 1 hour before or 2 hours after meals. Caution: Chemotherapy. 45 tablet 11  . Multiple Vitamin (MULITIVITAMIN WITH MINERALS) TABS Take 1 tablet by mouth every morning.     Marland Kitchen PENTASA 500 MG CR capsule TAKE TWO (2) CAPSULES BY MOUTH FOUR TIMES DAILY 240 capsule 11  . rosuvastatin (CRESTOR) 5 MG tablet Take 5 mg by mouth every morning.     Marland Kitchen spironolactone (ALDACTONE) 25 MG tablet Take 25 mg by mouth every morning.      No current facility-administered medications on file prior to visit.         Objective:   Physical Exam Blood pressure (!) 126/58, pulse 72, temperature 97.9 F (36.6 C), height 5\' 3"  (1.6 m), weight 271 lb 8 oz (123.2 kg).  Alert and oriented. Skin warm and dry. Oral mucosa is moist.   . Sclera anicteric, conjunctivae is pink. Thyroid not enlarged. No cervical lymphadenopathy. Lungs clear. Heart regular rate and rhythm.  Abdomen is soft. Bowel sounds are positive. No hepatomegaly. No abdominal masses felt. No tenderness.  No edema to lower extremities.          Assessment & Plan:  Crohns collitis. Last CRP slightly elevated. Will leave 6MP at present dose until I get CRP back.  Will get a CRP and CBC today. OV in 6 months.

## 2017-06-02 LAB — C-REACTIVE PROTEIN: CRP: 10.8 mg/L — ABNORMAL HIGH (ref <8.0)

## 2017-09-16 ENCOUNTER — Ambulatory Visit (INDEPENDENT_AMBULATORY_CARE_PROVIDER_SITE_OTHER): Payer: BC Managed Care – PPO | Admitting: Orthopaedic Surgery

## 2017-11-04 ENCOUNTER — Other Ambulatory Visit (INDEPENDENT_AMBULATORY_CARE_PROVIDER_SITE_OTHER): Payer: Self-pay | Admitting: Internal Medicine

## 2017-11-27 ENCOUNTER — Other Ambulatory Visit (INDEPENDENT_AMBULATORY_CARE_PROVIDER_SITE_OTHER): Payer: Self-pay | Admitting: Internal Medicine

## 2017-11-30 ENCOUNTER — Ambulatory Visit (INDEPENDENT_AMBULATORY_CARE_PROVIDER_SITE_OTHER): Payer: BC Managed Care – PPO | Admitting: Internal Medicine

## 2017-11-30 ENCOUNTER — Encounter (INDEPENDENT_AMBULATORY_CARE_PROVIDER_SITE_OTHER): Payer: Self-pay | Admitting: Internal Medicine

## 2017-11-30 VITALS — BP 160/60 | HR 72 | Temp 97.0°F | Ht 63.0 in | Wt 284.3 lb

## 2017-11-30 DIAGNOSIS — K5010 Crohn's disease of large intestine without complications: Secondary | ICD-10-CM | POA: Diagnosis not present

## 2017-11-30 NOTE — Patient Instructions (Signed)
CBC and CRP. OV in 6 months.  

## 2017-11-30 NOTE — Progress Notes (Signed)
Subjective:    Patient ID: [name], female    DOB: 04/19/1966, 52 y.o.   MRN: [id number]  HPI Here today for f/u. She was last seen in October of 2018 for f/u of her Crohn's disease. Maintained on 6-MP 75mg .  Her last colonoscopy was in 2016 and biopsy revealed chronic active colitis.  No viral cytopathic effect. No dysphasia or malignancy.  She tells me she is doing good. No GI complaints. She has a loose stool occasionally. No melena or BRRB.  Appetite is good.  She has gained 13 pounds since her last visit.   CBC    Component Value Date/Time   WBC 7.9 06/01/2017 1020   RBC 4.52 06/01/2017 1020   HGB 15.2 06/01/2017 1020   HCT 44.4 06/01/2017 1020   PLT 301 06/01/2017 1020   MCV 98.2 06/01/2017 1020   MCH 33.6 (H) 06/01/2017 1020   MCHC 34.2 06/01/2017 1020   RDW 11.9 06/01/2017 1020   LYMPHSABS 1,762 06/01/2017 1020   MONOABS 525 12/01/2016 0959   EOSABS 87 06/01/2017 1020   BASOSABS 40 06/01/2017 1020   06/01/2017 CRP 10.8   Review of Systems Past Medical History:  Diagnosis Date  . Anxiety   . Arthritis   . Asthma   . Crohn disease ([place])   . Diabetes mellitus    diet controlled  . High cholesterol   . Hypertension    pcp  Dr Coralyn Mark Quillian Quince    [place]  . Sleep apnea 03-2009 study   pt forgot to mention at pre admit visit.  uses c-pap brought her own  machine from home.     Past Surgical History:  Procedure Laterality Date  . CHOLECYSTECTOMY N/A 03/31/2013   Procedure: LAPAROSCOPIC CHOLECYSTECTOMY;  Surgeon: Jamesetta So, MD;  [facility];  Service: General;  Laterality: N/A;  . COLONOSCOPY N/A 03/16/2013   Procedure: COLONOSCOPY;  Surgeon: Rogene Houston, MD;  [facility];  Service: Endoscopy;  Laterality: N/A;  255  . COLONOSCOPY N/A 12/31/2014   Procedure: COLONOSCOPY;  Surgeon: Rogene Houston, MD;  [facility];  Service: Endoscopy;  Laterality: N/A;  730  . cyst removed from left hand    . FLEXIBLE SIGMOIDOSCOPY N/A  08/21/2014   Procedure: FLEXIBLE SIGMOIDOSCOPY;  Surgeon: Rogene Houston, MD;  [facility];  Service: Endoscopy;  Laterality: N/A;  1030  . TONSILLECTOMY    . TOTAL HIP ARTHROPLASTY  04/18/2012   Procedure: TOTAL HIP ARTHROPLASTY;  Surgeon: Marybelle Killings, MD;  Location: [facility];  Service: Orthopedics;  Laterality: Right;  Right Total Hip Arthroplasty    Allergies  Allergen Reactions  . Remicade [Infliximab] Swelling    Joints locked up  . Cefprozil Rash  . Penicillins Rash    Current Outpatient Medications on File Prior to Visit  Medication Sig Dispense Refill  . albuterol (PROVENTIL,VENTOLIN) 90 MCG/ACT inhaler Inhale 2 puffs into the lungs every 6 (six) hours as needed. Shortness of breath    . CAMRESE 0.15-0.03 &0.01 MG tablet Take 1 tablet by mouth daily.     . cetirizine (ZYRTEC) 10 MG tablet Take 10 mg by mouth every morning.     . citalopram (CELEXA) 20 MG tablet Take 20 mg by mouth every morning.     . dicyclomine (BENTYL) 20 MG tablet TAKE ONE TABLET BY MOUTH THREE TIMES DAILY AS NEEDED FOR SPASMS 90 tablet 3  . diltiazem (TIAZAC) 360 MG 24 hr capsule Take  360 mg by mouth every morning.     . Fluticasone-Salmeterol (ADVAIR) 250-50 MCG/DOSE AEPB Inhale 1 puff into the lungs every morning.     Marland Kitchen JARDIANCE 25 MG TABS tablet 25 mg daily.     Marland Kitchen lisinopril-hydrochlorothiazide (PRINZIDE,ZESTORETIC) 20-12.5 MG per tablet Take 2 tablets by mouth every morning.     . loperamide (IMODIUM) 2 MG capsule Take by mouth as needed for diarrhea or loose stools.    . mercaptopurine (PURINETHOL) 50 MG tablet Take 1.5 tablets (75 mg total) by mouth daily. Give on an empty stomach 1 hour before or 2 hours after meals. Caution: Chemotherapy. 45 tablet 11  . Multiple Vitamin (MULITIVITAMIN WITH MINERALS) TABS Take 1 tablet by mouth every morning.     Marland Kitchen PENTASA 500 MG CR capsule TAKE TWO (2) CAPSULES BY MOUTH FOUR TIMES DAILY 240 capsule 11  . rosuvastatin (CRESTOR) 5 MG tablet Take 5 mg by  mouth every morning.     Marland Kitchen spironolactone (ALDACTONE) 25 MG tablet Take 25 mg by mouth every morning.      No current facility-administered medications on file prior to visit.         Objective:   Physical Exam Blood pressure (!) 160/60, pulse 72, temperature (!) 97 F (36.1 C), height 5\' 3"  (1.6 m), weight 284 lb 4.8 oz (129 kg). Alert and oriented. Skin warm and dry. Oral mucosa is moist.   . Sclera anicteric, conjunctivae is pink. Thyroid not enlarged. No cervical lymphadenopathy. Lungs clear. Heart regular rate and rhythm.  Abdomen is soft. Bowel sounds are positive. No hepatomegaly. No abdominal masses felt. No tenderness.  No edema to lower extremities.           Assessment & Plan:  Crohn's colitis. Her last colonoscopy was in 2016 which showed mildly active disease. Herlast CRP was 10.8. Continue 6-MP and oral Mesalamine at current dose. OV in 6 months.

## 2017-12-01 LAB — CBC
HCT: 43.5 % (ref 35.0–45.0)
Hemoglobin: 14.9 g/dL (ref 11.7–15.5)
MCH: 33.6 pg — ABNORMAL HIGH (ref 27.0–33.0)
MCHC: 34.3 g/dL (ref 32.0–36.0)
MCV: 98.2 fL (ref 80.0–100.0)
MPV: 10.2 fL (ref 7.5–12.5)
Platelets: 291 10*3/uL (ref 140–400)
RBC: 4.43 10*6/uL (ref 3.80–5.10)
RDW: 11.8 % (ref 11.0–15.0)
WBC: 7 10*3/uL (ref 3.8–10.8)

## 2017-12-01 LAB — C-REACTIVE PROTEIN: CRP: 9.3 mg/L — ABNORMAL HIGH (ref <8.0)

## 2017-12-14 ENCOUNTER — Other Ambulatory Visit (INDEPENDENT_AMBULATORY_CARE_PROVIDER_SITE_OTHER): Payer: Self-pay | Admitting: Internal Medicine

## 2018-04-21 ENCOUNTER — Ambulatory Visit (INDEPENDENT_AMBULATORY_CARE_PROVIDER_SITE_OTHER): Payer: BC Managed Care – PPO | Admitting: Orthopaedic Surgery

## 2018-04-21 ENCOUNTER — Encounter (INDEPENDENT_AMBULATORY_CARE_PROVIDER_SITE_OTHER): Payer: Self-pay | Admitting: Orthopaedic Surgery

## 2018-04-21 VITALS — BP 93/51 | HR 87 | Ht 63.0 in | Wt 280.0 lb

## 2018-04-21 DIAGNOSIS — M65311 Trigger thumb, right thumb: Secondary | ICD-10-CM | POA: Diagnosis not present

## 2018-04-21 MED ORDER — METHYLPREDNISOLONE ACETATE 40 MG/ML IJ SUSP
13.3300 mg | INTRAMUSCULAR | Status: AC | PRN
  Administered 2018-04-21: 13.33 mg

## 2018-04-21 MED ORDER — LIDOCAINE HCL 1 % IJ SOLN
0.3000 mL | INTRAMUSCULAR | Status: AC | PRN
  Administered 2018-04-21: .3 mL

## 2018-04-21 MED ORDER — BUPIVACAINE HCL 0.25 % IJ SOLN
0.3300 mL | INTRAMUSCULAR | Status: AC | PRN
  Administered 2018-04-21: .33 mL

## 2018-04-21 NOTE — Progress Notes (Signed)
Office Visit Note   Patient: [name]           Date of Birth: [birth year]           MRN: [id number] Visit Date: 04/21/2018              Requested by: Caryl Bis, MD [place], [place] [address] PCP: Caryl Bis, MD   Assessment & Plan: Visit Diagnoses:  1. Trigger thumb, right thumb     Plan: Trigger thumb injection performed which she tolerated.  She can use a splint over the IP joint of the thumb for a week or so.  She will call if she has persistent triggering or increased hand numbness.  She has ongoing problems with back problems and was told years ago that she was not a surgical candidate because she lost some weight.  She is lost 40 or 50 pounds but needs to continue to work on weight loss.  She gets increased radicular symptoms in lower extremities she can let us know we can consider reimaging.  Follow-Up Instructions: No follow-ups on file.   Orders:  Orders Placed This Encounter  Procedures  . Hand/UE Inj: R thumb A1   No orders of the defined types were placed in this encounter.     Procedures: Hand/UE Inj: R thumb A1 for trigger finger on 04/21/2018 9:39 AM Medications: 0.3 mL lidocaine 1 %; 0.33 mL bupivacaine 0.25 %; 13.33 mg methylPREDNISolone acetate 40 MG/ML      Clinical Data: No additional findings.   Subjective: Chief Complaint  Patient presents with  . Right Thumb - Pain  . Lower Back - Pain    HPI 52 year old female with 5-week history of right trigger thumb.  It wakes her up at night she has to manually reduce it.  She is used [employer].  She put a brace on it really has not helped.  She is also had some associated back pain and saw Dr. Carloyn Manner many years ago and was told she was a little too heavy for surgery on her disc.  Since that time she is lost some weight but continues to have ongoing back pain.  She also has Crohn's disease.  Review of Systems past history of osteoarthritis of the hip, hypertension high  cholesterol, diabetes of pregnancy, Crohn's disease, obesity with BMI of 49, IBS.,  Right trigger thumb x5 weeks.  Otherwise negative as it pertains to HPI 14 point systems updated.   Objective: Vital Signs: BP (!) 93/51   Pulse 87   Ht 5\' 3"  (1.6 m)   Wt 280 lb (127 kg)   BMI 49.60 kg/m   Physical Exam  Constitutional: She is oriented to person, place, and time. She appears well-developed.  HENT:  Head: Normocephalic.  Right Ear: External ear normal.  Left Ear: External ear normal.  Eyes: Pupils are equal, round, and reactive to light.  Neck: No tracheal deviation present. No thyromegaly present.  Cardiovascular: Normal rate.  Pulmonary/Chest: Effort normal.  Abdominal: Soft.  Neurological: She is alert and oriented to person, place, and time.  Skin: Skin is warm and dry.  Psychiatric: She has a normal mood and affect. Her behavior is normal.    Ortho Exam well-healed right posterior total hip arthroplasty incision.  Negative Trendelenburg gait.  No discomfort with internal and external rotation opposite hip.  Patient has exquisite tenderness over A1 pulley right thumb sensation of the fingertip is intact.  She demonstrates active triggering  has to manually reduce it.  No tenderness over the A2 a 4 pulley.  She has some tenderness over the carpal canal some numbness with Phalen's maneuver index and long finger.  Specialty Comments:  No specialty comments available.  Imaging: No results found.   PMFS History: Patient Active Problem List   Diagnosis Date Noted  . Trigger thumb, right thumb 04/21/2018  . IBS (irritable bowel syndrome) 06/18/2015  . Obesity 10/08/2014  . Osteoarthritis of right hip 04/20/2012  . Hypertension 10/08/2011  . High cholesterol 10/08/2011  . Diabetes in pregnancy 10/08/2011  . Crohn disease ([place]) 10/08/2011   Past Medical History:  Diagnosis Date  . Anxiety   . Arthritis   . Asthma   . Crohn disease ([place])   . Diabetes mellitus    diet  controlled  . High cholesterol   . Hypertension    pcp  Dr Coralyn Mark Quillian Quince    [place]  . Sleep apnea 03-2009 study   pt forgot to mention at pre admit visit.  uses c-pap brought her own  machine from home.     No family history on file.  Past Surgical History:  Procedure Laterality Date  . CHOLECYSTECTOMY N/A 03/31/2013   Procedure: LAPAROSCOPIC CHOLECYSTECTOMY;  Surgeon: Jamesetta So, MD;  [facility];  Service: General;  Laterality: N/A;  . COLONOSCOPY N/A 03/16/2013   Procedure: COLONOSCOPY;  Surgeon: Rogene Houston, MD;  [facility];  Service: Endoscopy;  Laterality: N/A;  255  . COLONOSCOPY N/A 12/31/2014   Procedure: COLONOSCOPY;  Surgeon: Rogene Houston, MD;  [facility];  Service: Endoscopy;  Laterality: N/A;  730  . cyst removed from left hand    . FLEXIBLE SIGMOIDOSCOPY N/A 08/21/2014   Procedure: FLEXIBLE SIGMOIDOSCOPY;  Surgeon: Rogene Houston, MD;  [facility];  Service: Endoscopy;  Laterality: N/A;  1030  . TONSILLECTOMY    . TOTAL HIP ARTHROPLASTY  04/18/2012   Procedure: TOTAL HIP ARTHROPLASTY;  Surgeon: Marybelle Killings, MD;  Location: [facility];  Service: Orthopedics;  Laterality: Right;  Right Total Hip Arthroplasty   Social History   Occupational History  . Not on file  Tobacco Use  . Smoking status: Never Smoker  . Smokeless tobacco: Never Used  Substance and Sexual Activity  . Alcohol use: No    Alcohol/week: 0.0 standard drinks  . Drug use: No  . Sexual activity: Yes    Birth control/protection: Pill

## 2018-06-01 ENCOUNTER — Ambulatory Visit (INDEPENDENT_AMBULATORY_CARE_PROVIDER_SITE_OTHER): Payer: BC Managed Care – PPO | Admitting: Internal Medicine

## 2018-06-02 ENCOUNTER — Encounter (INDEPENDENT_AMBULATORY_CARE_PROVIDER_SITE_OTHER): Payer: Self-pay | Admitting: Internal Medicine

## 2018-06-02 ENCOUNTER — Ambulatory Visit (INDEPENDENT_AMBULATORY_CARE_PROVIDER_SITE_OTHER): Payer: BC Managed Care – PPO | Admitting: Internal Medicine

## 2018-06-02 ENCOUNTER — Telehealth (INDEPENDENT_AMBULATORY_CARE_PROVIDER_SITE_OTHER): Payer: Self-pay | Admitting: Internal Medicine

## 2018-06-02 VITALS — BP 136/80 | HR 66 | Temp 98.2°F | Ht 63.0 in | Wt 284.2 lb

## 2018-06-02 DIAGNOSIS — K5010 Crohn's disease of large intestine without complications: Secondary | ICD-10-CM

## 2018-06-02 NOTE — Patient Instructions (Signed)
Labs today. OV in 6 months.  

## 2018-06-02 NOTE — Telephone Encounter (Signed)
[place]

## 2018-06-02 NOTE — Telephone Encounter (Signed)
Please change patients pharmacy to [facility] in [place]

## 2018-06-02 NOTE — Progress Notes (Signed)
Subjective:    Patient ID: [name], female    DOB: 01/07/1966, 52 y.o.   MRN: [id number]  HPI Here today for f/u. Last seen in April of this year. Hx of Crohn's disease and maintained on 6MP. Her last colonoscopy was in 2016 and biopsy revealed chronic active colitis.  No viral cytopathic effect. No dysphasia or malignancy.  Mother has had back surgery. Husband had spurs removed from his leg.   12/18/2017 CRP 9.3 She is doing well. Her appetite is good. BMs are loose. BM 2-3 times . No melena or BRRB. Very little pain.  CBC    Component Value Date/Time   WBC 7.0 11/30/2017 1101   RBC 4.43 11/30/2017 1101   HGB 14.9 11/30/2017 1101   HCT 43.5 11/30/2017 1101   PLT 291 11/30/2017 1101   MCV 98.2 11/30/2017 1101   MCH 33.6 (H) 11/30/2017 1101   MCHC 34.3 11/30/2017 1101   RDW 11.8 11/30/2017 1101   LYMPHSABS 1,762 06/01/2017 1020   MONOABS 525 12/01/2016 0959   EOSABS 87 06/01/2017 1020   BASOSABS 40 06/01/2017 1020      Review of Systems Past Medical History:  Diagnosis Date  . Anxiety   . Arthritis   . Asthma   . Crohn disease ([place])   . Diabetes mellitus    diet controlled  . High cholesterol   . Hypertension    pcp  Dr Coralyn Mark Quillian Quince    [place]  . Sleep apnea 03-2009 study   pt forgot to mention at pre admit visit.  uses c-pap brought her own  machine from home.     Past Surgical History:  Procedure Laterality Date  . CHOLECYSTECTOMY N/A 03/31/2013   Procedure: LAPAROSCOPIC CHOLECYSTECTOMY;  Surgeon: Jamesetta So, MD;  [facility];  Service: General;  Laterality: N/A;  . COLONOSCOPY N/A 03/16/2013   Procedure: COLONOSCOPY;  Surgeon: Rogene Houston, MD;  [facility];  Service: Endoscopy;  Laterality: N/A;  255  . COLONOSCOPY N/A 12/31/2014   Procedure: COLONOSCOPY;  Surgeon: Rogene Houston, MD;  [facility];  Service: Endoscopy;  Laterality: N/A;  730  . cyst removed from left hand    . FLEXIBLE SIGMOIDOSCOPY N/A 08/21/2014   Procedure: FLEXIBLE SIGMOIDOSCOPY;  Surgeon: Rogene Houston, MD;  [facility];  Service: Endoscopy;  Laterality: N/A;  1030  . TONSILLECTOMY    . TOTAL HIP ARTHROPLASTY  04/18/2012   Procedure: TOTAL HIP ARTHROPLASTY;  Surgeon: Marybelle Killings, MD;  Location: [facility];  Service: Orthopedics;  Laterality: Right;  Right Total Hip Arthroplasty    Allergies  Allergen Reactions  . Remicade [Infliximab] Swelling    Joints locked up  . Cefprozil Rash  . Penicillins Rash    Current Outpatient Medications on File Prior to Visit  Medication Sig Dispense Refill  . albuterol (PROVENTIL,VENTOLIN) 90 MCG/ACT inhaler Inhale 2 puffs into the lungs every 6 (six) hours as needed. Shortness of breath    . CAMRESE 0.15-0.03 &0.01 MG tablet Take 1 tablet by mouth daily.     . cetirizine (ZYRTEC) 10 MG tablet Take 10 mg by mouth every morning.     . citalopram (CELEXA) 20 MG tablet Take 20 mg by mouth every morning.     . dicyclomine (BENTYL) 20 MG tablet TAKE ONE TABLET BY MOUTH THREE TIMES DAILY AS NEEDED FOR SPASMS 90 tablet 3  . diltiazem (TIAZAC) 360 MG 24 hr capsule Take 360 mg by  mouth every morning.     . Fluticasone-Salmeterol (ADVAIR) 250-50 MCG/DOSE AEPB Inhale 1 puff into the lungs every morning.     Marland Kitchen JARDIANCE 25 MG TABS tablet 25 mg daily.     Marland Kitchen lisinopril-hydrochlorothiazide (PRINZIDE,ZESTORETIC) 20-12.5 MG per tablet Take 2 tablets by mouth every morning.     . loperamide (IMODIUM) 2 MG capsule Take by mouth as needed for diarrhea or loose stools.    . mercaptopurine (PURINETHOL) 50 MG tablet TAKE 1 AND 1/2 TABLETS BY MOUTH DAILY ON EMPTY STOMACH ONE HOUR PRIOR TO MEALS OR TWO HOURS AFTER MEALS. 45 tablet 5  . Multiple Vitamin (MULITIVITAMIN WITH MINERALS) TABS Take 1 tablet by mouth every morning.     Marland Kitchen PENTASA 500 MG CR capsule TAKE TWO (2) CAPSULES BY MOUTH FOUR TIMES DAILY 240 capsule 11  . rosuvastatin (CRESTOR) 5 MG tablet Take 5 mg by mouth every morning.     Marland Kitchen spironolactone  (ALDACTONE) 25 MG tablet Take 25 mg by mouth every morning.      No current facility-administered medications on file prior to visit.         Objective:   Physical Exam Blood pressure 136/80, pulse 66, temperature 98.2 F (36.8 C), height 5\' 3"  (1.6 m), weight 284 lb 3.2 oz (128.9 kg). Alert and oriented. Skin warm and dry. Oral mucosa is moist.   . Sclera anicteric, conjunctivae is pink. Thyroid not enlarged. No cervical lymphadenopathy. Lungs clear. Heart regular rate and rhythm.  Abdomen is soft. Bowel sounds are positive. No hepatomegaly. No abdominal masses felt. No tenderness.  No edema to lower extremities.  .         Assessment & Plan:  Crohn's. Will get a CBC and crp. OV in 6 months.

## 2018-06-03 LAB — CBC
HCT: 42.6 % (ref 35.0–45.0)
Hemoglobin: 14.4 g/dL (ref 11.7–15.5)
MCH: 33.2 pg — ABNORMAL HIGH (ref 27.0–33.0)
MCHC: 33.8 g/dL (ref 32.0–36.0)
MCV: 98.2 fL (ref 80.0–100.0)
MPV: 9.9 fL (ref 7.5–12.5)
Platelets: 310 10*3/uL (ref 140–400)
RBC: 4.34 10*6/uL (ref 3.80–5.10)
RDW: 11.8 % (ref 11.0–15.0)
WBC: 9.3 10*3/uL (ref 3.8–10.8)

## 2018-06-03 LAB — C-REACTIVE PROTEIN: CRP: 7.1 mg/L (ref <8.0)

## 2018-06-13 ENCOUNTER — Other Ambulatory Visit: Payer: Self-pay | Admitting: Student

## 2018-06-13 DIAGNOSIS — G8929 Other chronic pain: Secondary | ICD-10-CM

## 2018-06-13 DIAGNOSIS — M5442 Lumbago with sciatica, left side: Principal | ICD-10-CM

## 2018-06-13 DIAGNOSIS — M5441 Lumbago with sciatica, right side: Principal | ICD-10-CM

## 2018-06-22 ENCOUNTER — Ambulatory Visit
Admission: RE | Admit: 2018-06-22 | Discharge: 2018-06-22 | Disposition: A | Discharge status: Home / Self Care | Payer: BC Managed Care – PPO | Source: Ambulatory Visit | Attending: Student | Admitting: Student

## 2018-06-22 DIAGNOSIS — G8929 Other chronic pain: Secondary | ICD-10-CM

## 2018-06-22 DIAGNOSIS — M5441 Lumbago with sciatica, right side: Principal | ICD-10-CM

## 2018-06-22 DIAGNOSIS — M5442 Lumbago with sciatica, left side: Principal | ICD-10-CM

## 2018-06-29 ENCOUNTER — Other Ambulatory Visit (INDEPENDENT_AMBULATORY_CARE_PROVIDER_SITE_OTHER): Payer: Self-pay | Admitting: Internal Medicine

## 2018-09-21 ENCOUNTER — Other Ambulatory Visit (INDEPENDENT_AMBULATORY_CARE_PROVIDER_SITE_OTHER): Payer: Self-pay | Admitting: Internal Medicine

## 2018-10-06 ENCOUNTER — Ambulatory Visit (INDEPENDENT_AMBULATORY_CARE_PROVIDER_SITE_OTHER): Payer: BC Managed Care – PPO | Admitting: Orthopaedic Surgery

## 2018-10-06 ENCOUNTER — Encounter (INDEPENDENT_AMBULATORY_CARE_PROVIDER_SITE_OTHER): Payer: Self-pay | Admitting: Orthopaedic Surgery

## 2018-10-06 VITALS — BP 100/60 | HR 100 | Ht 63.0 in | Wt 284.0 lb

## 2018-10-06 DIAGNOSIS — M65311 Trigger thumb, right thumb: Secondary | ICD-10-CM | POA: Diagnosis not present

## 2018-10-06 NOTE — Progress Notes (Signed)
Office Visit Note   Patient: [name]           Date of Birth: [birth year]           MRN: [id number] Visit Date: 10/06/2018              Requested by: Caryl Bis, MD [place], [place] [address] PCP: Caryl Bis, MD   Assessment & Plan: Visit Diagnoses:  1. Trigger thumb, right thumb     Plan: persistant triggering right thumb post injection with painful locks on her at night now going on more than 5 months. .  She states she like to proceed with trigger thumb release we will set this up as an outpatient under local anesthesia with IV sedation.  Follow-Up Instructions: No follow-ups on file.   Orders:  No orders of the defined types were placed in this encounter.  No orders of the defined types were placed in this encounter.     Procedures: No procedures performed   Clinical Data: No additional findings.   Subjective: Chief Complaint  Patient presents with  . Right Thumb - Follow-up    HPI 53 year old female returns post right trigger thumb injection 04/21/2018 states injection helped for about a week that is started triggering again is been progressively more painful with more locking and she demonstrates locking today.  She has trouble using her hand she is not able to open the door and states she is ready to proceed with trigger thumb release.  Review of Systems positive trigger thumb, hypertension high cholesterol, diabetes with pregnancy, Crohn's disease obesity IBS.  Otherwise negative as pertains HPI.  Positive for right hip osteoarthritis.   Objective: Vital Signs: BP 100/60   Pulse 100   Ht 5\' 3"  (1.6 m)   Wt 284 lb (128.8 kg)   BMI 50.31 kg/m   Physical Exam Constitutional:      Appearance: She is well-developed.  HENT:     Head: Normocephalic.     Right Ear: External ear normal.     Left Ear: External ear normal.  Eyes:     Pupils: Pupils are equal, round, and reactive to light.  Neck:     Thyroid: No thyromegaly.   Trachea: No tracheal deviation.  Cardiovascular:     Rate and Rhythm: Normal rate.  Pulmonary:     Effort: Pulmonary effort is normal.  Abdominal:     Palpations: Abdomen is soft.  Skin:    General: Skin is warm and dry.  Neurological:     Mental Status: She is alert and oriented to person, place, and time.  Psychiatric:        Behavior: Behavior normal.     Ortho Exam tenderness over the A1 pulley she demonstrates active triggering which is painful and requires manual reduction sensation of fingertips intact good cervical range of motion elbow reaches full extension no triggering of other digits.  Patient is right-hand dominant left thumb has good flexion extension without triggering.  Normal capillary refill carpal tunnel exam is negative.  Specialty Comments:  No specialty comments available.  Imaging: No results found.   PMFS History: Patient Active Problem List   Diagnosis Date Noted  . Trigger thumb, right thumb 04/21/2018  . IBS (irritable bowel syndrome) 06/18/2015  . Obesity 10/08/2014  . Osteoarthritis of right hip 04/20/2012  . Hypertension 10/08/2011  . High cholesterol 10/08/2011  . Diabetes in pregnancy 10/08/2011  . Crohn disease ([place]) 10/08/2011  Past Medical History:  Diagnosis Date  . Anxiety   . Arthritis   . Asthma   . Crohn disease ([place])   . Diabetes mellitus    diet controlled  . High cholesterol   . Hypertension    pcp  Dr Coralyn Mark Quillian Quince    [place]  . Sleep apnea 03-2009 study   pt forgot to mention at pre admit visit.  uses c-pap brought her own  machine from home.     No family history on file.  Past Surgical History:  Procedure Laterality Date  . CHOLECYSTECTOMY N/A 03/31/2013   Procedure: LAPAROSCOPIC CHOLECYSTECTOMY;  Surgeon: Jamesetta So, MD;  [facility];  Service: General;  Laterality: N/A;  . COLONOSCOPY N/A 03/16/2013   Procedure: COLONOSCOPY;  Surgeon: Rogene Houston, MD;  [facility];  Service: Endoscopy;   Laterality: N/A;  255  . COLONOSCOPY N/A 12/31/2014   Procedure: COLONOSCOPY;  Surgeon: Rogene Houston, MD;  [facility];  Service: Endoscopy;  Laterality: N/A;  730  . cyst removed from left hand    . FLEXIBLE SIGMOIDOSCOPY N/A 08/21/2014   Procedure: FLEXIBLE SIGMOIDOSCOPY;  Surgeon: Rogene Houston, MD;  [facility];  Service: Endoscopy;  Laterality: N/A;  1030  . TONSILLECTOMY    . TOTAL HIP ARTHROPLASTY  04/18/2012   Procedure: TOTAL HIP ARTHROPLASTY;  Surgeon: Marybelle Killings, MD;  Location: [facility];  Service: Orthopedics;  Laterality: Right;  Right Total Hip Arthroplasty   Social History   Occupational History  . Not on file  Tobacco Use  . Smoking status: Never Smoker  . Smokeless tobacco: Never Used  Substance and Sexual Activity  . Alcohol use: No    Alcohol/week: 0.0 standard drinks  . Drug use: No  . Sexual activity: Yes    Birth control/protection: Pill

## 2018-11-03 ENCOUNTER — Other Ambulatory Visit: Payer: Self-pay | Admitting: Sports Medicine

## 2018-11-03 DIAGNOSIS — M75102 Unspecified rotator cuff tear or rupture of left shoulder, not specified as traumatic: Secondary | ICD-10-CM

## 2018-11-10 ENCOUNTER — Other Ambulatory Visit: Payer: BC Managed Care – PPO

## 2018-11-10 ENCOUNTER — Inpatient Hospital Stay (INDEPENDENT_AMBULATORY_CARE_PROVIDER_SITE_OTHER): Payer: BC Managed Care – PPO | Admitting: Orthopaedic Surgery

## 2018-11-18 ENCOUNTER — Ambulatory Visit
Admission: RE | Admit: 2018-11-18 | Discharge: 2018-11-18 | Disposition: A | Discharge status: Home / Self Care | Payer: BC Managed Care – PPO | Source: Ambulatory Visit | Attending: Sports Medicine | Admitting: Sports Medicine

## 2018-11-18 ENCOUNTER — Other Ambulatory Visit: Payer: Self-pay

## 2018-11-18 DIAGNOSIS — M75102 Unspecified rotator cuff tear or rupture of left shoulder, not specified as traumatic: Secondary | ICD-10-CM

## 2018-11-19 ENCOUNTER — Other Ambulatory Visit: Payer: BC Managed Care – PPO

## 2018-11-28 ENCOUNTER — Ambulatory Visit (INDEPENDENT_AMBULATORY_CARE_PROVIDER_SITE_OTHER): Payer: BC Managed Care – PPO | Admitting: Internal Medicine

## 2018-11-28 ENCOUNTER — Encounter (INDEPENDENT_AMBULATORY_CARE_PROVIDER_SITE_OTHER): Payer: Self-pay | Admitting: Internal Medicine

## 2018-11-28 DIAGNOSIS — K5010 Crohn's disease of large intestine without complications: Secondary | ICD-10-CM

## 2018-11-28 NOTE — Patient Instructions (Signed)
CBC and CRP. OV in 6 months.  

## 2018-11-28 NOTE — Progress Notes (Addendum)
Subjective:    Patient ID: [name], female    DOB: 02/05/1966, 53 y.o.   MRN: [id number]  Patient agrees to speak to me for this OV. I am in the office. Unable to due video OV. Telephone OV due to COVID-19.   HPI This is a telephone visit. Patient is riding around with her mother. She is just getting out. . She consents to this OV. I am at the office. Here last visit was in October of 2019. Time 145pm-200 pm. Total OV time : 15 minutes. Marland Kitchen Hx of Crohn's and maintained on 6MP.  Her last colonoscopy was in 2016 and biopsy revealed chronic active colitis. No viral cytopathic effect. No dysplasia or malignancy. She tells me she is doing well. She says she has not seen any  melena or BRRB.  She is having 2 stools a day. She was diagnosed in 1999 with Crohn's disease.  Her appetite good. No weight loss. No GI complaints.     06/03/2019 CRP 7.1  CBC Latest Ref Rng & Units 06/02/2018 11/30/2017 06/01/2017  WBC 3.8 - 10.8 Thousand/uL 9.3 7.0 7.9  Hemoglobin 11.7 - 15.5 g/dL 14.4 14.9 15.2  Hematocrit 35.0 - 45.0 % 42.6 43.5 44.4  Platelets 140 - 400 Thousand/uL 310 291 301      Review of Systems Past Medical History:  Diagnosis Date  . Anxiety   . Arthritis   . Asthma   . Crohn disease ([place])   . Diabetes mellitus    diet controlled  . High cholesterol   . Hypertension    pcp  Dr Coralyn Mark Quillian Quince    [place]  . Sleep apnea 03-2009 study   pt forgot to mention at pre admit visit.  uses c-pap brought her own  machine from home.     Past Surgical History:  Procedure Laterality Date  . CHOLECYSTECTOMY N/A 03/31/2013   Procedure: LAPAROSCOPIC CHOLECYSTECTOMY;  Surgeon: Jamesetta So, MD;  Location: [facility];  Service: General;  Laterality: N/A;  . COLONOSCOPY N/A 03/16/2013   Procedure: COLONOSCOPY;  Surgeon: Rogene Houston, MD;  [facility];  Service: Endoscopy;  Laterality: N/A;  255  . COLONOSCOPY N/A 12/31/2014   Procedure: COLONOSCOPY;  Surgeon: Rogene Houston, MD;   [facility];  Service: Endoscopy;  Laterality: N/A;  730  . cyst removed from left hand    . FLEXIBLE SIGMOIDOSCOPY N/A 08/21/2014   Procedure: FLEXIBLE SIGMOIDOSCOPY;  Surgeon: Rogene Houston, MD;  [facility];  Service: Endoscopy;  Laterality: N/A;  1030  . TONSILLECTOMY    . TOTAL HIP ARTHROPLASTY  04/18/2012   Procedure: TOTAL HIP ARTHROPLASTY;  Surgeon: Marybelle Killings, MD;  Location: [facility];  Service: Orthopedics;  Laterality: Right;  Right Total Hip Arthroplasty    Allergies  Allergen Reactions  . Remicade [Infliximab] Swelling    Joints locked up  . Cefprozil Rash  . Penicillins Rash    Current Outpatient Medications on File Prior to Visit  Medication Sig Dispense Refill  . albuterol (PROVENTIL,VENTOLIN) 90 MCG/ACT inhaler Inhale 2 puffs into the lungs every 6 (six) hours as needed. Shortness of breath    . cetirizine (ZYRTEC) 10 MG tablet Take 10 mg by mouth every morning.     . citalopram (CELEXA) 20 MG tablet Take 20 mg by mouth every morning.     . dicyclomine (BENTYL) 20 MG tablet TAKE ONE TABLET BY MOUTH THREE TIMES DAILY AS NEEDED FOR SPASMS 90  tablet 3  . diltiazem (TIAZAC) 360 MG 24 hr capsule Take 360 mg by mouth every morning.     . DULoxetine (CYMBALTA) 60 MG capsule Take 60 mg by mouth daily.    . Fluticasone-Salmeterol (ADVAIR) 250-50 MCG/DOSE AEPB Inhale 1 puff into the lungs every morning.     Marland Kitchen JARDIANCE 25 MG TABS tablet 25 mg daily.     Marland Kitchen lisinopril-hydrochlorothiazide (PRINZIDE,ZESTORETIC) 20-12.5 MG per tablet Take 2 tablets by mouth every morning.     . loperamide (IMODIUM) 2 MG capsule Take by mouth as needed for diarrhea or loose stools.    . mercaptopurine (PURINETHOL) 50 MG tablet TAKE 1 AND 1/2 TABLETS BY MOUTH DAILY ON EMPTY STOMACH ONE HOUR PRIOR TO MEALS OR TWO HOURS AFTER MEALS. 45 tablet 5  . Multiple Vitamin (MULITIVITAMIN WITH MINERALS) TABS Take 1 tablet by mouth every morning.     Marland Kitchen PENTASA 500 MG CR capsule TAKE TWO (2)  CAPSULES BY MOUTH FOUR TIMES DAILY 240 capsule 11  . rosuvastatin (CRESTOR) 5 MG tablet Take 5 mg by mouth once a week.     . spironolactone (ALDACTONE) 25 MG tablet Take 25 mg by mouth every morning.     Marland Kitchen CAMRESE 0.15-0.03 &0.01 MG tablet Take 1 tablet by mouth daily.      No current facility-administered medications on file prior to visit.         Objective:   Physical Exam  deferred      Assessment & Plan:  Crohn's disease. She is doing well. Will see back in 6 months. CBC and CRP today.

## 2018-12-02 ENCOUNTER — Other Ambulatory Visit (INDEPENDENT_AMBULATORY_CARE_PROVIDER_SITE_OTHER): Payer: Self-pay | Admitting: Internal Medicine

## 2018-12-05 ENCOUNTER — Ambulatory Visit (INDEPENDENT_AMBULATORY_CARE_PROVIDER_SITE_OTHER): Payer: BC Managed Care – PPO | Admitting: Internal Medicine

## 2019-03-30 ENCOUNTER — Other Ambulatory Visit (INDEPENDENT_AMBULATORY_CARE_PROVIDER_SITE_OTHER): Payer: Self-pay | Admitting: Internal Medicine

## 2019-05-30 ENCOUNTER — Other Ambulatory Visit (INDEPENDENT_AMBULATORY_CARE_PROVIDER_SITE_OTHER): Payer: Self-pay | Admitting: Internal Medicine

## 2019-05-31 ENCOUNTER — Ambulatory Visit (INDEPENDENT_AMBULATORY_CARE_PROVIDER_SITE_OTHER): Payer: BC Managed Care – PPO | Admitting: Nurse Practitioner

## 2019-07-05 ENCOUNTER — Other Ambulatory Visit: Payer: Self-pay

## 2019-07-05 DIAGNOSIS — R202 Paresthesia of skin: Secondary | ICD-10-CM

## 2019-07-06 ENCOUNTER — Other Ambulatory Visit: Payer: Self-pay

## 2019-07-06 ENCOUNTER — Ambulatory Visit: Payer: BC Managed Care – PPO | Admitting: Orthopaedic Surgery

## 2019-07-13 ENCOUNTER — Ambulatory Visit (INDEPENDENT_AMBULATORY_CARE_PROVIDER_SITE_OTHER): Payer: BC Managed Care – PPO | Admitting: Nurse Practitioner

## 2019-07-24 ENCOUNTER — Other Ambulatory Visit: Payer: Self-pay

## 2019-07-25 ENCOUNTER — Encounter: Payer: BC Managed Care – PPO | Admitting: Neurology

## 2019-08-03 ENCOUNTER — Encounter: Payer: BC Managed Care – PPO | Admitting: Neurology

## 2019-08-04 ENCOUNTER — Encounter (HOSPITAL_COMMUNITY): Payer: Self-pay | Admitting: Emergency Medicine

## 2019-08-04 ENCOUNTER — Other Ambulatory Visit: Payer: Self-pay

## 2019-08-04 ENCOUNTER — Emergency Department (HOSPITAL_COMMUNITY)
Admission: EM | Admit: 2019-08-04 | Discharge: 2019-08-04 | Disposition: A | Discharge status: Home / Self Care | Payer: BC Managed Care – PPO | Attending: Emergency Medicine | Admitting: Emergency Medicine

## 2019-08-04 ENCOUNTER — Emergency Department (HOSPITAL_COMMUNITY): Payer: BC Managed Care – PPO

## 2019-08-04 DIAGNOSIS — Z79899 Other long term (current) drug therapy: Secondary | ICD-10-CM | POA: Insufficient documentation

## 2019-08-04 DIAGNOSIS — J45909 Unspecified asthma, uncomplicated: Secondary | ICD-10-CM | POA: Diagnosis not present

## 2019-08-04 DIAGNOSIS — E119 Type 2 diabetes mellitus without complications: Secondary | ICD-10-CM | POA: Insufficient documentation

## 2019-08-04 DIAGNOSIS — R1031 Right lower quadrant pain: Secondary | ICD-10-CM | POA: Diagnosis not present

## 2019-08-04 DIAGNOSIS — I10 Essential (primary) hypertension: Secondary | ICD-10-CM | POA: Diagnosis not present

## 2019-08-04 DIAGNOSIS — R109 Unspecified abdominal pain: Secondary | ICD-10-CM | POA: Diagnosis present

## 2019-08-04 LAB — COMPREHENSIVE METABOLIC PANEL
ALT: 19 U/L (ref 0–44)
AST: 17 U/L (ref 15–41)
Albumin: 3.9 g/dL (ref 3.5–5.0)
Alkaline Phosphatase: 68 U/L (ref 38–126)
Anion gap: 12 (ref 5–15)
BUN: 18 mg/dL (ref 6–20)
CO2: 24 mmol/L (ref 22–32)
Calcium: 9.2 mg/dL (ref 8.9–10.3)
Chloride: 100 mmol/L (ref 98–111)
Creatinine, Ser: 0.76 mg/dL (ref 0.44–1.00)
GFR calc Af Amer: >60 mL/min (ref >60)
GFR calc non Af Amer: >60 mL/min (ref >60)
Glucose, Bld: 158 mg/dL — ABNORMAL HIGH (ref 70–99)
Potassium: 3.8 mmol/L (ref 3.5–5.1)
Sodium: 136 mmol/L (ref 135–145)
Total Bilirubin: 1.1 mg/dL (ref 0.3–1.2)
Total Protein: 7.3 g/dL (ref 6.5–8.1)

## 2019-08-04 LAB — LIPASE, BLOOD: Lipase: 31 U/L (ref 11–51)

## 2019-08-04 LAB — CBC
HCT: 47.2 % — ABNORMAL HIGH (ref 36.0–46.0)
Hemoglobin: 15.4 g/dL — ABNORMAL HIGH (ref 12.0–15.0)
MCH: 33.3 pg (ref 26.0–34.0)
MCHC: 32.6 g/dL (ref 30.0–36.0)
MCV: 102.2 fL — ABNORMAL HIGH (ref 80.0–100.0)
Platelets: 283 10*3/uL (ref 150–400)
RBC: 4.62 MIL/uL (ref 3.87–5.11)
RDW: 12.6 % (ref 11.5–15.5)
WBC: 9.4 10*3/uL (ref 4.0–10.5)
nRBC: 0 % (ref 0.0–0.2)

## 2019-08-04 LAB — URINALYSIS, ROUTINE W REFLEX MICROSCOPIC
Bilirubin Urine: NEGATIVE
Glucose, UA: >=500 mg/dL — AB
Hgb urine dipstick: NEGATIVE
Ketones, ur: NEGATIVE mg/dL
Leukocytes,Ua: NEGATIVE
Nitrite: NEGATIVE
Protein, ur: NEGATIVE mg/dL
Specific Gravity, Urine: 1.014 (ref 1.005–1.030)
pH: 5 (ref 5.0–8.0)

## 2019-08-04 MED ORDER — IOHEXOL 300 MG/ML  SOLN
100.0000 mL | Freq: Once | INTRAMUSCULAR | Status: AC | PRN
  Administered 2019-08-04: 100 mL via INTRAVENOUS

## 2019-08-04 MED ORDER — FENTANYL CITRATE (PF) 100 MCG/2ML IJ SOLN
50.0000 ug | Freq: Once | INTRAMUSCULAR | Status: AC
  Administered 2019-08-04: 14:00:00 50 ug via INTRAVENOUS
  Filled 2019-08-04: qty 2

## 2019-08-04 MED ORDER — DICYCLOMINE HCL 20 MG PO TABS: 20.0000 mg | ORAL_TABLET | Freq: Two times a day (BID) | ORAL | 0 refills | Status: DC

## 2019-08-04 NOTE — ED Provider Notes (Signed)
[facility] EMERGENCY DEPARTMENT Provider Note   CSN: [id number] Arrival date & time: 08/04/19  1141     History   Chief Complaint Chief Complaint  Patient presents with  . Abdominal Pain    HPI [name] is a 53 y.o. female.  Past medical history Crohn's disease presents to ER with complaint of new right lower quadrant abdominal pain.  Pain is been going on for the past couple days, steadily worsening, intermittent, primarily dull achy pain but will have intermittent sharp, stabbing pain.  Seems to radiate from right flank to her right lower abdomen.  No associated nausea, no vomiting.  No dysuria or hematuria, no vaginal discharge.  States this feels different than prior Crohn's flares.     HPI  Past Medical History:  Diagnosis Date  . Anxiety   . Arthritis   . Asthma   . Crohn disease ([place])   . Diabetes mellitus    diet controlled  . High cholesterol   . Hypertension    pcp  Dr Coralyn Mark Quillian Quince    [place]  . Sleep apnea 03-2009 study   pt forgot to mention at pre admit visit.  uses c-pap brought her own  machine from home.     Patient Active Problem List   Diagnosis Date Noted  . Trigger thumb, right thumb 04/21/2018  . IBS (irritable bowel syndrome) 06/18/2015  . Obesity 10/08/2014  . Osteoarthritis of right hip 04/20/2012  . Hypertension 10/08/2011  . High cholesterol 10/08/2011  . Diabetes in pregnancy 10/08/2011  . Crohn disease ([place]) 10/08/2011    Past Surgical History:  Procedure Laterality Date  . CHOLECYSTECTOMY N/A 03/31/2013   Procedure: LAPAROSCOPIC CHOLECYSTECTOMY;  Surgeon: Jamesetta So, MD;  Location[facility];  Service: General;  Laterality: N/A;  . COLONOSCOPY N/A 03/16/2013   Procedure: COLONOSCOPY;  Surgeon: Rogene Houston, MD;  [facility];  Service: Endoscopy;  Laterality: N/A;  255  . COLONOSCOPY N/A 12/31/2014   Procedure: COLONOSCOPY;  Surgeon: Rogene Houston, MD;  [facility];  Service: Endoscopy;  Laterality:  N/A;  730  . cyst removed from left hand    . FLEXIBLE SIGMOIDOSCOPY N/A 08/21/2014   Procedure: FLEXIBLE SIGMOIDOSCOPY;  Surgeon: Rogene Houston, MD;  [facility];  Service: Endoscopy;  Laterality: N/A;  1030  . TONSILLECTOMY    . TOTAL HIP ARTHROPLASTY  04/18/2012   Procedure: TOTAL HIP ARTHROPLASTY;  Surgeon: Marybelle Killings, MD;  Location: [facility];  Service: Orthopedics;  Laterality: Right;  Right Total Hip Arthroplasty     OB History   No obstetric history on file.      Home Medications    Prior to Admission medications   Medication Sig Start Date End Date Taking? Authorizing Provider  albuterol (PROVENTIL,VENTOLIN) 90 MCG/ACT inhaler Inhale 2 puffs into the lungs every 6 (six) hours as needed. Shortness of breath   Yes [provider]  celecoxib (CELEBREX) 200 MG capsule Take 200 mg by mouth daily. 07/28/19  Yes [provider]  cetirizine (ZYRTEC) 10 MG tablet Take 10 mg by mouth every morning.    Yes [provider]  citalopram (CELEXA) 20 MG tablet Take 20 mg by mouth every morning.    Yes [provider]  diltiazem (TIAZAC) 360 MG 24 hr capsule Take 360 mg by mouth every morning.    Yes [provider]  DULoxetine (CYMBALTA) 60 MG capsule Take 60 mg by mouth daily. 10/03/18  Yes [provider]  Fluticasone-Salmeterol (ADVAIR) 250-50 MCG/DOSE AEPB Inhale 1 puff into the lungs every morning.    Yes [provider]  gabapentin (NEURONTIN) 100 MG capsule Take 100 mg by mouth daily. 07/31/19  Yes [provider]  JARDIANCE 25 MG TABS tablet Take 25 mg by mouth daily.  09/14/14  Yes [provider]  lisinopril-hydrochlorothiazide (PRINZIDE,ZESTORETIC) 20-12.5 MG per tablet Take 2 tablets by mouth every morning.    Yes [provider]  loperamide (IMODIUM) 2 MG capsule Take 2 mg by mouth as needed for diarrhea or loose stools.    Yes [provider]  Magnesium 400 MG TABS Take 1  tablet by mouth at bedtime.   Yes [provider]  mercaptopurine (PURINETHOL) 50 MG tablet TAKE 1 AND 1/2 TABLETS BY MOUTH DAILY ON EMPTY STOMACH ONE HOUR PRIOR TO MEALS OR TWO HOURS AFTER MEALS. 05/31/19  Yes [name], Mechele Dawley, MD  Multiple Vitamin (MULITIVITAMIN WITH MINERALS) TABS Take 1 tablet by mouth every morning.    Yes [provider]  PENTASA 500 MG CR capsule TAKE TWO (2) CAPSULES BY MOUTH FOUR TIMES DAILY 12/05/18  Yes Setzer, Terri L, NP  rosuvastatin (CRESTOR) 5 MG tablet Take 5 mg by mouth once a week.  05/05/16  Yes [provider]  spironolactone (ALDACTONE) 25 MG tablet Take 25 mg by mouth every morning.    Yes [provider]  traZODone (DESYREL) 50 MG tablet Take 100 mg by mouth at bedtime. 06/26/19  Yes [provider]  dicyclomine (BENTYL) 20 MG tablet TAKE ONE TABLET BY MOUTH THREE TIMES DAILY AS NEEDED FOR SPASMS Patient not taking: Reported on 08/04/2019 03/30/19   Butch Penny, NP    Family History No family history on file.  Social History Social History   Tobacco Use  . Smoking status: Never Smoker  . Smokeless tobacco: Never Used  Substance Use Topics  . Alcohol use: No    Alcohol/week: 0.0 standard drinks  . Drug use: No     Allergies   Remicade [infliximab], Cefprozil, and Penicillins   Review of Systems Review of Systems  Constitutional: Negative for chills and fever.  HENT: Negative for ear pain and sore throat.   Eyes: Negative for pain and visual disturbance.  Respiratory: Negative for cough and shortness of breath.   Cardiovascular: Negative for chest pain and palpitations.  Gastrointestinal: Positive for abdominal pain. Negative for vomiting.  Genitourinary: Negative for dysuria and hematuria.  Musculoskeletal: Negative for arthralgias and back pain.  Skin: Negative for color change and rash.  Neurological: Negative for seizures and syncope.  All other systems reviewed and are negative.     Physical Exam Updated Vital Signs BP (!) 109/45 (BP Location: Right Arm)   Pulse 77   Temp 98.5 F (36.9 C) (Oral)   Resp 16   Ht 5\' 3"  (1.6 m)   Wt 127 kg   SpO2 93%   BMI 49.60 kg/m   Physical Exam Vitals signs and nursing note reviewed.  Constitutional:      General: She is not in acute distress.    Appearance: She is well-developed.  HENT:     Head: Normocephalic and atraumatic.  Eyes:     Conjunctiva/sclera: Conjunctivae normal.  Neck:     Musculoskeletal: Neck supple.  Cardiovascular:     Rate and Rhythm: Normal rate and regular rhythm.     Heart sounds: No murmur.  Pulmonary:     Effort: Pulmonary effort  is normal. No respiratory distress.     Breath sounds: Normal breath sounds.  Abdominal:     Palpations: Abdomen is soft.     Tenderness: There is abdominal tenderness in the right lower quadrant. There is no right CVA tenderness, left CVA tenderness, guarding or rebound.  Skin:    General: Skin is warm and dry.  Neurological:     Mental Status: She is alert.      ED Treatments / Results  Labs (all labs ordered are listed, but only abnormal results are displayed) Labs Reviewed  COMPREHENSIVE METABOLIC PANEL - Abnormal; Notable for the following components:      Result Value   Glucose, Bld 158 (*)    All other components within normal limits  CBC - Abnormal; Notable for the following components:   Hemoglobin 15.4 (*)    HCT 47.2 (*)    MCV 102.2 (*)    All other components within normal limits  URINALYSIS, ROUTINE W REFLEX MICROSCOPIC - Abnormal; Notable for the following components:   Glucose, UA >=500 (*)    Bacteria, UA RARE (*)    All other components within normal limits  LIPASE, BLOOD    EKG None  Radiology No results found.  Procedures Procedures (including critical care time)  Medications Ordered in ED Medications  fentaNYL (SUBLIMAZE) injection 50 mcg (50 mcg Intravenous Given 08/04/19 1336)  iohexol (OMNIPAQUE) 300 MG/ML  solution 100 mL (100 mLs Intravenous Contrast Given 08/04/19 1551)     Initial Impression / Assessment and Plan / ED Course  I have reviewed the triage vital signs and the nursing notes.  Pertinent labs & imaging results that were available during my care of the patient were reviewed by me and considered in my medical decision making (see chart for details).        53 year old lady past medical history of Crohn's disease presents to ER with new onset right lower quadrant abdominal pain.  On exam well-appearing, noted soft abdomen but had some right lower quadrant tenderness.  Labs within normal limits.  CT scan ordered to further evaluate for acute abdominal process.  While awaiting CT, patient signed out to Dr. Sedonia Small.  Final Clinical Impressions(s) / ED Diagnoses   Final diagnoses:  Right lower quadrant abdominal pain  Crohn's disease with complication, unspecified gastrointestinal tract location [facility])    ED Discharge Orders    None       Lucrezia Starch, MD 08/04/19 [phone]

## 2019-08-04 NOTE — ED Provider Notes (Signed)
  Provider Note MRN:  [id number]  Arrival date & time: 08/04/19    ED Course and Medical Decision Making  Assumed care from Dr. Roslynn Amble at shift change.  CT to evaluate for complication of Crohn's flare, if without acute findings appropriate for discharge.  4:30 PM update: CT scan is normal, she is nontoxic and in no acute distress, patient is appropriate for discharge.  Procedures  Final Clinical Impressions(s) / ED Diagnoses     ICD-10-CM   1. Right lower quadrant abdominal pain  R10.31     ED Discharge Orders         Ordered    dicyclomine (BENTYL) 20 MG tablet  2 times daily     08/04/19 1630            Discharge Instructions     You were evaluated in the Emergency Department and after careful evaluation, we did not find any emergent condition requiring admission or further testing in the hospital.  Your exam/testing today was overall reassuring.  Your CT scan today was normal.  We recommend Tylenol or Motrin at home for discomfort.  You can also use the Bentyl medication provided as needed for pain.  Please return to the Emergency Department if you experience any worsening of your condition.  We encourage you to follow up with a primary care provider.  Thank you for allowing [place] to be a part of your care.      Barth Kirks. Sedonia Small, [place] [email or web address]    Maudie Flakes, MD 08/04/19 [phone]

## 2019-08-04 NOTE — Discharge Instructions (Addendum)
You were evaluated in the Emergency Department and after careful evaluation, we did not find any emergent condition requiring admission or further testing in the hospital.  Your exam/testing today was overall reassuring.  Your CT scan today was normal.  We recommend Tylenol or Motrin at home for discomfort.  You can also use the Bentyl medication provided as needed for pain.  Please return to the Emergency Department if you experience any worsening of your condition.  We encourage you to follow up with a primary care provider.  Thank you for allowing [place] to be a part of your care.

## 2019-08-04 NOTE — ED Triage Notes (Signed)
RLQ pain that radiates to RT lower back.

## 2019-08-16 ENCOUNTER — Other Ambulatory Visit: Payer: Self-pay

## 2019-08-17 ENCOUNTER — Ambulatory Visit (INDEPENDENT_AMBULATORY_CARE_PROVIDER_SITE_OTHER): Payer: BC Managed Care – PPO | Admitting: Neurology

## 2019-08-17 DIAGNOSIS — R202 Paresthesia of skin: Secondary | ICD-10-CM

## 2019-08-17 DIAGNOSIS — G5603 Carpal tunnel syndrome, bilateral upper limbs: Secondary | ICD-10-CM

## 2019-08-17 NOTE — Procedures (Signed)
[facility]  [address], [address]  [place], [place] [address] Tel: [phone] Fax:  [phone] Test Date:  08/17/2019  Patient: [name] DOB: 04/19/1966 Physician: Narda Amber, DO  Sex: Female Height: 5' 3"  Ref Phys: Ophelia Charter, MD  ID#: [id number] Temp: 34.0C Technician:    Patient Complaints: This is a 53 year old female referred for evaluation of bilateral hand tingling, worse on the right.  NCV & EMG Findings: Extensive electrodiagnostic testing of the right upper extremity and additional studies of the left shows:  1. Bilateral median sensory responses are prolonged (R5.8, L4.4 ms) and the amplitude is reduced on the right (12.5 V).  Bilateral ulnar sensory responses are within normal limits. 2. Bilateral median motor responses show prolonged latency (R6.3, L4.5 ms) and reduced amplitude (R4.8, L5.1 mV).  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  Bilateral ulnar motor responses are within normal limits.   3. Chronic motor axonal loss changes are seen affecting bilateral abductor pollicis brevis muscles, without accompanied active denervation.    Impression: 1. 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   2. Left median neuropathy at or distal to the wrist (moderate), consistent with a clinical diagnosis of carpal tunnel syndrome.   3. Incidentally, there is evidence of bilateral Martin-Gruber anastomoses, a normal anatomic variant.    ___________________________ Narda Amber, DO    Nerve Conduction Studies Anti Sensory Summary Table   Site NR Peak (ms) Norm Peak (ms) P-T Amp (V) Norm P-T Amp  Left Median Anti Sensory (2nd Digit)  34C  Wrist    4.4 <3.6 18.3 >15  Right Median Anti Sensory (2nd Digit)   34C  Wrist    5.8 <3.6 12.5 >15  Left Ulnar Anti Sensory (5th Digit)  34C  Wrist    2.3 <3.1 23.0 >10  Right Ulnar Anti Sensory (5th Digit)  34C  Wrist    2.4 <3.1 22.9 >10   Motor Summary Table   Site NR Onset (ms) Norm Onset (ms) O-P Amp (mV) Norm O-P Amp Site1 Site2 Delta-0 (ms) Dist (cm) Vel (m/s) Norm Vel (m/s)  Left Median Motor (Abd Poll Brev)  34C  Wrist    4.5 <4.0 5.1 >6 Elbow Wrist 4.5 28.0 62 >50  Elbow    9.0  4.7  Ulnar-wrist crossover Elbow 5.3 0.0    Ulnar-wrist crossover    3.7  2.2         Right Median Motor (Abd Poll Brev)  34C  Wrist    6.3 <4.0 4.8 >6 Elbow Wrist 2.6 28.0 108 >50  Elbow    8.9  4.3  Ulnar-wrist crossover Elbow 5.0 0.0    Ulnar-wrist crossover    3.9  4.5         Left Ulnar Motor (Abd Dig Minimi)  34C  Wrist    2.3 <3.1 12.1 >7 B Elbow Wrist 3.4 22.0 65 >50  B Elbow    5.7  11.9  A Elbow B Elbow 1.6 10.0 63 >50  A Elbow    7.3  11.2         Right Ulnar Motor (Abd Dig Minimi)  34C  Wrist    2.0 <3.1 11.8 >7 B Elbow Wrist 3.5 22.0 63 >50  B Elbow    5.5  10.9  A Elbow B Elbow 1.7 10.0 59 >50  A Elbow    7.2  10.5          EMG   Side Muscle Ins Act Fibs Psw Fasc Number Recrt Dur Dur. Amp Amp. Poly Poly. Comment  Right 1stDorInt Nml Nml Nml Nml Nml Nml Nml Nml Nml Nml Nml Nml N/A  Right Abd Poll Brev Nml Nml Nml Nml 1- Rapid Some 1+ Some 1+ Nml Nml N/A  Right PronatorTeres Nml Nml Nml Nml Nml Nml Nml Nml Nml Nml Nml Nml N/A  Right Biceps Nml Nml Nml Nml Nml Nml Nml Nml Nml Nml Nml Nml N/A  Right Triceps Nml Nml Nml Nml Nml Nml Nml Nml Nml Nml Nml Nml N/A  Right Deltoid Nml Nml Nml Nml Nml Nml Nml Nml Nml Nml Nml Nml N/A  Left 1stDorInt Nml Nml Nml Nml Nml Nml Nml Nml Nml Nml Nml Nml N/A  Left Abd Poll Brev Nml Nml Nml Nml 1- Rapid Few 1+ Few 1+ Nml Nml N/A  Left PronatorTeres Nml Nml Nml Nml Nml Nml Nml Nml Nml Nml Nml Nml N/A      Waveforms:

## 2019-09-13 ENCOUNTER — Encounter (HOSPITAL_COMMUNITY): Payer: Self-pay

## 2019-09-13 NOTE — Patient Instructions (Addendum)
DUE TO COVID-19 ONLY ONE VISITOR IS ALLOWED TO COME WITH YOU AND STAY IN THE WAITING ROOM ONLY DURING PRE OP AND PROCEDURE. THE ONE VISITOR MAY VISIT WITH YOU IN YOUR PRIVATE ROOM DURING VISITING HOURS ONLY!!   COVID SWAB TESTING MUST BE COMPLETED ON: Saturday, Jan. 16, 2021 at 10:55 AM     [address] [place] [facility] enter pre surgical testing line (Must self quarantine after testing. Follow instructions on handout.)             Your procedure is scheduled on: Wednesday, Jan. 20, 2021   Report to [facility] Main  Entrance    Report to admitting at 9:30 AM   Call this number if you have problems the morning of surgery [phone]   Bring CPAP mask and tubing day of surgery   Do not eat food:After Midnight.   May have liquids until 8:30AM day of surgery    CLEAR LIQUID DIET  Foods Allowed                                                                     Foods Excluded  Water, Black Coffee and tea, regular and decaf                             liquids that you cannot  Plain Jell-O in any flavor  (No red)                                           see through such as: Fruit ices (not with fruit pulp)                                     milk, soups, orange juice  Iced Popsicles (No red)                                    All solid food Carbonated beverages, regular and diet                                    Apple juices Sports drinks like Gatorade (No red) Lightly seasoned clear broth or consume(fat free) Sugar, honey syrup  Sample Menu Breakfast                                Lunch                                     Supper Cranberry juice                    Beef broth  Chicken broth Jell-O                                     Grape juice                           Apple juice Coffee or tea                        Jell-O                                      Popsicle                                                 Coffee or tea                        Coffee or tea   Complete one G2 drink the morning of surgery at 8:30 AM the day of surgery.   Brush your teeth the morning of surgery.   Do NOT smoke after Midnight   Take these medicines the morning of surgery with A SIP OF WATER: Diltiazem, Duloxetine, Mercaptopurine, Pentasa   Use Inhaler the morning of surgery   Bring Rescue Inhaler day of surgery   Jan. 19, 2021 Do NOT  Take Jardiance  DO NOT TAKE ANY DIABETIC MEDICATIONS DAY OF YOUR SURGERY                               You may not have any metal on your body including hair pins, jewelry, and body piercings             Do not wear make-up, lotions, powders, perfumes/cologne, or deodorant             Do not wear nail polish.  Do not shave  48 hours prior to surgery.             Do not bring valuables to the hospital. [place].   Contacts, dentures or bridgework may not be worn into surgery.    Patients discharged the day of surgery will not be allowed to drive home.   Special Instructions: Bring a copy of your healthcare power of attorney and living will documents         the day of surgery if you haven't scanned them in before.              Please read over the following fact sheets you were given:Marland Kitchen  How to Manage Your Diabetes Before and After Surgery  Why is it important to control my blood sugar before and after surgery? . Improving blood sugar levels before and after surgery helps healing and can limit problems. . A way of improving blood sugar control is eating a healthy diet by: o  Eating less sugar and carbohydrates o  Increasing activity/exercise o  Talking with your doctor about reaching your blood sugar goals . High blood sugars (  greater than 180 mg/dL) can raise your risk of infections and slow your recovery, so you will need to focus on controlling your diabetes during the weeks before surgery. . Make sure that the  doctor who takes care of your diabetes knows about your planned surgery including the date and location.  How do I manage my blood sugar before surgery? . Check your blood sugar at least 4 times a day, starting 2 days before surgery, to make sure that the level is not too high or low. o Check your blood sugar the morning of your surgery when you wake up and every 2 hours until you get to the Short Stay unit. . If your blood sugar is less than 70 mg/dL, you will need to treat for low blood sugar: o Do not take insulin. o Treat a low blood sugar (less than 70 mg/dL) with  cup of clear juice (cranberry or apple), 4 glucose tablets, OR glucose gel. o Recheck blood sugar in 15 minutes after treatment (to make sure it is greater than 70 mg/dL). If your blood sugar is not greater than 70 mg/dL on recheck, call [phone] for further instructions. . Report your blood sugar to the short stay nurse when you get to Short Stay.  . If you are admitted to the hospital after surgery: o Your blood sugar will be checked by the staff and you will probably be given insulin after surgery (instead of oral diabetes medicines) to make sure you have good blood sugar levels. o The goal for blood sugar control after surgery is 80-180 mg/dL.   WHAT DO I DO ABOUT MY DIABETES MEDICATION?  Marland Kitchen Do not take oral diabetes medicines (pills) the morning of surgery.   Reviewed and Endorsed by [facility] Patient Education Committee, August 2015  [facility] - Preparing for Surgery Before surgery, you can play an important role.  Because skin is not sterile, your skin needs to be as free of germs as possible.  You can reduce the number of germs on your skin by washing with CHG (chlorahexidine gluconate) soap before surgery.  CHG is an antiseptic cleaner which kills germs and bonds with the skin to continue killing germs even after washing. Please DO NOT use if you have an allergy to CHG or antibacterial soaps.  If your skin  becomes reddened/irritated stop using the CHG and inform your nurse when you arrive at Short Stay. Do not shave (including legs and underarms) for at least 48 hours prior to the first CHG shower.  You may shave your face/neck.  Please follow these instructions carefully:  1.  Shower with CHG Soap the night before surgery and the  morning of surgery.  2.  If you choose to wash your hair, wash your hair first as usual with your normal  shampoo.  3.  After you shampoo, rinse your hair and body thoroughly to remove the shampoo.                             4.  Use CHG as you would any other liquid soap.  You can apply chg directly to the skin and wash.  Gently with a scrungie or clean washcloth.  5.  Apply the CHG Soap to your body ONLY FROM THE NECK DOWN.   Do   not use on face/ open  Wound or open sores. Avoid contact with eyes, ears mouth and   genitals (private parts).                       Wash face,  Genitals (private parts) with your normal soap.             6.  Wash thoroughly, paying special attention to the area where your    surgery  will be performed.  7.  Thoroughly rinse your body with warm water from the neck down.  8.  DO NOT shower/wash with your normal soap after using and rinsing off the CHG Soap.                9.  Pat yourself dry with a clean towel.            10.  Wear clean pajamas.            11.  Place clean sheets on your bed the night of your first shower and do not  sleep with pets. Day of Surgery : Do not apply any lotions/deodorants the morning of surgery.  Please wear clean clothes to the hospital/surgery center.  FAILURE TO FOLLOW THESE INSTRUCTIONS MAY RESULT IN THE CANCELLATION OF YOUR SURGERY  PATIENT SIGNATURE_________________________________  NURSE SIGNATURE__________________________________  ________________________________________________________________________   [name]  An incentive spirometer is a tool that  can help keep your lungs clear and active. This tool measures how well you are filling your lungs with each breath. Taking long deep breaths may help reverse or decrease the chance of developing breathing (pulmonary) problems (especially infection) following:  A long period of time when you are unable to move or be active. BEFORE THE PROCEDURE   If the spirometer includes an indicator to show your best effort, your nurse or respiratory therapist will set it to a desired goal.  If possible, sit up straight or lean slightly forward. Try not to slouch.  Hold the incentive spirometer in an upright position. INSTRUCTIONS FOR USE  1. Sit on the edge of your bed if possible, or sit up as far as you can in bed or on a chair. 2. Hold the incentive spirometer in an upright position. 3. Breathe out normally. 4. Place the mouthpiece in your mouth and seal your lips tightly around it. 5. Breathe in slowly and as deeply as possible, raising the piston or the ball toward the top of the column. 6. Hold your breath for 3-5 seconds or for as long as possible. Allow the piston or ball to fall to the bottom of the column. 7. Remove the mouthpiece from your mouth and breathe out normally. 8. Rest for a few seconds and repeat Steps 1 through 7 at least 10 times every 1-2 hours when you are awake. Take your time and take a few normal breaths between deep breaths. 9. The spirometer may include an indicator to show your best effort. Use the indicator as a goal to work toward during each repetition. 10. After each set of 10 deep breaths, practice coughing to be sure your lungs are clear. If you have an incision (the cut made at the time of surgery), support your incision when coughing by placing a pillow or rolled up towels firmly against it. Once you are able to get out of bed, walk around indoors and cough well. You may stop using the incentive spirometer when instructed by your caregiver.  RISKS AND  COMPLICATIONS  Take your  time so you do not get dizzy or light-headed.  If you are in pain, you may need to take or ask for pain medication before doing incentive spirometry. It is harder to take a deep breath if you are having pain. AFTER USE  Rest and breathe slowly and easily.  It can be helpful to keep track of a log of your progress. Your caregiver can provide you with a simple table to help with this. If you are using the spirometer at home, follow these instructions: [place] IF:   You are having difficultly using the spirometer.  You have trouble using the spirometer as often as instructed.  Your pain medication is not giving enough relief while using the spirometer.  You develop fever of 100.5 F (38.1 C) or higher. SEEK IMMEDIATE MEDICAL CARE IF:   You cough up bloody sputum that had not been present before.  You develop fever of 102 F (38.9 C) or greater.  You develop worsening pain at or near the incision site. MAKE SURE YOU:   Understand these instructions.  Will watch your condition.  Will get help right away if you are not doing well or get worse. Document Released: 12/28/2006 Document Revised: 11/09/2011 Document Reviewed: 02/28/2007 [facility] Patient Information 2014 [place], [place].   ________________________________________________________________________

## 2019-09-14 NOTE — H&P (Signed)
PREOPERATIVE H&P  Chief Complaint: right wrist carpal tunnel syndrome  HPI: [name] is a 54 y.o. female who is scheduled for CARPAL TUNNEL RELEASE.   Patient has a past medical history significant for HTN, sleep apnea, IBS, high cholesterol, diabetes, Crohn's disease, and asthma.   This is a 54 year old who has been dealing with carpal tunnel syndrome for awhile.  She has tried bracing, medications and activity modification, but has not made much progress.  She is diabetic, but her hemoglobin A1C is 6.2 and well controlled.  She has had this going on for years.  She has been getting worse over the last few months.  Right hand is much worse than her left.    Her symptoms are rated as moderate to severe, and have been worsening.  This is significantly impairing activities of daily living.    Please see clinic note for further details on this patient's care.    She has elected for surgical management.   Past Medical History:  Diagnosis Date  . Anxiety   . Arthritis   . Asthma   . Bilateral carpal tunnel syndrome   . Crohn disease ([place])   . Diabetes mellitus    diet controlled  . High cholesterol   . Hypertension    pcp  Dr Coralyn Mark Quillian Qu[place]    [place]  . IBS (irritable bowel syndrome)   . Obesity   . Sleep apnea 03-2009 study   pt forgot to mention at pre admit visit.  uses c-pap brought her own  machine from home.   . Trigger thumb, right thumb    Past Surgical History:  Procedure Laterality Date  . CHOLECYSTECTOMY N/A 03/31/2013   Procedure: LAPAROSCOPIC CHOLECYSTECTOMY;  Surgeon: Jamesetta So, MD;  Location: [facility] OR[facility];  Service: General;  Laterality: N/A;  . COLONOSCOPY N/A 03/16/2013   Procedure: COLONOSCOPY;  Surgeon: Rogene Houston, MD;  Location: [facility] ENDO SUITE;  Service: Endoscopy;  Laterality: N/A;  255  . COLONOSCOPY N/A 12/31/2014   Procedure: COLONOSCOPY;  Surgeon: Rogene Houston, MD;  Location: [facility];  Service: Endoscopy;  Laterality: N/A;  730  . cyst  removed from left hand    . FLEXIBLE SIGMOIDOSCOPY N/A 08/21/2014   Procedure: FLEXIBLE SIGMOIDOSCOPY;  Surgeon: Rogene Houston, MD;  Location: [facility];  Service: Endoscopy;  Laterality: N/A;  1030  . TONSILLECTOMY    . TOTAL HIP ARTHROPLASTY  04/18/2012   Procedure: TOTAL HIP ARTHROPLASTY;  Surgeon: Marybelle Killings, MD;  Location: [place];  Service: Orthopedics;  Laterality: Right;  Right Total Hip Arthroplasty   Social History   Socioeconomic History  . Marital status: Married    Spouse name: Not on file  . Number of children: Not on file  . Years of education: Not on file  . Highest education level: Not on file  Occupational History  . Not on file  Tobacco Use  . Smoking status: Never Smoker  . Smokeless tobacco: Never Used  Substance and Sexual Activity  . Alcohol use: No    Alcohol/week: 0.0 standard drinks  . Drug use: No  . Sexual activity: Yes    Birth control/protection: Pill  Other Topics Concern  . Not on file  Social History Narrative  . Not on file   Social Determinants of Health   Financial Resource Strain:   . Difficulty of Paying Living Expenses: Not on file  Food Insecurity:   . Worried About Charity fundraiser  in the Last Year: Not on file  . Ran Out of Food in the Last Year: Not on file  Transportation Needs:   . Lack of Transportation (Medical): Not on file  . Lack of Transportation (Non-Medical): Not on file  Physical Activity:   . Days of Exercise per Week: Not on file  . Minutes of Exercise per Session: Not on file  Stress:   . Feeling of Stress : Not on file  Social Connections:   . Frequency of Communication with Friends and Family: Not on file  . Frequency of Social Gatherings with Friends and Family: Not on file  . Attends Religious Services: Not on file  . Active Member of Clubs or Organizations: Not on file  . Attends Archivist Meetings: Not on file  . Marital Status: Not on file   No family history on file. Allergies    Allergen Reactions  . Remicade [Infliximab] Swelling    Joints locked up  . Cefprozil Rash  . Penicillins Rash    Did it involve swelling of the face/tongue/throat, SOB, or low BP? No Did it involve sudden or severe rash/hives, skin peeling, or any reaction on the inside of your mouth or nose? No Did you need to seek medical attention at a hospital or doctor's office? No When did it last happen? If all above answers are "NO", may proceed with cephalosporin use.    Prior to Admission medications   Medication Sig Start Date End Date Taking? Authorizing Provider  albuterol (PROVENTIL,VENTOLIN) 90 MCG/ACT inhaler Inhale 2 puffs into the lungs every 6 (six) hours as needed. Shortness of breath   Yes [provider]  Calcium Carb-Cholecalciferol (CALCIUM 600 + D PO) Take 1 tablet by mouth daily.   Yes [provider]  celecoxib (CELEBREX) 200 MG capsule Take 200 mg by mouth daily. 07/28/19  Yes [provider]  cetirizine (ZYRTEC) 10 MG tablet Take 10 mg by mouth every morning.    Yes [provider]  dicyclomine (BENTYL) 20 MG tablet TAKE ONE TABLET BY MOUTH THREE TIMES DAILY AS NEEDED FOR SPASMS Patient taking differently: Take 20 mg by mouth 3 (three) times daily as needed for spasms.  03/30/19  Yes Setzer, Rona Ravens, NP  diltiazem (TIAZAC) 360 MG 24 hr capsule Take 360 mg by mouth every morning.    Yes [provider]  DULoxetine (CYMBALTA) 60 MG capsule Take 60 mg by mouth daily. 10/03/18  Yes [provider]  Fluticasone-Salmeterol (ADVAIR) 250-50 MCG/DOSE AEPB Inhale 1 puff into the lungs every morning.    Yes [provider]  gabapentin (NEURONTIN) 100 MG capsule Take 100 mg by mouth at bedtime.  07/31/19  Yes [provider]  JARDIANCE 25 MG TABS tablet Take 25 mg by mouth daily.  09/14/14  Yes [provider]  lisinopril-hydrochlorothiazide (PRINZIDE,ZESTORETIC) 20-12.5 MG per tablet Take 2 tablets by  mouth every morning.    Yes [provider]  Magnesium 400 MG TABS Take 400 mg by mouth at bedtime.    Yes [provider]  mercaptopurine (PURINETHOL) 50 MG tablet TAKE 1 AND 1/2 TABLETS BY MOUTH DAILY ON EMPTY STOMACH ONE HOUR PRIOR TO MEALS OR TWO HOURS AFTER MEALS. Patient taking differently: Take 75 mg by mouth daily.  05/31/19  Yes [name], Mechele Dawley, MD  Multiple Vitamin (MULITIVITAMIN WITH MINERALS) TABS Take 1 tablet by mouth every morning.    Yes [provider]  PENTASA 500 MG CR capsule TAKE TWO (  2) CAPSULES BY MOUTH FOUR TIMES DAILY Patient taking differently: Take 1,000 mg by mouth 4 (four) times daily.  12/05/18  Yes Setzer, Terri L, NP  rosuvastatin (CRESTOR) 5 MG tablet Take 5 mg by mouth once a week.  05/05/16  Yes [provider]  spironolactone (ALDACTONE) 25 MG tablet Take 25 mg by mouth every morning.    Yes [provider]  traZODone (DESYREL) 50 MG tablet Take 100 mg by mouth at bedtime. 06/26/19  Yes [provider]  dicyclomine (BENTYL) 20 MG tablet Take 1 tablet (20 mg total) by mouth 2 (two) times daily. Patient not taking: Reported on 09/13/2019 08/04/19   Maudie Flakes, MD    ROS: All other systems have been reviewed and were otherwise negative with the exception of those mentioned in the HPI and as above.  Physical Exam: General: Alert, no acute distress Cardiovascular: No pedal edema Respiratory: No cyanosis, no use of accessory musculature GI: No organomegaly, abdomen is soft and non-tender Skin: No lesions in the area of chief complaint Neurologic: Sensation intact distally Psychiatric: Patient is competent for consent with normal mood and affect Lymphatic: No axillary or cervical lymphadenopathy  MUSCULOSKELETAL:  Right wrist: positive Tinel's and Phalen's at the carpal tunnel.  Negative provocative findings at the cubital tunnel.  Mild tenderness to palpation at the right [facility] joint.  Assessment: right  wrist carpal tunnel syndrome  Plan: Plan for Procedure(s): CARPAL TUNNEL RELEASE  The risks benefits and alternatives were discussed with the patient including but not limited to the risks of nonoperative treatment, versus surgical intervention including infection, bleeding, nerve injury,  blood clots, cardiopulmonary complications, morbidity, mortality, among others, and they were willing to proceed.   The patient acknowledged the explanation, agreed to proceed with the plan and consent was signed.   Operative Plan: right carpal tunnel release Discharge Medications: Tylenol, Celebrex, Oxycodone, Zofran  DVT Prophylaxis: None   Ethelda Chick, PA-C  09/14/2019 3:00 PM

## 2019-09-16 ENCOUNTER — Other Ambulatory Visit (HOSPITAL_COMMUNITY)
Admission: RE | Admit: 2019-09-16 | Discharge: 2019-09-16 | Disposition: A | Discharge status: Home / Self Care | Payer: BC Managed Care – PPO | Source: Ambulatory Visit | Attending: Orthopaedic Surgery | Admitting: Orthopaedic Surgery

## 2019-09-16 DIAGNOSIS — Z20822 Contact with and (suspected) exposure to covid-19: Secondary | ICD-10-CM | POA: Diagnosis not present

## 2019-09-16 DIAGNOSIS — Z01812 Encounter for preprocedural laboratory examination: Secondary | ICD-10-CM | POA: Insufficient documentation

## 2019-09-17 LAB — NOVEL CORONAVIRUS, NAA (HOSP ORDER, SEND-OUT TO REF LAB; TAT 18-24 HRS): SARS-CoV-2, NAA: NOT DETECTED

## 2019-09-18 ENCOUNTER — Other Ambulatory Visit: Payer: Self-pay

## 2019-09-18 ENCOUNTER — Encounter (HOSPITAL_COMMUNITY)
Admission: RE | Admit: 2019-09-18 | Discharge: 2019-09-18 | Disposition: A | Discharge status: Home / Self Care | Payer: BC Managed Care – PPO | Source: Ambulatory Visit | Attending: Orthopaedic Surgery | Admitting: Orthopaedic Surgery

## 2019-09-18 ENCOUNTER — Encounter (HOSPITAL_COMMUNITY): Payer: Self-pay

## 2019-09-18 DIAGNOSIS — Z01818 Encounter for other preprocedural examination: Secondary | ICD-10-CM | POA: Insufficient documentation

## 2019-09-18 HISTORY — DX: Obesity, unspecified: E66.9

## 2019-09-18 HISTORY — DX: Irritable bowel syndrome, unspecified: K58.9

## 2019-09-18 HISTORY — DX: Trigger thumb, right thumb: M65.311

## 2019-09-18 HISTORY — DX: Carpal tunnel syndrome, bilateral upper limbs: G56.03

## 2019-09-18 HISTORY — DX: Cardiac murmur, unspecified: R01.1

## 2019-09-18 LAB — CBC
HCT: 45.9 % (ref 36.0–46.0)
Hemoglobin: 15 g/dL (ref 12.0–15.0)
MCH: 33.6 pg (ref 26.0–34.0)
MCHC: 32.7 g/dL (ref 30.0–36.0)
MCV: 102.7 fL — ABNORMAL HIGH (ref 80.0–100.0)
Platelets: 294 10*3/uL (ref 150–400)
RBC: 4.47 MIL/uL (ref 3.87–5.11)
RDW: 12.1 % (ref 11.5–15.5)
WBC: 8.2 10*3/uL (ref 4.0–10.5)
nRBC: 0 % (ref 0.0–0.2)

## 2019-09-18 LAB — BASIC METABOLIC PANEL
Anion gap: 8 (ref 5–15)
BUN: 16 mg/dL (ref 6–20)
CO2: 29 mmol/L (ref 22–32)
Calcium: 9.4 mg/dL (ref 8.9–10.3)
Chloride: 100 mmol/L (ref 98–111)
Creatinine, Ser: 0.79 mg/dL (ref 0.44–1.00)
GFR calc Af Amer: >60 mL/min (ref >60)
GFR calc non Af Amer: >60 mL/min (ref >60)
Glucose, Bld: 110 mg/dL — ABNORMAL HIGH (ref 70–99)
Potassium: 4.3 mmol/L (ref 3.5–5.1)
Sodium: 137 mmol/L (ref 135–145)

## 2019-09-18 LAB — HEMOGLOBIN A1C
Hgb A1c MFr Bld: 6.3 % — ABNORMAL HIGH (ref 4.8–5.6)
Mean Plasma Glucose: 134.11 mg/dL

## 2019-09-18 LAB — GLUCOSE, CAPILLARY: Glucose-Capillary: 108 mg/dL — ABNORMAL HIGH (ref 70–99)

## 2019-09-18 NOTE — Progress Notes (Signed)
PCP - Dr. Dub Amis Cardiologist - N/A  Chest x-ray - N/A EKG - N/A Stress Test - N/A ECHO - [facility] Cardiac Cath - N/A  Sleep Study - Yes CPAP - Yes  Fasting Blood Sugar - 120-125 Checks Blood Sugar __3___ times a week  Blood Thinner Instructions: N/A Aspirin Instructions: N/A Last Dose: N/A  Anesthesia review: OSA, DM, HTN  Patient denies shortness of breath, fever, cough and chest pain at PAT appointment   Patient verbalized understanding of instructions that were given to them at the PAT appointment. Patient was also instructed that they will need to review over the PAT instructions again at home before surgery.

## 2019-09-19 ENCOUNTER — Encounter (HOSPITAL_COMMUNITY): Payer: Self-pay | Admitting: Orthopaedic Surgery

## 2019-09-19 MED ORDER — DEXTROSE 5 % IV SOLN
3.0000 g | INTRAVENOUS | Status: AC
  Administered 2019-09-20: 3 g via INTRAVENOUS
  Filled 2019-09-19: qty 3

## 2019-09-19 NOTE — Progress Notes (Signed)
Pt aware of surgical time change for 09/20/2019. Pt to arrive at [facility] admitting at 1115 am; aware of no food after midnight; clear liquids till 1015 and consume G2 at 1015 then nothing by mouth.

## 2019-09-20 ENCOUNTER — Encounter (HOSPITAL_COMMUNITY)
Admission: RE | Disposition: A | Discharge status: Home / Self Care | Payer: Self-pay | Source: Home / Self Care | Attending: Orthopaedic Surgery

## 2019-09-20 ENCOUNTER — Ambulatory Visit (HOSPITAL_COMMUNITY)
Admission: RE | Admit: 2019-09-20 | Discharge: 2019-09-20 | Disposition: A | Discharge status: Home / Self Care | Payer: BC Managed Care – PPO | Attending: Orthopaedic Surgery | Admitting: Orthopaedic Surgery

## 2019-09-20 ENCOUNTER — Ambulatory Visit (HOSPITAL_COMMUNITY): Payer: BC Managed Care – PPO | Admitting: Physician Assistant

## 2019-09-20 ENCOUNTER — Encounter (HOSPITAL_COMMUNITY): Payer: Self-pay | Admitting: Orthopaedic Surgery

## 2019-09-20 ENCOUNTER — Other Ambulatory Visit: Payer: Self-pay

## 2019-09-20 DIAGNOSIS — K5090 Crohn's disease, unspecified, without complications: Secondary | ICD-10-CM | POA: Diagnosis not present

## 2019-09-20 DIAGNOSIS — Z880 Allergy status to penicillin: Secondary | ICD-10-CM | POA: Insufficient documentation

## 2019-09-20 DIAGNOSIS — E7800 Pure hypercholesterolemia, unspecified: Secondary | ICD-10-CM | POA: Diagnosis not present

## 2019-09-20 DIAGNOSIS — Z888 Allergy status to other drugs, medicaments and biological substances status: Secondary | ICD-10-CM | POA: Insufficient documentation

## 2019-09-20 DIAGNOSIS — M1990 Unspecified osteoarthritis, unspecified site: Secondary | ICD-10-CM | POA: Diagnosis not present

## 2019-09-20 DIAGNOSIS — E669 Obesity, unspecified: Secondary | ICD-10-CM | POA: Insufficient documentation

## 2019-09-20 DIAGNOSIS — Z791 Long term (current) use of non-steroidal anti-inflammatories (NSAID): Secondary | ICD-10-CM | POA: Diagnosis not present

## 2019-09-20 DIAGNOSIS — G4730 Sleep apnea, unspecified: Secondary | ICD-10-CM | POA: Insufficient documentation

## 2019-09-20 DIAGNOSIS — G5601 Carpal tunnel syndrome, right upper limb: Secondary | ICD-10-CM | POA: Insufficient documentation

## 2019-09-20 DIAGNOSIS — F419 Anxiety disorder, unspecified: Secondary | ICD-10-CM | POA: Diagnosis not present

## 2019-09-20 DIAGNOSIS — Z7951 Long term (current) use of inhaled steroids: Secondary | ICD-10-CM | POA: Diagnosis not present

## 2019-09-20 DIAGNOSIS — Z6841 Body Mass Index (BMI) 40.0 and over, adult: Secondary | ICD-10-CM | POA: Insufficient documentation

## 2019-09-20 DIAGNOSIS — E119 Type 2 diabetes mellitus without complications: Secondary | ICD-10-CM | POA: Diagnosis not present

## 2019-09-20 DIAGNOSIS — Z96641 Presence of right artificial hip joint: Secondary | ICD-10-CM | POA: Insufficient documentation

## 2019-09-20 DIAGNOSIS — J45909 Unspecified asthma, uncomplicated: Secondary | ICD-10-CM | POA: Diagnosis not present

## 2019-09-20 DIAGNOSIS — I10 Essential (primary) hypertension: Secondary | ICD-10-CM | POA: Diagnosis not present

## 2019-09-20 DIAGNOSIS — Z9049 Acquired absence of other specified parts of digestive tract: Secondary | ICD-10-CM | POA: Diagnosis not present

## 2019-09-20 DIAGNOSIS — Z79899 Other long term (current) drug therapy: Secondary | ICD-10-CM | POA: Diagnosis not present

## 2019-09-20 HISTORY — PX: CARPAL TUNNEL RELEASE: SHX101

## 2019-09-20 LAB — GLUCOSE, CAPILLARY
Glucose-Capillary: 123 mg/dL — ABNORMAL HIGH (ref 70–99)
Glucose-Capillary: 141 mg/dL — ABNORMAL HIGH (ref 70–99)

## 2019-09-20 SURGERY — CARPAL TUNNEL RELEASE
Anesthesia: General | Laterality: Right

## 2019-09-20 MED ORDER — MIDAZOLAM HCL 5 MG/5ML IJ SOLN
INTRAMUSCULAR | Status: DC | PRN
  Administered 2019-09-20: 2 mg via INTRAVENOUS

## 2019-09-20 MED ORDER — BUPIVACAINE HCL (PF) 0.25 % IJ SOLN
INTRAMUSCULAR | Status: DC | PRN
  Administered 2019-09-20: 7 mL

## 2019-09-20 MED ORDER — 0.9 % SODIUM CHLORIDE (POUR BTL) OPTIME
TOPICAL | Status: DC | PRN
  Administered 2019-09-20: 1000 mL

## 2019-09-20 MED ORDER — PHENYLEPHRINE 40 MCG/ML (10ML) SYRINGE FOR IV PUSH (FOR BLOOD PRESSURE SUPPORT)
PREFILLED_SYRINGE | INTRAVENOUS | Status: AC
  Filled 2019-09-20: qty 10

## 2019-09-20 MED ORDER — CHLORHEXIDINE GLUCONATE 4 % EX LIQD: 60.0000 mL | Freq: Once | CUTANEOUS | Status: DC

## 2019-09-20 MED ORDER — STERILE WATER FOR IRRIGATION IR SOLN
Status: DC | PRN
  Administered 2019-09-20: 1000 mL

## 2019-09-20 MED ORDER — FENTANYL CITRATE (PF) 100 MCG/2ML IJ SOLN: 25.0000 ug | INTRAMUSCULAR | Status: DC | PRN

## 2019-09-20 MED ORDER — DEXMEDETOMIDINE HCL IN NACL 200 MCG/50ML IV SOLN
INTRAVENOUS | Status: DC | PRN
  Administered 2019-09-20 (×2): 4 ug via INTRAVENOUS

## 2019-09-20 MED ORDER — PROPOFOL 10 MG/ML IV BOLUS
INTRAVENOUS | Status: AC
  Filled 2019-09-20: qty 20

## 2019-09-20 MED ORDER — BUPIVACAINE HCL (PF) 0.25 % IJ SOLN
INTRAMUSCULAR | Status: AC
  Filled 2019-09-20: qty 30

## 2019-09-20 MED ORDER — PROPOFOL 10 MG/ML IV BOLUS
INTRAVENOUS | Status: DC | PRN
  Administered 2019-09-20: 200 mg via INTRAVENOUS

## 2019-09-20 MED ORDER — CEFAZOLIN SODIUM-DEXTROSE 2-4 GM/100ML-% IV SOLN
INTRAVENOUS | Status: AC
  Filled 2019-09-20: qty 200

## 2019-09-20 MED ORDER — DEXAMETHASONE SODIUM PHOSPHATE 4 MG/ML IJ SOLN
INTRAMUSCULAR | Status: DC | PRN
  Administered 2019-09-20: 10 mg via INTRAVENOUS

## 2019-09-20 MED ORDER — FENTANYL CITRATE (PF) 100 MCG/2ML IJ SOLN
INTRAMUSCULAR | Status: AC
  Filled 2019-09-20: qty 2

## 2019-09-20 MED ORDER — ONDANSETRON HCL 4 MG PO TABS: 4.0000 mg | ORAL_TABLET | Freq: Three times a day (TID) | ORAL | 1 refills | Status: AC | PRN

## 2019-09-20 MED ORDER — OXYCODONE HCL 5 MG PO TABS: ORAL_TABLET | ORAL | 0 refills | Status: AC

## 2019-09-20 MED ORDER — MIDAZOLAM HCL 2 MG/2ML IJ SOLN
INTRAMUSCULAR | Status: AC
  Filled 2019-09-20: qty 2

## 2019-09-20 MED ORDER — DEXMEDETOMIDINE HCL IN NACL 200 MCG/50ML IV SOLN
INTRAVENOUS | Status: AC
  Filled 2019-09-20: qty 50

## 2019-09-20 MED ORDER — LIDOCAINE HCL (CARDIAC) PF 100 MG/5ML IV SOSY
PREFILLED_SYRINGE | INTRAVENOUS | Status: DC | PRN
  Administered 2019-09-20: 100 mg via INTRAVENOUS

## 2019-09-20 MED ORDER — PHENYLEPHRINE HCL (PRESSORS) 10 MG/ML IV SOLN
INTRAVENOUS | Status: DC | PRN
  Administered 2019-09-20 (×4): 80 ug via INTRAVENOUS

## 2019-09-20 MED ORDER — CELECOXIB 200 MG PO CAPS: 200.0000 mg | ORAL_CAPSULE | Freq: Two times a day (BID) | ORAL | 1 refills | Status: AC

## 2019-09-20 MED ORDER — FENTANYL CITRATE (PF) 100 MCG/2ML IJ SOLN
INTRAMUSCULAR | Status: DC | PRN
  Administered 2019-09-20: 25 ug via INTRAVENOUS
  Administered 2019-09-20: 50 ug via INTRAVENOUS
  Administered 2019-09-20: 25 ug via INTRAVENOUS

## 2019-09-20 MED ORDER — ONDANSETRON HCL 4 MG/2ML IJ SOLN
INTRAMUSCULAR | Status: DC | PRN
  Administered 2019-09-20: 4 mg via INTRAVENOUS

## 2019-09-20 MED ORDER — ACETAMINOPHEN 500 MG PO TABS: 1000.0000 mg | ORAL_TABLET | Freq: Three times a day (TID) | ORAL | 0 refills | Status: AC

## 2019-09-20 MED ORDER — LACTATED RINGERS IV SOLN: INTRAVENOUS | Status: DC

## 2019-09-20 MED ORDER — PROMETHAZINE HCL 25 MG/ML IJ SOLN: 6.2500 mg | INTRAMUSCULAR | Status: DC | PRN

## 2019-09-20 MED ORDER — VANCOMYCIN HCL 1000 MG IV SOLR
INTRAVENOUS | Status: AC
  Filled 2019-09-20: qty 1000

## 2019-09-20 MED ORDER — VANCOMYCIN HCL 1000 MG IV SOLR
INTRAVENOUS | Status: DC | PRN
  Administered 2019-09-20: 1000 mg via TOPICAL

## 2019-09-20 SURGICAL SUPPLY — 54 items
APL SKNCLS STERI-STRIP NONHPOA (GAUZE/BANDAGES/DRESSINGS) ×1
BENZOIN TINCTURE PRP APPL 2/3 (GAUZE/BANDAGES/DRESSINGS) ×2 IMPLANT
BLADE SURG 15 STRL LF DISP TIS (BLADE) ×2 IMPLANT
BLADE SURG 15 STRL SS (BLADE)
BNDG CMPR 9X4 STRL LF SNTH (GAUZE/BANDAGES/DRESSINGS) ×1
BNDG COHESIVE 4X5 TAN STRL (GAUZE/BANDAGES/DRESSINGS) ×2 IMPLANT
BNDG ELASTIC 3X5.8 VLCR STR LF (GAUZE/BANDAGES/DRESSINGS) ×1 IMPLANT
BNDG ELASTIC 4X5.8 VLCR STR LF (GAUZE/BANDAGES/DRESSINGS) ×1 IMPLANT
BNDG ESMARK 4X9 LF (GAUZE/BANDAGES/DRESSINGS) ×2 IMPLANT
CLSR STERI-STRIP ANTIMIC 1/2X4 (GAUZE/BANDAGES/DRESSINGS) IMPLANT
CORD BIPOLAR FORCEPS 12FT (ELECTRODE) ×2 IMPLANT
COVER BACK TABLE 60X90IN (DRAPES) IMPLANT
COVER MAYO STAND STRL (DRAPES) ×2 IMPLANT
COVER WAND RF STERILE (DRAPES) IMPLANT
CUFF TOURN SGL QUICK 18X4 (TOURNIQUET CUFF) ×1 IMPLANT
DECANTER SPIKE VIAL GLASS SM (MISCELLANEOUS) IMPLANT
DRAPE EXTREMITY T 121X128X90 (DISPOSABLE) ×2 IMPLANT
DRAPE IMP U-DRAPE 54X76 (DRAPES) ×2 IMPLANT
DRAPE SURG 17X23 STRL (DRAPES) ×2 IMPLANT
ELECT REM PT RETURN 15FT ADLT (MISCELLANEOUS) ×2 IMPLANT
GAUZE SPONGE 4X4 12PLY STRL (GAUZE/BANDAGES/DRESSINGS) ×2 IMPLANT
GAUZE XEROFORM 1X8 LF (GAUZE/BANDAGES/DRESSINGS) ×2 IMPLANT
GLOVE BIO SURGEON STRL SZ 6.5 (GLOVE) ×2 IMPLANT
GLOVE BIOGEL PI IND STRL 6.5 (GLOVE) ×1 IMPLANT
GLOVE BIOGEL PI IND STRL 8 (GLOVE) ×1 IMPLANT
GLOVE BIOGEL PI INDICATOR 6.5 (GLOVE) ×1
GLOVE BIOGEL PI INDICATOR 8 (GLOVE) ×1
GLOVE ECLIPSE 8.0 STRL XLNG CF (GLOVE) ×2 IMPLANT
GOWN STRL REUS W/ TWL LRG LVL3 (GOWN DISPOSABLE) ×1 IMPLANT
GOWN STRL REUS W/TWL LRG LVL3 (GOWN DISPOSABLE)
GOWN STRL REUS W/TWL XL LVL3 (GOWN DISPOSABLE) ×2 IMPLANT
KIT BASIN OR (CUSTOM PROCEDURE TRAY) ×2 IMPLANT
NS IRRIG 1000ML POUR BTL (IV SOLUTION) ×1 IMPLANT
PACK ORTHO EXTREMITY (CUSTOM PROCEDURE TRAY) ×2 IMPLANT
PAD CAST 4YDX4 CTTN HI CHSV (CAST SUPPLIES) ×1 IMPLANT
PADDING CAST COTTON 4X4 STRL (CAST SUPPLIES) ×4
PENCIL SMOKE EVACUATOR (MISCELLANEOUS) ×2 IMPLANT
SLEEVE SCD COMPRESS KNEE MED (MISCELLANEOUS) IMPLANT
SLING ARM FOAM STRAP LRG (SOFTGOODS) ×2 IMPLANT
SPLINT PLASTER EXTRA FAST 3X15 (CAST SUPPLIES) ×1
SPLINT PLASTER GYPS XFAST 3X15 (CAST SUPPLIES) IMPLANT
STOCKINETTE 4X48 STRL (DRAPES) ×2 IMPLANT
SUCTION FRAZIER HANDLE 10FR (MISCELLANEOUS) ×1
SUCTION TUBE FRAZIER 10FR DISP (MISCELLANEOUS) ×1 IMPLANT
SUT ETHILON 3 0 PS 1 (SUTURE) ×1 IMPLANT
SUT MNCRL AB 3-0 PS2 18 (SUTURE) IMPLANT
SUT MNCRL AB 4-0 PS2 18 (SUTURE) ×2 IMPLANT
SUT PROLENE 3 0 PS 2 (SUTURE) IMPLANT
SUT VIC AB 3-0 SH 27 (SUTURE) ×2
SUT VIC AB 3-0 SH 27X BRD (SUTURE) ×1 IMPLANT
SYR BULB 3OZ (MISCELLANEOUS) ×2 IMPLANT
TOWEL OR 17X26 10 PK STRL BLUE (TOWEL DISPOSABLE) ×2 IMPLANT
TUBE CONNECTING 20X1/4 (TUBING) ×2 IMPLANT
YANKAUER SUCT BULB TIP NO VENT (SUCTIONS) ×1 IMPLANT

## 2019-09-20 NOTE — Interval H&P Note (Signed)
Discuss care with patient at length. All questions answered. We will proceed.

## 2019-09-20 NOTE — Anesthesia Preprocedure Evaluation (Signed)
Anesthesia Evaluation  Patient identified by MRN, date of birth, ID band Patient awake    Reviewed: Allergy & Precautions, NPO status , Patient's Chart, lab work & pertinent test results  Airway Mallampati: III  TM Distance: <3 FB Neck ROM: Full    Dental no notable dental hx.    Pulmonary sleep apnea and Continuous Positive Airway Pressure Ventilation ,    Pulmonary exam normal breath sounds clear to auscultation       Cardiovascular hypertension, negative cardio ROS Normal cardiovascular exam Rhythm:Regular Rate:Normal     Neuro/Psych negative neurological ROS  negative psych ROS   GI/Hepatic negative GI ROS, Neg liver ROS,   Endo/Other  diabetesMorbid obesity  Renal/GU negative Renal ROS  negative genitourinary   Musculoskeletal negative musculoskeletal ROS (+)   Abdominal   Peds negative pediatric ROS (+)  Hematology negative hematology ROS (+)   Anesthesia Other Findings   Reproductive/Obstetrics negative OB ROS                             Anesthesia Physical Anesthesia Plan  ASA: III  Anesthesia Plan: General   Post-op Pain Management:    Induction: Intravenous  PONV Risk Score and Plan: 3 and Ondansetron, Dexamethasone and Treatment may vary due to age or medical condition  Airway Management Planned: LMA  Additional Equipment:   Intra-op Plan:   Post-operative Plan: Extubation in OR  Informed Consent: I have reviewed the patients History and Physical, chart, labs and discussed the procedure including the risks, benefits and alternatives for the proposed anesthesia with the patient or authorized representative who has indicated his/her understanding and acceptance.     Dental advisory given  Plan Discussed with: CRNA and Surgeon  Anesthesia Plan Comments:         Anesthesia Quick Evaluation

## 2019-09-20 NOTE — Op Note (Signed)
Orthopaedic Surgery Operative Note (CSN: [id number])  AZA DANTES  September 13, 1965 Date of Surgery: 09/20/2019   Diagnoses:  right wrist carpal tunnel syndrome  Procedure: Right open carpal tunnel release   Operative Finding Successful completion of the planned procedure.  Complete release, nerve intact at end of case  Post-operative plan: The patient will be nwb in splint.  The patient will be dc home.  DVT prophylaxis not indicated in this ambulatory upper extremity patient without significant risk factors.   Pain control with PRN pain medication preferring oral medicines.  Follow up plan will be scheduled in approximately 7 days for incision check and XR.  Post-Op Diagnosis: Same Surgeons:Primary: Hiram Gash, MD Assistants:Caroline McBane PA-C Location: [facility] 05 Anesthesia: General with local anesthesia Antibiotics: Ancef 2 g with local vancomycin powder 1 g at the surgical site Tourniquet time: 15 Estimated Blood Loss: Minimal Complications: None Specimens: None Implants: * No implants in log *  Indications for Surgery:   IQRA ROTUNDO is a 54 y.o. female with EMG confirmed carpal tunnel syndrome and continued pain failing non-op management.  Benefits and risks of operative and nonoperative management were discussed prior to surgery with patient/guardian(s) and informed consent form was completed.  Specific risks including infection, need for additional surgery, nerve or vessel injury, continued pain.   Procedure:   The patient was identified properly. Informed consent was obtained and the surgical site was marked. The patient was taken up to suite where general anesthesia was induced.  The patient was positioned supine on hand table.  The right hand was prepped and draped in the usual sterile fashion.  Timeout was performed before the beginning of the case.  Tourniquet was used for the above duration.  Incision was made in line with the radial border of the  ring finger. The carpal tunnel transverse fascia was identified, cleaned, and incised sharply. The common sensory branches were visualized along with the superficial palmar arch and protected.  The median nerve was protected below. Deep retractors were placed underneath the transverse carpal ligament, protecting the nerve. I released the ligament completely, and then released the proximal distal volar forearm fascia. The nerve was identified, and visualized, and protected throughout the case. No masses or abnormalities were identified in ulnar bursa.  The wounds were irrigated copiously, and the wounds injected, and the skin closed with absorbable sutures followed by a volar splint and sterile gauze. Patient  tolerated this well, with no complications.   We irrigated the wound copiously before placing local antibiotic as listed above.  Close the incision in a multilayer fashion with absorbable suture.  Sterile dressing was placed.  Small demi splint placed.  Patient was awoken taken to PACU in stable condition.  Noemi Chapel, PA-C, present and scrubbed throughout the case, critical for completion in a timely fashion, and for retraction, instrumentation, closure.

## 2019-09-20 NOTE — Transfer of Care (Signed)
Immediate Anesthesia Transfer of Care Note  Patient: [name]  Procedure(s) Performed: Procedure(s): CARPAL TUNNEL RELEASE (Right)  Patient Location: PACU  Anesthesia Type:General  Level of Consciousness:  sedated, patient cooperative and responds to stimulation  Airway & Oxygen Therapy:Patient Spontanous Breathing and Patient connected to face mask oxgen  Post-op Assessment:  Report given to PACU RN and Post -op Vital signs reviewed and stable  Post vital signs:  Reviewed and stable  Last Vitals:  Vitals:   09/20/19 1201 09/20/19 1354  BP: 140/75 135/68  Pulse: 100 (!) 106  Resp: 18 (!) 26  Temp: 37.2 C (P) 37.1 C  SpO2: 16% 109%    Complications: No apparent anesthesia complications

## 2019-09-20 NOTE — Anesthesia Procedure Notes (Signed)
Procedure Name: LMA Insertion Date/Time: 09/20/2019 1:00 PM Performed by: Lavina Hamman, CRNA Pre-anesthesia Checklist: Patient identified, Emergency Drugs available, Suction available and Patient being monitored Patient Re-evaluated:Patient Re-evaluated prior to induction Oxygen Delivery Method: Circle System Utilized Preoxygenation: Pre-oxygenation with 100% oxygen Induction Type: IV induction Ventilation: Mask ventilation without difficulty LMA: LMA inserted LMA Size: 4.0 Number of attempts: 1 Airway Equipment and Method: Bite block Placement Confirmation: positive ETCO2 Tube secured with: Tape Dental Injury: Teeth and Oropharynx as per pre-operative assessment

## 2019-09-21 ENCOUNTER — Encounter: Payer: Self-pay | Admitting: *Deleted

## 2019-09-21 NOTE — Anesthesia Postprocedure Evaluation (Signed)
Anesthesia Post Note  Patient: [name]  Procedure(s) Performed: CARPAL TUNNEL RELEASE (Right )     Patient location during evaluation: PACU Anesthesia Type: General Level of consciousness: awake and alert Pain management: pain level controlled Vital Signs Assessment: post-procedure vital signs reviewed and stable Respiratory status: spontaneous breathing, nonlabored ventilation, respiratory function stable and patient connected to nasal cannula oxygen Cardiovascular status: blood pressure returned to baseline and stable Postop Assessment: no apparent nausea or vomiting Anesthetic complications: no    Last Vitals:  Vitals:   09/20/19 1430 09/20/19 1445  BP: (!) 122/57 132/66  Pulse:  93  Resp: (!) 22 20  Temp: 36.7 C 36.7 C  SpO2:  96%    Last Pain:  Vitals:   09/20/19 1445  TempSrc:   PainSc: 2                  Radames Mejorado S

## 2019-10-04 ENCOUNTER — Ambulatory Visit (INDEPENDENT_AMBULATORY_CARE_PROVIDER_SITE_OTHER): Payer: BC Managed Care – PPO | Admitting: Gastroenterology

## 2019-10-04 ENCOUNTER — Other Ambulatory Visit: Payer: Self-pay

## 2019-10-04 ENCOUNTER — Encounter (INDEPENDENT_AMBULATORY_CARE_PROVIDER_SITE_OTHER): Payer: Self-pay | Admitting: Gastroenterology

## 2019-10-04 VITALS — BP 108/61 | HR 97 | Temp 97.8°F | Ht 63.0 in | Wt 288.0 lb

## 2019-10-04 DIAGNOSIS — K5010 Crohn's disease of large intestine without complications: Secondary | ICD-10-CM | POA: Diagnosis not present

## 2019-10-04 NOTE — Progress Notes (Signed)
Patient profile: [name] is a 54 y.o. female seen for follow-up of Crohn's disease.  Last seen March 2020.  History of Present Illness: [name] is seen today for follow-up of Crohn's disease.  She has a long history of approximately 22 years of Crohn's disease.  She is currently on 6-MP and Pentasa.  In 2014 after colonoscopy noted to be in remission she came off 6-MP but had relapse of colitis and was restarted.  Symptomatically she reports she is feeling symptoms are at her baseline.  Most days she has 2-3 bowel movements a day that are loose and 6 on the Bristol stool scale.  She does have some urgency at baseline.  She does have "bad days" approximately once a month where she will have to stop and rest the remainder of the day and will have multiple stools with mild abdominal pain.  She has not seen any rectal bleeding in several years.  She takes Imodium each morning and dicyclomine each day routinely.  On bad days she uses dicyclomine additional doses as needed which helps.  She has very occasional GERD symptoms after spicy foods and takes Tums as needed.  She denies any nausea, vomiting, dysphagia.  Appetite good weight stable.  She takes Celebrex every morning, she does take NSAIDs once a week at bedtime.  Takes magnesium chronically for leg pain  Denies any alcohol or tobacco intake.  Denies any family history of colon polyps colon cancer or IBD.  Wt Readings from Last 3 Encounters:  10/04/19 288 lb (130.6 kg)  09/20/19 286 lb (129.7 kg)  09/18/19 283 lb (128.4 kg)   05/2018-#284   Last Colonoscopy: 12/2014-Prep satisfactory. Focal erythema edema and erosions noted at ileocecal valve. Focal erythema edema and erosions noted admitted sigmoid colon. Few small hyperplastic appearing polyps noted at distal transverse colon and these were left alone. Mucosa of descending colon was normal. Patchy edema erythema and erosions noted involving mucosa of distal sigmoid  colon. Diffuse changes of erythema friable mucosa and multiple erosions involving rectal mucosa. Soft sentinel's skin tags noted on digital exam.  Path with chronic active colitis on all 3 biopsies.  Past Medical History:  Past Medical History:  Diagnosis Date  . Anxiety    No medications currently  . Arthritis   . Asthma   . Bilateral carpal tunnel syndrome   . Crohn disease ([place])   . Diabetes mellitus    diet controlled  . Heart murmur   . High cholesterol   . Hypertension    pcp  Dr Coralyn Mark Quillian Quince    [place]  . IBS (irritable bowel syndrome)   . Obesity   . Pneumonia    History of  . Sleep apnea 03-2009 study   pt forgot to mention at pre admit visit.  uses c-pap brought her own  machine from home.   . Trigger thumb, right thumb     Problem List: Patient Active Problem List   Diagnosis Date Noted  . Trigger thumb, right thumb 04/21/2018  . IBS (irritable bowel syndrome) 06/18/2015  . Obesity 10/08/2014  . Osteoarthritis of right hip 04/20/2012  . Hypertension 10/08/2011  . High cholesterol 10/08/2011  . Diabetes in pregnancy 10/08/2011  . Crohn disease ([place]) 10/08/2011    Past Surgical History: Past Surgical History:  Procedure Laterality Date  . CARPAL TUNNEL RELEASE Right 09/20/2019   Procedure: CARPAL TUNNEL RELEASE;  Surgeon: Hiram Gash, MD;  [facility]: [facility];  Service: Orthopedics;  Laterality: Right;  . CHOLECYSTECTOMY N/A 03/31/2013   Procedure: LAPAROSCOPIC CHOLECYSTECTOMY;  Surgeon: Jamesetta So, MD;  Location: [facility];  Service: General;  Laterality: N/A;  . COLONOSCOPY N/A 03/16/2013   Procedure: COLONOSCOPY;  Surgeon: Rogene Houston, MD;  [facility];  Service: Endoscopy;  Laterality: N/A;  255  . COLONOSCOPY N/A 12/31/2014   Procedure: COLONOSCOPY;  Surgeon: Rogene Houston, MD;  [facility];  Service: Endoscopy;  Laterality: N/A;  730  . cyst removed from left hand    . FLEXIBLE SIGMOIDOSCOPY N/A 08/21/2014   Procedure: FLEXIBLE  SIGMOIDOSCOPY;  Surgeon: Rogene Houston, MD;  [facility];  Service: Endoscopy;  Laterality: N/A;  1030  . TONSILLECTOMY    . TOTAL HIP ARTHROPLASTY  04/18/2012   Procedure: TOTAL HIP ARTHROPLASTY;  Surgeon: Marybelle Killings, MD;  Location: [facility];  Service: Orthopedics;  Laterality: Right;  Right Total Hip Arthroplasty    Allergies: Allergies  Allergen Reactions  . Remicade [Infliximab] Swelling    Joints locked up  . Cefprozil Rash  . Penicillins Rash    Did it involve swelling of the face/tongue/throat, SOB, or low BP? No Did it involve sudden or severe rash/hives, skin peeling, or any reaction on the inside of your mouth or nose? No Did you need to seek medical attention at a hospital or doctor's office? No When did it last happen? If all above answers are "NO", may proceed with cephalosporin use.       Home Medications:  Current Outpatient Medications:  .  albuterol (PROVENTIL,VENTOLIN) 90 MCG/ACT inhaler, Inhale 2 puffs into the lungs every 6 (six) hours as needed. Shortness of breath, Disp: , Rfl:  .  Calcium Carb-Cholecalciferol (CALCIUM 600 + D PO), Take 1 tablet by mouth daily., Disp: , Rfl:  .  celecoxib (CELEBREX) 200 MG capsule, Take 1 capsule (200 mg total) by mouth 2 (two) times daily., Disp: 60 capsule, Rfl: 1 .  cetirizine (ZYRTEC) 10 MG tablet, Take 10 mg by mouth every evening. , Disp: , Rfl:  .  dicyclomine (BENTYL) 20 MG tablet, TAKE ONE TABLET BY MOUTH THREE TIMES DAILY AS NEEDED FOR SPASMS (Patient taking differently: Take 20 mg by mouth 3 (three) times daily as needed for spasms. ), Disp: 90 tablet, Rfl: 5 .  diltiazem (TIAZAC) 360 MG 24 hr capsule, Take 360 mg by mouth every morning. , Disp: , Rfl:  .  DULoxetine (CYMBALTA) 60 MG capsule, Take 60 mg by mouth daily., Disp: , Rfl:  .  Fluticasone-Salmeterol (ADVAIR) 250-50 MCG/DOSE AEPB, Inhale 1 puff into the lungs every morning. , Disp: , Rfl:  .  gabapentin (NEURONTIN) 100 MG capsule, Take 100 mg  by mouth at bedtime. , Disp: , Rfl:  .  JARDIANCE 25 MG TABS tablet, Take 25 mg by mouth daily. , Disp: , Rfl:  .  lisinopril-hydrochlorothiazide (PRINZIDE,ZESTORETIC) 20-12.5 MG per tablet, Take 2 tablets by mouth every morning. , Disp: , Rfl:  .  loperamide (IMODIUM) 2 MG capsule, Take 2 mg by mouth as needed for diarrhea or loose stools. Patient states that she takes 1 by mouth daily and depending on her day she will take another in the evening., Disp: , Rfl:  .  Magnesium 400 MG TABS, Take 400 mg by mouth at bedtime. , Disp: , Rfl:  .  mercaptopurine (PURINETHOL) 50 MG tablet, TAKE 1 AND 1/2 TABLETS BY MOUTH DAILY ON EMPTY STOMACH ONE HOUR PRIOR TO MEALS OR  TWO HOURS AFTER MEALS. (Patient taking differently: Take 75 mg by mouth daily. ), Disp: 45 tablet, Rfl: 5 .  Multiple Vitamin (MULITIVITAMIN WITH MINERALS) TABS, Take 1 tablet by mouth every morning. , Disp: , Rfl:  .  PENTASA 500 MG CR capsule, TAKE TWO (2) CAPSULES BY MOUTH FOUR TIMES DAILY (Patient taking differently: Take 1,000 mg by mouth 4 (four) times daily. ), Disp: 240 capsule, Rfl: 11 .  rosuvastatin (CRESTOR) 5 MG tablet, Take 5 mg by mouth once a week. , Disp: , Rfl:  .  spironolactone (ALDACTONE) 25 MG tablet, Take 25 mg by mouth every morning. , Disp: , Rfl:  .  traZODone (DESYREL) 50 MG tablet, Take 100 mg by mouth at bedtime., Disp: , Rfl:  .  acetaminophen (TYLENOL) 500 MG tablet, Take 2 tablets (1,000 mg total) by mouth every 8 (eight) hours for 14 days. (Patient not taking: Reported on 10/04/2019), Disp: 84 tablet, Rfl: 0   Family History: family history is not on file.    Social History:   reports that she has never smoked. She has never used smokeless tobacco. She reports that she does not drink alcohol or use drugs.   Review of Systems: Constitutional: Denies weight loss/weight gain  Eyes: No changes in vision. ENT: No oral lesions, sore throat.  GI: see HPI.  Heme/Lymph: No easy bruising.  CV: No chest pain.  GU:  No hematuria.  Integumentary: No rashes.  Neuro: No headaches.  Psych: No depression/anxiety.  Endocrine: No heat/cold intolerance.  Allergic/Immunologic: No urticaria.  Resp: No cough, SOB.  Musculoskeletal: No joint swelling.    Physical Examination: BP 108/61 (BP Location: Right Arm, Patient Position: Sitting, Cuff Size: Large)   Pulse 97   Temp 97.8 F (36.6 C) (Temporal)   Ht _0  (1.6 m)   Wt 288 lb (130.6 kg)   BMI 51.02 kg/m  Gen: NAD, alert and oriented x 4 HEENT: PEERLA, EOMI, Neck: supple, no JVD Chest: CTA bilaterally, no wheezes, crackles, or other adventitious sounds CV: RRR, no m/g/c/r Abd: soft, NT, ND, +BS in all four quadrants; no HSM, guarding, ridigity, or rebound tenderness Ext: no edema, well perfused with 2+ pulses, Skin: no rash or lesions noted on observed skin Lymph: no noted LAD  Data Reviewed:     Assessment/Plan: Ms. Mangione is a 54 y.o. female   1.  Crohn's disease-symptomatically at baseline with some intermittent breakthrough diarrhea but fairly controllable with use of Imodium and dicyclomine.  She will continue her current regimen of 6-MP 75 mg and Pentasa, she has had higher doses of 6-MP which caused stomatitis.  She has questions about trying to wean off 6-MP but I have encouraged against this given her current symptoms and history of relapse with stopping.  We will plan for colonoscopy this summer (last 5 years ago) to restage disease and discuss future long-term treatment plan.  She will continue current medicines today.  Checking labs as below.  2.  High-risk medication-MCV noted to be elevated to 102 on recent CBC.  Otherwise labs  normal.  Will check B12 folate.  Also check a vitamin D given her history of Crohn's disease.  Her liver enzymes were normal in December 2020, will be due for repeat this summer.  She does take NSAIDs daily and did recommend she take these with food when needed.  Follow-up 6 months-call sooner with any  questions or worsening symptoms  Jaden was seen today for follow-up.  Diagnoses and all orders  for this visit:  Crohn's disease of colon without complication ([place]) -     C-reactive protein -     Sed Rate (ESR) -     B12 and Folate Panel -     Vitamin D (25 hydroxy)        I personally performed the service, non-incident to. (WP)  Laurine Blazer, [facility]

## 2019-10-04 NOTE — Patient Instructions (Signed)
Make sure if taking ibuprofen or advil to take w/ food. Try to limit caffeine, this can worsen diarrhea in some patients. We will call w/ lab results.   Follow up in 6 months - will discuss colonoscopy at that time

## 2019-10-05 ENCOUNTER — Other Ambulatory Visit (INDEPENDENT_AMBULATORY_CARE_PROVIDER_SITE_OTHER): Payer: Self-pay | Admitting: Gastroenterology

## 2019-10-05 LAB — C-REACTIVE PROTEIN: CRP: 6.3 mg/L (ref <8.0)

## 2019-10-05 LAB — B12 AND FOLATE PANEL
Folate: >24 ng/mL
Vitamin B-12: 670 pg/mL (ref 200–1100)

## 2019-10-05 LAB — SEDIMENTATION RATE: Sed Rate: 33 mm/h — ABNORMAL HIGH (ref 0–30)

## 2019-10-05 LAB — VITAMIN D 25 HYDROXY (VIT D DEFICIENCY, FRACTURES): Vit D, 25-Hydroxy: 23 ng/mL — ABNORMAL LOW (ref 30–100)

## 2019-10-05 MED ORDER — VITAMIN D 25 MCG (1000 UNIT) PO TABS: 1000.0000 [IU] | ORAL_TABLET | Freq: Every day | ORAL | 6 refills | Status: DC

## 2019-10-05 NOTE — Progress Notes (Signed)
I discussed labs with patient via phone.  Her vitamin D was low.  Supplement sent to pharmacy, if insurance would not cover can buy OTC.

## 2019-10-12 ENCOUNTER — Ambulatory Visit: Payer: Self-pay

## 2019-10-12 ENCOUNTER — Encounter: Payer: Self-pay | Admitting: Orthopaedic Surgery

## 2019-10-12 ENCOUNTER — Ambulatory Visit (INDEPENDENT_AMBULATORY_CARE_PROVIDER_SITE_OTHER): Payer: BC Managed Care – PPO | Admitting: Orthopaedic Surgery

## 2019-10-12 VITALS — BP 110/63 | HR 105 | Ht 63.0 in | Wt 280.0 lb

## 2019-10-12 DIAGNOSIS — M7061 Trochanteric bursitis, right hip: Secondary | ICD-10-CM

## 2019-10-12 DIAGNOSIS — M25551 Pain in right hip: Secondary | ICD-10-CM | POA: Diagnosis not present

## 2019-10-12 DIAGNOSIS — Z96641 Presence of right artificial hip joint: Secondary | ICD-10-CM

## 2019-10-12 NOTE — Progress Notes (Signed)
Office Visit Note   Patient: [name]           Date of Birth: December 24, 1965           MRN: [id number] Visit Date: 10/12/2019              Requested by: Caryl Bis, MD [place],  [place] [id number] PCP: Caryl Bis, MD   Assessment & Plan: Visit Diagnoses:  1. Pain in right hip   2. Trochanteric bursitis, right hip   3. History of total hip arthroplasty, right     Plan: good relief post troch injection. retrun PRN  Follow-Up Instructions: No follow-ups on file.   Orders:  Orders Placed This Encounter  Procedures  . Large Joint Inj: R greater trochanter  . XR HIP UNILAT W OR W/O PELVIS 2-3 VIEWS RIGHT  . XR Lumbar Spine 2-3 Views   No orders of the defined types were placed in this encounter.     Procedures: Large Joint Inj: R greater trochanter on 10/12/2019 2:07 PM Details: lateral approach      Clinical Data: No additional findings.   Subjective: Chief Complaint  Patient presents with  . Right Hip - Pain    HPI 54 year old female seen with the right hip pain is been present for a few weeks.  She recently did some moving.  She had some pain for a few months but got worse recently.  She has been amatory with a cane in the left hand which helps take some of the pressure off.  She has some pain in her buttocks some over the SI joint and more laterally over the trochanter.  No pain numbness or tingling radiates down her leg to her foot.  No bowel bladder symptoms she denies chills or fever.  Review of Systems use systems updated unchanged from last office visit other than recent carpal tunnel release on the right hand.  Right total hip arthroplasty by Dr. Lorin Mercy 2013.  Positive for diabetes Crohn's disease obesity.   Objective: Vital Signs: BP 110/63   Pulse (!) 105   Ht 5' 3"  (1.6 m)   Wt 280 lb (127 kg)   BMI 49.60 kg/m   Physical Exam Constitutional:      Appearance: She is well-developed.  HENT:     Head: Normocephalic.   Right Ear: External ear normal.     Left Ear: External ear normal.  Eyes:     Pupils: Pupils are equal, round, and reactive to light.  Neck:     Thyroid: No thyromegaly.     Trachea: No tracheal deviation.  Cardiovascular:     Rate and Rhythm: Normal rate.  Pulmonary:     Effort: Pulmonary effort is normal.  Abdominal:     Palpations: Abdomen is soft.  Skin:    General: Skin is warm and dry.  Neurological:     Mental Status: She is alert and oriented to person, place, and time.  Psychiatric:        Behavior: Behavior normal.     Ortho Exam patient has some discomfort logroll the hip she points laterally over the trochanter.  Tenderness over the right greater trochanter with palpation some over the sciatic notch negative straight leg raising 90 degrees.  Opposite logroll left hip is normal.  Anterior tib gastrocsoleus ankle dorsiflexion plantarflexion is normal.  Specialty Comments:  No specialty comments available.  Imaging: No results found.   PMFS History: Patient Active Problem List  Diagnosis Date Noted  . History of total hip arthroplasty, right 10/12/2019  . Trochanteric bursitis, right hip 10/12/2019  . Trigger thumb, right thumb 04/21/2018  . IBS (irritable bowel syndrome) 06/18/2015  . Obesity 10/08/2014  . Osteoarthritis of right hip 04/20/2012  . Hypertension 10/08/2011  . High cholesterol 10/08/2011  . Diabetes in pregnancy 10/08/2011  . Crohn disease ([place]) 10/08/2011   Past Medical History:  Diagnosis Date  . Anxiety    No medications currently  . Arthritis   . Asthma   . Bilateral carpal tunnel syndrome   . Crohn disease ([place])   . Diabetes mellitus    diet controlled  . Heart murmur   . High cholesterol   . Hypertension    pcp  Dr Coralyn Saleen Peden Quillian Quince    [place]  . IBS (irritable bowel syndrome)   . Obesity   . Pneumonia    History of  . Sleep apnea 03-2009 study   pt forgot to mention at pre admit visit.  uses c-pap brought her own  machine from  home.   . Trigger thumb, right thumb     No family history on file.  Past Surgical History:  Procedure Laterality Date  . CARPAL TUNNEL RELEASE Right 09/20/2019   Procedure: CARPAL TUNNEL RELEASE;  Surgeon: Hiram Gash, MD;  [facility];  Service: Orthopedics;  Laterality: Right;  . CHOLECYSTECTOMY N/A 03/31/2013   Procedure: LAPAROSCOPIC CHOLECYSTECTOMY;  Surgeon: Jamesetta So, MD;  Location: [facility];  Service: General;  Laterality: N/A;  . COLONOSCOPY N/A 03/16/2013   Procedure: COLONOSCOPY;  Surgeon: Rogene Houston, MD;  [facility];  Service: Endoscopy;  Laterality: N/A;  255  . COLONOSCOPY N/A 12/31/2014   Procedure: COLONOSCOPY;  Surgeon: Rogene Houston, MD;  [facility];  Service: Endoscopy;  Laterality: N/A;  730  . cyst removed from left hand    . FLEXIBLE SIGMOIDOSCOPY N/A 08/21/2014   Procedure: FLEXIBLE SIGMOIDOSCOPY;  Surgeon: Rogene Houston, MD;  [facility];  Service: Endoscopy;  Laterality: N/A;  1030  . TONSILLECTOMY    . TOTAL HIP ARTHROPLASTY  04/18/2012   Procedure: TOTAL HIP ARTHROPLASTY;  Surgeon: Marybelle Killings, MD;  Location: [facility];  Service: Orthopedics;  Laterality: Right;  Right Total Hip Arthroplasty   Social History   Occupational History  . Not on file  Tobacco Use  . Smoking status: Never Smoker  . Smokeless tobacco: Never Used  Substance and Sexual Activity  . Alcohol use: No    Alcohol/week: 0.0 standard drinks  . Drug use: No  . Sexual activity: Yes    Birth control/protection: Post-menopausal

## 2019-11-06 NOTE — Patient Instructions (Signed)
DUE TO COVID-19 ONLY ONE VISITOR IS ALLOWED TO COME WITH YOU AND STAY IN THE WAITING ROOM ONLY DURING PRE OP AND PROCEDURE DAY OF SURGERY. THE 1 VISITOR MAY VISIT WITH YOU AFTER SURGERY IN YOUR PRIVATE ROOM DURING VISITING HOURS ONLY!  YOU NEED TO HAVE A COVID 19 TEST ON_3/13/21______ @__10 :15_____, THIS TEST MUST BE DONE BEFORE SURGERY, COME  [place] , [id number].  ([place]) ONCE YOUR COVID TEST IS COMPLETED, PLEASE BEGIN THE QUARANTINE INSTRUCTIONS AS OUTLINED IN YOUR HANDOUT.                [name]    Your procedure is scheduled on: 11/15/19   Report to [facility] Main  Entrance   Report to admitting at 11:35 AM     Call this number if you have problems the morning of surgery [place], NO CHEWING GUM [place].   Do not eat food After Midnight.   YOU MAY HAVE CLEAR LIQUIDS FROM MIDNIGHT UNTIL 10:30 AM.   At 10:30 AM Please finish the prescribed Pre-Surgery Gatorade drink  . Nothing by mouth after you finish the Gatorade drink !    Take these medicines the morning of surgery with A SIP OF WATER: Cymbalta, Trazodone, Mercaptopurine,Diltiazem.  Use your inhalers and bring them with you to the hospital.  DO NOT TAKE ANY DIABETIC MEDICATIONS DAY OF YOUR SURGERY   How to Manage Your Diabetes Before and After Surgery  Why is it important to control my blood sugar before and after surgery? . Improving blood sugar levels before and after surgery helps healing and can limit problems. . A way of improving blood sugar control is eating a healthy diet by: o  Eating less sugar and carbohydrates o  Increasing activity/exercise o  Talking with your doctor about reaching your blood sugar goals . High blood sugars (greater than 180 mg/dL) can raise your risk of infections and slow your recovery, so you will need to focus on controlling your diabetes during the  weeks before surgery. . Make sure that the doctor who takes care of your diabetes knows about your planned surgery including the date and location.  How do I manage my blood sugar before surgery? . Check your blood sugar at least 4 times a day, starting 2 days before surgery, to make sure that the level is not too high or low. o Check your blood sugar the morning of your surgery when you wake up and every 2 hours until you get to the Short Stay unit. . If your blood sugar is less than 70 mg/dL, you will need to treat for low blood sugar: o Do not take insulin. o Treat a low blood sugar (less than 70 mg/dL) with  cup of clear juice (cranberry or apple), 4 glucose tablets, OR glucose gel. o Recheck blood sugar in 15 minutes after treatment (to make sure it is greater than 70 mg/dL). If your blood sugar is not greater than 70 mg/dL on recheck, call [phone] for further instructions. . Report your blood sugar to the short stay nurse when you get to Short Stay.  . If you are admitted to the hospital after surgery: o Your blood sugar will be checked by the staff and you will probably be given insulin after surgery (instead of oral diabetes medicines) to make sure you have good blood sugar levels.  o The goal for blood sugar control after surgery is 80-180 mg/dL.   WHAT DO I DO ABOUT MY DIABETES MEDICATION?  Marland Kitchen Do not take oral diabetes medicines (pills) the morning of surgery.                          You may not have any metal on your body including hair pins and              piercings  Do not wear jewelry, make-up, lotions, powders or perfumes, deodorant             Do not wear nail polish on your fingernails.  Do not shave  48 hours prior to surgery.             Do not bring valuables to the hospital. [place].  Contacts, dentures or bridgework may not be worn into surgery.       Patients discharged the day of surgery will not be  allowed to drive home  . IF YOU ARE HAVING SURGERY AND GOING HOME THE SAME DAY, YOU MUST HAVE AN ADULT TO DRIVE YOU HOME AND BE WITH YOU FOR 24 HOURS.   YOU MAY GO HOME BY TAXI OR UBER OR ORTHERWISE, BUT AN ADULT MUST ACCOMPANY YOU HOME AND STAY WITH YOU FOR 24 HOURS.  Name and phone number of your driver:  Special Instructions: N/A              Please read over the following fact sheets you were given: _____________________________________________________________________             [facility] - Preparing for Surgery  Before surgery, you can play an important role.   Because skin is not sterile, your skin needs to be as free of germs as possible.   You can reduce the number of germs on your skin by washing with CHG (chlorahexidine gluconate) soap before surgery .  CHG is an antiseptic cleaner which kills germs and bonds with the skin to continue killing germs even after washing. Please DO NOT use if you have an allergy to CHG or antibacterial soaps.   If your skin becomes reddened/irritated stop using the CHG and inform your nurse when you arrive at Short Stay. Do not shave (including legs and underarms) for at least 48 hours prior to the first CHG shower.  Please follow these instructions carefully:  1.  Shower with CHG Soap the night before surgery and the  morning of Surgery.  2.  If you choose to wash your hair, wash your hair first as usual with your  normal  shampoo.  3.  After you shampoo, rinse your hair and body thoroughly to remove the  shampoo.                                        4.  Use CHG as you would any other liquid soap.  You can apply chg directly  to the skin and wash                       Gently with a scrungie or clean washcloth.  5.  Apply the CHG Soap to your body ONLY FROM THE NECK DOWN.  Do not use on face/ open                           Wound or open sores. Avoid contact with eyes, ears mouth and genitals (private parts).                       Wash  face,  Genitals (private parts) with your normal soap.             6.  Wash thoroughly, paying special attention to the area where your surgery  will be performed.  7.  Thoroughly rinse your body with warm water from the neck down.  8.  DO NOT shower/wash with your normal soap after using and rinsing off  the CHG Soap.             9.  Pat yourself dry with a clean towel.            10.  Wear clean pajamas.            11.  Place clean sheets on your bed the night of your first shower and do not  sleep with pets. Day of Surgery : Do not apply any lotions/deodorants the morning of surgery.  Please wear clean clothes to the hospital/surgery center.  FAILURE TO FOLLOW THESE INSTRUCTIONS MAY RESULT IN THE CANCELLATION OF YOUR SURGERY PATIENT SIGNATURE_________________________________  NURSE SIGNATURE__________________________________  ________________________________________________________________________   Adam Phenix  An incentive spirometer is a tool that can help keep your lungs clear and active. This tool measures how well you are filling your lungs with each breath. Taking long deep breaths may help reverse or decrease the chance of developing breathing (pulmonary) problems (especially infection) following:  A long period of time when you are unable to move or be active. BEFORE THE PROCEDURE   If the spirometer includes an indicator to show your best effort, your nurse or respiratory therapist will set it to a desired goal.  If possible, sit up straight or lean slightly forward. Try not to slouch.  Hold the incentive spirometer in an upright position. INSTRUCTIONS FOR USE  1. Sit on the edge of your bed if possible, or sit up as far as you can in bed or on a chair. 2. Hold the incentive spirometer in an upright position. 3. Breathe out normally. 4. Place the mouthpiece in your mouth and seal your lips tightly around it. 5. Breathe in slowly and as deeply as possible,  raising the piston or the ball toward the top of the column. 6. Hold your breath for 3-5 seconds or for as long as possible. Allow the piston or ball to fall to the bottom of the column. 7. Remove the mouthpiece from your mouth and breathe out normally. 8. Rest for a few seconds and repeat Steps 1 through 7 at least 10 times every 1-2 hours when you are awake. Take your time and take a few normal breaths between deep breaths. 9. The spirometer may include an indicator to show your best effort. Use the indicator as a goal to work toward during each repetition. 10. After each set of 10 deep breaths, practice coughing to be sure your lungs are clear. If you have an incision (the cut made at the time of surgery), support your incision when coughing by placing a pillow or rolled up towels firmly against it. Once you are able to get out of bed, walk  around indoors and cough well. You may stop using the incentive spirometer when instructed by your caregiver.  RISKS AND COMPLICATIONS  Take your time so you do not get dizzy or light-headed.  If you are in pain, you may need to take or ask for pain medication before doing incentive spirometry. It is harder to take a deep breath if you are having pain. AFTER USE  Rest and breathe slowly and easily.  It can be helpful to keep track of a log of your progress. Your caregiver can provide you with a simple table to help with this. If you are using the spirometer at home, follow these instructions: [place] IF:   You are having difficultly using the spirometer.  You have trouble using the spirometer as often as instructed.  Your pain medication is not giving enough relief while using the spirometer.  You develop fever of 100.5 F (38.1 C) or higher. SEEK IMMEDIATE MEDICAL CARE IF:   You cough up bloody sputum that had not been present before.  You develop fever of 102 F (38.9 C) or greater.  You develop worsening pain at or near the  incision site. MAKE SURE YOU:   Understand these instructions.  Will watch your condition.  Will get help right away if you are not doing well or get worse. Document Released: 12/28/2006 Document Revised: 11/09/2011 Document Reviewed: 02/28/2007 [facility] Patient Information 2014 [place], [place].   ________________________________________________________________________

## 2019-11-08 ENCOUNTER — Encounter (HOSPITAL_COMMUNITY)
Admission: RE | Admit: 2019-11-08 | Discharge: 2019-11-08 | Disposition: A | Discharge status: Home / Self Care | Payer: BC Managed Care – PPO | Source: Ambulatory Visit | Attending: Orthopaedic Surgery | Admitting: Orthopaedic Surgery

## 2019-11-08 ENCOUNTER — Encounter (HOSPITAL_COMMUNITY): Payer: Self-pay

## 2019-11-08 ENCOUNTER — Other Ambulatory Visit: Payer: Self-pay

## 2019-11-08 DIAGNOSIS — Z01812 Encounter for preprocedural laboratory examination: Secondary | ICD-10-CM | POA: Diagnosis not present

## 2019-11-08 LAB — CBC
HCT: 43.8 % (ref 36.0–46.0)
Hemoglobin: 14.5 g/dL (ref 12.0–15.0)
MCH: 34.3 pg — ABNORMAL HIGH (ref 26.0–34.0)
MCHC: 33.1 g/dL (ref 30.0–36.0)
MCV: 103.5 fL — ABNORMAL HIGH (ref 80.0–100.0)
Platelets: 276 10*3/uL (ref 150–400)
RBC: 4.23 MIL/uL (ref 3.87–5.11)
RDW: 12.1 % (ref 11.5–15.5)
WBC: 6.3 10*3/uL (ref 4.0–10.5)
nRBC: 0 % (ref 0.0–0.2)

## 2019-11-08 LAB — BASIC METABOLIC PANEL
Anion gap: 8 (ref 5–15)
BUN: 18 mg/dL (ref 6–20)
CO2: 27 mmol/L (ref 22–32)
Calcium: 9.2 mg/dL (ref 8.9–10.3)
Chloride: 103 mmol/L (ref 98–111)
Creatinine, Ser: 0.73 mg/dL (ref 0.44–1.00)
GFR calc Af Amer: >60 mL/min (ref >60)
GFR calc non Af Amer: >60 mL/min (ref >60)
Glucose, Bld: 107 mg/dL — ABNORMAL HIGH (ref 70–99)
Potassium: 4.4 mmol/L (ref 3.5–5.1)
Sodium: 138 mmol/L (ref 135–145)

## 2019-11-08 LAB — HEMOGLOBIN A1C
Hgb A1c MFr Bld: 6.1 % — ABNORMAL HIGH (ref 4.8–5.6)
Mean Plasma Glucose: 128.37 mg/dL

## 2019-11-08 NOTE — Progress Notes (Signed)
PCP - Dr. Dub Amis Cardiologist - none  Chest x-ray - no EKG - 09/18/19 Stress Test - no ECHO - no Cardiac Cath - no  Sleep Study - yes CPAP -yes   Fasting Blood Sugar -120-125  Checks Blood Sugar _____ times a day.every 3 days  Blood Thinner Instructions:NA Aspirin Instructions: Last Dose:  Anesthesia review:   Patient denies shortness of breath, fever, cough and chest pain at PAT appointment  yes Patient verbalized understanding of instructions that were given to them at the PAT appointment. Patient was also instructed that they will need to review over the PAT instructions again at home before surgery. yes

## 2019-11-11 ENCOUNTER — Other Ambulatory Visit (HOSPITAL_COMMUNITY)
Admission: RE | Admit: 2019-11-11 | Discharge: 2019-11-11 | Disposition: A | Discharge status: Home / Self Care | Payer: BC Managed Care – PPO | Source: Ambulatory Visit | Attending: Orthopaedic Surgery | Admitting: Orthopaedic Surgery

## 2019-11-11 DIAGNOSIS — Z01812 Encounter for preprocedural laboratory examination: Secondary | ICD-10-CM | POA: Insufficient documentation

## 2019-11-11 DIAGNOSIS — Z20822 Contact with and (suspected) exposure to covid-19: Secondary | ICD-10-CM | POA: Diagnosis not present

## 2019-11-11 LAB — SARS CORONAVIRUS 2 (TAT 6-24 HRS): SARS Coronavirus 2: NEGATIVE

## 2019-11-14 NOTE — H&P (Signed)
PREOPERATIVE H&P  Chief Complaint: GANGLION CYST LEFT WRIST, CARPAL TUNNEL LEFT WRIST  HPI: [name] is a 54 y.o. female who is scheduled for left  CARPAL TUNNEL RELEASE.   Patient has a past medical history significant for HTN, sleep apnea, IBS, high cholesterol, diabetes, Crohn's disease, and asthma.   This is a 54 year old who has been dealing with carpal tunnel syndrome for awhile.  She has tried bracing, medications and activity modification, but has not made much progress.  She is diabetic, but her hemoglobin A1C is 6.2 and well controlled.  She has had this going on for years.  She has been getting worse over the last few months. She recently underwent carpal tunnel release for her right wrist and did very well after surgery. She wants to proceed with her left wrist. She also has an index finger ganglion cyst on the volar aspect of her finger as well.    Her symptoms are rated as moderate to severe, and have been worsening.  This is significantly impairing activities of daily living.    Please see clinic note for further details on this patient's care.    She has elected for surgical management.   Past Medical History:  Diagnosis Date  . Anxiety    No medications currently  . Arthritis   . Asthma   . Bilateral carpal tunnel syndrome   . Crohn disease ([place])   . Diabetes mellitus    diet controlled  . Heart murmur    mild  . High cholesterol   . Hypertension    pcp  Dr Coralyn Mark Quillian Quince    [place]  . IBS (irritable bowel syndrome)   . Obesity   . Pneumonia 2011   History of  . Sleep apnea 03-2009 study   pt forgot to mention at pre admit visit.  uses c-pap brought her own  machine from home.   . Trigger thumb, right thumb    Past Surgical History:  Procedure Laterality Date  . CARPAL TUNNEL RELEASE Right 09/20/2019   Procedure: CARPAL TUNNEL RELEASE;  Surgeon: Hiram Gash, MD;  Location: [facility];  Service: Orthopedics;  Laterality: Right;  . CHOLECYSTECTOMY N/A  03/31/2013   Procedure: LAPAROSCOPIC CHOLECYSTECTOMY;  Surgeon: Jamesetta So, MD;  Location: [facility] ORS;  Service: General;  Laterality: N/A;  . COLONOSCOPY N/A 03/16/2013   Procedure: COLONOSCOPY;  Surgeon: Rogene Houston, MD;  Location: [facility] END[facility] SU[facility];  Service: Endoscopy;  Laterality: N/A;  255  . COLONOSCOPY N/A 12/31/2014   Procedure: COLONOSCOPY;  Surgeon: Rogene Houston, MD;  Location: [facility];  Service: Endoscopy;  Laterality: N/A;  730  . cyst removed from left hand    . FLEXIBLE SIGMOIDOSCOPY N/A 08/21/2014   Procedure: FLEXIBLE SIGMOIDOSCOPY;  Surgeon: Rogene Houston, MD;  Location: [facility];  Service: Endoscopy;  Laterality: N/A;  1030  . TONSILLECTOMY    . TOTAL HIP ARTHROPLASTY  04/18/2012   Procedure: TOTAL HIP ARTHROPLASTY;  Surgeon: Marybelle Killings, MD;  Location: [facility];  Service: Orthopedics;  Laterality: Right;  Right Total Hip Arthroplasty   Social History   Socioeconomic History  . Marital status: Married    Spouse name: Not on file  . Number of children: Not on file  . Years of education: Not on file  . Highest education level: Not on file  Occupational History  . Not on file  Tobacco Use  . Smoking status: Never Smoker  . Smokeless tobacco:  Never Used  Substance and Sexual Activity  . Alcohol use: No    Alcohol/week: 0.0 standard drinks  . Drug use: No  . Sexual activity: Yes    Birth control/protection: Post-menopausal  Other Topics Concern  . Not on file  Social History Narrative  . Not on file   Social Determinants of Health   Financial Resource Strain:   . Difficulty of Paying Living Expenses:   Food Insecurity:   . Worried About Charity fundraiser in the Last Year:   . Arboriculturist in the Last Year:   Transportation Needs:   . Film/video editor (Medical):   Marland Kitchen Lack of Transportation (Non-Medical):   Physical Activity:   . Days of Exercise per Week:   . Minutes of Exercise per Session:   Stress:   . Feeling of Stress :   Social  Connections:   . Frequency of Communication with Friends and Family:   . Frequency of Social Gatherings with Friends and Family:   . Attends Religious Services:   . Active Member of Clubs or Organizations:   . Attends Archivist Meetings:   Marland Kitchen Marital Status:    No family history on file. Allergies  Allergen Reactions  . Remicade [Infliximab] Swelling    Joints locked up  . Cefprozil Rash  . Penicillins Rash    Did it involve swelling of the face/tongue/throat, SOB, or low BP? No Did it involve sudden or severe rash/hives, skin peeling, or any reaction on the inside of your mouth or nose? No Did you need to seek medical attention at a hospital or doctor's office? No When did it last happen? If all above answers are "NO", may proceed with cephalosporin use.    Prior to Admission medications   Medication Sig Start Date End Date Taking? Authorizing Provider  albuterol (PROVENTIL,VENTOLIN) 90 MCG/ACT inhaler Inhale 2 puffs into the lungs every 6 (six) hours as needed for wheezing or shortness of breath.    Yes [provider]  Calcium Carb-Cholecalciferol (CALCIUM 600 + D PO) Take 1 tablet by mouth daily.   Yes [provider]  cetirizine (ZYRTEC) 10 MG tablet Take 10 mg by mouth every evening.    Yes [provider]  cholecalciferol (VITAMIN D3) 25 MCG (1000 UNIT) tablet Take 1 tablet (1,000 Units total) by mouth daily. 10/05/19  Yes Laurine Blazer A, PA-C  dicyclomine (BENTYL) 20 MG tablet TAKE ONE TABLET BY MOUTH THREE TIMES DAILY AS NEEDED FOR SPASMS Patient taking differently: Take 20 mg by mouth 3 (three) times daily as needed for spasms.  03/30/19  Yes Setzer, Rona Ravens, NP  diltiazem (TIAZAC) 360 MG 24 hr capsule Take 360 mg by mouth every morning.    Yes [provider]  DULoxetine (CYMBALTA) 60 MG capsule Take 60 mg by mouth daily. 10/03/18  Yes [provider]  Fluticasone-Salmeterol (ADVAIR) 250-50 MCG/DOSE AEPB Inhale 1  puff into the lungs every morning.    Yes [provider]  JARDIANCE 25 MG TABS tablet Take 25 mg by mouth daily.  09/14/14  Yes [provider]  lisinopril-hydrochlorothiazide (PRINZIDE,ZESTORETIC) 20-12.5 MG per tablet Take 2 tablets by mouth every morning.    Yes [provider]  loperamide (IMODIUM) 2 MG capsule Take 2 mg by mouth as needed for diarrhea or loose stools.    Yes [provider]  Magnesium 400 MG TABS Take 400 mg by mouth at bedtime.    Yes [provider]  mercaptopurine (PURINETHOL) 50 MG tablet TAKE 1 AND 1/2 TABLETS BY MOUTH DAILY ON EMPTY STOMACH ONE HOUR PRIOR TO MEALS OR TWO HOURS AFTER MEALS. Patient taking differently: Take 75 mg by mouth daily.  05/31/19  Yes [name], Mechele Dawley, MD  Multiple Vitamin (MULITIVITAMIN WITH MINERALS) TABS Take 1 tablet by mouth every morning.    Yes [provider]  PENTASA 500 MG CR capsule TAKE TWO (2) CAPSULES BY MOUTH FOUR TIMES DAILY Patient taking differently: Take 1,000 mg by mouth 4 (four) times daily.  12/05/18  Yes Setzer, Terri L, NP  rosuvastatin (CRESTOR) 5 MG tablet Take 5 mg by mouth once a week.  05/05/16  Yes [provider]  spironolactone (ALDACTONE) 25 MG tablet Take 25 mg by mouth every morning.    Yes [provider]  traZODone (DESYREL) 50 MG tablet Take 100 mg by mouth at bedtime. 06/26/19   [provider]    ROS: All other systems have been reviewed and were otherwise negative with the exception of those mentioned in the HPI and as above.  Physical Exam: General: Alert, no acute distress Cardiovascular: No pedal edema Respiratory: No cyanosis, no use of accessory musculature GI: No organomegaly, abdomen is soft and non-tender Skin: No lesions in the area of chief complaint Neurologic: Sensation intact distally Psychiatric: Patient is competent for consent with normal mood and affect Lymphatic: No axillary or cervical  lymphadenopathy  MUSCULOSKELETAL:  Left wrist: positive Tinel's at the carpal tunnel.  She has fullness in the index finger MP joint consistent with a ganglion cyst most likely.    Assessment: GANGLION CYST LEFT WRIST, CARPAL TUNNEL LEFT WRIST  Plan: Plan for Procedure(s): CARPAL TUNNEL RELEASE  The risks benefits and alternatives were discussed with the patient including but not limited to the risks of nonoperative treatment, versus surgical intervention including infection, bleeding, nerve injury,  blood clots, cardiopulmonary complications, morbidity, mortality, among others, and they were willing to proceed.   The patient acknowledged the explanation, agreed to proceed with the plan and consent was signed.   Operative Plan: left carpal tunnel release with ganglion cyst excision Discharge Medications: Tylenol, Celebrex, Oxycodone, Zofran     DVT Prophylaxis: None   Ethelda Chick, PA-C  11/14/2019 7:13 AM

## 2019-11-14 NOTE — Progress Notes (Signed)
Pt called about surgery time change 

## 2019-11-15 ENCOUNTER — Ambulatory Visit (HOSPITAL_COMMUNITY)
Admission: RE | Admit: 2019-11-15 | Discharge: 2019-11-15 | Disposition: A | Discharge status: Home / Self Care | Payer: BC Managed Care – PPO | Source: Ambulatory Visit | Attending: Orthopaedic Surgery | Admitting: Orthopaedic Surgery

## 2019-11-15 ENCOUNTER — Encounter (HOSPITAL_COMMUNITY): Payer: Self-pay | Admitting: Orthopaedic Surgery

## 2019-11-15 ENCOUNTER — Ambulatory Visit (HOSPITAL_COMMUNITY): Payer: BC Managed Care – PPO | Admitting: Certified Registered Nurse Anesthetist

## 2019-11-15 ENCOUNTER — Encounter (HOSPITAL_COMMUNITY)
Admission: RE | Disposition: A | Discharge status: Home / Self Care | Payer: Self-pay | Source: Ambulatory Visit | Attending: Orthopaedic Surgery

## 2019-11-15 DIAGNOSIS — J45909 Unspecified asthma, uncomplicated: Secondary | ICD-10-CM | POA: Insufficient documentation

## 2019-11-15 DIAGNOSIS — E7800 Pure hypercholesterolemia, unspecified: Secondary | ICD-10-CM | POA: Diagnosis not present

## 2019-11-15 DIAGNOSIS — E119 Type 2 diabetes mellitus without complications: Secondary | ICD-10-CM | POA: Insufficient documentation

## 2019-11-15 DIAGNOSIS — M67432 Ganglion, left wrist: Secondary | ICD-10-CM | POA: Diagnosis not present

## 2019-11-15 DIAGNOSIS — Z6841 Body Mass Index (BMI) 40.0 and over, adult: Secondary | ICD-10-CM | POA: Diagnosis not present

## 2019-11-15 DIAGNOSIS — M65322 Trigger finger, left index finger: Secondary | ICD-10-CM | POA: Diagnosis not present

## 2019-11-15 DIAGNOSIS — G5603 Carpal tunnel syndrome, bilateral upper limbs: Secondary | ICD-10-CM | POA: Diagnosis not present

## 2019-11-15 DIAGNOSIS — Z7984 Long term (current) use of oral hypoglycemic drugs: Secondary | ICD-10-CM | POA: Diagnosis not present

## 2019-11-15 DIAGNOSIS — F419 Anxiety disorder, unspecified: Secondary | ICD-10-CM | POA: Insufficient documentation

## 2019-11-15 DIAGNOSIS — G4730 Sleep apnea, unspecified: Secondary | ICD-10-CM | POA: Insufficient documentation

## 2019-11-15 DIAGNOSIS — Z7951 Long term (current) use of inhaled steroids: Secondary | ICD-10-CM | POA: Diagnosis not present

## 2019-11-15 DIAGNOSIS — Z96641 Presence of right artificial hip joint: Secondary | ICD-10-CM | POA: Diagnosis not present

## 2019-11-15 DIAGNOSIS — K5090 Crohn's disease, unspecified, without complications: Secondary | ICD-10-CM | POA: Insufficient documentation

## 2019-11-15 DIAGNOSIS — I10 Essential (primary) hypertension: Secondary | ICD-10-CM | POA: Insufficient documentation

## 2019-11-15 DIAGNOSIS — Z79899 Other long term (current) drug therapy: Secondary | ICD-10-CM | POA: Diagnosis not present

## 2019-11-15 HISTORY — PX: CARPAL TUNNEL RELEASE: SHX101

## 2019-11-15 LAB — GLUCOSE, CAPILLARY
Glucose-Capillary: 109 mg/dL — ABNORMAL HIGH (ref 70–99)
Glucose-Capillary: 112 mg/dL — ABNORMAL HIGH (ref 70–99)

## 2019-11-15 SURGERY — CARPAL TUNNEL RELEASE
Anesthesia: General | Site: Arm Lower | Laterality: Left

## 2019-11-15 MED ORDER — FENTANYL CITRATE (PF) 100 MCG/2ML IJ SOLN
INTRAMUSCULAR | Status: DC | PRN
  Administered 2019-11-15 (×3): 50 ug via INTRAVENOUS

## 2019-11-15 MED ORDER — ONDANSETRON HCL 4 MG/2ML IJ SOLN
INTRAMUSCULAR | Status: DC | PRN
  Administered 2019-11-15: 4 mg via INTRAVENOUS

## 2019-11-15 MED ORDER — PROPOFOL 10 MG/ML IV BOLUS
INTRAVENOUS | Status: DC | PRN
  Administered 2019-11-15: 200 mg via INTRAVENOUS

## 2019-11-15 MED ORDER — LACTATED RINGERS IV SOLN: INTRAVENOUS | Status: DC

## 2019-11-15 MED ORDER — FENTANYL CITRATE (PF) 100 MCG/2ML IJ SOLN
25.0000 ug | INTRAMUSCULAR | Status: DC | PRN
  Administered 2019-11-15 (×3): 50 ug via INTRAVENOUS

## 2019-11-15 MED ORDER — ACETAMINOPHEN 500 MG PO TABS: 1000.0000 mg | ORAL_TABLET | Freq: Three times a day (TID) | ORAL | 0 refills | Status: AC

## 2019-11-15 MED ORDER — VANCOMYCIN HCL 1000 MG IV SOLR
INTRAVENOUS | Status: DC | PRN
  Administered 2019-11-15: 1000 mg via TOPICAL

## 2019-11-15 MED ORDER — FENTANYL CITRATE (PF) 100 MCG/2ML IJ SOLN
INTRAMUSCULAR | Status: AC
  Filled 2019-11-15: qty 2

## 2019-11-15 MED ORDER — PROPOFOL 10 MG/ML IV BOLUS
INTRAVENOUS | Status: AC
  Filled 2019-11-15: qty 40

## 2019-11-15 MED ORDER — SODIUM CHLORIDE 0.9 % IR SOLN
Status: DC | PRN
  Administered 2019-11-15: 1000 mL

## 2019-11-15 MED ORDER — OXYCODONE HCL 5 MG PO TABS: 5.0000 mg | ORAL_TABLET | Freq: Once | ORAL | Status: DC | PRN

## 2019-11-15 MED ORDER — SUGAMMADEX SODIUM 500 MG/5ML IV SOLN
INTRAVENOUS | Status: AC
  Filled 2019-11-15: qty 5

## 2019-11-15 MED ORDER — BUPIVACAINE HCL (PF) 0.5 % IJ SOLN
INTRAMUSCULAR | Status: AC
  Filled 2019-11-15: qty 30

## 2019-11-15 MED ORDER — DEXAMETHASONE SODIUM PHOSPHATE 10 MG/ML IJ SOLN
INTRAMUSCULAR | Status: AC
  Filled 2019-11-15: qty 1

## 2019-11-15 MED ORDER — DEXTROSE 5 % IV SOLN
3.0000 g | INTRAVENOUS | Status: AC
  Administered 2019-11-15: 3 g via INTRAVENOUS
  Filled 2019-11-15: qty 3

## 2019-11-15 MED ORDER — LIDOCAINE 2% (20 MG/ML) 5 ML SYRINGE
INTRAMUSCULAR | Status: AC
  Filled 2019-11-15: qty 5

## 2019-11-15 MED ORDER — ONDANSETRON HCL 4 MG/2ML IJ SOLN
INTRAMUSCULAR | Status: AC
  Filled 2019-11-15: qty 2

## 2019-11-15 MED ORDER — BUPIVACAINE HCL 0.25 % IJ SOLN
INTRAMUSCULAR | Status: AC
  Filled 2019-11-15: qty 1

## 2019-11-15 MED ORDER — CELECOXIB 100 MG PO CAPS: 100.0000 mg | ORAL_CAPSULE | Freq: Two times a day (BID) | ORAL | 0 refills | Status: AC

## 2019-11-15 MED ORDER — ONDANSETRON HCL 4 MG PO TABS: 4.0000 mg | ORAL_TABLET | Freq: Three times a day (TID) | ORAL | 1 refills | Status: AC | PRN

## 2019-11-15 MED ORDER — MIDAZOLAM HCL 2 MG/2ML IJ SOLN
INTRAMUSCULAR | Status: DC | PRN
  Administered 2019-11-15: 2 mg via INTRAVENOUS

## 2019-11-15 MED ORDER — PROPOFOL 10 MG/ML IV BOLUS
INTRAVENOUS | Status: AC
  Filled 2019-11-15: qty 20

## 2019-11-15 MED ORDER — PROMETHAZINE HCL 25 MG/ML IJ SOLN: 6.2500 mg | INTRAMUSCULAR | Status: DC | PRN

## 2019-11-15 MED ORDER — VANCOMYCIN HCL 1000 MG IV SOLR
INTRAVENOUS | Status: AC
  Filled 2019-11-15: qty 1000

## 2019-11-15 MED ORDER — MIDAZOLAM HCL 2 MG/2ML IJ SOLN
INTRAMUSCULAR | Status: AC
  Filled 2019-11-15: qty 2

## 2019-11-15 MED ORDER — BUPIVACAINE HCL (PF) 0.25 % IJ SOLN
INTRAMUSCULAR | Status: DC | PRN
  Administered 2019-11-15: 10 mL

## 2019-11-15 MED ORDER — DEXAMETHASONE SODIUM PHOSPHATE 10 MG/ML IJ SOLN
INTRAMUSCULAR | Status: DC | PRN
  Administered 2019-11-15: 10 mg via INTRAVENOUS

## 2019-11-15 MED ORDER — LIDOCAINE 2% (20 MG/ML) 5 ML SYRINGE
INTRAMUSCULAR | Status: DC | PRN
  Administered 2019-11-15: 100 mg via INTRAVENOUS

## 2019-11-15 MED ORDER — OXYCODONE HCL 5 MG PO TABS: ORAL_TABLET | ORAL | 0 refills | Status: AC

## 2019-11-15 MED ORDER — OXYCODONE HCL 5 MG/5ML PO SOLN: 5.0000 mg | Freq: Once | ORAL | Status: DC | PRN

## 2019-11-15 MED ORDER — MEPERIDINE HCL 50 MG/ML IJ SOLN: 6.2500 mg | INTRAMUSCULAR | Status: DC | PRN

## 2019-11-15 MED ORDER — CHLORHEXIDINE GLUCONATE 4 % EX LIQD: 60.0000 mL | Freq: Once | CUTANEOUS | Status: DC

## 2019-11-15 SURGICAL SUPPLY — 55 items
APL SKNCLS STERI-STRIP NONHPOA (GAUZE/BANDAGES/DRESSINGS) ×1
BENZOIN TINCTURE PRP APPL 2/3 (GAUZE/BANDAGES/DRESSINGS) ×2 IMPLANT
BLADE SURG 15 STRL LF DISP TIS (BLADE) ×2 IMPLANT
BLADE SURG 15 STRL SS (BLADE) ×4
BNDG CMPR 9X4 STRL LF SNTH (GAUZE/BANDAGES/DRESSINGS) ×1
BNDG COHESIVE 4X5 TAN STRL (GAUZE/BANDAGES/DRESSINGS) ×2 IMPLANT
BNDG ELASTIC 4X5.8 VLCR STR LF (GAUZE/BANDAGES/DRESSINGS) ×2 IMPLANT
BNDG ESMARK 4X9 LF (GAUZE/BANDAGES/DRESSINGS) ×2 IMPLANT
CLSR STERI-STRIP ANTIMIC 1/2X4 (GAUZE/BANDAGES/DRESSINGS) IMPLANT
CORD BIPOLAR FORCEPS 12FT (ELECTRODE) ×2 IMPLANT
COVER BACK TABLE 60X90IN (DRAPES) IMPLANT
COVER MAYO STAND STRL (DRAPES) ×2 IMPLANT
COVER WAND RF STERILE (DRAPES) IMPLANT
CUFF TOURN SGL QUICK 18X4 (TOURNIQUET CUFF) IMPLANT
DECANTER SPIKE VIAL GLASS SM (MISCELLANEOUS) IMPLANT
DRAPE EXTREMITY T 121X128X90 (DISPOSABLE) ×2 IMPLANT
DRAPE IMP U-DRAPE 54X76 (DRAPES) ×4 IMPLANT
DRAPE SURG 17X23 STRL (DRAPES) ×2 IMPLANT
DRSG PAD ABDOMINAL 8X10 ST (GAUZE/BANDAGES/DRESSINGS) ×1 IMPLANT
ELECT REM PT RETURN 15FT ADLT (MISCELLANEOUS) ×2 IMPLANT
GAUZE SPONGE 4X4 12PLY STRL (GAUZE/BANDAGES/DRESSINGS) ×2 IMPLANT
GAUZE XEROFORM 1X8 LF (GAUZE/BANDAGES/DRESSINGS) ×2 IMPLANT
GLOVE BIO SURGEON STRL SZ 6.5 (GLOVE) ×2 IMPLANT
GLOVE BIOGEL PI IND STRL 6.5 (GLOVE) ×1 IMPLANT
GLOVE BIOGEL PI IND STRL 8 (GLOVE) ×1 IMPLANT
GLOVE BIOGEL PI INDICATOR 6.5 (GLOVE) ×1
GLOVE BIOGEL PI INDICATOR 8 (GLOVE) ×1
GLOVE ECLIPSE 8.0 STRL XLNG CF (GLOVE) ×2 IMPLANT
GOWN STRL REUS W/ TWL LRG LVL3 (GOWN DISPOSABLE) ×1 IMPLANT
GOWN STRL REUS W/TWL LRG LVL3 (GOWN DISPOSABLE) ×2
GOWN STRL REUS W/TWL XL LVL3 (GOWN DISPOSABLE) ×2 IMPLANT
KIT BASIN OR (CUSTOM PROCEDURE TRAY) ×2 IMPLANT
NS IRRIG 1000ML POUR BTL (IV SOLUTION) ×2 IMPLANT
PACK ORTHO EXTREMITY (CUSTOM PROCEDURE TRAY) ×2 IMPLANT
PAD CAST 4YDX4 CTTN HI CHSV (CAST SUPPLIES) ×1 IMPLANT
PADDING CAST COTTON 4X4 STRL (CAST SUPPLIES) ×2
PENCIL SMOKE EVACUATOR (MISCELLANEOUS) ×2 IMPLANT
SLEEVE SCD COMPRESS KNEE MED (MISCELLANEOUS) IMPLANT
SLING ARM FOAM STRAP LRG (SOFTGOODS) ×1 IMPLANT
SPLINT PLASTER CAST XFAST 4X15 (CAST SUPPLIES) IMPLANT
SPLINT PLASTER EXTRA FAST 3X15 (CAST SUPPLIES)
SPLINT PLASTER GYPS XFAST 3X15 (CAST SUPPLIES) IMPLANT
SPLINT PLASTER XTRA FAST SET 4 (CAST SUPPLIES) ×1
STOCKINETTE 4X48 STRL (DRAPES) ×2 IMPLANT
SUCTION FRAZIER HANDLE 10FR (MISCELLANEOUS) ×2
SUCTION TUBE FRAZIER 10FR DISP (MISCELLANEOUS) ×1 IMPLANT
SUT MNCRL AB 3-0 PS2 18 (SUTURE) IMPLANT
SUT MNCRL AB 4-0 PS2 18 (SUTURE) ×1 IMPLANT
SUT PROLENE 3 0 PS 2 (SUTURE) ×2 IMPLANT
SUT VIC AB 3-0 SH 27 (SUTURE) ×2
SUT VIC AB 3-0 SH 27X BRD (SUTURE) ×1 IMPLANT
SYR BULB 3OZ (MISCELLANEOUS) ×2 IMPLANT
TOWEL OR 17X26 10 PK STRL BLUE (TOWEL DISPOSABLE) ×2 IMPLANT
TUBE CONNECTING 20X1/4 (TUBING) ×2 IMPLANT
YANKAUER SUCT BULB TIP NO VENT (SUCTIONS) IMPLANT

## 2019-11-15 NOTE — Interval H&P Note (Signed)
History and Physical Interval Note:  11/15/2019 11:33 AM  [name]  has presented today for surgery, with the diagnosis of GANGLION CYST LEFT WRIST, CARPAL TUNNEL LEFT WRIST.  The various methods of treatment have been discussed with the patient and family. After consideration of risks, benefits and other options for treatment, the patient has consented to  Procedure(s): CARPAL TUNNEL RELEASE (Left) as a surgical intervention.  The patient's history has been reviewed, patient examined, no change in status, stable for surgery.  I have reviewed the patient's chart and labs.  Questions were answered to the patient's satisfaction.     Hiram Gash

## 2019-11-15 NOTE — Anesthesia Procedure Notes (Signed)
Procedure Name: LMA Insertion Date/Time: 11/15/2019 2:04 PM Performed by: Lissa Morales, CRNA Pre-anesthesia Checklist: Patient identified, Emergency Drugs available, Suction available and Patient being monitored Patient Re-evaluated:Patient Re-evaluated prior to induction Oxygen Delivery Method: Circle system utilized Preoxygenation: Pre-oxygenation with 100% oxygen Induction Type: IV induction Ventilation: Mask ventilation with difficulty LMA: LMA inserted LMA Size: 4.0 Tube type: Oral Number of attempts: 1 Airway Equipment and Method: Oral airway Placement Confirmation: positive ETCO2 Tube secured with: Tape Dental Injury: Teeth and Oropharynx as per pre-operative assessment

## 2019-11-15 NOTE — Op Note (Signed)
Orthopaedic Surgery Operative Note (CSN: [id number])  [name]  25-Sep-1965 Date of Surgery: 11/15/2019   Diagnoses:  Left carpal tunnel syndrome and index finger metacarpophalangeal mass  Procedure: Left open carpal tunnel release Left first metacarpophalangeal mass excision Left index finger MP joint trigger finger release of A1 pulley   Operative Finding Successful completion of the planned procedure.  Carpal tunnel release went without issue with full release and no residual tissue.  Mass excision was a 5 mm x 5 mm small multiloculated mass consistent with a ganglion cyst coming from the A1 pulley.  We did release the A1 pulley to prevent further possible cyst formation.  Post-operative plan: The patient will be nonweightbearing in a splint for a week and then a removable splint afterwards for 2 weeks.  The patient will be discharged home.  DVT prophylaxis not indicated in this ambulatory upper extremity patient without significant risk factors.   Pain control with PRN pain medication preferring oral medicines.  Follow up plan will be scheduled in approximately 7 days for incision check.  Post-Op Diagnosis: Same Surgeons:Primary: Hiram Gash, MD Assistants:Caroline McBane PA-C Location: [place] [facility] Anesthesia: General with local anesthesia Antibiotics: Ancef 3 g with 1 g of local vancomycin powder Tourniquet time: 15 minutes Estimated Blood Loss: Minimal minimal Complications: None Specimens: 1 mass from index finger MP joint for permanent Implants: * No implants in log *  Indications for Surgery:   [name] is a 54 y.o. female with bilateral carpal tunnel syndrome and a index finger mass with positive EMGs.  Benefits and risks of operative and nonoperative management were discussed prior to surgery with patient/guardian(s) and informed consent form was completed.  Specific risks including infection, need for additional surgery, continued pain, numbness,  neurovascular damage, return of cyst   Procedure:   The patient was identified properly. Informed consent was obtained and the surgical site was marked. The patient was taken up to suite where general anesthesia was induced.  The patient was positioned supine with a hand table with a forearm tourniquet.  The left wrist was prepped and draped in the usual sterile fashion.  Timeout was performed before the beginning of the case.  Tourniquet was used for the above duration.  Incision was made in line with the radial border of the ring finger. The carpal tunnel transverse fascia was identified, cleaned, and incised sharply. The common sensory branches were visualized along with the superficial palmar arch and protected.  The median nerve was protected below. Deep retractors were placed underneath the transverse carpal ligament, protecting the nerve. I released the ligament completely, and then released the proximal distal volar forearm fascia. The nerve was identified, and visualized, and protected throughout the case.   We turned our attention to the index finger.  A oblique incision was made in line with the dominant palmar crease over the mass that was centered at the MP joint.  We went through skin sharply taking care not to dive deep.  We used blunt scissors to dissect down to the mass itself.  A multiloculated mass that appeared consistent with a ganglion cyst was noted.  It was 5 x 5 mm in size.  It appeared to be originating from the A1 pulley.  We performed a careful release of the A1 pulley and noted that was fully released.  We then were able to remove the mass en bloc and appeared consistent with a ganglion cyst.  We noted that the stump was gone  and that we used a electrocautery to seal the stump.  The bundles were protected throughout this portion of the case.    The wounds were irrigated copiously, and the wounds injected, and the skin closed with nonabsorbable sutures followed by a volar  splint and sterile gauze. Patient  tolerated this well, with no complications.  Noemi Chapel, PA-C, present and scrubbed throughout the case, critical for completion in a timely fashion, and for retraction, instrumentation, closure.

## 2019-11-15 NOTE — Progress Notes (Signed)
Called patient's husband , Milta Deiters, and informed him that surgery is delayed. He verbalizes understanding.

## 2019-11-15 NOTE — Transfer of Care (Signed)
Immediate Anesthesia Transfer of Care Note  Patient: [name]  Procedure(s) Performed: CARPAL TUNNEL RELEASE, EXCISION OF GANGLION CYST (Left Arm Lower)  Patient Location: PACU  Anesthesia Type:General  Level of Consciousness: awake, alert , oriented and patient cooperative  Airway & Oxygen Therapy: Patient Spontanous Breathing and Patient connected to face mask oxygen  Post-op Assessment: Report given to RN, Post -op Vital signs reviewed and stable and Patient moving all extremities X 4  Post vital signs: stable  Last Vitals:  Vitals Value Taken Time  BP 133/63 11/15/19 1453  Temp    Pulse 93 11/15/19 1455  Resp 15 11/15/19 1455  SpO2 95 % 11/15/19 1455  Vitals shown include unvalidated device data.  Last Pain:  Vitals:   11/15/19 1059  TempSrc: Oral         Complications: No apparent anesthesia complications

## 2019-11-15 NOTE — Anesthesia Preprocedure Evaluation (Signed)
Anesthesia Evaluation  Patient identified by MRN, date of birth, ID band Patient awake    Reviewed: Allergy & Precautions, NPO status , Patient's Chart, lab work & pertinent test results  Airway Mallampati: III  TM Distance: <3 FB Neck ROM: Full    Dental  (+) Dental Advisory Given   Pulmonary asthma , sleep apnea and Continuous Positive Airway Pressure Ventilation , pneumonia,    Pulmonary exam normal breath sounds clear to auscultation       Cardiovascular hypertension, negative cardio ROS Normal cardiovascular exam Rhythm:Regular Rate:Normal     Neuro/Psych negative neurological ROS  negative psych ROS   GI/Hepatic negative GI ROS, Neg liver ROS,   Endo/Other  diabetesMorbid obesity  Renal/GU negative Renal ROS     Musculoskeletal  (+) Arthritis ,   Abdominal (+) + obese,   Peds  Hematology negative hematology ROS (+)   Anesthesia Other Findings   Reproductive/Obstetrics negative OB ROS                             Anesthesia Physical  Anesthesia Plan  ASA: III  Anesthesia Plan: General   Post-op Pain Management:    Induction: Intravenous  PONV Risk Score and Plan: 3 and Ondansetron, Dexamethasone and Treatment may vary due to age or medical condition  Airway Management Planned: LMA  Additional Equipment: None  Intra-op Plan:   Post-operative Plan: Extubation in OR  Informed Consent: I have reviewed the patients History and Physical, chart, labs and discussed the procedure including the risks, benefits and alternatives for the proposed anesthesia with the patient or authorized representative who has indicated his/her understanding and acceptance.     Dental advisory given  Plan Discussed with: CRNA  Anesthesia Plan Comments:         Anesthesia Quick Evaluation

## 2019-11-16 ENCOUNTER — Encounter: Payer: Self-pay | Admitting: *Deleted

## 2019-11-16 LAB — SURGICAL PATHOLOGY

## 2019-11-16 NOTE — Anesthesia Postprocedure Evaluation (Signed)
Anesthesia Post Note  Patient: [name]  Procedure(s) Performed: CARPAL TUNNEL RELEASE, EXCISION OF GANGLION CYST (Left Arm Lower)     Patient location during evaluation: PACU Anesthesia Type: General Level of consciousness: sedated and patient cooperative Pain management: pain level controlled Vital Signs Assessment: post-procedure vital signs reviewed and stable Respiratory status: spontaneous breathing Cardiovascular status: stable Anesthetic complications: no    Last Vitals:  Vitals:   11/15/19 1615 11/15/19 1630  BP: 132/83 130/76  Pulse: 85 80  Resp: 16   Temp: 36.7 C   SpO2: 93% 96%    Last Pain:  Vitals:   11/15/19 1630  TempSrc:   PainSc: [place]

## 2019-11-22 ENCOUNTER — Other Ambulatory Visit (INDEPENDENT_AMBULATORY_CARE_PROVIDER_SITE_OTHER): Payer: Self-pay | Admitting: *Deleted

## 2019-11-22 DIAGNOSIS — K5010 Crohn's disease of large intestine without complications: Secondary | ICD-10-CM

## 2019-11-22 MED ORDER — MESALAMINE ER 500 MG PO CPCR: ORAL_CAPSULE | ORAL | 11 refills | Status: DC

## 2019-12-15 ENCOUNTER — Encounter (HOSPITAL_COMMUNITY): Payer: Self-pay | Admitting: Pediatrics

## 2019-12-15 ENCOUNTER — Emergency Department (HOSPITAL_COMMUNITY): Payer: BC Managed Care – PPO

## 2019-12-15 ENCOUNTER — Emergency Department (HOSPITAL_COMMUNITY)
Admission: EM | Admit: 2019-12-15 | Discharge: 2019-12-15 | Disposition: A | Discharge status: Home / Self Care | Payer: BC Managed Care – PPO | Attending: Emergency Medicine | Admitting: Emergency Medicine

## 2019-12-15 ENCOUNTER — Other Ambulatory Visit: Payer: Self-pay

## 2019-12-15 DIAGNOSIS — Z7984 Long term (current) use of oral hypoglycemic drugs: Secondary | ICD-10-CM | POA: Diagnosis not present

## 2019-12-15 DIAGNOSIS — Z96641 Presence of right artificial hip joint: Secondary | ICD-10-CM | POA: Insufficient documentation

## 2019-12-15 DIAGNOSIS — J45909 Unspecified asthma, uncomplicated: Secondary | ICD-10-CM | POA: Diagnosis not present

## 2019-12-15 DIAGNOSIS — I10 Essential (primary) hypertension: Secondary | ICD-10-CM | POA: Insufficient documentation

## 2019-12-15 DIAGNOSIS — M545 Low back pain, unspecified: Secondary | ICD-10-CM

## 2019-12-15 DIAGNOSIS — Z9049 Acquired absence of other specified parts of digestive tract: Secondary | ICD-10-CM | POA: Diagnosis not present

## 2019-12-15 DIAGNOSIS — E119 Type 2 diabetes mellitus without complications: Secondary | ICD-10-CM | POA: Insufficient documentation

## 2019-12-15 DIAGNOSIS — M25551 Pain in right hip: Secondary | ICD-10-CM | POA: Insufficient documentation

## 2019-12-15 DIAGNOSIS — Z79899 Other long term (current) drug therapy: Secondary | ICD-10-CM | POA: Diagnosis not present

## 2019-12-15 MED ORDER — KETOROLAC TROMETHAMINE 60 MG/2ML IM SOLN
60.0000 mg | Freq: Once | INTRAMUSCULAR | Status: AC
  Administered 2019-12-15: 60 mg via INTRAMUSCULAR
  Filled 2019-12-15: qty 2

## 2019-12-15 MED ORDER — CYCLOBENZAPRINE HCL 10 MG PO TABS: 10.0000 mg | ORAL_TABLET | Freq: Two times a day (BID) | ORAL | 0 refills | Status: AC | PRN

## 2019-12-15 MED ORDER — HYDROMORPHONE HCL 1 MG/ML IJ SOLN
1.0000 mg | Freq: Once | INTRAMUSCULAR | Status: AC
  Administered 2019-12-15: 19:00:00 1 mg via INTRAMUSCULAR
  Filled 2019-12-15: qty 1

## 2019-12-15 MED ORDER — HYDROCODONE-ACETAMINOPHEN 5-325 MG PO TABS: 1.0000 | ORAL_TABLET | Freq: Four times a day (QID) | ORAL | 0 refills | Status: AC | PRN

## 2019-12-15 MED ORDER — CYCLOBENZAPRINE HCL 10 MG PO TABS
5.0000 mg | ORAL_TABLET | Freq: Once | ORAL | Status: AC
  Administered 2019-12-15: 5 mg via ORAL
  Filled 2019-12-15: qty 1

## 2019-12-15 MED ORDER — MORPHINE SULFATE (PF) 4 MG/ML IV SOLN
4.0000 mg | Freq: Once | INTRAVENOUS | Status: AC
  Administered 2019-12-15: 4 mg via INTRAMUSCULAR
  Filled 2019-12-15: qty 1

## 2019-12-15 NOTE — ED Triage Notes (Signed)
C/o lower backpain radiating towards the front thigh hip area down to the legs; patient reported pain is worst when she is move her legs open to pee

## 2019-12-15 NOTE — Discharge Instructions (Signed)
You are seen today for back pain and right hip and leg pain.  Your x-rays are reassuring.  I think this pain may be coming from your lower back.  Possibly you have a pinched nerve in your back which the neurosurgeon can address.  You should also follow-up with orthopedics.  We will try and make you more comfortable with the prescription medications that we sent to pharmacy.  Please follow-up with a specialist.  If you have any new or worsening symptoms, specifically if you have any fever, loss of control of going to the bathroom, new leg weakness or numbness and please return to the emergency department.  Please be aware that the medications that we prescribed can make you sleepy or drowsy.  Do not drive with these medications or operate heavy machinery.  Furthermore, these medications can be addictive and should only be taken for severe pain.

## 2019-12-15 NOTE — ED Provider Notes (Signed)
[facility] EMERGENCY DEPARTMENT Provider Note   CSN: [id number] Arrival date & time: 12/15/19  1550     History Chief Complaint  Patient presents with  . Hip Pain  . Back Pain    [name] is a 54 y.o. female.  Patient is a 54 year old female with past medical history of obesity, arthritis, asthma, status post right hip replacement several years ago presenting to the emergency department for right hip pain and lower back pain. Patient has had issues with the same areas of pain in the past and has previously seen orthopedic as well as neurosurgery with work-up including MRI of the lumbar spine. Also had intertrochanteric bursa injection on the right-hand side last month by orthopedics. Reports that the pain starts in the lower part of her back on the right side and extends into the right groin and anterior leg stopping before the knee. Denies any numbness, tingling, fever, saddle anesthesia, loss of control of bowel or bladder movements. She is not currently taking any medication for this. Reports that the pain acutely got worse over the last couple of days without any injury or trauma. Reports that she has worsening pain with laying on the right side or with movements.        Past Medical History:  Diagnosis Date  . Anxiety    No medications currently  . Arthritis   . Asthma   . Bilateral carpal tunnel syndrome   . Crohn disease ([place])   . Diabetes mellitus    diet controlled  . Heart murmur    mild  . High cholesterol   . Hypertension    pcp  Dr Coralyn Mark Qu[place]    [place]  . IBS (irritable bowel syndrome)   . Obesity   . Pneumonia 2011   History of  . Sleep apnea 03-2009 study   pt forgot to mention at pre admit visit.  uses c-pap brought her own  machine from home.   . Trigger thumb, right thumb     Patient Active Problem List   Diagnosis Date Noted  . History of total hip arthroplasty, right 10/12/2019  . Trochanteric bursitis, right hip  10/12/2019  . Trigger thumb, right thumb 04/21/2018  . IBS (irritable bowel syndrome) 06/18/2015  . Obesity 10/08/2014  . Osteoarthritis of right hip 04/20/2012  . Hypertension 10/08/2011  . High cholesterol 10/08/2011  . Diabetes in pregnancy 10/08/2011  . Crohn disease ([place]) 10/08/2011    Past Surgical History:  Procedure Laterality Date  . CARPAL TUNNEL RELEASE Right 09/20/2019   Procedure: CARPAL TUNNEL RELEASE;  Surgeon: Hiram Gash, MD;  Location: [facility];  Service: Orthopedics;  Laterality: Right;  . CARPAL TUNNEL RELEASE Left 11/15/2019   Procedure: CARPAL TUNNEL RELEASE, EXCISION OF GANGLION CYST;  Surgeon: Hiram Gash, MD;  Location: [facility];  Service: Orthopedics;  Laterality: Left;  . CHOLECYSTECTOMY N/A 03/31/2013   Procedure: LAPAROSCOPIC CHOLECYSTECTOMY;  Surgeon: Jamesetta So, MD;  Location: [facility];  Service: General;  Laterality: N/A;  . COLONOSCOPY N/A 03/16/2013   Procedure: COLONOSCOPY;  Surgeon: Rogene Houston, MD;  Location: [facility];  Service: Endoscopy;  Laterality: N/A;  255  . COLONOSCOPY N/A 12/31/2014   Procedure: COLONOSCOPY;  Surgeon: Rogene Houston, MD;  Location: [facility];  Service: Endoscopy;  Laterality: N/A;  730  . cyst removed from left hand    . FLEXIBLE SIGMOIDOSCOPY N/A 08/21/2014   Procedure: FLEXIBLE SIGMOIDOSCOPY;  Surgeon: Rogene Houston,  MD;  Location: [facility];  Service: Endoscopy;  Laterality: N/A;  1030  . TONSILLECTOMY    . TOTAL HIP ARTHROPLASTY  04/18/2012   Procedure: TOTAL HIP ARTHROPLASTY;  Surgeon: Marybelle Killings, MD;  Location: [facility];  Service: Orthopedics;  Laterality: Right;  Right Total Hip Arthroplasty     OB History   No obstetric history on file.     No family history on file.  Social History   Tobacco Use  . Smoking status: Never Smoker  . Smokeless tobacco: Never Used  Substance Use Topics  . Alcohol use: No    Alcohol/week: 0.0 standard drinks  . Drug use: No    Home Medications Prior to  Admission medications   Medication Sig Start Date End Date Taking? Authorizing Provider  albuterol (PROVENTIL,VENTOLIN) 90 MCG/ACT inhaler Inhale 2 puffs into the lungs every 6 (six) hours as needed for wheezing or shortness of breath.     [provider]  Calcium Carb-Cholecalciferol (CALCIUM 600 + D PO) Take 1 tablet by mouth daily.    [provider]  celecoxib (CELEBREX) 100 MG capsule Take 1 capsule (100 mg total) by mouth 2 (two) times daily. 11/15/19 12/15/19  Ethelda Chick, PA-C  cetirizine (ZYRTEC) 10 MG tablet Take 10 mg by mouth every evening.     [provider]  cholecalciferol (VITAMIN D3) 25 MCG (1000 UNIT) tablet Take 1 tablet (1,000 Units total) by mouth daily. 10/05/19   Minus Liberty, PA-C  cyclobenzaprine (FLEXERIL) 10 MG tablet Take 1 tablet (10 mg total) by mouth 2 (two) times daily as needed for up to 7 days for muscle spasms. 12/15/19 12/22/19  Alveria Apley, PA-C  dicyclomine (BENTYL) 20 MG tablet TAKE ONE TABLET BY MOUTH THREE TIMES DAILY AS NEEDED FOR SPASMS Patient taking differently: Take 20 mg by mouth 3 (three) times daily as needed for spasms.  03/30/19   Setzer, Rona Ravens, NP  diltiazem (TIAZAC) 360 MG 24 hr capsule Take 360 mg by mouth every morning.     [provider]  DULoxetine (CYMBALTA) 60 MG capsule Take 60 mg by mouth daily. 10/03/18   [provider]  Fluticasone-Salmeterol (ADVAIR) 250-50 MCG/DOSE AEPB Inhale 1 puff into the lungs every morning.     [provider]  HYDROcodone-acetaminophen (NORCO/VICODIN) 5-325 MG tablet Take 1 tablet by mouth every 6 (six) hours as needed for up to 2 days for severe pain. 12/15/19 12/17/19  Alveria Apley, PA-C  JARDIANCE 25 MG TABS tablet Take 25 mg by mouth daily.  09/14/14   [provider]  lisinopril-hydrochlorothiazide (PRINZIDE,ZESTORETIC) 20-12.5 MG per tablet Take 2 tablets by mouth every morning.     [provider]  loperamide (IMODIUM) 2 MG  capsule Take 2 mg by mouth as needed for diarrhea or loose stools.     [provider]  Magnesium 400 MG TABS Take 400 mg by mouth at bedtime.     [provider]  mercaptopurine (PURINETHOL) 50 MG tablet TAKE 1 AND 1/2 TABLETS BY MOUTH DAILY ON EMPTY STOMACH ONE HOUR PRIOR TO MEALS OR TWO HOURS AFTER MEALS. Patient taking differently: Take 75 mg by mouth daily.  05/31/19   [name], Mechele Dawley, MD  mesalamine (PENTASA) 500 MG CR capsule TAKE TWO (2) CAPSULES BY MOUTH FOUR TIMES DAILY 11/22/19   [name], Mechele Dawley, MD  Multiple Vitamin (MULITIVITAMIN WITH MINERALS) TABS Take 1 tablet by mouth every morning.     [provider]  rosuvastatin (CRESTOR) 5 MG tablet Take 5 mg by mouth once a week.  05/05/16   [provider]  spironolactone (ALDACTONE) 25 MG tablet Take 25 mg by mouth every morning.     [provider]  traZODone (DESYREL) 50 MG tablet Take 100 mg by mouth at bedtime. 06/26/19   [provider]    Allergies    Remicade [infliximab], Cefprozil, and Penicillins  Review of Systems   Review of Systems  Respiratory: Negative for shortness of breath.   Gastrointestinal: Negative for abdominal pain, nausea and vomiting.  Genitourinary: Negative for dysuria.  Musculoskeletal: Positive for arthralgias, back pain and gait problem.  Skin: Negative for rash and wound.  Neurological: Negative for numbness.    Physical Exam Updated Vital Signs BP 133/84   Pulse 81   Temp 99.1 F (37.3 C) (Oral)   Resp 18   Ht 5' 3"  (1.6 m)   Wt 126.6 kg   SpO2 96%   BMI 49.42 kg/m   Physical Exam Vitals and nursing note reviewed.  Constitutional:      General: She is not in acute distress.    Appearance: Normal appearance. She is not toxic-appearing or diaphoretic.     Comments: Appears uncomfortable and in pain  HENT:     Head: Normocephalic.  Eyes:     Conjunctiva/sclera: Conjunctivae normal.  Pulmonary:     Effort: Pulmonary effort is  normal.     Breath sounds: Normal breath sounds.  Skin:    General: Skin is dry.  Neurological:     Mental Status: She is alert.     Sensory: No sensory deficit.     Motor: No weakness.     Gait: Gait abnormal (stiffened ).     Deep Tendon Reflexes: Reflexes normal.     Comments: TTP lower right side sacrum and right groin. Normal sensation in LE  Psychiatric:        Mood and Affect: Mood normal.     ED Results / Procedures / Treatments   Labs (all labs ordered are listed, but only abnormal results are displayed) Labs Reviewed - No data to display  EKG None  Radiology DG Hip Unilat  With Pelvis 2-3 Views Right  Result Date: 12/15/2019 CLINICAL DATA:  Right hip pain. Progressive pain for 2 days. EXAM: DG HIP (WITH OR WITHOUT PELVIS) 2-3V RIGHT COMPARISON:  None. FINDINGS: Right hip arthroplasty in expected alignment. No periprosthetic lucency or fracture. There is nutrient channel in the medial proximal femoral cortex. Remainder of the bony pelvis is intact. The pubic rami are intact. Pubic symphysis and sacroiliac joints are congruent. Mild these a pathic changes about the iliac crest. IMPRESSION: Right hip arthroplasty in expected alignment without complication. Electronically Signed   By: Keith Rake M.D.   On: 12/15/2019 17:17    Procedures Procedures (including critical care time)  Medications Ordered in ED Medications  morphine 4 MG/ML injection 4 mg (4 mg Intramuscular Given 12/15/19 1744)  ketorolac (TORADOL) injection 60 mg (60 mg Intramuscular Given 12/15/19 1744)  cyclobenzaprine (FLEXERIL) tablet 5 mg (5 mg Oral Given 12/15/19 1744)    ED Course  I have reviewed the triage vital signs and the nursing notes.  Pertinent labs & imaging results that were available during my care of the patient were reviewed by me and considered in my medical decision making (see chart for details).  Clinical Course as of Dec 14 1808  Fri Dec 15, 2019  1735 Patient here for  right-sided hip and back pain. Appears to be musculoskeletal. Etiologies include SI joint dysfunction, right hip pain, lumbar spondylosis, lumbar radicular pain. She does not have any red flag symptoms including no weakness, decreased sensation, saddle anesthesia, fever, loss of control of bowel or bladder movements. She has good follow-up with neurosurgeon as well as orthopedics. We will treat her pain today. X-ray of her right hip was unremarkable. Discussed with patient return precautions and following up with proper specialist.   [KM]    Clinical Course User Index [KM] Kristine Royal   MDM Rules/Calculators/A&P                      Based on review of vitals, medical screening exam, lab work and/or imaging, there does not appear to be an acute, emergent etiology for the patient's symptoms. Counseled pt on good return precautions and encouraged both PCP and ED follow-up as needed.  Prior to discharge, I also discussed incidental imaging findings with patient in detail and advised appropriate, recommended follow-up in detail.  Clinical Impression: 1. Acute right-sided low back pain without sciatica   2. Right hip pain     Disposition: Discharge  Prior to providing a prescription for a controlled substance, I independently reviewed the patient's recent prescription history on the [place]. The patient had no recent or regular prescriptions and was deemed appropriate for a brief, less than 3 day prescription of narcotic for acute analgesia.  This note was prepared with assistance of Systems analyst. Occasional wrong-word or sound-a-like substitutions may have occurred due to the inherent limitations of voice recognition software.  Final Clinical Impression(s) / ED Diagnoses Final diagnoses:  Acute right-sided low back pain without sciatica  Right hip pain    Rx / DC Orders ED Discharge Orders         Ordered     HYDROcodone-acetaminophen (NORCO/VICODIN) 5-325 MG tablet  Every 6 hours PRN     12/15/19 1810    cyclobenzaprine (FLEXERIL) 10 MG tablet  2 times daily PRN     12/15/19 1810           Kristine Royal 12/15/19 1811    Hayden Rasmussen, MD 12/15/19 2235

## 2019-12-26 ENCOUNTER — Other Ambulatory Visit (INDEPENDENT_AMBULATORY_CARE_PROVIDER_SITE_OTHER): Payer: Self-pay | Admitting: Internal Medicine

## 2020-04-03 ENCOUNTER — Telehealth (INDEPENDENT_AMBULATORY_CARE_PROVIDER_SITE_OTHER): Payer: Self-pay | Admitting: *Deleted

## 2020-04-03 ENCOUNTER — Other Ambulatory Visit (INDEPENDENT_AMBULATORY_CARE_PROVIDER_SITE_OTHER): Payer: Self-pay | Admitting: *Deleted

## 2020-04-03 ENCOUNTER — Encounter (INDEPENDENT_AMBULATORY_CARE_PROVIDER_SITE_OTHER): Payer: Self-pay | Admitting: *Deleted

## 2020-04-03 ENCOUNTER — Encounter (INDEPENDENT_AMBULATORY_CARE_PROVIDER_SITE_OTHER): Payer: Self-pay | Admitting: Gastroenterology

## 2020-04-03 ENCOUNTER — Other Ambulatory Visit: Payer: Self-pay

## 2020-04-03 ENCOUNTER — Ambulatory Visit (INDEPENDENT_AMBULATORY_CARE_PROVIDER_SITE_OTHER): Payer: BC Managed Care – PPO | Admitting: Gastroenterology

## 2020-04-03 VITALS — BP 107/69 | HR 86 | Temp 99.0°F | Ht 63.0 in | Wt 282.3 lb

## 2020-04-03 DIAGNOSIS — K5010 Crohn's disease of large intestine without complications: Secondary | ICD-10-CM | POA: Diagnosis not present

## 2020-04-03 DIAGNOSIS — Z8639 Personal history of other endocrine, nutritional and metabolic disease: Secondary | ICD-10-CM | POA: Diagnosis not present

## 2020-04-03 MED ORDER — PLENVU 140 G PO SOLR: 1.0000 | Freq: Once | ORAL | 0 refills | Status: AC

## 2020-04-03 NOTE — Telephone Encounter (Addendum)
Patient needs Plenvu (copay card)

## 2020-04-03 NOTE — Patient Instructions (Signed)
We are checking labs and scheduling colonoscopy for evaluation.

## 2020-04-03 NOTE — Progress Notes (Signed)
Patient profile: [name] is a 54 y.o. female seen for evaluation of Crohn's . Last seen in clinic on 10/2019. Since last OV has had carpal tunnel surgery done bilaterally.  History of Present Illness: [name] is seen today for evaluation - typically having a BM twice a day, varies in consistency from 4-6 on Bristal stool scale, occasionally #7 if eats greasy food. No blood in stool or abd pain. Takes dicyclomine twice a day - usually morning and night dose routinely, will use a lunch dose PRN at times depending on diet. Last used imodium 2 months ago. Takes Mag for leg cramps nightly.   Occasional nausea (after certain foods such as pizza), no vomiting. No dysphagia. No GERD sx. No vomiting.   Trying to lose weight - on weight watchers.   Continues to have joint pain, most notably in shoulders and arms. Takes Advil 3x/week at bedtime w/ snack. Non smoker. No frequent alcohol.   She took Vit D supplement for about 4 weeks and has been off since early spring.  Wt Readings from Last 3 Encounters:  04/03/20 282 lb 4.8 oz (128.1 kg)  12/15/19 279 lb (126.6 kg)  11/15/19 284 lb 6.3 oz (129 kg)     Last Colonoscopy: Last Colonoscopy: 12/2014-Prep satisfactory. Focal erythema edema and erosions noted at ileocecal valve. Focal erythema edema and erosions noted admitted sigmoid colon. Few small hyperplastic appearing polyps noted at distal transverse colon and these were left alone. Mucosa of descending colon was normal. Patchy edema erythema and erosions noted involving mucosa of distal sigmoid colon. Diffuse changes of erythema friable mucosa and multiple erosions involving rectal mucosa. Soft sentinel's skin tags noted on digital exam.  Path with chronic active colitis on all 3 biopsies.    Past Medical History:  Past Medical History:  Diagnosis Date  . Anxiety    No medications currently  . Arthritis   . Asthma   . Bilateral carpal tunnel syndrome   . Crohn  disease ([place])   . Diabetes mellitus    diet controlled  . Heart murmur    mild  . High cholesterol   . Hypertension    pcp  Dr Coralyn Mark Quillian Qu[place]    [place]  . IBS (irritable bowel syndrome)   . Obesity   . Pneumonia 2011   History of  . Sleep apnea 03-2009 study   pt forgot to mention at pre admit visit.  uses c-pap brought her own  machine from home.   . Trigger thumb, right thumb     Problem List: Patient Active Problem List   Diagnosis Date Noted  . History of total hip arthroplasty, right 10/12/2019  . Trochanteric bursitis, right hip 10/12/2019  . Trigger thumb, right thumb 04/21/2018  . IBS (irritable bowel syndrome) 06/18/2015  . Obesity 10/08/2014  . Osteoarthritis of right hip 04/20/2012  . Hypertension 10/08/2011  . High cholesterol 10/08/2011  . Diabetes in pregnancy 10/08/2011  . Crohn disease ([place]) 10/08/2011    Past Surgical History: Past Surgical History:  Procedure Laterality Date  . CARPAL TUNNEL RELEASE Right 09/20/2019   Procedure: CARPAL TUNNEL RELEASE;  Surgeon: Hiram Gash, MD;  [facility];  Service: Orthopedics;  Laterality: Right;  . CARPAL TUNNEL RELEASE Left 11/15/2019   Procedure: CARPAL TUNNEL RELEASE, EXCISION OF GANGLION CYST;  Surgeon: Hiram Gash, MD;  [facility];  Service: Orthopedics;  Laterality: Left;  . CHOLECYSTECTOMY N/A 03/31/2013   Procedure: LAPAROSCOPIC CHOLECYSTECTOMY;  Surgeon:  Jamesetta So, MD;  Location: [facility];  Service: General;  Laterality: N/A;  . COLONOSCOPY N/A 03/16/2013   Procedure: COLONOSCOPY;  Surgeon: Rogene Houston, MD;  [facility];  Service: Endoscopy;  Laterality: N/A;  255  . COLONOSCOPY N/A 12/31/2014   Procedure: COLONOSCOPY;  Surgeon: Rogene Houston, MD;  [facility];  Service: Endoscopy;  Laterality: N/A;  730  . cyst removed from left hand    . FLEXIBLE SIGMOIDOSCOPY N/A 08/21/2014   Procedure: FLEXIBLE SIGMOIDOSCOPY;  Surgeon: Rogene Houston, MD;  [facility];   Service: Endoscopy;  Laterality: N/A;  1030  . TONSILLECTOMY    . TOTAL HIP ARTHROPLASTY  04/18/2012   Procedure: TOTAL HIP ARTHROPLASTY;  Surgeon: Marybelle Killings, MD;  Location: [facility];  Service: Orthopedics;  Laterality: Right;  Right Total Hip Arthroplasty    Allergies: Allergies  Allergen Reactions  . Remicade [Infliximab] Swelling    Joints locked up  . Cefprozil Rash  . Penicillins Rash    Did it involve swelling of the face/tongue/throat, SOB, or low BP? No Did it involve sudden or severe rash/hives, skin peeling, or any reaction on the inside of your mouth or nose? No Did you need to seek medical attention at a hospital or doctor's office? No When did it last happen? If all above answers are "NO", may proceed with cephalosporin use.       Home Medications:  Current Outpatient Medications:  .  albuterol (PROVENTIL,VENTOLIN) 90 MCG/ACT inhaler, Inhale 2 puffs into the lungs every 6 (six) hours as needed for wheezing or shortness of breath. , Disp: , Rfl:  .  Calcium Carb-Cholecalciferol (CALCIUM 600 + D PO), Take 1 tablet by mouth daily., Disp: , Rfl:  .  cetirizine (ZYRTEC) 10 MG tablet, Take 10 mg by mouth every evening. , Disp: , Rfl:  .  cholecalciferol (VITAMIN D3) 25 MCG (1000 UNIT) tablet, Take 1 tablet (1,000 Units total) by mouth daily., Disp: 30 tablet, Rfl: 6 .  dicyclomine (BENTYL) 20 MG tablet, TAKE ONE TABLET BY MOUTH THREE TIMES DAILY AS NEEDED FOR SPASMS (Patient taking differently: Take 20 mg by mouth 3 (three) times daily as needed for spasms. ), Disp: 90 tablet, Rfl: 5 .  Fluticasone-Salmeterol (ADVAIR) 250-50 MCG/DOSE AEPB, Inhale 1 puff into the lungs every morning. , Disp: , Rfl:  .  JARDIANCE 25 MG TABS tablet, Take 25 mg by mouth daily. , Disp: , Rfl:  .  lisinopril-hydrochlorothiazide (PRINZIDE,ZESTORETIC) 20-12.5 MG per tablet, Take 2 tablets by mouth every morning. , Disp: , Rfl:  .  loperamide (IMODIUM) 2 MG capsule, Take 2 mg by mouth as needed for  diarrhea or loose stools. , Disp: , Rfl:  .  Magnesium 400 MG TABS, Take 400 mg by mouth at bedtime. , Disp: , Rfl:  .  mercaptopurine (PURINETHOL) 50 MG tablet, TAKE 1 AND 1/2 TABLETS BY MOUTH DAILY ON EMPTY STOMACH ONE HOUR PRIOR TO MEALS OR TWO HOURS AFTER MEALS., Disp: 45 tablet, Rfl: 5 .  mesalamine (PENTASA) 500 MG CR capsule, TAKE TWO (2) CAPSULES BY MOUTH FOUR TIMES DAILY, Disp: 240 capsule, Rfl: 11 .  Multiple Vitamin (MULITIVITAMIN WITH MINERALS) TABS, Take 1 tablet by mouth every morning. , Disp: , Rfl:  .  Omega-3 Fatty Acids (OMEGA-3 EPA FISH OIL PO), Take by mouth. 1,000 1 cap once a day, Disp: , Rfl:  .  spironolactone (ALDACTONE) 25 MG tablet, Take 25 mg by mouth every  morning. , Disp: , Rfl:  .  traZODone (DESYREL) 50 MG tablet, Take 100 mg by mouth at bedtime., Disp: , Rfl:  .  diltiazem (TIAZAC) 360 MG 24 hr capsule, Take 360 mg by mouth every morning.  (Patient not taking: Reported on 04/03/2020), Disp: , Rfl:  .  DULoxetine (CYMBALTA) 60 MG capsule, Take 60 mg by mouth daily. (Patient not taking: Reported on 04/03/2020), Disp: , Rfl:  .  PEG-KCl-NaCl-NaSulf-Na Asc-C (PLENVU) 140 g SOLR, Take 1 kit by mouth once for 1 dose., Disp: 1 each, Rfl: 0 .  rosuvastatin (CRESTOR) 5 MG tablet, Take 5 mg by mouth once a week.  (Patient not taking: Reported on 04/03/2020), Disp: , Rfl:    Family History: family history is not on file.    Social History:   reports that she has never smoked. She has never used smokeless tobacco. She reports that she does not drink alcohol and does not use drugs.   Review of Systems: Constitutional: Denies weight loss/weight gain  Eyes: No changes in vision. ENT: No oral lesions, sore throat.  GI: see HPI.  Heme/Lymph: No easy bruising.  CV: No chest pain.  GU: No hematuria.  Integumentary: No rashes.  Neuro: No headaches.  Psych: No depression/anxiety.  Endocrine: No heat/cold intolerance.  Allergic/Immunologic: No urticaria.  Resp: No cough, SOB.    Musculoskeletal: No joint swelling.    Physical Examination: BP 107/69 (BP Location: Right Arm, Patient Position: Sitting, Cuff Size: Large)   Pulse 86   Temp 99 F (37.2 C) (Oral)   Ht 5' 3"  (1.6 m)   Wt 282 lb 4.8 oz (128.1 kg)   BMI 50.01 kg/m  Gen: NAD, alert and oriented x 4 HEENT: PEERLA, EOMI, Neck: supple, no JVD Chest: CTA bilaterally, no wheezes, crackles, or other adventitious sounds CV: RRR, no m/g/c/r Abd: soft, NT, ND, +BS in all four quadrants; no HSM, guarding, ridigity, or rebound tenderness Ext: no edema, well perfused with 2+ pulses, Skin: no rash or lesions noted on observed skin Lymph: no noted LAD  Data Reviewed:  February 2021-vitamin D 23.  B12 and folate normal.  CRP normal. 10/2019-BMP normal, CMP with MCV 103,   Assessment/Plan: Ms. Kearley is a 54 y.o. female with history of Crohn's colitis last colonoscopy 2016.   1.  Crohn's colitis - currently on 6-MP & pentasa and symptoms improved but still does have some looser stools at baseline despite dicyclomine BID-TID.  Due for 5-year colonoscopy to assess endoscopic disease status.  We will check her basic labs today as well as repeat vitamin D since it was low in February.  She takes dicyclomine routinely twice daily and will refill this today.  She has decreased NSAID some since her last visit recommend continue to decrease.   Randell was seen today for follow-up.  Diagnoses and all orders for this visit:  Crohn's disease of colon without complication ([place]) -     Vitamin D (25 hydroxy) -     COMPLETE METABOLIC PANEL WITH GFR -     CBC with Differential -     C-reactive protein -     Sed Rate (ESR)  History of vitamin D deficiency -     Vitamin D (25 hydroxy) -     COMPLETE METABOLIC PANEL WITH GFR -     CBC with Differential -     C-reactive protein -     Sed Rate (ESR)     Recommendations: Colonoscopy with propofol-history of sleep  apnea.   I personally performed the service,  non-incident to. (WP)  Laurine Blazer, [facility]

## 2020-04-04 LAB — CBC WITH DIFFERENTIAL/PLATELET
Absolute Monocytes: 554 cells/uL (ref 200–950)
Basophils Absolute: 26 cells/uL (ref 0–200)
Basophils Relative: 0.3 %
Eosinophils Absolute: 132 cells/uL (ref 15–500)
Eosinophils Relative: 1.5 %
HCT: 44.8 % (ref 35.0–45.0)
Hemoglobin: 15.4 g/dL (ref 11.7–15.5)
Lymphs Abs: 1857 cells/uL (ref 850–3900)
MCH: 34.1 pg — ABNORMAL HIGH (ref 27.0–33.0)
MCHC: 34.4 g/dL (ref 32.0–36.0)
MCV: 99.3 fL (ref 80.0–100.0)
MPV: 10.1 fL (ref 7.5–12.5)
Monocytes Relative: 6.3 %
Neutro Abs: 6230 cells/uL (ref 1500–7800)
Neutrophils Relative %: 70.8 %
Platelets: 275 10*3/uL (ref 140–400)
RBC: 4.51 10*6/uL (ref 3.80–5.10)
RDW: 11.8 % (ref 11.0–15.0)
Total Lymphocyte: 21.1 %
WBC: 8.8 10*3/uL (ref 3.8–10.8)

## 2020-04-04 LAB — COMPLETE METABOLIC PANEL WITH GFR
AG Ratio: 1.4 (calc) (ref 1.0–2.5)
ALT: 17 U/L (ref 6–29)
AST: 15 U/L (ref 10–35)
Albumin: 4 g/dL (ref 3.6–5.1)
Alkaline phosphatase (APISO): 82 U/L (ref 37–153)
BUN: 19 mg/dL (ref 7–25)
CO2: 29 mmol/L (ref 20–32)
Calcium: 9.1 mg/dL (ref 8.6–10.4)
Chloride: 100 mmol/L (ref 98–110)
Creat: 0.74 mg/dL (ref 0.50–1.05)
GFR, Est African American: 107 mL/min/{1.73_m2} (ref >=60)
GFR, Est Non African American: 92 mL/min/{1.73_m2} (ref >=60)
Globulin: 2.8 g/dL (calc) (ref 1.9–3.7)
Glucose, Bld: 186 mg/dL — ABNORMAL HIGH (ref 65–139)
Potassium: 3.8 mmol/L (ref 3.5–5.3)
Sodium: 137 mmol/L (ref 135–146)
Total Bilirubin: 0.7 mg/dL (ref 0.2–1.2)
Total Protein: 6.8 g/dL (ref 6.1–8.1)

## 2020-04-04 LAB — SEDIMENTATION RATE: Sed Rate: 19 mm/h (ref 0–30)

## 2020-04-04 LAB — C-REACTIVE PROTEIN: CRP: 9.6 mg/L — ABNORMAL HIGH (ref <8.0)

## 2020-04-04 LAB — VITAMIN D 25 HYDROXY (VIT D DEFICIENCY, FRACTURES): Vit D, 25-Hydroxy: 33 ng/mL (ref 30–100)

## 2020-04-08 DIAGNOSIS — U071 COVID-19: Secondary | ICD-10-CM

## 2020-04-08 HISTORY — DX: COVID-19: U07.1

## 2020-04-20 ENCOUNTER — Inpatient Hospital Stay (HOSPITAL_COMMUNITY)
Admission: AD | Admit: 2020-04-20 | Discharge: 2020-05-16 | DRG: 207 | Disposition: A | Discharge status: Skilled Nursing Facility | Payer: BC Managed Care – PPO | Source: Other Acute Inpatient Hospital | Attending: Internal Medicine | Admitting: Internal Medicine

## 2020-04-20 DIAGNOSIS — J80 Acute respiratory distress syndrome: Secondary | ICD-10-CM | POA: Diagnosis not present

## 2020-04-20 DIAGNOSIS — J9690 Respiratory failure, unspecified, unspecified whether with hypoxia or hypercapnia: Secondary | ICD-10-CM

## 2020-04-20 DIAGNOSIS — R5381 Other malaise: Secondary | ICD-10-CM | POA: Diagnosis present

## 2020-04-20 DIAGNOSIS — K5900 Constipation, unspecified: Secondary | ICD-10-CM | POA: Diagnosis present

## 2020-04-20 DIAGNOSIS — E785 Hyperlipidemia, unspecified: Secondary | ICD-10-CM | POA: Diagnosis present

## 2020-04-20 DIAGNOSIS — J1282 Pneumonia due to coronavirus disease 2019: Secondary | ICD-10-CM | POA: Diagnosis present

## 2020-04-20 DIAGNOSIS — Z96641 Presence of right artificial hip joint: Secondary | ICD-10-CM | POA: Diagnosis present

## 2020-04-20 DIAGNOSIS — T380X5A Adverse effect of glucocorticoids and synthetic analogues, initial encounter: Secondary | ICD-10-CM | POA: Diagnosis present

## 2020-04-20 DIAGNOSIS — I10 Essential (primary) hypertension: Secondary | ICD-10-CM | POA: Diagnosis present

## 2020-04-20 DIAGNOSIS — E876 Hypokalemia: Secondary | ICD-10-CM | POA: Diagnosis present

## 2020-04-20 DIAGNOSIS — J440 Chronic obstructive pulmonary disease with acute lower respiratory infection: Secondary | ICD-10-CM | POA: Diagnosis present

## 2020-04-20 DIAGNOSIS — E871 Hypo-osmolality and hyponatremia: Secondary | ICD-10-CM | POA: Diagnosis not present

## 2020-04-20 DIAGNOSIS — N179 Acute kidney failure, unspecified: Secondary | ICD-10-CM | POA: Diagnosis not present

## 2020-04-20 DIAGNOSIS — D8489 Other immunodeficiencies: Secondary | ICD-10-CM | POA: Diagnosis present

## 2020-04-20 DIAGNOSIS — E781 Pure hyperglyceridemia: Secondary | ICD-10-CM | POA: Diagnosis present

## 2020-04-20 DIAGNOSIS — K589 Irritable bowel syndrome without diarrhea: Secondary | ICD-10-CM | POA: Diagnosis present

## 2020-04-20 DIAGNOSIS — R1310 Dysphagia, unspecified: Secondary | ICD-10-CM

## 2020-04-20 DIAGNOSIS — Z978 Presence of other specified devices: Secondary | ICD-10-CM

## 2020-04-20 DIAGNOSIS — R0603 Acute respiratory distress: Secondary | ICD-10-CM

## 2020-04-20 DIAGNOSIS — J9600 Acute respiratory failure, unspecified whether with hypoxia or hypercapnia: Secondary | ICD-10-CM

## 2020-04-20 DIAGNOSIS — J9601 Acute respiratory failure with hypoxia: Secondary | ICD-10-CM | POA: Diagnosis not present

## 2020-04-20 DIAGNOSIS — B957 Other staphylococcus as the cause of diseases classified elsewhere: Secondary | ICD-10-CM | POA: Diagnosis present

## 2020-04-20 DIAGNOSIS — Z0189 Encounter for other specified special examinations: Secondary | ICD-10-CM

## 2020-04-20 DIAGNOSIS — Z79899 Other long term (current) drug therapy: Secondary | ICD-10-CM

## 2020-04-20 DIAGNOSIS — E1165 Type 2 diabetes mellitus with hyperglycemia: Secondary | ICD-10-CM | POA: Diagnosis present

## 2020-04-20 DIAGNOSIS — E8770 Fluid overload, unspecified: Secondary | ICD-10-CM | POA: Diagnosis not present

## 2020-04-20 DIAGNOSIS — G9341 Metabolic encephalopathy: Secondary | ICD-10-CM | POA: Diagnosis present

## 2020-04-20 DIAGNOSIS — F329 Major depressive disorder, single episode, unspecified: Secondary | ICD-10-CM | POA: Diagnosis present

## 2020-04-20 DIAGNOSIS — K580 Irritable bowel syndrome with diarrhea: Secondary | ICD-10-CM | POA: Diagnosis not present

## 2020-04-20 DIAGNOSIS — K50919 Crohn's disease, unspecified, with unspecified complications: Secondary | ICD-10-CM | POA: Diagnosis not present

## 2020-04-20 DIAGNOSIS — E1159 Type 2 diabetes mellitus with other circulatory complications: Secondary | ICD-10-CM | POA: Diagnosis present

## 2020-04-20 DIAGNOSIS — G4733 Obstructive sleep apnea (adult) (pediatric): Secondary | ICD-10-CM | POA: Diagnosis present

## 2020-04-20 DIAGNOSIS — N390 Urinary tract infection, site not specified: Secondary | ICD-10-CM | POA: Diagnosis not present

## 2020-04-20 DIAGNOSIS — Z6841 Body Mass Index (BMI) 40.0 and over, adult: Secondary | ICD-10-CM | POA: Diagnosis not present

## 2020-04-20 DIAGNOSIS — U071 COVID-19: Principal | ICD-10-CM | POA: Diagnosis present

## 2020-04-20 DIAGNOSIS — K5090 Crohn's disease, unspecified, without complications: Secondary | ICD-10-CM | POA: Diagnosis present

## 2020-04-20 DIAGNOSIS — R1312 Dysphagia, oropharyngeal phase: Secondary | ICD-10-CM | POA: Diagnosis not present

## 2020-04-20 DIAGNOSIS — E870 Hyperosmolality and hypernatremia: Secondary | ICD-10-CM | POA: Diagnosis not present

## 2020-04-20 DIAGNOSIS — I152 Hypertension secondary to endocrine disorders: Secondary | ICD-10-CM | POA: Diagnosis present

## 2020-04-20 DIAGNOSIS — E878 Other disorders of electrolyte and fluid balance, not elsewhere classified: Secondary | ICD-10-CM | POA: Diagnosis not present

## 2020-04-20 DIAGNOSIS — L89151 Pressure ulcer of sacral region, stage 1: Secondary | ICD-10-CM | POA: Diagnosis present

## 2020-04-21 ENCOUNTER — Other Ambulatory Visit: Payer: Self-pay

## 2020-04-21 ENCOUNTER — Inpatient Hospital Stay (HOSPITAL_COMMUNITY): Payer: BC Managed Care – PPO

## 2020-04-21 ENCOUNTER — Encounter (HOSPITAL_COMMUNITY): Payer: Self-pay | Admitting: Pulmonary Disease

## 2020-04-21 DIAGNOSIS — J80 Acute respiratory distress syndrome: Secondary | ICD-10-CM | POA: Diagnosis present

## 2020-04-21 DIAGNOSIS — U071 COVID-19: Principal | ICD-10-CM

## 2020-04-21 LAB — COMPREHENSIVE METABOLIC PANEL
ALT: 36 U/L (ref 0–44)
AST: 37 U/L (ref 15–41)
Albumin: 2.1 g/dL — ABNORMAL LOW (ref 3.5–5.0)
Alkaline Phosphatase: 95 U/L (ref 38–126)
Anion gap: 8 (ref 5–15)
BUN: 14 mg/dL (ref 6–20)
CO2: 25 mmol/L (ref 22–32)
Calcium: 7.4 mg/dL — ABNORMAL LOW (ref 8.9–10.3)
Chloride: 103 mmol/L (ref 98–111)
Creatinine, Ser: 0.56 mg/dL (ref 0.44–1.00)
GFR calc Af Amer: >60 mL/min (ref >60)
GFR calc non Af Amer: >60 mL/min (ref >60)
Glucose, Bld: 187 mg/dL — ABNORMAL HIGH (ref 70–99)
Potassium: 3.3 mmol/L — ABNORMAL LOW (ref 3.5–5.1)
Sodium: 136 mmol/L (ref 135–145)
Total Bilirubin: 0.9 mg/dL (ref 0.3–1.2)
Total Protein: 5.1 g/dL — ABNORMAL LOW (ref 6.5–8.1)

## 2020-04-21 LAB — BLOOD GAS, ARTERIAL
Acid-Base Excess: 1.8 mmol/L (ref 0.0–2.0)
Acid-Base Excess: 0.1 mmol/L (ref 0.0–2.0)
Bicarbonate: 26.8 mmol/L (ref 20.0–28.0)
Bicarbonate: 26.1 mmol/L (ref 20.0–28.0)
Drawn by: 11249
Drawn by: 11249
FIO2: 100
FIO2: 100
MECHVT: 470 mL
MECHVT: 320 mL
O2 Saturation: 91.6 %
O2 Saturation: 95.7 %
PEEP: 12 cmH2O
PEEP: 12 cmH2O
Patient temperature: 102.8
Patient temperature: 100.8
RATE: 20 resp/min
RATE: 30 resp/min
pCO2 arterial: 51.2 mmHg — ABNORMAL HIGH (ref 32.0–48.0)
pCO2 arterial: 53.6 mmHg — ABNORMAL HIGH (ref 32.0–48.0)
pH, Arterial: 7.352 (ref 7.350–7.450)
pH, Arterial: 7.316 — ABNORMAL LOW (ref 7.350–7.450)
pO2, Arterial: 70.3 mmHg — ABNORMAL LOW (ref 83.0–108.0)
pO2, Arterial: 86.2 mmHg (ref 83.0–108.0)

## 2020-04-21 LAB — CBC
HCT: 35.9 % — ABNORMAL LOW (ref 36.0–46.0)
Hemoglobin: 12.7 g/dL (ref 12.0–15.0)
MCH: 36.4 pg — ABNORMAL HIGH (ref 26.0–34.0)
MCHC: 35.4 g/dL (ref 30.0–36.0)
MCV: 102.9 fL — ABNORMAL HIGH (ref 80.0–100.0)
Platelets: 196 10*3/uL (ref 150–400)
RBC: 3.49 MIL/uL — ABNORMAL LOW (ref 3.87–5.11)
RDW: 12 % (ref 11.5–15.5)
WBC: 8.7 10*3/uL (ref 4.0–10.5)
nRBC: 0 % (ref 0.0–0.2)

## 2020-04-21 LAB — CREATININE, SERUM
Creatinine, Ser: 0.67 mg/dL (ref 0.44–1.00)
GFR calc Af Amer: >60 mL/min (ref >60)
GFR calc non Af Amer: >60 mL/min (ref >60)

## 2020-04-21 LAB — GLUCOSE, CAPILLARY
Glucose-Capillary: 101 mg/dL — ABNORMAL HIGH (ref 70–99)
Glucose-Capillary: 109 mg/dL — ABNORMAL HIGH (ref 70–99)
Glucose-Capillary: 192 mg/dL — ABNORMAL HIGH (ref 70–99)
Glucose-Capillary: 193 mg/dL — ABNORMAL HIGH (ref 70–99)
Glucose-Capillary: 213 mg/dL — ABNORMAL HIGH (ref 70–99)
Glucose-Capillary: 231 mg/dL — ABNORMAL HIGH (ref 70–99)

## 2020-04-21 LAB — ABO/RH: ABO/RH(D): A POS

## 2020-04-21 LAB — MAGNESIUM
Magnesium: 2.1 mg/dL (ref 1.7–2.4)
Magnesium: 2.3 mg/dL (ref 1.7–2.4)

## 2020-04-21 LAB — TRIGLYCERIDES
Triglycerides: 1937 mg/dL — ABNORMAL HIGH (ref <150)
Triglycerides: 1002 mg/dL — ABNORMAL HIGH (ref <150)

## 2020-04-21 LAB — PHOSPHORUS
Phosphorus: 2.6 mg/dL (ref 2.5–4.6)
Phosphorus: 2.4 mg/dL — ABNORMAL LOW (ref 2.5–4.6)

## 2020-04-21 LAB — HIV ANTIBODY (ROUTINE TESTING W REFLEX): HIV Screen 4th Generation wRfx: NONREACTIVE

## 2020-04-21 MED ORDER — CHLORHEXIDINE GLUCONATE 0.12% ORAL RINSE (MEDLINE KIT): 15.0000 mL | Freq: Two times a day (BID) | OROMUCOSAL | Status: DC

## 2020-04-21 MED ORDER — ORAL CARE MOUTH RINSE: 15.0000 mL | OROMUCOSAL | Status: DC

## 2020-04-21 MED ORDER — PROPOFOL 1000 MG/100ML IV EMUL
0.0000 ug/kg/min | INTRAVENOUS | Status: DC
  Administered 2020-04-21: 35 ug/kg/min via INTRAVENOUS
  Administered 2020-04-21: 45 ug/kg/min via INTRAVENOUS
  Filled 2020-04-21 (×3): qty 100

## 2020-04-21 MED ORDER — INSULIN ASPART 100 UNIT/ML ~~LOC~~ SOLN
0.0000 [IU] | SUBCUTANEOUS | Status: DC
  Administered 2020-04-21 (×2): 4 [IU] via SUBCUTANEOUS
  Administered 2020-04-21 (×2): 7 [IU] via SUBCUTANEOUS
  Administered 2020-04-22: 4 [IU] via SUBCUTANEOUS
  Administered 2020-04-22: 3 [IU] via SUBCUTANEOUS
  Administered 2020-04-22: 7 [IU] via SUBCUTANEOUS
  Administered 2020-04-22: 4 [IU] via SUBCUTANEOUS
  Administered 2020-04-22: 7 [IU] via SUBCUTANEOUS
  Administered 2020-04-22: 4 [IU] via SUBCUTANEOUS
  Administered 2020-04-22: 3 [IU] via SUBCUTANEOUS
  Administered 2020-04-23 (×6): 4 [IU] via SUBCUTANEOUS
  Administered 2020-04-24 – 2020-04-25 (×9): 7 [IU] via SUBCUTANEOUS
  Administered 2020-04-25: 15 [IU] via SUBCUTANEOUS
  Administered 2020-04-25: 7 [IU] via SUBCUTANEOUS
  Administered 2020-04-26: 11 [IU] via SUBCUTANEOUS
  Administered 2020-04-26: 7 [IU] via SUBCUTANEOUS
  Administered 2020-04-26 (×3): 11 [IU] via SUBCUTANEOUS
  Administered 2020-04-26: 4 [IU] via SUBCUTANEOUS
  Administered 2020-04-27 (×2): 11 [IU] via SUBCUTANEOUS
  Administered 2020-04-27: 4 [IU] via SUBCUTANEOUS
  Administered 2020-04-27: 7 [IU] via SUBCUTANEOUS
  Administered 2020-04-27: 4 [IU] via SUBCUTANEOUS
  Administered 2020-04-27: 11 [IU] via SUBCUTANEOUS
  Administered 2020-04-28: 7 [IU] via SUBCUTANEOUS
  Administered 2020-04-28: 11 [IU] via SUBCUTANEOUS
  Administered 2020-04-28: 7 [IU] via SUBCUTANEOUS
  Administered 2020-04-28 (×2): 15 [IU] via SUBCUTANEOUS
  Administered 2020-04-28: 4 [IU] via SUBCUTANEOUS
  Administered 2020-04-29 (×5): 7 [IU] via SUBCUTANEOUS
  Administered 2020-04-29: 4 [IU] via SUBCUTANEOUS
  Administered 2020-04-29: 7 [IU] via SUBCUTANEOUS
  Administered 2020-04-30: 4 [IU] via SUBCUTANEOUS
  Administered 2020-04-30 (×2): 7 [IU] via SUBCUTANEOUS
  Administered 2020-04-30: 4 [IU] via SUBCUTANEOUS
  Administered 2020-04-30 (×2): 7 [IU] via SUBCUTANEOUS
  Administered 2020-05-01: 4 [IU] via SUBCUTANEOUS
  Administered 2020-05-01: 3 [IU] via SUBCUTANEOUS
  Administered 2020-05-01: 4 [IU] via SUBCUTANEOUS
  Administered 2020-05-01: 7 [IU] via SUBCUTANEOUS
  Administered 2020-05-01 – 2020-05-02 (×4): 4 [IU] via SUBCUTANEOUS
  Administered 2020-05-02: 11 [IU] via SUBCUTANEOUS
  Administered 2020-05-02 – 2020-05-03 (×3): 4 [IU] via SUBCUTANEOUS
  Administered 2020-05-03: 3 [IU] via SUBCUTANEOUS
  Administered 2020-05-03: 4 [IU] via SUBCUTANEOUS
  Administered 2020-05-03: 7 [IU] via SUBCUTANEOUS
  Administered 2020-05-03: 4 [IU] via SUBCUTANEOUS
  Administered 2020-05-03: 7 [IU] via SUBCUTANEOUS
  Administered 2020-05-03: 11 [IU] via SUBCUTANEOUS
  Administered 2020-05-04: 3 [IU] via SUBCUTANEOUS
  Administered 2020-05-04 (×2): 4 [IU] via SUBCUTANEOUS
  Administered 2020-05-04: 7 [IU] via SUBCUTANEOUS
  Administered 2020-05-04: 4 [IU] via SUBCUTANEOUS
  Administered 2020-05-04: 7 [IU] via SUBCUTANEOUS
  Administered 2020-05-05: 3 [IU] via SUBCUTANEOUS
  Administered 2020-05-05: 4 [IU] via SUBCUTANEOUS
  Administered 2020-05-05 – 2020-05-06 (×5): 3 [IU] via SUBCUTANEOUS
  Administered 2020-05-06: 7 [IU] via SUBCUTANEOUS
  Administered 2020-05-06 – 2020-05-07 (×3): 3 [IU] via SUBCUTANEOUS

## 2020-04-21 MED ORDER — DOCUSATE SODIUM 50 MG/5ML PO LIQD: 100.0000 mg | Freq: Every day | ORAL | Status: DC | PRN

## 2020-04-21 MED ORDER — SODIUM CHLORIDE 0.9 % IV SOLN
200.0000 mg | Freq: Once | INTRAVENOUS | Status: AC
  Administered 2020-04-21: 200 mg via INTRAVENOUS
  Filled 2020-04-21: qty 200

## 2020-04-21 MED ORDER — METHYLPREDNISOLONE SODIUM SUCC 125 MG IJ SOLR
0.5000 mg/kg | Freq: Two times a day (BID) | INTRAMUSCULAR | Status: AC
  Administered 2020-04-21 – 2020-04-30 (×20): 63.75 mg via INTRAVENOUS
  Filled 2020-04-21 (×17): qty 2

## 2020-04-21 MED ORDER — ORAL CARE MOUTH RINSE
15.0000 mL | OROMUCOSAL | Status: DC
  Administered 2020-04-21 – 2020-05-04 (×128): 15 mL via OROMUCOSAL

## 2020-04-21 MED ORDER — PANTOPRAZOLE SODIUM 40 MG PO PACK
40.0000 mg | PACK | ORAL | Status: DC
  Administered 2020-04-21: 40 mg
  Filled 2020-04-21: qty 20

## 2020-04-21 MED ORDER — MIDAZOLAM HCL 2 MG/2ML IJ SOLN: 2.0000 mg | INTRAMUSCULAR | Status: DC | PRN

## 2020-04-21 MED ORDER — VITAMIN D 25 MCG (1000 UNIT) PO TABS
1000.0000 [IU] | ORAL_TABLET | Freq: Every day | ORAL | Status: DC
  Administered 2020-04-22 – 2020-05-13 (×22): 1000 [IU]
  Filled 2020-04-21 (×23): qty 1

## 2020-04-21 MED ORDER — PROSOURCE TF PO LIQD
45.0000 mL | Freq: Two times a day (BID) | ORAL | Status: DC
  Administered 2020-04-21 (×2): 45 mL
  Filled 2020-04-21 (×2): qty 45

## 2020-04-21 MED ORDER — FENTANYL CITRATE (PF) 100 MCG/2ML IJ SOLN
50.0000 ug | INTRAMUSCULAR | Status: DC | PRN
  Administered 2020-04-21: 50 ug via INTRAVENOUS
  Administered 2020-04-21: 100 ug via INTRAVENOUS
  Administered 2020-04-21: 50 ug via INTRAVENOUS
  Administered 2020-04-21: 100 ug via INTRAVENOUS
  Filled 2020-04-21: qty 4
  Filled 2020-04-21 (×3): qty 2

## 2020-04-21 MED ORDER — FENTANYL 2500MCG IN NS 250ML (10MCG/ML) PREMIX INFUSION
0.0000 ug/h | INTRAVENOUS | Status: DC
  Administered 2020-04-21: 25 ug/h via INTRAVENOUS
  Administered 2020-04-21: 175 ug/h via INTRAVENOUS
  Administered 2020-04-22: 200 ug/h via INTRAVENOUS
  Filled 2020-04-21 (×3): qty 250

## 2020-04-21 MED ORDER — PROPOFOL 1000 MG/100ML IV EMUL
5.0000 ug/kg/min | INTRAVENOUS | Status: DC
  Administered 2020-04-21: 45 ug/kg/min via INTRAVENOUS

## 2020-04-21 MED ORDER — CHLORHEXIDINE GLUCONATE CLOTH 2 % EX PADS
6.0000 | MEDICATED_PAD | Freq: Every day | CUTANEOUS | Status: DC
  Administered 2020-04-21 – 2020-05-16 (×27): 6 via TOPICAL

## 2020-04-21 MED ORDER — ENOXAPARIN SODIUM 60 MG/0.6ML ~~LOC~~ SOLN
60.0000 mg | SUBCUTANEOUS | Status: DC
  Administered 2020-04-21: 60 mg via SUBCUTANEOUS
  Filled 2020-04-21 (×2): qty 0.6

## 2020-04-21 MED ORDER — DEXMEDETOMIDINE HCL IN NACL 200 MCG/50ML IV SOLN
0.0000 ug/kg/h | INTRAVENOUS | Status: DC
  Administered 2020-04-21: 0.4 ug/kg/h via INTRAVENOUS
  Administered 2020-04-21: 0.3 ug/kg/h via INTRAVENOUS
  Administered 2020-04-21: 0.6 ug/kg/h via INTRAVENOUS
  Administered 2020-04-21: 0.4 ug/kg/h via INTRAVENOUS
  Administered 2020-04-21: 0.8 ug/kg/h via INTRAVENOUS
  Administered 2020-04-22 (×2): 1.2 ug/kg/h via INTRAVENOUS
  Filled 2020-04-21: qty 100
  Filled 2020-04-21 (×6): qty 50
  Filled 2020-04-21: qty 100

## 2020-04-21 MED ORDER — VITAMIN D 25 MCG (1000 UNIT) PO TABS: 1000.0000 [IU] | ORAL_TABLET | Freq: Every day | ORAL | Status: DC

## 2020-04-21 MED ORDER — LINAGLIPTIN 5 MG PO TABS
5.0000 mg | ORAL_TABLET | Freq: Every day | ORAL | Status: DC
  Administered 2020-04-21 – 2020-04-23 (×3): 5 mg via ORAL
  Filled 2020-04-21 (×3): qty 1

## 2020-04-21 MED ORDER — ADULT MULTIVITAMIN W/MINERALS CH
1.0000 | ORAL_TABLET | Freq: Every day | ORAL | Status: DC
  Administered 2020-04-22 – 2020-04-26 (×5): 1
  Filled 2020-04-21 (×5): qty 1

## 2020-04-21 MED ORDER — INSULIN DETEMIR 100 UNIT/ML ~~LOC~~ SOLN
0.1500 [IU]/kg | Freq: Two times a day (BID) | SUBCUTANEOUS | Status: DC
  Administered 2020-04-21 – 2020-05-06 (×31): 19 [IU] via SUBCUTANEOUS
  Filled 2020-04-21 (×33): qty 0.19

## 2020-04-21 MED ORDER — VITAL HIGH PROTEIN PO LIQD
1000.0000 mL | ORAL | Status: DC
  Administered 2020-04-21: 1000 mL

## 2020-04-21 MED ORDER — SODIUM CHLORIDE 0.9% FLUSH
3.0000 mL | Freq: Two times a day (BID) | INTRAVENOUS | Status: DC
  Administered 2020-04-21 – 2020-04-26 (×12): 3 mL via INTRAVENOUS
  Administered 2020-04-26 – 2020-04-27 (×2): 10 mL via INTRAVENOUS
  Administered 2020-04-27 – 2020-05-16 (×37): 3 mL via INTRAVENOUS

## 2020-04-21 MED ORDER — PANTOPRAZOLE SODIUM 40 MG IV SOLR
40.0000 mg | Freq: Every day | INTRAVENOUS | Status: DC
  Administered 2020-04-21: 40 mg via INTRAVENOUS
  Filled 2020-04-21: qty 40

## 2020-04-21 MED ORDER — ADULT MULTIVITAMIN W/MINERALS CH
1.0000 | ORAL_TABLET | Freq: Every day | ORAL | Status: DC
  Administered 2020-04-21: 1 via ORAL
  Filled 2020-04-21: qty 1

## 2020-04-21 MED ORDER — ACETAMINOPHEN 160 MG/5ML PO SOLN
325.0000 mg | ORAL | Status: DC | PRN
  Administered 2020-04-21 – 2020-05-08 (×12): 325 mg
  Filled 2020-04-21 (×12): qty 20.3

## 2020-04-21 MED ORDER — DOCUSATE SODIUM 50 MG/5ML PO LIQD
100.0000 mg | Freq: Two times a day (BID) | ORAL | Status: DC
  Administered 2020-04-21: 100 mg via ORAL
  Filled 2020-04-21: qty 10

## 2020-04-21 MED ORDER — MIDAZOLAM HCL 2 MG/2ML IJ SOLN
2.0000 mg | INTRAMUSCULAR | Status: DC | PRN
  Administered 2020-04-22: 2 mg via INTRAVENOUS
  Filled 2020-04-21: qty 2

## 2020-04-21 MED ORDER — SODIUM CHLORIDE 0.9 % IV SOLN
100.0000 mg | Freq: Every day | INTRAVENOUS | Status: DC
  Filled 2020-04-21: qty 20

## 2020-04-21 MED ORDER — CHLORHEXIDINE GLUCONATE 0.12% ORAL RINSE (MEDLINE KIT)
15.0000 mL | Freq: Two times a day (BID) | OROMUCOSAL | Status: DC
  Administered 2020-04-21 – 2020-05-04 (×27): 15 mL via OROMUCOSAL

## 2020-04-21 NOTE — Progress Notes (Signed)
NAME:  [name], MRN:  [id number], DOB:  1965-12-07, LOS: 1 ADMISSION DATE:  04/20/2020, CONSULTATION DATE:  04/21/20 REFERRING MD:  , CHIEF COMPLAINT: short of breath  Brief History   54 yo female tested positive for COVID 19 on 8/09.  Had progressive symptoms and admitted to [facility] on 8/17.  Treated with remdesivir, prednisone and ABx for CAP.  She developed worsening hypoxia and required intubation.  She was transferred from [facility] with VDRF from [place] 19 pneumonia with ARDS.  Past Medical History  Crohn's colitis, Carpal tunnel, Osteoarthritis, DM type 2, Asthma, HTN, HLD, OSA, Morbid obesity with BMI 49.64, Depression  Significant Hospital Events   8/22 transfer from [place]:    Procedures:  ETT 8/21 >>   Significant Diagnostic Tests:    Micro Data:  COVID 8/09 >> positive COVID 8/17 >> positive MRSA PCR 8/22 >> Blood 8/22 >> Sputum 8/22 >>  Antimicrobials:  Remdesivir 8/17 >> Steroids 8/17 >>   Interim history/subjective:  Remains on full vent support, sedation.  Objective   Blood pressure (!) 130/55, pulse 69, temperature 98.8 F (37.1 C), temperature source Oral, resp. rate (!) 21, height 5' 3"  (1.6 m), weight 127.1 kg, SpO2 99 %.    Vent Mode: PRVC FiO2 (%):  [50 %-100 %] 50 % Set Rate:  [20 bmp-30 bmp] 30 bmp Vt Set:  [320 mL-470 mL] 320 mL PEEP:  [12 cmH20] 12 cmH20 Plateau Pressure:  [26 cmH20] 26 cmH20   Intake/Output Summary (Last 24 hours) at 04/21/2020 1114 Last data filed at 04/21/2020 5465 Gross per 24 hour  Intake 442.6 ml  Output 1150 ml  Net -707.4 ml   Filed Weights   04/21/20 0000 04/21/20 0500  Weight: 127.1 kg 127.1 kg    Examination:  General - sedated Eyes - pupils reactive ENT - ETT in place Cardiac - regular rate/rhythm, no murmur Chest - b/l crackles Abdomen - soft, non tender, + bowel sounds Extremities - no cyanosis, clubbing, or edema Skin - no rashes Neuro - RASS  -2  Resolved Hospital Problem list     Assessment & Plan:   Acute hypoxic/hypercapnic respiratory failure with ARDS from COVID 19 pneumonia. Hx of asthma, obstructive sleep apnea. - goal SpO2 88 to 95% - goal Vt 6 cc/kg - f/u CXR, ABG - day 6 of solumedrol - should be completing remedesivir - pulmicort, brovana, prn albutero - not candidate for tocilizumab or baricitinab since she has been on immunosuppressive therapy for Crohn's colitis  Acute metabolic encephalopathy 2nd to hypoxia, hypercapnia. Hx of depression. - RASS goal -2 - hold outpt  trazodone  Hx of Crohn's colitis. - hold outpt mercaptopurine, mesalamine - if she develops significant GI symptoms, them might need GI assessment >> followed by GI in [place]  Hx of HTN. - hold outpt  lisinopril, HCTZ, aldactone  DM type 2 poorly controlled with steroid induced hyperglycemia. - SSI with levemir and tradjenta  Hypokalemia. - f/u BMET  Hypertriglyceridemia. - from propofol - f/u triglyceride level  Best practice:  Diet: tube feeds DVT prophylaxis: enoxaparin GI prophylaxis: protonix  Mobility: bed Code Status: full  Disposition: ICU  Labs    CMP Latest Ref Rng & Units 04/21/2020 04/03/2020 11/08/2019  Glucose 70 - 99 mg/dL 187(H) 186(H) 107(H)  BUN 6 - 20 mg/dL 14 19 18   Creatinine 0.44 - 1.00 mg/dL 0.56 0.74 0.73  Sodium 135 - 145 mmol/L 136 137 138  Potassium 3.5 - 5.1  mmol/L 3.3(L) 3.8 4.4  Chloride 98 - 111 mmol/L 103 100 103  CO2 22 - 32 mmol/L 25 29 27   Calcium 8.9 - 10.3 mg/dL 7.4(L) 9.1 9.2  Total Protein 6.5 - 8.1 g/dL 5.1(L) 6.8 -  Total Bilirubin 0.3 - 1.2 mg/dL 0.9 0.7 -  Alkaline Phos 38 - 126 U/L 95 - -  AST 15 - 41 U/L 37 15 -  ALT 0 - 44 U/L 36 17 -    CBC Latest Ref Rng & Units 04/21/2020 04/03/2020 11/08/2019  WBC 4.0 - 10.5 K/uL 8.7 8.8 6.3  Hemoglobin 12.0 - 15.0 g/dL 12.7 15.4 14.5  Hematocrit 36 - 46 % 35.9(L) 44.8 43.8  Platelets 150 - 400 K/uL 196 275 276    ABG     Component Value Date/Time   PHART 7.316 (L) 04/21/2020 0330   PCO2ART 53.6 (H) 04/21/2020 0330   PO2ART 86.2 04/21/2020 0330   HCO3 26.1 04/21/2020 0330   O2SAT 95.7 04/21/2020 0330    CBG (last 3)  Recent Labs    04/21/20 0000 04/21/20 0311 04/21/20 0813  GLUCAP 101* 109* 192*    Critical care time: 43 minutes  Chesley Mires, MD [place] Pager - [phone] 04/21/2020, 11:33 AM

## 2020-04-21 NOTE — Progress Notes (Signed)
[facility] Progress Note Patient Name: [name] DOB: 11-May-1966 MRN: [id number]   Date of Service  04/21/2020  HPI/Events of Note  ABG on 100%/PRVC 30/TV 470/P 12 = 7.316/53.6/86.2  eICU Interventions  Continue present ventilator management.      Intervention Category Major Interventions: Respiratory failure - evaluation and management  Berkleigh Beckles [place] 04/21/2020, 5:52 AM

## 2020-04-21 NOTE — H&P (Signed)
NAME:  [name], MRN:  [id number], DOB:  06/10/1966, LOS: 1 ADMISSION DATE:  04/20/2020, CONSULTATION DATE:  04/21/20 REFERRING MD:  , CHIEF COMPLAINT: short of breath  Brief History   Ms Benedick is a 54 yo woman with hx of Crohs disease, obesty, asthma, DM, HTN here with COVID PNA.    History of present illness   Transfer to [facility] from [facility].   Intubated there, transferred here.     Past Medical History  Crohn's  Carpal tunnel syndrome s/p surgery B  Arthritis DM "asthma" Obesity  HTN HLD OSA Heart murmur R Total hip replacement  Significant Hospital Events     Consults:    Procedures:  Intubated at OSH  Significant Diagnostic Tests:  CXR B infiltrates  Micro Data:  Blood cx p  Antimicrobials:  remdesivir  Interim history/subjective:    Objective   Blood pressure (!) 130/49, pulse 100, temperature (!) 101 F (38.3 C), temperature source Axillary, resp. rate (!) 24, height 5' 3"  (1.6 m), weight 127.1 kg, SpO2 100 %.    Vent Mode: PRVC FiO2 (%):  [100 %] 100 % Set Rate:  [20 bmp] 20 bmp Vt Set:  [470 mL] 470 mL PEEP:  [12 cmH20] 12 cmH20   Intake/Output Summary (Last 24 hours) at 04/21/2020 0147 Last data filed at 04/21/2020 0000 Gross per 24 hour  Intake --  Output 550 ml  Net -550 ml   Filed Weights   04/21/20 0000  Weight: 127.1 kg    Examination: General: NAD intubated HENT: ncat  Lungs: B crackles, faint Cardiovascular: rrr no mgr Abdomen: obese, nt, nbs Extremities: no edema Neuro: sedated, non responsive    Resolved Hospital Problem list     Assessment & Plan:  Covid 19 PNA:  LTV ventilation, PAo2 goal >55 Recheck abg after vent adjustments (tv 6cc/kg), may require proning.  Cont sedation/analgesia.  Remdesivir, steroids, enoxaparin ppx dosing.  No Baricitinib, is chronically on immunosupression medications for CD.  Procal, inflammatory markers pending.    Crohn's disease HTN - held meds HLD    Best  practice:  Diet: npo Pain/Anxiety/Delirium protocol (if indicated): sedation with fentanyl propofol VAP protocol (if indicated):  DVT prophylaxis: enoxaparin GI prophylaxis: ppi Glucose control: insulin  Mobility: bed Code Status: full Family Communication:  Disposition:   Labs   CBC: No results for input(s): WBC, NEUTROABS, HGB, HCT, MCV, PLT in the last 168 hours.  Basic Metabolic Panel: No results for input(s): NA, K, CL, CO2, GLUCOSE, BUN, CREATININE, CALCIUM, MG, PHOS in the last 168 hours. GFR: Estimated Creatinine Clearance: 105.7 mL/min (by C-G formula based on SCr of 0.74 mg/dL). No results for input(s): PROCALCITON, WBC, LATICACIDVEN in the last 168 hours.  Liver Function Tests: No results for input(s): AST, ALT, ALKPHOS, BILITOT, PROT, ALBUMIN in the last 168 hours. No results for input(s): LIPASE, AMYLASE in the last 168 hours. No results for input(s): AMMONIA in the last 168 hours.  ABG    Component Value Date/Time   PHART 7.352 04/21/2020 0100   PCO2ART 51.2 (H) 04/21/2020 0100   PO2ART 70.3 (L) 04/21/2020 0100   HCO3 26.8 04/21/2020 0100   O2SAT 91.6 04/21/2020 0100     Coagulation Profile: No results for input(s): INR, PROTIME in the last 168 hours.  Cardiac Enzymes: No results for input(s): CKTOTAL, CKMB, CKMBINDEX, TROPONINI in the last 168 hours.  HbA1C: Hgb A1c MFr Bld  Date/Time Value Ref Range Status  11/08/2019 10:50 AM 6.1 (H) 4.8 -  5.6 % Final    Comment:    (NOTE) Pre diabetes:          5.7%-6.4% Diabetes:              >6.4% Glycemic control for   <7.0% adults with diabetes   09/18/2019 01:29 PM 6.3 (H) 4.8 - 5.6 % Final    Comment:    (NOTE) Pre diabetes:          5.7%-6.4% Diabetes:              >6.4% Glycemic control for   <7.0% adults with diabetes     CBG: Recent Labs  Lab 04/21/20 0000  GLUCAP 101*    Review of Systems:   Unable to assess Past Medical History  She,  has a past medical history of Anxiety,  Arthritis, Asthma, Bilateral carpal tunnel syndrome, Crohn disease ([place]), Diabetes mellitus, Heart murmur, High cholesterol, Hypertension, IBS (irritable bowel syndrome), Obesity, Pneumonia (2011), Sleep apnea (03-2009 study), and Trigger thumb, right thumb.   Surgical History    Past Surgical History:  Procedure Laterality Date  . CARPAL TUNNEL RELEASE Right 09/20/2019   Procedure: CARPAL TUNNEL RELEASE;  Surgeon: Hiram Gash, MD;  [facility];  Service: Orthopedics;  Laterality: Right;  . CARPAL TUNNEL RELEASE Left 11/15/2019   Procedure: CARPAL TUNNEL RELEASE, EXCISION OF GANGLION CYST;  Surgeon: Hiram Gash, MD;  [facility];  Service: Orthopedics;  Laterality: Left;  . CHOLECYSTECTOMY N/A 03/31/2013   Procedure: LAPAROSCOPIC CHOLECYSTECTOMY;  Surgeon: Jamesetta So, MD;  Location[facility];  Service: General;  Laterality: N/A;  . COLONOSCOPY N/A 03/16/2013   Procedure: COLONOSCOPY;  Surgeon: Rogene Houston, MD;  [facility];  Service: Endoscopy;  Laterality: N/A;  255  . COLONOSCOPY N/A 12/31/2014   Procedure: COLONOSCOPY;  Surgeon: Rogene Houston, MD;  [facility];  Service: Endoscopy;  Laterality: N/A;  730  . cyst removed from left hand    . FLEXIBLE SIGMOIDOSCOPY N/A 08/21/2014   Procedure: FLEXIBLE SIGMOIDOSCOPY;  Surgeon: Rogene Houston, MD;  [facility];  Service: Endoscopy;  Laterality: N/A;  1030  . TONSILLECTOMY    . TOTAL HIP ARTHROPLASTY  04/18/2012   Procedure: TOTAL HIP ARTHROPLASTY;  Surgeon: Marybelle Killings, MD;  Location: [facility];  Service: Orthopedics;  Laterality: Right;  Right Total Hip Arthroplasty     Social History   reports that she has never smoked. She has never used smokeless tobacco. She reports that she does not drink alcohol and does not use drugs.   Family History   Her family history is not on file.   Allergies Allergies  Allergen Reactions  . Remicade [Infliximab] Swelling    Joints locked up  . Cefprozil  Rash  . Penicillins Rash    Did it involve swelling of the face/tongue/throat, SOB, or low BP? No Did it involve sudden or severe rash/hives, skin peeling, or any reaction on the inside of your mouth or nose? No Did you need to seek medical attention at a hospital or doctor's office? No When did it last happen? If all above answers are "NO", may proceed with cephalosporin use.      Home Medications  Prior to Admission medications   Medication Sig Start Date End Date Taking? Authorizing Provider  albuterol (PROVENTIL,VENTOLIN) 90 MCG/ACT inhaler Inhale 2 puffs into the lungs every 6 (six) hours as needed for wheezing or shortness of breath.  [provider]  Calcium Carb-Cholecalciferol (CALCIUM 600 + D PO) Take 1 tablet by mouth daily.    [provider]  cetirizine (ZYRTEC) 10 MG tablet Take 10 mg by mouth every evening.     [provider]  cholecalciferol (VITAMIN D3) 25 MCG (1000 UNIT) tablet Take 1 tablet (1,000 Units total) by mouth daily. 10/05/19   Minus Liberty, PA-C  dicyclomine (BENTYL) 20 MG tablet TAKE ONE TABLET BY MOUTH THREE TIMES DAILY AS NEEDED FOR SPASMS Patient taking differently: Take 20 mg by mouth 3 (three) times daily as needed for spasms.  03/30/19   Setzer, Rona Ravens, NP  diltiazem (TIAZAC) 360 MG 24 hr capsule Take 360 mg by mouth every morning.  Patient not taking: Reported on 04/03/2020    [provider]  DULoxetine (CYMBALTA) 60 MG capsule Take 60 mg by mouth daily. Patient not taking: Reported on 04/03/2020 10/03/18   [provider]  Fluticasone-Salmeterol (ADVAIR) 250-50 MCG/DOSE AEPB Inhale 1 puff into the lungs every morning.     [provider]  JARDIANCE 25 MG TABS tablet Take 25 mg by mouth daily.  09/14/14   [provider]  lisinopril-hydrochlorothiazide (PRINZIDE,ZESTORETIC) 20-12.5 MG per tablet Take 2 tablets by mouth every morning.     [provider]  loperamide (IMODIUM) 2  MG capsule Take 2 mg by mouth as needed for diarrhea or loose stools.     [provider]  Magnesium 400 MG TABS Take 400 mg by mouth at bedtime.     [provider]  mercaptopurine (PURINETHOL) 50 MG tablet TAKE 1 AND 1/2 TABLETS BY MOUTH DAILY ON EMPTY STOMACH ONE HOUR PRIOR TO MEALS OR TWO HOURS AFTER MEALS. 12/26/19   [name], Mechele Dawley, MD  mesalamine (PENTASA) 500 MG CR capsule TAKE TWO (2) CAPSULES BY MOUTH FOUR TIMES DAILY 11/22/19   [name], Mechele Dawley, MD  Multiple Vitamin (MULITIVITAMIN WITH MINERALS) TABS Take 1 tablet by mouth every morning.     [provider]  Omega-3 Fatty Acids (OMEGA-3 EPA FISH OIL PO) Take by mouth. 1,000 1 cap once a day    [provider]  rosuvastatin (CRESTOR) 5 MG tablet Take 5 mg by mouth once a week.  Patient not taking: Reported on 04/03/2020 05/05/16   [provider]  spironolactone (ALDACTONE) 25 MG tablet Take 25 mg by mouth every morning.     [provider]  traZODone (DESYREL) 50 MG tablet Take 100 mg by mouth at bedtime. 06/26/19   [provider]     Critical care time: 45 min

## 2020-04-21 NOTE — Progress Notes (Signed)
eLink Physician-Brief Progress Note Patient Name: [name] DOB: 1966/06/11 MRN: [id number]   Date of Service  04/21/2020  HPI/Events of Note  Triglyceride level = 1937.   eICU Interventions  Plan: 1. D/C Propofol IV infusion.  2. Fentanyl IV infusion. Titrate to RASS = 0 to -1.      Intervention Category Major Interventions: Other:  [name] 04/21/2020, 5:50 AM

## 2020-04-21 NOTE — Progress Notes (Signed)
ET tube pulled from 26@ the lip to 25@ the lip per CCM order. Patient tolerated well.

## 2020-04-21 NOTE — Progress Notes (Signed)
eLink Physician-Brief Progress Note Patient Name: [name] DOB: 02/24/1966 MRN: [id number]   Date of Service  04/21/2020  HPI/Events of Note  Multiple issues: 1. Asked to review CXR for ETT position. ETT tip 1.5 to 2 cm above the carina and 2. ABG on 100%/PRVC 20/TV 470/P 12 = 7.352/51.2/70.3.   eICU Interventions  Continue present ventilator management.      Intervention Category Major Interventions: Respiratory failure - evaluation and management  Randel Hargens [place] 04/21/2020, 1:42 AM

## 2020-04-21 NOTE — Progress Notes (Signed)
eLink Physician-Brief Progress Note Patient Name: CABRIA MICALIZZI DOB: 1966/04/26 MRN: [id number]   Date of Service  04/21/2020  HPI/Events of Note  55 yo female with PMH of COVID diagnosed on 04/16/2020. Intubated and ventilated for progressive respiratory failure and transferred to [facility] for further management. Nursing question of ETT placement.   eICU Interventions  Plan: 1. Portable CXR STAT. 2. Ventilator settings: 100%/PRVC 20/TV 470/P 12. 3. Propofol IV infusion. Titrate to RASS = 0 to -1.  4. ABG now.      Intervention Category Evaluation Type: New Patient Evaluation  Lysle Dingwall 04/21/2020, 12:25 AM

## 2020-04-22 ENCOUNTER — Inpatient Hospital Stay (HOSPITAL_COMMUNITY): Payer: BC Managed Care – PPO

## 2020-04-22 DIAGNOSIS — J80 Acute respiratory distress syndrome: Secondary | ICD-10-CM

## 2020-04-22 LAB — COMPREHENSIVE METABOLIC PANEL
ALT: 33 U/L (ref 0–44)
AST: 28 U/L (ref 15–41)
Albumin: 2.2 g/dL — ABNORMAL LOW (ref 3.5–5.0)
Alkaline Phosphatase: 91 U/L (ref 38–126)
Anion gap: 8 (ref 5–15)
BUN: 22 mg/dL — ABNORMAL HIGH (ref 6–20)
CO2: 28 mmol/L (ref 22–32)
Calcium: 8.3 mg/dL — ABNORMAL LOW (ref 8.9–10.3)
Chloride: 110 mmol/L (ref 98–111)
Creatinine, Ser: 0.55 mg/dL (ref 0.44–1.00)
GFR calc Af Amer: >60 mL/min (ref >60)
GFR calc non Af Amer: >60 mL/min (ref >60)
Glucose, Bld: 232 mg/dL — ABNORMAL HIGH (ref 70–99)
Potassium: 3.9 mmol/L (ref 3.5–5.1)
Sodium: 146 mmol/L — ABNORMAL HIGH (ref 135–145)
Total Bilirubin: 0.7 mg/dL (ref 0.3–1.2)
Total Protein: 5.8 g/dL — ABNORMAL LOW (ref 6.5–8.1)

## 2020-04-22 LAB — BLOOD GAS, ARTERIAL
Acid-Base Excess: 1.8 mmol/L (ref 0.0–2.0)
Acid-Base Excess: 0 mmol/L (ref 0.0–2.0)
Acid-Base Excess: 3.7 mmol/L — ABNORMAL HIGH (ref 0.0–2.0)
Acid-Base Excess: 6 mmol/L — ABNORMAL HIGH (ref 0.0–2.0)
Bicarbonate: 29 mmol/L — ABNORMAL HIGH (ref 20.0–28.0)
Bicarbonate: 30 mmol/L — ABNORMAL HIGH (ref 20.0–28.0)
Bicarbonate: 32.4 mmol/L — ABNORMAL HIGH (ref 20.0–28.0)
Bicarbonate: 33 mmol/L — ABNORMAL HIGH (ref 20.0–28.0)
Drawn by: 308601
FIO2: 50
FIO2: 90
FIO2: 60
FIO2: 50
MECHVT: 320 mL
MECHVT: 320 mL
MECHVT: 320 mL
O2 Saturation: 93.3 %
O2 Saturation: 96.5 %
O2 Saturation: 90.6 %
O2 Saturation: 92.5 %
PEEP: 8 cmH2O
Patient temperature: 98.6
Patient temperature: 98.6
Patient temperature: 98.6
Patient temperature: 98.1
RATE: 30 resp/min
RATE: 30 {breaths}/min
RATE: 32 {breaths}/min
pCO2 arterial: 54.3 mmHg — ABNORMAL HIGH (ref 32.0–48.0)
pCO2 arterial: 74 mmHg (ref 32.0–48.0)
pCO2 arterial: 71.1 mmHg (ref 32.0–48.0)
pCO2 arterial: 58.4 mmHg — ABNORMAL HIGH (ref 32.0–48.0)
pH, Arterial: 7.347 — ABNORMAL LOW (ref 7.350–7.450)
pH, Arterial: 7.232 — ABNORMAL LOW (ref 7.350–7.450)
pH, Arterial: 7.28 — ABNORMAL LOW (ref 7.350–7.450)
pH, Arterial: 7.368 (ref 7.350–7.450)
pO2, Arterial: 68.4 mmHg — ABNORMAL LOW (ref 83.0–108.0)
pO2, Arterial: 92.8 mmHg (ref 83.0–108.0)
pO2, Arterial: 62.9 mmHg — ABNORMAL LOW (ref 83.0–108.0)
pO2, Arterial: 60.9 mmHg — ABNORMAL LOW (ref 83.0–108.0)

## 2020-04-22 LAB — CBC WITH DIFFERENTIAL/PLATELET
Abs Immature Granulocytes: 0.08 10*3/uL — ABNORMAL HIGH (ref 0.00–0.07)
Basophils Absolute: 0 10*3/uL (ref 0.0–0.1)
Basophils Relative: 0 %
Eosinophils Absolute: 0 10*3/uL (ref 0.0–0.5)
Eosinophils Relative: 0 %
HCT: 38.4 % (ref 36.0–46.0)
Hemoglobin: 12.9 g/dL (ref 12.0–15.0)
Immature Granulocytes: 1 %
Lymphocytes Relative: 5 %
Lymphs Abs: 0.4 10*3/uL — ABNORMAL LOW (ref 0.7–4.0)
MCH: 34 pg (ref 26.0–34.0)
MCHC: 33.6 g/dL (ref 30.0–36.0)
MCV: 101.3 fL — ABNORMAL HIGH (ref 80.0–100.0)
Monocytes Absolute: 0.4 10*3/uL (ref 0.1–1.0)
Monocytes Relative: 5 %
Neutro Abs: 7.7 10*3/uL (ref 1.7–7.7)
Neutrophils Relative %: 89 %
Platelets: 178 10*3/uL (ref 150–400)
RBC: 3.79 MIL/uL — ABNORMAL LOW (ref 3.87–5.11)
RDW: 11.8 % (ref 11.5–15.5)
WBC: 8.6 10*3/uL (ref 4.0–10.5)
nRBC: 0 % (ref 0.0–0.2)

## 2020-04-22 LAB — GLUCOSE, CAPILLARY
Glucose-Capillary: 142 mg/dL — ABNORMAL HIGH (ref 70–99)
Glucose-Capillary: 144 mg/dL — ABNORMAL HIGH (ref 70–99)
Glucose-Capillary: 163 mg/dL — ABNORMAL HIGH (ref 70–99)
Glucose-Capillary: 179 mg/dL — ABNORMAL HIGH (ref 70–99)
Glucose-Capillary: 198 mg/dL — ABNORMAL HIGH (ref 70–99)
Glucose-Capillary: 204 mg/dL — ABNORMAL HIGH (ref 70–99)
Glucose-Capillary: 224 mg/dL — ABNORMAL HIGH (ref 70–99)

## 2020-04-22 LAB — PHOSPHORUS: Phosphorus: 2 mg/dL — ABNORMAL LOW (ref 2.5–4.6)

## 2020-04-22 LAB — C-REACTIVE PROTEIN: CRP: 21.9 mg/dL — ABNORMAL HIGH (ref <1.0)

## 2020-04-22 LAB — MAGNESIUM: Magnesium: 2.5 mg/dL — ABNORMAL HIGH (ref 1.7–2.4)

## 2020-04-22 LAB — TRIGLYCERIDES: Triglycerides: 66 mg/dL (ref <150)

## 2020-04-22 LAB — D-DIMER, QUANTITATIVE: D-Dimer, Quant: 5.35 ug/mL-FEU — ABNORMAL HIGH (ref 0.00–0.50)

## 2020-04-22 MED ORDER — MIDAZOLAM BOLUS VIA INFUSION
1.0000 mg | INTRAVENOUS | Status: DC | PRN
  Administered 2020-04-23 – 2020-04-25 (×4): 2 mg via INTRAVENOUS
  Administered 2020-04-25: 1 mg via INTRAVENOUS
  Administered 2020-04-30 – 2020-05-03 (×2): 2 mg via INTRAVENOUS
  Filled 2020-04-22: qty 2

## 2020-04-22 MED ORDER — HYDRALAZINE HCL 20 MG/ML IJ SOLN
10.0000 mg | INTRAMUSCULAR | Status: DC | PRN
  Administered 2020-04-23: 10 mg via INTRAVENOUS
  Filled 2020-04-22: qty 1

## 2020-04-22 MED ORDER — PANTOPRAZOLE SODIUM 40 MG PO PACK
40.0000 mg | PACK | Freq: Every day | ORAL | Status: DC
  Administered 2020-04-22 – 2020-05-04 (×13): 40 mg
  Filled 2020-04-22 (×13): qty 20

## 2020-04-22 MED ORDER — SODIUM CHLORIDE 0.9 % IV SOLN
0.0000 ug/kg/min | INTRAVENOUS | Status: DC
  Administered 2020-04-22 – 2020-04-24 (×7): 3 ug/kg/min via INTRAVENOUS
  Administered 2020-04-25: 3.005 ug/kg/min via INTRAVENOUS
  Filled 2020-04-22 (×11): qty 20

## 2020-04-22 MED ORDER — CISATRACURIUM BOLUS VIA INFUSION
0.1000 mg/kg | Freq: Once | INTRAVENOUS | Status: DC
  Filled 2020-04-22: qty 13

## 2020-04-22 MED ORDER — FENTANYL BOLUS VIA INFUSION
50.0000 ug | INTRAVENOUS | Status: DC | PRN
  Administered 2020-04-23 – 2020-05-03 (×13): 50 ug via INTRAVENOUS
  Filled 2020-04-22: qty 50

## 2020-04-22 MED ORDER — ARTIFICIAL TEARS OPHTHALMIC OINT
1.0000 "application " | TOPICAL_OINTMENT | Freq: Three times a day (TID) | OPHTHALMIC | Status: DC
  Administered 2020-04-22 – 2020-04-26 (×12): 1 via OPHTHALMIC
  Filled 2020-04-22: qty 3.5

## 2020-04-22 MED ORDER — VITAL AF 1.2 CAL PO LIQD
1000.0000 mL | ORAL | Status: DC
  Administered 2020-04-22: 1000 mL

## 2020-04-22 MED ORDER — ENOXAPARIN SODIUM 60 MG/0.6ML ~~LOC~~ SOLN
60.0000 mg | SUBCUTANEOUS | Status: DC
  Administered 2020-04-22 – 2020-05-16 (×25): 60 mg via SUBCUTANEOUS
  Filled 2020-04-22 (×24): qty 0.6

## 2020-04-22 MED ORDER — FENTANYL 2500MCG IN NS 250ML (10MCG/ML) PREMIX INFUSION
50.0000 ug/h | INTRAVENOUS | Status: DC
  Administered 2020-04-22: 150 ug/h via INTRAVENOUS
  Administered 2020-04-23: 125 ug/h via INTRAVENOUS
  Administered 2020-04-23 – 2020-04-24 (×2): 200 ug/h via INTRAVENOUS
  Administered 2020-04-25: 300 ug/h via INTRAVENOUS
  Administered 2020-04-25: 200 ug/h via INTRAVENOUS
  Administered 2020-04-25: 250 ug/h via INTRAVENOUS
  Administered 2020-04-26: 275 ug/h via INTRAVENOUS
  Administered 2020-04-26: 300 ug/h via INTRAVENOUS
  Administered 2020-04-27: 265 ug/h via INTRAVENOUS
  Administered 2020-04-27: 275 ug/h via INTRAVENOUS
  Administered 2020-04-27: 250 ug/h via INTRAVENOUS
  Administered 2020-04-28 – 2020-04-29 (×4): 265 ug/h via INTRAVENOUS
  Administered 2020-04-30 (×2): 250 ug/h via INTRAVENOUS
  Administered 2020-04-30: 265 ug/h via INTRAVENOUS
  Administered 2020-05-01 – 2020-05-02 (×2): 200 ug/h via INTRAVENOUS
  Administered 2020-05-02: 50 ug/h via INTRAVENOUS
  Administered 2020-05-03: 200 ug/h via INTRAVENOUS
  Filled 2020-04-22 (×24): qty 250

## 2020-04-22 MED ORDER — FREE WATER
100.0000 mL | Status: DC
  Administered 2020-04-22 – 2020-04-27 (×26): 100 mL

## 2020-04-22 MED ORDER — FENTANYL CITRATE (PF) 100 MCG/2ML IJ SOLN
50.0000 ug | Freq: Once | INTRAMUSCULAR | Status: DC
  Filled 2020-04-22: qty 2

## 2020-04-22 MED ORDER — VECURONIUM BROMIDE 10 MG IV SOLR
INTRAVENOUS | Status: AC
  Administered 2020-04-22: 10 mg
  Filled 2020-04-22: qty 10

## 2020-04-22 MED ORDER — MIDAZOLAM 50MG/50ML (1MG/ML) PREMIX INFUSION
0.5000 mg/h | INTRAVENOUS | Status: DC
  Administered 2020-04-22: 0.5 mg/h via INTRAVENOUS
  Filled 2020-04-22: qty 50

## 2020-04-22 MED ORDER — METOCLOPRAMIDE HCL 5 MG/ML IJ SOLN
5.0000 mg | Freq: Three times a day (TID) | INTRAMUSCULAR | Status: AC
  Administered 2020-04-22 (×2): 5 mg via INTRAVENOUS
  Filled 2020-04-22 (×2): qty 2

## 2020-04-22 MED ORDER — POTASSIUM PHOSPHATES 15 MMOLE/5ML IV SOLN
10.0000 mmol | Freq: Once | INTRAVENOUS | Status: AC
  Administered 2020-04-22: 10 mmol via INTRAVENOUS
  Filled 2020-04-22: qty 3.33

## 2020-04-22 MED ORDER — MIDAZOLAM 50MG/50ML (1MG/ML) PREMIX INFUSION
2.0000 mg/h | INTRAVENOUS | Status: DC
  Administered 2020-04-22 – 2020-04-24 (×3): 2 mg/h via INTRAVENOUS
  Administered 2020-04-25 – 2020-04-29 (×8): 4 mg/h via INTRAVENOUS
  Administered 2020-04-29: 5 mg/h via INTRAVENOUS
  Administered 2020-04-30: 6 mg/h via INTRAVENOUS
  Administered 2020-04-30 – 2020-05-01 (×2): 3 mg/h via INTRAVENOUS
  Administered 2020-05-02 (×2): 4 mg/h via INTRAVENOUS
  Administered 2020-05-03: 5 mg/h via INTRAVENOUS
  Filled 2020-04-22 (×19): qty 50

## 2020-04-22 MED ORDER — MIDAZOLAM HCL 2 MG/2ML IJ SOLN: 1.0000 mg | INTRAMUSCULAR | Status: DC | PRN

## 2020-04-22 NOTE — Progress Notes (Signed)
  Updated ABG   On fio2 60% and peep 10  Recent Labs  Lab 04/21/20 0100 04/21/20 0330 04/22/20 0230 04/22/20 1118 04/22/20 1700  PHART 7.352 7.316* 7.347* 7.232* 7.280*  PCO2ART 51.2* 53.6* 54.3* 74.0* 71.1*  PO2ART 70.3* 86.2 68.4* 92.8 62.9*  HCO3 26.8 26.1 29.0* 30.0* 32.4*  O2SAT 91.6 95.7 93.3 96.5 90.6    PF ratio: 105   Plan  - prone ventilation for ARDS - 16h prone/supine 8h -  Hold TF -> prone -> when supine -> get post pyloric panda -> then TF - start prone protocol    Sister in law Eads RT [phone] updated    SIGNATURE    Dr. Brand Males, M.D., F.C.C.P,  Pulmonary and Critical Care Medicine Staff Physician, [place] Director - Interstitial Lung Disease  Program  Pulmonary [place] at [place], [place], [address]  Pager: [phone], If no answer  OR between  19:00-7:00h: page [phone]  [phone] Telephone (clinical office): [phone] Telephone (research): [phone]  5:59 PM 04/22/2020

## 2020-04-22 NOTE — Progress Notes (Signed)
RT attempted to place [name] per MD order; placement unsuccessful. PCCM NP made aware. ABG to be drawn once patient has been on Nimbex for 30-60 min.

## 2020-04-22 NOTE — Progress Notes (Signed)
Pharmacy Consult Note - Remdesivir  Reviewed medication administration record from [facility].  Confirmed that patient received remdesivir 260m IV on 8/17, then 1049mIV q24h 8/18-8/21.    ErPeggyann JubaPharmD, BCPS Pharmacy: [phone]/23/2021 9:10 AM

## 2020-04-22 NOTE — Progress Notes (Signed)
   Urine with sediments Hypoxemia improved - 60% after increase in peep Sync with vent on sedation and nimbex  Plan  - check urine culture   - continue full ICU care    SIGNATURE    Dr. Brand Males, M.D., F.C.C.P,  Pulmonary and Critical Care Medicine Staff Physician, [place] Director - Interstitial Lung Disease  Program  Pulmonary [place] at [place], [place], [address]  Pager: [phone], If no answer  OR between  19:00-7:00h: page [phone] Telephone (clinical office): [phone] Telephone (research): [phone]  5:15 PM 04/22/2020

## 2020-04-22 NOTE — Progress Notes (Signed)
Initial Nutrition Assessment  DOCUMENTATION CODES:   Morbid obesity  INTERVENTION:  - will order TF to re-start later today: Vital AF 1.2 @ 30 ml/hr to advance by 10 ml every 8 hours to reach goal rate of 60 ml/hr with 100 ml free water every 4 hours. - at goal rate, this regimen will provide 1728 kcal (104% estimated kcal need), 108 grams protein, and 1768 ml free water.   NUTRITION DIAGNOSIS:   Increased nutrient needs related to acute illness, catabolic illness (UQJFH-54 infection) as evidenced by estimated needs.  GOAL:   Provide needs based on ASPEN/SCCM guidelines  MONITOR:   Vent status, TF tolerance, Labs, Weight trends, I & O's  REASON FOR ASSESSMENT:   Ventilator, Consult Enteral/tube feeding initiation and management  ASSESSMENT:   54 year-old female with medical history of Crohn's disease/IBS, obesty, asthma, arthritis, sleep apnea, HLD, DM, and HTN. She was transferred to [facility] from [facility] d/t COVID-19 PNA. She is not COVID vaccinated.  Patient was intubated at [facility], prior to transfer to [facility]. Patient has NGT in R nare. She was receiving TF per protocol: Vital High Protein @ 40 ml/hr with 45 ml Prosource TF BID which provides 1040 kcal, 106 grams protein, and 802 ml free water.   Patient was discussed in rounds this AM. She vomited TF-appearing contents earlier this AM. Able to talk with Dr. Chase Caller about this. Plan to re-start TF later today and if it becomes warranted, may look to add a prokinetic.   Weight today is 282 lb, weight yesterday and 8/4 was 280 lb, and weight on 12/15/19 was 278 lb.   Per notes: - acute respiratory d/t ARDS 2/2 COVID-19 PNA - acute metabolic encephalopathy  - poorly controlled type 2 DM - hx of Crohn's   Patient is currently intubated on ventilator support MV: 9.2 L/min Temp (24hrs), Avg:99.3 F (37.4 C), Min:98.4 F (36.9 C), Max:101.1 F (38.4 C) Propofol: none   Labs reviewed; CBGs: 224, 198,  204 mg/dl, Na: 146 mmol/l, BUN: 22 mg/dl, Ca: 8.3 mg/dl, Phos: 2 mg/dl, Mg: 2.5 mg/dl. Medications reviewed; 1000 units vitamin D3/day, sliding scale novolog, 19 units levemir BID, 5 mg tradjenta/day, 63.75 mg solu=medrol BID, 1 tablet multivitamin with minerals, 40 mg protonix per NGT/day, 10 mmol IV KPhos x1 run 8/23. Drips; nimbex @ 3 mcg/kg/min, fentanyl @ 200 mcg/hr, versed @ 2 mg/hr.     NUTRITION - FOCUSED PHYSICAL EXAM:  not completed at this time.   Diet Order:   Diet Order            Diet NPO time specified  Diet effective now                 EDUCATION NEEDS:   No education needs have been identified at this time  Skin:  Skin Assessment: Reviewed RN Assessment  Last BM:  8/20  Height:   Ht Readings from Last 1 Encounters:  04/21/20 5' 3"  (1.6 m)    Weight:   Wt Readings from Last 1 Encounters:  04/22/20 128.1 kg    Estimated Nutritional Needs:  Kcal:  1660 kcal Protein:  >/= 104 grams Fluid:  >/= 1.8 L/day     Jarome Matin, MS, RD, LDN, CNSC Inpatient Clinical Dietitian RD pager # available in AMION  After hours/weekend pager # available in [facility]

## 2020-04-22 NOTE — Progress Notes (Signed)
Inpatient Diabetes Program Recommendations  AACE/ADA: New Consensus Statement on Inpatient Glycemic Control (2015)  Target Ranges:  Prepandial:   less than 140 mg/dL      Peak postprandial:   less than 180 mg/dL (1-2 hours)      Critically ill patients:  140 - 180 mg/dL   Lab Results  Component Value Date   GLUCAP 204 (H) 04/22/2020   HGBA1C 6.1 (H) 11/08/2019    Review of Glycemic Control Results for RANISHA, ALLAIRE (MRN [id number]) as of 04/22/2020 11:09  Ref. Range 04/21/2020 16:20 04/21/2020 19:32 04/22/2020 00:28 04/22/2020 04:14 04/22/2020 08:16  Glucose-Capillary Latest Ref Range: 70 - 99 mg/dL 231 (H) 213 (H) 224 (H) 198 (H) 204 (H)   Diabetes history: DM 2 Outpatient Diabetes medications: Jardiance 25 mg Daily, unsure of insulin doses prior to admission Current orders for Inpatient glycemic control:  Levemir 19 units bid Novolog 0-20 units Q4 hours Tradjenta 5 mg Daily  Solumedrol 63.75 mg Q12 hours Vital HP 40 ml/hour  Inpatient Diabetes Program Recommendations:    - add Novolog 4 units Q4 hours Tube Feed Coverage.  Thanks,  Tama Headings RN, MSN, BC-ADM Inpatient Diabetes Coordinator Team Pager [phone] (8a-5p)

## 2020-04-22 NOTE — Progress Notes (Signed)
Patient ETT taped 25 @ the lip woth cloth tape. Patient then proned w/o complication and FiO2 reduced to 50% per O2 sat of 95%. ABG for 30 min-1 hour post prone.

## 2020-04-22 NOTE — Progress Notes (Signed)
Vent set rate changed per PCCM order; ABG appox 1630.

## 2020-04-22 NOTE — Progress Notes (Signed)
CRITICAL VALUE ALERT  Critical Value:  pco2 74  Date & Time Notied:  04/22/20 1210  Provider Notified: Dr. Chase Caller  Orders Received/Actions taken: no new orders

## 2020-04-22 NOTE — Progress Notes (Signed)
NAME:  [name], MRN:  [id number], DOB:  06/06/1966, LOS: 2 ADMISSION DATE:  04/20/2020, CONSULTATION DATE:  04/21/20 REFERRING MD:  , CHIEF COMPLAINT: short of breath  Brief History   54 yo female tested positive for COVID 19 on 8/09.  Had progressive symptoms and admitted to [facility] on 8/17.  Treated with remdesivir, prednisone and ABx for CAP.  She developed worsening hypoxia and required intubation.  She was transferred from [facility] with VDRF from [place] 19 pneumonia with ARDS.  Past Medical History  Crohn's colitis, Carpal tunnel, Osteoarthritis, DM type 2, Asthma, HTN, HLD, OSA, Morbid obesity with BMI 49.64, Depression  Significant Hospital Events   8/22 transfer from [place] on full vent support, sedation.  Consults:    Procedures:  ETT 8/21 >>  RUE PICC ? date  Significant Diagnostic Tests:    Micro Data:  COVID 8/09 >> positive COVID 8/17 >> positive MRSA PCR 8/22 >> Blood 8/22 >> Sputum 8/22 >>  Antimicrobials:  Remdesivir 8/17 >> Steroids 8/17 >>  No Immune modulator eg Acterma (hx of crohns)  Interim history/subjective:   04/22/2020 - on vfent gtt, versed gtt, 40% fio2, peep 8 -> significant vent dysnchrony +. MAkes urine. Not on pressors   Objective   Blood pressure (!) 142/65, pulse 78, temperature 99.2 F (37.3 C), temperature source Oral, resp. rate 19, height 5' 3"  (1.6 m), weight 128.1 kg, SpO2 91 %.    Vent Mode: PRVC FiO2 (%):  [50 %-80 %] 50 % Set Rate:  [30 bmp] 30 bmp Vt Set:  [320 mL] 320 mL PEEP:  [8 cmH20-10 cmH20] 8 cmH20 Plateau Pressure:  [16 cmH20-17 cmH20] 16 cmH20   Intake/Output Summary (Last 24 hours) at 04/22/2020 0835 Last data filed at 04/22/2020 0640 Gross per 24 hour  Intake 757.73 ml  Output 1350 ml  Net -592.27 ml   Filed Weights   04/21/20 0000 04/21/20 0500 04/22/20 0424  Weight: 127.1 kg 127.1 kg 128.1 kg    Examination:  General Appearance:  Looks criticall ill OBESE -  + Head:  Normocephalic, without obvious abnormality, atraumatic Eyes:  PERRL - yes, conjunctiva/corneas - muddy     Ears:  Normal external ear canals, both ears Nose:  G tube - no Throat:  ETT TUBE - yes , OG tube - yes Neck:  Supple,  No enlargement/tenderness/nodules Lungs: Clear to auscultation bilaterally, Ventilator   Synchrony - NO. VERY DYSNCHRONOUS Heart:  S1 and S2 normal, no murmur, CVP - no.  Pressors - no Abdomen:  Soft, no masses, no organomegaly Genitalia / Rectal:  Not done Extremities:  Extremities- intact Skin:  ntact in exposed areas . Sacral area - not examined Neurologic:  Sedation - fent gtt, versed gtt -> RASS - -4 . Moves all 4s - no. CAM-ICU - cannot test . Orientation - cannot test      Resolved Hospital Problem list   High TGL - holding diprivan  Assessment & Plan:   Acute hypoxic/hypercapnic respiratory failure with ARDS from COVID 19 pneumonia. Hx of asthma, obstructive sleep apnea.    04/22/2020 - > does not meet criteria for SBT/Extubation in setting of Acute Respiratory Failure due to ARDS, severe vent dysbnchrony  Plan - start nimbex gtt for vent syncrhony - goal SpO2 88 to 95% - goal Vt 6 cc/kg - f/u CXR, ABG - - pulmicort, brovana, prn albutero  COVID-19 criticall ill infection, unvaccinated, immune suppressed baseline  plan - day 7 of  solumedrol -  remedesivir per protocl - not candidate for tocilizumab or baricitinab since she has been on immunosuppressive therapy for Crohn's colitis  Acute metabolic encephalopathy 2nd to hypoxia, hypercapnia. Hx of depression.  04/22/2020 - RASS -4 and vent dysnchrony +  Plan - RASS goal -4/-5  - start versed gtt and continue fent gtt  - add nimbex -> BIS Score < 60 - hold outpt  trazodone  Hx of Crohn's colitis.  04/22/2020 - tolrateing TF  plan - hold outpt mercaptopurine, mesalamine - if she develops significant GI symptoms, them might need GI assessment >> followed by GI in  [place]  Hx of HTN. - hold outpt  lisinopril, HCTZ, aldactone  DM type 2 poorly controlled with steroid induced hyperglycemia. - SSI with levemir and tradjenta  Electrolyte imbalance  04/22/2020 - low phos  Plan  - replete phos  Best practice:  Diet: tube feeds DVT prophylaxis: enoxaparin GI prophylaxis: protonix  Mobility: bed Code Status: full  Disposition: ICU  Family: Husband [name] [phone] - updated in details   [place]   [name] is critically ill with multiple organ systems failure and requires high complexity decision making for assessment and support, frequent evaluation and titration of therapies, application of advanced monitoring technologies and extensive interpretation of multiple databases.   Critical Care Time devoted to patient care services described in this note is  40  Minutes. This time reflects time of care of this signee Dr Brand Males. This critical care time does not reflect procedure time, or teaching time or supervisory time of PA/NP/Med student/Med Resident etc but could involve care discussion time     Dr. Brand Males, M.D., [facility].C.P Pulmonary and Critical Care Medicine Staff Physician [place] [facility] Pager: [phone], If no answer or between  15:00h - 7:00h: call [phone]  04/22/2020 8:36 AM   LABS    PULMONARY Recent Labs  Lab 04/21/20 0100 04/21/20 0330 04/22/20 0230  PHART 7.352 7.316* 7.347*  PCO2ART 51.2* 53.6* 54.3*  PO2ART 70.3* 86.2 68.4*  HCO3 26.8 26.1 29.0*  O2SAT 91.6 95.7 93.3    CBC Recent Labs  Lab 04/21/20 0243 04/22/20 0227  HGB 12.7 12.9  HCT 35.9* 38.4  WBC 8.7 8.6  PLT 196 178    COAGULATION No results for input(s): INR in the last 168 hours.  CARDIAC  No results for input(s): TROPONINI in the last 168 hours. No results for input(s): PROBNP in the last 168 hours.   CHEMISTRY Recent  Labs  Lab 04/21/20 0243 04/21/20 0533 04/21/20 1134 04/21/20 1630 04/22/20 0227  NA  --  136  --   --  146*  K  --  3.3*  --   --  3.9  CL  --  103  --   --  110  CO2  --  25  --   --  28  GLUCOSE  --  187*  --   --  232*  BUN  --  14  --   --  22*  CREATININE 0.67 0.56  --   --  0.55  CALCIUM  --  7.4*  --   --  8.3*  MG  --   --  2.1 2.3 2.5*  PHOS  --   --  2.6 2.4* 2.0*   Estimated Creatinine Clearance: 106.2 mL/min (by C-G formula based on SCr of 0.55 mg/dL).  LIVER Recent Labs  Lab 04/21/20 0533 04/22/20 0227  AST 37 28  ALT 36 33  ALKPHOS 95 91  BILITOT 0.9 0.7  PROT 5.1* 5.8*  ALBUMIN 2.1* 2.2*     INFECTIOUS No results for input(s): LATICACIDVEN, PROCALCITON in the last 168 hours.   ENDOCRINE CBG (last 3)  Recent Labs    04/22/20 0028 04/22/20 0414 04/22/20 0816  GLUCAP 224* 198* 204*         IMAGING x48h  - image(s) personally visualized  -   highlighted in bold DG Abd 1 View  Result Date: 04/21/2020 CLINICAL DATA:  NG tube placement EXAM: ABDOMEN - 1 VIEW COMPARISON:  None. FINDINGS: NG tube tip and side port project within the stomach. Small amount of bowel gas generally. IMPRESSION: NG tube tip and side port in the stomach. Electronically Signed   By: Ulyses Jarred M.D.   On: 04/21/2020 00:39   DG Chest Port 1 View  Result Date: 04/22/2020 CLINICAL DATA:  Respiratory failure. ARDS secondary to COVID 19 virus. EXAM: PORTABLE CHEST 1 VIEW COMPARISON:  One-view chest x-ray 04/21/2020 FINDINGS: Heart size is exaggerated by low volumes. Diffuse interstitial and airspace opacities are similar the prior exams. Tracheal tube enteric tube are stable. IMPRESSION: Stable appearance of diffuse interstitial and airspace disease consistent with ARDS. Electronically Signed   By: San Morelle M.D.   On: 04/22/2020 06:30   DG CHEST PORT 1 VIEW  Result Date: 04/21/2020 CLINICAL DATA:  Endotracheal tube placement EXAM: PORTABLE CHEST 1 VIEW  COMPARISON:  04/20/2020 FINDINGS: Endotracheal tube tip is at the level of the clavicular heads. Esophageal tube courses beyond the field of view. Bilateral upper lobe predominant airspace opacities are unchanged. IMPRESSION: 1. Endotracheal tube tip at the level of the clavicular heads. 2. Unchanged bilateral upper lobe predominant airspace opacities. Electronically Signed   By: Ulyses Jarred M.D.   On: 04/21/2020 00:38

## 2020-04-22 NOTE — Progress Notes (Signed)
Discussed patient status with RN.  She indicates vomiting early in the day, TF held and NGT to suction.  Concern for TF coming out of ETT post event with possible aspiration. KUB reviewed from 8/22, will give two doses of reglan for nausea / motility.  Re-trial trickle feeding.  ABG reviewed, rate increased to 32.  Follow up  ABG at 1630.     Noe Gens, MSN, NP-C [place] [facility] 04/22/2020, 3:41 PM   Please see [email or web address] for pager details.

## 2020-04-22 NOTE — Progress Notes (Addendum)
eLink Physician-Brief Progress Note Patient Name: [name] DOB: 12/20/1965 MRN: [id number]   Date of Service  04/22/2020  HPI/Events of Note  Hypertension in 170s, has history of such Very comfortable , prone, synchronous with vent  eICU Interventions  PRN hydralazine      Intervention Category Major Interventions: Hypertension - evaluation and management  Margaretmary Lombard 04/22/2020, 11:45 PM   12:55 am Notified HR in 130s SBP down to 148 Was just given hydralazine around 12,15 am  Sinus tachycardia, no fever and likely tthis was hydralazine related Fully sedated and comfortable on camera Will change to metoprolol for BP, no urgent treatment for this rate is needed for now D/W RN

## 2020-04-22 NOTE — TOC Initial Note (Signed)
Transition of Care [facility]) - Initial/Assessment Note    Patient Details  Name: [name] MRN: [id number] Date of Birth: 05/19/1966  Transition of Care [facility]) CM/SW Contact:    Leeroy Cha, RN Phone Number: 04/22/2020, 8:22 AM  Clinical Narrative:                 Transfer from [facility] due to +covid and resp failure, intubated and transferred to [facility], vent at 50%, iv solu medrol, iv sedation, remdesivir through 082721/hx of immunosuppressed and on 6-mp due to crohn's dz. Following for progression and toc needs. Expected Discharge Plan: [place] (from [place]) Barriers to Discharge: Continued Medical Work up   Patient Goals and CMS Choice Patient states their goals for this hospitalization and ongoing recovery are:: unable to state due to vent      Expected Discharge Plan and Services Expected Discharge Plan: [place] (from [place])   Discharge Planning Services: CM Consult   Living arrangements for the past 2 months: [place]                                      Prior Living Arrangements/Services Living arrangements for the past 2 months: [place] Lives with:: Facility Resident Patient language and need for interpreter reviewed:: Yes        Need for Family Participation in Patient Care: Yes (Comment) Care giver support system in place?: Yes (comment)   Criminal Activity/Legal Involvement Pertinent to Current Situation/Hospitalization: No - Comment as needed  Activities of Daily Living Home Assistive Devices/Equipment: None ADL Screening (condition at time of admission) Patient's cognitive ability adequate to safely complete daily activities?: No Is the patient deaf or have difficulty hearing?: No Does the patient have difficulty seeing, even when wearing glasses/contacts?: No Does the patient have difficulty concentrating, remembering, or making decisions?: No Patient  able to express need for assistance with ADLs?: No Does the patient have difficulty dressing or bathing?: No Independently performs ADLs?: No Does the patient have difficulty walking or climbing stairs?: Yes Weakness of Legs: Both Weakness of Arms/Hands: None  Permission Sought/Granted                  Emotional Assessment Appearance:: Appears stated age Attitude/Demeanor/Rapport: Intubated (Following Commands or Not Following Commands) Affect (typically observed): Unable to Assess Orientation: : Fluctuating Orientation (Suspected and/or reported Sundowners) Alcohol / Substance Use: Never Used Psych Involvement: No (comment)  Admission diagnosis:  Acute respiratory distress syndrome (ARDS) due to COVID-19 virus ([place]) [U07.1, J80] Patient Active Problem List   Diagnosis Date Noted  . Acute respiratory distress syndrome (ARDS) due to COVID-19 virus ([place]) 04/21/2020  . History of total hip arthroplasty, right 10/12/2019  . Trochanteric bursitis, right hip 10/12/2019  . Trigger thumb, right thumb 04/21/2018  . IBS (irritable bowel syndrome) 06/18/2015  . Obesity 10/08/2014  . Osteoarthritis of right hip 04/20/2012  . Hypertension 10/08/2011  . High cholesterol 10/08/2011  . Diabetes in pregnancy 10/08/2011  . Crohn disease ([place]) 10/08/2011   PCP:  Caryl Bis, MD Pharmacy:   [place], [place] [place] [address] Phone: [phone] Fax: [phone]     Social Determinants of Health (SDOH) Interventions    Readmission Risk Interventions No flowsheet data found.

## 2020-04-23 ENCOUNTER — Inpatient Hospital Stay (HOSPITAL_COMMUNITY): Payer: BC Managed Care – PPO

## 2020-04-23 LAB — CBC
HCT: 41.3 % (ref 36.0–46.0)
Hemoglobin: 13.4 g/dL (ref 12.0–15.0)
MCH: 33.2 pg (ref 26.0–34.0)
MCHC: 32.4 g/dL (ref 30.0–36.0)
MCV: 102.2 fL — ABNORMAL HIGH (ref 80.0–100.0)
Platelets: 234 10*3/uL (ref 150–400)
RBC: 4.04 MIL/uL (ref 3.87–5.11)
RDW: 11.7 % (ref 11.5–15.5)
WBC: 14.8 10*3/uL — ABNORMAL HIGH (ref 4.0–10.5)
nRBC: 0 % (ref 0.0–0.2)

## 2020-04-23 LAB — COMPREHENSIVE METABOLIC PANEL
ALT: 31 U/L (ref 0–44)
AST: 17 U/L (ref 15–41)
Albumin: 2.3 g/dL — ABNORMAL LOW (ref 3.5–5.0)
Alkaline Phosphatase: 80 U/L (ref 38–126)
Anion gap: 8 (ref 5–15)
BUN: 26 mg/dL — ABNORMAL HIGH (ref 6–20)
CO2: 30 mmol/L (ref 22–32)
Calcium: 8.1 mg/dL — ABNORMAL LOW (ref 8.9–10.3)
Chloride: 105 mmol/L (ref 98–111)
Creatinine, Ser: 0.38 mg/dL — ABNORMAL LOW (ref 0.44–1.00)
GFR calc Af Amer: >60 mL/min (ref >60)
GFR calc non Af Amer: >60 mL/min (ref >60)
Glucose, Bld: 174 mg/dL — ABNORMAL HIGH (ref 70–99)
Potassium: 3.9 mmol/L (ref 3.5–5.1)
Sodium: 143 mmol/L (ref 135–145)
Total Bilirubin: 0.7 mg/dL (ref 0.3–1.2)
Total Protein: 5.8 g/dL — ABNORMAL LOW (ref 6.5–8.1)

## 2020-04-23 LAB — BLOOD GAS, ARTERIAL
Acid-Base Excess: 6.9 mmol/L — ABNORMAL HIGH (ref 0.0–2.0)
Acid-Base Excess: 8.6 mmol/L — ABNORMAL HIGH (ref 0.0–2.0)
Bicarbonate: 32.7 mmol/L — ABNORMAL HIGH (ref 20.0–28.0)
Bicarbonate: 34.3 mmol/L — ABNORMAL HIGH (ref 20.0–28.0)
FIO2: 50
O2 Saturation: 94 %
O2 Saturation: 89.1 %
Patient temperature: 99.1
Patient temperature: 98.6
pCO2 arterial: 53.6 mmHg — ABNORMAL HIGH (ref 32.0–48.0)
pCO2 arterial: 52.2 mmHg — ABNORMAL HIGH (ref 32.0–48.0)
pH, Arterial: 7.403 (ref 7.350–7.450)
pH, Arterial: 7.433 (ref 7.350–7.450)
pO2, Arterial: 69.6 mmHg — ABNORMAL LOW (ref 83.0–108.0)
pO2, Arterial: 53.6 mmHg — ABNORMAL LOW (ref 83.0–108.0)

## 2020-04-23 LAB — GLUCOSE, CAPILLARY
Glucose-Capillary: 152 mg/dL — ABNORMAL HIGH (ref 70–99)
Glucose-Capillary: 163 mg/dL — ABNORMAL HIGH (ref 70–99)
Glucose-Capillary: 167 mg/dL — ABNORMAL HIGH (ref 70–99)
Glucose-Capillary: 176 mg/dL — ABNORMAL HIGH (ref 70–99)
Glucose-Capillary: 193 mg/dL — ABNORMAL HIGH (ref 70–99)
Glucose-Capillary: 197 mg/dL — ABNORMAL HIGH (ref 70–99)

## 2020-04-23 LAB — PHOSPHORUS: Phosphorus: 2.1 mg/dL — ABNORMAL LOW (ref 2.5–4.6)

## 2020-04-23 LAB — PROTIME-INR
INR: 1.2 (ref 0.8–1.2)
Prothrombin Time: 14.7 seconds (ref 11.4–15.2)

## 2020-04-23 LAB — MAGNESIUM: Magnesium: 2.5 mg/dL — ABNORMAL HIGH (ref 1.7–2.4)

## 2020-04-23 MED ORDER — VITAL AF 1.2 CAL PO LIQD
1000.0000 mL | ORAL | Status: DC
  Administered 2020-04-23: 1000 mL

## 2020-04-23 MED ORDER — POTASSIUM PHOSPHATES 15 MMOLE/5ML IV SOLN
10.0000 mmol | Freq: Once | INTRAVENOUS | Status: AC
  Administered 2020-04-23: 10 mmol via INTRAVENOUS
  Filled 2020-04-23: qty 3.33

## 2020-04-23 MED ORDER — FUROSEMIDE 10 MG/ML IJ SOLN
40.0000 mg | Freq: Once | INTRAMUSCULAR | Status: AC
  Administered 2020-04-23: 40 mg via INTRAVENOUS
  Filled 2020-04-23: qty 4

## 2020-04-23 MED ORDER — PROSOURCE TF PO LIQD
90.0000 mL | Freq: Four times a day (QID) | ORAL | Status: DC
  Administered 2020-04-23 – 2020-04-24 (×4): 90 mL
  Filled 2020-04-23 (×4): qty 90

## 2020-04-23 MED ORDER — METOPROLOL TARTRATE 5 MG/5ML IV SOLN
5.0000 mg | Freq: Four times a day (QID) | INTRAVENOUS | Status: DC | PRN
  Administered 2020-04-23 – 2020-05-05 (×8): 5 mg via INTRAVENOUS
  Filled 2020-04-23 (×8): qty 5

## 2020-04-23 MED ORDER — HYDROCHLOROTHIAZIDE 10 MG/ML ORAL SUSPENSION
12.5000 mg | Freq: Every day | ORAL | Status: DC
  Filled 2020-04-23: qty 1.88

## 2020-04-23 MED ORDER — LISINOPRIL 10 MG PO TABS: 20.0000 mg | ORAL_TABLET | Freq: Every day | ORAL | Status: DC

## 2020-04-23 MED ORDER — POLYETHYLENE GLYCOL 3350 17 G PO PACK
17.0000 g | PACK | Freq: Every day | ORAL | Status: DC
  Administered 2020-04-24 – 2020-05-04 (×11): 17 g
  Filled 2020-04-23 (×11): qty 1

## 2020-04-23 MED ORDER — POLYETHYLENE GLYCOL 3350 17 G PO PACK
17.0000 g | PACK | Freq: Every day | ORAL | Status: DC
  Administered 2020-04-23: 17 g via ORAL
  Filled 2020-04-23: qty 1

## 2020-04-23 MED ORDER — DOCUSATE SODIUM 50 MG/5ML PO LIQD
100.0000 mg | Freq: Two times a day (BID) | ORAL | Status: DC
  Administered 2020-04-23 (×2): 100 mg via ORAL
  Filled 2020-04-23: qty 10

## 2020-04-23 MED ORDER — DOCUSATE SODIUM 50 MG/5ML PO LIQD
50.0000 mg | Freq: Two times a day (BID) | ORAL | Status: DC
  Filled 2020-04-23: qty 10

## 2020-04-23 MED ORDER — LABETALOL HCL 5 MG/ML IV SOLN
5.0000 mg | INTRAVENOUS | Status: AC | PRN
  Administered 2020-04-23: 10 mg via INTRAVENOUS
  Administered 2020-04-24: 20 mg via INTRAVENOUS
  Administered 2020-04-25: 10 mg via INTRAVENOUS
  Administered 2020-04-26 – 2020-04-29 (×5): 20 mg via INTRAVENOUS
  Administered 2020-05-02: 10 mg via INTRAVENOUS
  Administered 2020-05-04: 20 mg via INTRAVENOUS
  Filled 2020-04-23 (×12): qty 4

## 2020-04-23 MED ORDER — LINAGLIPTIN 5 MG PO TABS
5.0000 mg | ORAL_TABLET | Freq: Every day | ORAL | Status: DC
  Administered 2020-04-24 – 2020-05-15 (×21): 5 mg
  Filled 2020-04-23 (×21): qty 1

## 2020-04-23 MED ORDER — DILTIAZEM 12 MG/ML ORAL SUSPENSION
90.0000 mg | Freq: Four times a day (QID) | ORAL | Status: DC
  Administered 2020-04-23 – 2020-05-11 (×70): 90 mg
  Filled 2020-04-23 (×78): qty 9

## 2020-04-23 NOTE — Progress Notes (Signed)
Nutrition Follow-up  RD working remotely.  DOCUMENTATION CODES:   Morbid obesity  INTERVENTION:  - recommend Radiology place post-pyloric tube under flouro using C-arm. - will order Vital AF 1.2 @ 20 ml/hr with 90 ml Prosource TF QID. - this regimen will provide 896 kcal, 124 grams protein, and 389 ml free water.  - free water flush per CCM.    NUTRITION DIAGNOSIS:   Increased nutrient needs related to acute illness, catabolic illness (YJEHU-31 infection) as evidenced by estimated needs -ongoing  GOAL:   Provide needs based on ASPEN/SCCM guidelines -unable to meet at this time.   MONITOR:   Vent status, TF tolerance, Labs, Weight trends, I & O's  REASON FOR ASSESSMENT:   Consult Enteral/tube feeding initiation and management  ASSESSMENT:   54 year-old female with medical history of Crohn's disease/IBS, obesty, asthma, arthritis, sleep apnea, HLD, DM, and HTN. She was transferred to [facility] from [facility] d/t COVID-19 PNA. She is not COVID vaccinated.  Patient remains intubated with R nare NGT in place (gastric placement). She was proned yesterday at ~1800 and plan for 16 hours proned/8 hours supine. CCM note from today states patient will likely need at least 2 rounds of proning. She vomited TF yesterday; 2 doses IV reglan given after that event.   Note from today also states hopeful for placement of post-pyloric panda tube and to re-start TF at trickle rate once patient is supine and tube able to be placed.   Able to communicate with RN who reports plan for return to supine at ~1230 and she confirms plan for panda tube placement at that time.   Weight +1.2 kg/3 lb compared to yesterday. Generalized, non-pitting edema documented in flow sheet.     Patient is currently intubated on ventilator support MV: 10.8 L/min Temp (24hrs), Avg:98.6 F (37 C), Min:98 F (36.7 C), Max:99.3 F (37.4 C) Propofol: none  Labs reviewed; CBGs: 167 and 152 mg/dl, BUN: 26 mg/dl,  creatinine: 0.38 mg/dl, Ca: 8.1 mg/dl, Phos: 2.1 mg/dl, Mg: 2.5 mg/dl. Medications reviewed; 1000 unites cholecalciferol/day, sliding scale novolog, 19 units levemir BID, 63.75 mg solu-medrol BID, 5 mg IV reglan x2 doses 8/23, 1 tablet multivitamin with minerals/day, 40 mg protonix/day, 10 mmol IV KCPhos x1 run 8/24.  Drips; nimbex @ 2.5 mcg/kg/min, fentanyl @ 125 mcg/hr, versed @ 2 mg/hr   Diet Order:   Diet Order            Diet NPO time specified  Diet effective now                 EDUCATION NEEDS:   No education needs have been identified at this time  Skin:  Skin Assessment: Reviewed RN Assessment  Last BM:  8/20  Height:   Ht Readings from Last 1 Encounters:  04/21/20 5' 3"  (1.6 m)    Weight:   Wt Readings from Last 1 Encounters:  04/23/20 129.3 kg     Estimated Nutritional Needs:  Kcal:  1660 kcal Protein:  >/= 104 grams Fluid:  >/= 1.8 L/day     Jarome Matin, MS, RD, LDN, CNSC Inpatient Clinical Dietitian RD pager # available in AMION  After hours/weekend pager # available in [facility]

## 2020-04-23 NOTE — Progress Notes (Signed)
   SBP consisently  High > 200 with MAP 128 for few hours despite lasix and sedation increase  Suspect steroid induced  Plan -dc ace inhibitor - get cxr  -start labtetalol - goal sbp < 170    SIGNATURE    Dr. Brand Males, M.D., F.C.C.P,  Pulmonary and Critical Care Medicine Staff Physician, [place] Director - Interstitial Lung Disease  Program  [facility] [place], [place], [address]  Pager: [phone], If no answer  OR between  19:00-7:00h: page [phone] Telephone (clinical office): [phone] Telephone (research): [phone]  4:49 PM 04/23/2020

## 2020-04-23 NOTE — Progress Notes (Addendum)
NAME:  BRIGHTYN MOZER, MRN:  [id number], DOB:  06-13-1966, LOS: 3 ADMISSION DATE:  04/20/2020, CONSULTATION DATE:  04/21/20 REFERRING MD:  , CHIEF COMPLAINT: short of breath  Brief History   54 yo female tested positive for COVID 19 on 8/09.  Had progressive symptoms and admitted to [facility] on 8/17.  Treated with remdesivir, prednisone and ABx for CAP.  She developed worsening hypoxia and required intubation.  She was transferred from [facility] with VDRF from [place] 19 pneumonia with ARDS.  Past Medical History  Crohn's colitis, Carpal tunnel, Osteoarthritis, DM type 2, Asthma, HTN, HLD, OSA, Morbid obesity with BMI 49.64, Depression  Significant Hospital Events   8/22 transfer from [place] on full vent support, sedation. 8/23 - started Nimbex and  cycle # prone 16h/supine 8h in pm - ARDS protocol  Consults:    Procedures:  ETT 8/21 >>  RUE PICC ? date  Significant Diagnostic Tests:    Micro Data:  COVID 8/09 >> positive COVID 8/17 >> positive MRSA PCR 8/22 >> Blood 8/22 >> Sputum 8/22 >>  Antimicrobials:  Remdesivir 8/17 >> Steroids 8/17 >>  No Immune modulator eg Acterma (hx of crohns) xxxxxx     Interim history/subjective:   04/23/2020 - still prone. fio2 down  To 40%. Deeply sedated and paralyzed. Not on pressors. Making  Urine. CXR with severe ARDS. BIS 30s. PF ratio improved to 175 while prone   Objective   Blood pressure (!) 141/53, pulse 94, temperature 98.9 F (37.2 C), temperature source Axillary, resp. rate (!) 35, height 5' 3"  (1.6 m), weight 129.3 kg, SpO2 96 %.    Vent Mode: PRVC FiO2 (%):  [50 %-90 %] 50 % Set Rate:  [30 bmp-35 bmp] 35 bmp Vt Set:  [320 mL] 320 mL PEEP:  [8 cmH20-10 cmH20] 10 cmH20 Plateau Pressure:  [25 cmH20-29 cmH20] 25 cmH20   Intake/Output Summary (Last 24 hours) at 04/23/2020 0944 Last data filed at 04/23/2020 3546 Gross per 24 hour  Intake 1388.92 ml  Output 750 ml  Net 638.92 ml   Filed  Weights   04/21/20 0500 04/22/20 0424 04/23/20 0500  Weight: 127.1 kg 128.1 kg 129.3 kg    Examination: General Appearance:  Looks criticall ill OBESE - + Head:  Normocephalic, without obvious abnormality, atraumatic - prone exam Eyes:  PERRL - cann, conjunctiva/corneas - cannot examined Ears:  normal Nose:  G tube - prone Throat:  ETT TUBE - yes but is prone , OG tube - prone Neck:  Supple,  No enlargement/tenderness/nodules Lungs: Clear to auscultation bilaterally, Ventilator   Synchrony - ues Heart:  S1 and S2 normal, no murmur, CVP - x.  Pressors - np Abdomen:  Cannot examine due to prone Genitalia / Rectal:  Not done Extremities:  Extremities- intact Skin:  ntact in exposed areas . Sacral area - intact Neurologic:  Sedation - fent gtt, versed gtt, nimbex gtt -> RASS - -5/bis 30s       Resolved Hospital Problem list   High TGL - holding diprivan  Assessment & Plan:   Acute hypoxic/hypercapnic respiratory failure with ARDS from COVID 19 pneumonia. Hx of asthma, obstructive sleep apnea.   04/23/2020 - > does not meet criteria for SBT/Extubation in setting of Acute Respiratory Failure due to severe ARDS. Though PF ratio better when prone to 175  Plan - D2 nimbex gtt for vent syncrhony - continue prone 16h/supine8h cycles - likely will need 2+ cycles - goal SpO2 88  to 95% - goal Vt 6 cc/kg - f/u CXR, ABG - - pulmicort, brovana, prn albutero - get echo for completion  COVID-19 criticall ill infection, unvaccinated, immune suppressed baseline  plan - day 8 of solumedrol -  remedesivir per protocl - not candidate for tocilizumab or baricitinab since she has been on immunosuppressive therapy for Crohn's colitis  Acute metabolic encephalopathy 2nd to hypoxia, hypercapnia. Hx of depression.  04/23/2020 -  RASS -5 and BIS 30s on deep sedation and nimbex  Plan - RASS goal -4/-5  -  versed gtt and continue fent gtt  - d2 nimbex -> BIS Score < 60 - hold outpt   trazodone  Hx of Crohn's colitis.  04/23/2020 - tolrateing TF -  plan - hold outpt mercaptopurine, mesalamine - if she develops significant GI symptoms, them might need GI assessment >> followed by GI in [place] - when supne will need post pyloric tube ideall  Hx of HTN. - hold outpt  lisinopril, HCTZ, aldactone  DM type 2 poorly controlled with steroid induced hyperglycemia. - SSI with levemir and tradjenta  Electrolyte imbalance  04/23/2020 - low phos  Plan  - replete phos  Best practice:  Diet: tube feeds DVT prophylaxis: enoxaparin GI prophylaxis: protonix  Mobility: bed Code Status: full  Disposition: ICU  Family:   - 8/23 - Husband Levonia Wolfley [phone] - updated in detail and sister-law Sharyn Lull (RT) x 2 - 8/24 - husband Tempie Donning updated    [place]   [name] is critically ill with multiple organ systems failure and requires high complexity decision making for assessment and support, frequent evaluation and titration of therapies, application of advanced monitoring technologies and extensive interpretation of multiple databases.   Critical Care Time devoted to patient care services described in this note is  40  Minutes. This time reflects time of care of this signee Dr Brand Males. This critical care time does not reflect procedure time, or teaching time or supervisory time of PA/NP/Med student/Med Resident etc but could involve care discussion time     Dr. Brand Males, M.D., [facility].C.P Pulmonary and Critical Care Medicine Staff Physician [place] [facility] Pager: [phone], If no answer or between  15:00h - 7:00h: call [phone]  04/23/2020 10:26 AM    LABS    PULMONARY Recent Labs  Lab 04/22/20 0230 04/22/20 1118 04/22/20 1700 04/22/20 2025 04/23/20 0439  PHART 7.347* 7.232* 7.280* 7.368 7.403  PCO2ART 54.3* 74.0* 71.1* 58.4* 53.6*  PO2ART  68.4* 92.8 62.9* 60.9* 69.6*  HCO3 29.0* 30.0* 32.4* 33.0* 32.7*  O2SAT 93.3 96.5 90.6 92.5 94.0    CBC Recent Labs  Lab 04/21/20 0243 04/22/20 0227 04/23/20 0431  HGB 12.7 12.9 13.4  HCT 35.9* 38.4 41.3  WBC 8.7 8.6 14.8*  PLT 196 178 234    COAGULATION Recent Labs  Lab 04/23/20 0431  INR 1.2    CARDIAC  No results for input(s): TROPONINI in the last 168 hours. No results for input(s): PROBNP in the last 168 hours.   CHEMISTRY Recent Labs  Lab 04/21/20 0243 04/21/20 0533 04/21/20 0533 04/21/20 1134 04/21/20 1630 04/22/20 0227 04/23/20 0431  NA  --  136  --   --   --  146* 143  K  --  3.3*   < >  --   --  3.9 3.9  CL  --  103  --   --   --  110 105  CO2  --  25  --   --   --  28 30  GLUCOSE  --  187*  --   --   --  232* 174*  BUN  --  14  --   --   --  22* 26*  CREATININE 0.67 0.56  --   --   --  0.55 0.38*  CALCIUM  --  7.4*  --   --   --  8.3* 8.1*  MG  --   --   --  2.1 2.3 2.5* 2.5*  PHOS  --   --   --  2.6 2.4* 2.0* 2.1*   < > = values in this interval not displayed.   Estimated Creatinine Clearance: 106.8 mL/min (A) (by C-G formula based on SCr of 0.38 mg/dL (L)).   LIVER Recent Labs  Lab 04/21/20 0533 04/22/20 0227 04/23/20 0431  AST 37 28 17  ALT 36 33 31  ALKPHOS 95 91 80  BILITOT 0.9 0.7 0.7  PROT 5.1* 5.8* 5.8*  ALBUMIN 2.1* 2.2* 2.3*  INR  --   --  1.2     INFECTIOUS No results for input(s): LATICACIDVEN, PROCALCITON in the last 168 hours.   ENDOCRINE CBG (last 3)  Recent Labs    04/22/20 2327 04/23/20 0348 04/23/20 0810  GLUCAP 142* 167* 152*         IMAGING x48h  - image(s) personally visualized  -   highlighted in bold DG CHEST PORT 1 VIEW  Result Date: 04/23/2020 CLINICAL DATA:  COVID-19, intubation EXAM: PORTABLE CHEST 1 VIEW COMPARISON:  Portable exam 0448 hours compared to [id number] hours FINDINGS: Tip of endotracheal tube projects approximately 2.1 cm above carina. Nasogastric tube extends into stomach.  RIGHT arm PICC line unchanged. Stable heart size mediastinal contours. Severe diffuse BILATERAL airspace infiltrates consistent with multifocal pneumonia and history of COVID-19, slightly increased in RIGHT upper lobe. No pleural effusion or pneumothorax. IMPRESSION: Multifocal pneumonia consistent with history of COVID-19, slightly increased. Electronically Signed   By: Lavonia Dana M.D.   On: 04/23/2020 07:16   DG Chest Port 1 View  Result Date: 04/22/2020 CLINICAL DATA:  Endotracheal tube placement EXAM: PORTABLE CHEST 1 VIEW COMPARISON:  4:56 a.m. FINDINGS: Endotracheal tube is seen 3.6 cm above the carina. Right upper extremity PICC line with its tip within the superior cavoatrial junction and nasogastric tube extending into the upper abdomen beyond the margin of the examination are unchanged. Extensive multifocal airspace infiltrates are again identified, progressive within the right upper lobe. There is diminished pulmonary insufflation when compared to prior examination. Cardiac size is at the upper limits of normal. No pneumothorax or pleural effusion. IMPRESSION: 1. Lines and tubes in satisfactory position. 2. Extensive multifocal airspace infiltrates, progressive within the right upper lobe. 3. Diminished pulmonary vascular insufflation when compared to prior examination. Electronically Signed   By: Fidela Salisbury MD   On: 04/22/2020 20:58   DG Chest Port 1 View  Result Date: 04/22/2020 CLINICAL DATA:  Respiratory failure. ARDS secondary to COVID 19 virus. EXAM: PORTABLE CHEST 1 VIEW COMPARISON:  One-view chest x-ray 04/21/2020 FINDINGS: Heart size is exaggerated by low volumes. Diffuse interstitial and airspace opacities are similar the prior exams. Tracheal tube enteric tube are stable. IMPRESSION: Stable appearance of diffuse interstitial and airspace disease consistent with ARDS. Electronically Signed   By: San Morelle M.D.   On: 04/22/2020 06:30

## 2020-04-23 NOTE — Progress Notes (Signed)
Patient unproned at this time; ABG 1 hour post supine - appox 1415. ETT holder in place.

## 2020-04-24 ENCOUNTER — Inpatient Hospital Stay (HOSPITAL_COMMUNITY): Payer: BC Managed Care – PPO

## 2020-04-24 DIAGNOSIS — J9601 Acute respiratory failure with hypoxia: Secondary | ICD-10-CM

## 2020-04-24 LAB — CBC
HCT: 36.7 % (ref 36.0–46.0)
Hemoglobin: 11.7 g/dL — ABNORMAL LOW (ref 12.0–15.0)
MCH: 33.1 pg (ref 26.0–34.0)
MCHC: 31.9 g/dL (ref 30.0–36.0)
MCV: 103.7 fL — ABNORMAL HIGH (ref 80.0–100.0)
Platelets: 173 10*3/uL (ref 150–400)
RBC: 3.54 MIL/uL — ABNORMAL LOW (ref 3.87–5.11)
RDW: 12 % (ref 11.5–15.5)
WBC: 7.9 10*3/uL (ref 4.0–10.5)
nRBC: 0 % (ref 0.0–0.2)

## 2020-04-24 LAB — GLUCOSE, CAPILLARY
Glucose-Capillary: 207 mg/dL — ABNORMAL HIGH (ref 70–99)
Glucose-Capillary: 211 mg/dL — ABNORMAL HIGH (ref 70–99)
Glucose-Capillary: 222 mg/dL — ABNORMAL HIGH (ref 70–99)
Glucose-Capillary: 222 mg/dL — ABNORMAL HIGH (ref 70–99)
Glucose-Capillary: 226 mg/dL — ABNORMAL HIGH (ref 70–99)
Glucose-Capillary: 245 mg/dL — ABNORMAL HIGH (ref 70–99)

## 2020-04-24 LAB — COMPREHENSIVE METABOLIC PANEL
ALT: 23 U/L (ref 0–44)
AST: 13 U/L — ABNORMAL LOW (ref 15–41)
Albumin: 1.9 g/dL — ABNORMAL LOW (ref 3.5–5.0)
Alkaline Phosphatase: 63 U/L (ref 38–126)
Anion gap: 7 (ref 5–15)
BUN: 43 mg/dL — ABNORMAL HIGH (ref 6–20)
CO2: 30 mmol/L (ref 22–32)
Calcium: 7.6 mg/dL — ABNORMAL LOW (ref 8.9–10.3)
Chloride: 108 mmol/L (ref 98–111)
Creatinine, Ser: 0.61 mg/dL (ref 0.44–1.00)
GFR calc Af Amer: >60 mL/min (ref >60)
GFR calc non Af Amer: >60 mL/min (ref >60)
Glucose, Bld: 222 mg/dL — ABNORMAL HIGH (ref 70–99)
Potassium: 3.4 mmol/L — ABNORMAL LOW (ref 3.5–5.1)
Sodium: 145 mmol/L (ref 135–145)
Total Bilirubin: 1 mg/dL (ref 0.3–1.2)
Total Protein: 4.8 g/dL — ABNORMAL LOW (ref 6.5–8.1)

## 2020-04-24 LAB — BLOOD GAS, ARTERIAL
Acid-Base Excess: 8.3 mmol/L — ABNORMAL HIGH (ref 0.0–2.0)
Bicarbonate: 33.8 mmol/L — ABNORMAL HIGH (ref 20.0–28.0)
Drawn by: 560031
FIO2: 40
MECHVT: 320 mL
O2 Saturation: 92.3 %
PEEP: 10 cmH2O
Patient temperature: 98.2
RATE: 35 resp/min
pCO2 arterial: 51.7 mmHg — ABNORMAL HIGH (ref 32.0–48.0)
pH, Arterial: 7.432 (ref 7.350–7.450)
pO2, Arterial: 64.1 mmHg — ABNORMAL LOW (ref 83.0–108.0)

## 2020-04-24 LAB — URINE CULTURE: Culture: 90000 — AB

## 2020-04-24 LAB — CULTURE, RESPIRATORY W GRAM STAIN: Culture: NORMAL

## 2020-04-24 LAB — ECHOCARDIOGRAM COMPLETE
Area-P 1/2: 2.42 cm2
Height: 63 in
S' Lateral: 2.4 cm
Weight: 4430.36 oz

## 2020-04-24 LAB — PHOSPHORUS: Phosphorus: 3.8 mg/dL (ref 2.5–4.6)

## 2020-04-24 MED ORDER — POTASSIUM CHLORIDE 20 MEQ PO PACK
40.0000 meq | PACK | Freq: Once | ORAL | Status: AC
  Administered 2020-04-24: 40 meq
  Filled 2020-04-24: qty 2

## 2020-04-24 MED ORDER — FUROSEMIDE 10 MG/ML IJ SOLN
60.0000 mg | Freq: Three times a day (TID) | INTRAMUSCULAR | Status: DC
  Administered 2020-04-24 – 2020-04-28 (×12): 60 mg via INTRAVENOUS
  Filled 2020-04-24 (×12): qty 6

## 2020-04-24 MED ORDER — DOCUSATE SODIUM 50 MG/5ML PO LIQD: 100.0000 mg | Freq: Every day | ORAL | Status: DC | PRN

## 2020-04-24 MED ORDER — POTASSIUM CHLORIDE 20 MEQ/15ML (10%) PO SOLN
40.0000 meq | Freq: Two times a day (BID) | ORAL | Status: DC
  Administered 2020-04-24 – 2020-04-28 (×9): 40 meq
  Filled 2020-04-24 (×9): qty 30

## 2020-04-24 MED ORDER — DOCUSATE SODIUM 50 MG/5ML PO LIQD
100.0000 mg | Freq: Two times a day (BID) | ORAL | Status: DC
  Administered 2020-04-24 – 2020-05-05 (×23): 100 mg
  Filled 2020-04-24 (×23): qty 10

## 2020-04-24 MED ORDER — FUROSEMIDE 10 MG/ML IJ SOLN: 40.0000 mg | Freq: Three times a day (TID) | INTRAMUSCULAR | Status: DC

## 2020-04-24 MED ORDER — VITAL AF 1.2 CAL PO LIQD
1000.0000 mL | ORAL | Status: AC
  Administered 2020-04-24 – 2020-04-30 (×8): 1000 mL

## 2020-04-24 MED FILL — Midazolam HCl Inj 5 MG/5ML (Base Equivalent): INTRAMUSCULAR | Qty: 5 | Status: AC

## 2020-04-24 MED FILL — Fentanyl Citrate Preservative Free (PF) Inj 100 MCG/2ML: INTRAMUSCULAR | Qty: 6 | Status: AC

## 2020-04-24 NOTE — Progress Notes (Signed)
  Echocardiogram 2D Echocardiogram has been performed.  [name] 04/24/2020, 2:46 PM

## 2020-04-24 NOTE — Progress Notes (Addendum)
NAME:  [name], MRN:  [id number], DOB:  05/17/1966, LOS: 4 ADMISSION DATE:  04/20/2020, CONSULTATION DATE:  04/21/20 REFERRING MD:  , CHIEF COMPLAINT: short of breath  Brief History   54 yo female tested positive for COVID 19 on 8/09.  Had progressive symptoms and admitted to [facility] on 8/17.  Treated with remdesivir, prednisone and ABx for CAP.  She developed worsening hypoxia and required intubation.  She was transferred from [facility] with VDRF from [place] 19 pneumonia with ARDS.  Past Medical History  Crohn's colitis, Carpal tunnel, Osteoarthritis, DM type 2, Asthma, HTN, HLD, OSA, Morbid obesity with BMI 49.64, Depression  Significant Hospital Events   8/22 transfer from [place] on full vent support, sedation. 8/23 - started Nimbex and  cycle # prone 16h/supine 8h in pm - ARDS protocol 8/24 - - still prone. fio2 down  To 40%. Deeply sedated and paralyzed. Not on pressors. Making  Urine. CXR with severe ARDS. BIS 30s. PF ratio improved to 175 while prone   Consults:    Procedures:  ETT 8/21 >>  RUE PICC ? date  Significant Diagnostic Tests:    Micro Data:  COVID 8/09 >> positive COVID 8/17 >> positive MRSA PCR 8/22 >> Blood 8/22 >> Sputum 8/22 >> Urine 8/23 - GPC  Antimicrobials:  Remdesivir 8/17 >> Steroids 8/17 >> (10 days) No Immune modulator eg Acterma (hx of crohns) xxxxxx  Antibiotics Given (last 72 hours)    None        Interim history/subjective:   04/24/2020 - cotninues vent, 40%, peep 10. nimbex, fent gtt, versed gtt. -> BIS. On Cycle #2 prone - will be supine in this cycle later this afternoon. D3 nimbex. Noted: PF ratio dropped when supine yesterday. Makin gurine. Not on pressors. Hypertensive yesterday and needed scheduled cardizem and labetalol to achieve control. Afebrile. Urine with sediments - growing GPC  On trickle TF  Objective   Blood pressure (!) 139/53, pulse 83, temperature 98.4 F (36.9 C), resp.  rate (!) 35, height 5' 3"  (1.6 m), weight 125.6 kg, SpO2 94 %.    Vent Mode: PRVC FiO2 (%):  [40 %-50 %] 40 % Set Rate:  [35 bmp] 35 bmp Vt Set:  [320 mL] 320 mL PEEP:  [10 cmH20] 10 cmH20 Plateau Pressure:  [22 cmH20-29 cmH20] 23 cmH20   Intake/Output Summary (Last 24 hours) at 04/24/2020 1025 Last data filed at 04/24/2020 3748 Gross per 24 hour  Intake 1646.46 ml  Output 1825 ml  Net -178.54 ml   Filed Weights   04/22/20 0424 04/23/20 0500 04/24/20 0600  Weight: 128.1 kg 129.3 kg 125.6 kg    Examination: General Appearance:  Looks criticall ill OBESE - +. PRONE Head:  Normocephalic, without obvious abnormality, atraumatic Eyes:  PERRL - cannot examined, conjunctiva/corneas - cannot examine     Ears:  Normal external ear canals, both ears Nose:  G tube - yes Throat:  ETT TUBE - yes , OG tube - no Neck:  Supple,  No enlargement/tenderness/nodules Lungs: Clear to auscultation bilaterally, Ventilator   Synchrony - yes Heart:  S1 and S2 normal, no murmur, CVP - x.  Pressors - no Abdomen:  Soft, no masses, no organomegaly Genitalia / Rectal:  Not done Extremities:  Extremities- intact Skin:  ntact in exposed areas . Sacral area - has tape Neurologic:  Sedation - deep sedatin and nimbex -> RASS - -5/BIS < 40         Resolved  Hospital Problem list   High TGL - holding diprivan  Assessment & Plan:   Acute hypoxic/hypercapnic respiratory failure with ARDS from COVID 19 pneumonia. Hx of asthma, obstructive sleep apnea.  04/24/2020 - > does not meet criteria for SBT/Extubation in setting of Acute Respiratory Failure due to COVID ARDS   Plan - D3 nimbex gtt for vent syncrhony - continue cycle #2 prone 16h/supine8h cycles - likely will need more cucles - goal SpO2 88 to 95% - goal Vt 6 cc/kg - f/u CXR, ABG - - pulmicort, brovana, prn albutero - get echo for completion -start scheduled lasix based on FACTT trial data for negative balance  COVID-19 criticall ill  infection, unvaccinated, immune suppressed baseline  plan - day 9 of solumedrol -  remedesivir per protocl - not candidate for tocilizumab or baricitinab since she has been on immunosuppressive therapy for Crohn's colitis  Acute metabolic encephalopathy 2nd to hypoxia, hypercapnia. Hx of depression.  04/24/2020 -  RASS -5 and BIS 30s on deep sedation and nimbex  Plan - RASS goal -4/-5  -  versed gtt and continue fent gtt  - d3 nimbex -> BIS Score < 60 - can try liberation 04/25/20  - hold outpt  trazodone  Hx of Crohn's colitis.  04/24/2020 - tolrateing TF at 20cc/.h  plan - hold outpt mercaptopurine, mesalamine - if she develops significant GI symptoms, them might need GI assessment >> followed by GI in [place] - when supne will need post pyloric tube ideall  Hx of HTN. - 8/25 - SBP very high yesterday and improved after bp meds  Plan  - cotinue cardizem and labetalol   DM type 2 poorly controlled with steroid induced hyperglycemia. - SSI with levemir and tradjenta  Electrolyte imbalance  04/24/2020 - low K  Plan  - replete K   - monitor in setting of lasix  Best practice:  Diet: tube feeds DVT prophylaxis: enoxaparin GI prophylaxis: protonix  Mobility: bed Code Status: full  Disposition: ICU  Family:   - 8/23 - Husband Bernadean Saling [phone] - updated in detail and sister-law Sharyn Lull (RT) x 2 - 8/24 - husband Prachi Oftedahl updated and later sister in law Sharyn Lull over phone 8/25- husband Doyle Tegethoff   ATTESTATION & SIGNATURE   [name] is critically ill with multiple organ systems failure and requires high complexity decision making for assessment and support, frequent evaluation and titration of therapies, application of advanced monitoring technologies and extensive interpretation of multiple databases.   Critical Care Time devoted to patient care services described in this note is  40  Minutes. This time reflects time of  care of this signee Dr Brand Males. This critical care time does not reflect procedure time, or teaching time or supervisory time of PA/NP/Med student/Med Resident etc but could involve care discussion time     Dr. Brand Males, M.D., [facility].C.P Pulmonary and Critical Care Medicine Staff Physician [place] [facility] Pager: [phone], If no answer or between  15:00h - 7:00h: call [phone]  04/24/2020 10:34 AM    LABS    PULMONARY Recent Labs  Lab 04/22/20 1118 04/22/20 1700 04/22/20 2025 04/23/20 0439 04/23/20 1420  PHART 7.232* 7.280* 7.368 7.403 7.433  PCO2ART 74.0* 71.1* 58.4* 53.6* 52.2*  PO2ART 92.8 62.9* 60.9* 69.6* 53.6*  HCO3 30.0* 32.4* 33.0* 32.7* 34.3*  O2SAT 96.5 90.6 92.5 94.0 89.1    CBC Recent Labs  Lab 04/22/20 0227 04/23/20 0431 04/24/20 0440  HGB 12.9 13.4 11.7*  HCT 38.4 41.3 36.7  WBC 8.6 14.8* 7.9  PLT 178 234 173    COAGULATION Recent Labs  Lab 04/23/20 0431  INR 1.2    CARDIAC  No results for input(s): TROPONINI in the last 168 hours. No results for input(s): PROBNP in the last 168 hours.   CHEMISTRY Recent Labs  Lab 04/21/20 0243 04/21/20 0533 04/21/20 0533 04/21/20 1134 04/21/20 1630 04/22/20 0227 04/22/20 0227 04/23/20 0431 04/24/20 0440  NA  --  136  --   --   --  146*  --  143 145  K  --  3.3*   < >  --   --  3.9   < > 3.9 3.4*  CL  --  103  --   --   --  110  --  105 108  CO2  --  25  --   --   --  28  --  30 30  GLUCOSE  --  187*  --   --   --  232*  --  174* 222*  BUN  --  14  --   --   --  22*  --  26* 43*  CREATININE 0.67 0.56  --   --   --  0.55  --  0.38* 0.61  CALCIUM  --  7.4*  --   --   --  8.3*  --  8.1* 7.6*  MG  --   --   --  2.1 2.3 2.5*  --  2.5*  --   PHOS  --   --   --  2.6 2.4* 2.0*  --  2.1* 3.8   < > = values in this interval not displayed.   Estimated Creatinine Clearance: 104.9 mL/min (by C-G formula based on SCr of 0.61  mg/dL).   LIVER Recent Labs  Lab 04/21/20 0533 04/22/20 0227 04/23/20 0431 04/24/20 0440  AST 37 28 17 13*  ALT 36 33 31 23  ALKPHOS 95 91 80 63  BILITOT 0.9 0.7 0.7 1.0  PROT 5.1* 5.8* 5.8* 4.8*  ALBUMIN 2.1* 2.2* 2.3* 1.9*  INR  --   --  1.2  --      INFECTIOUS No results for input(s): LATICACIDVEN, PROCALCITON in the last 168 hours.   ENDOCRINE CBG (last 3)  Recent Labs    04/23/20 2345 04/24/20 0440 04/24/20 0810  GLUCAP 197* 207* 226*         IMAGING x48h  - image(s) personally visualized  -   highlighted in bold DG Abd 1 View  Result Date: 04/23/2020 CLINICAL DATA:  Encounter for NG tube placement. EXAM: ABDOMEN - 1 VIEW COMPARISON:  Earlier today. FINDINGS: The weighted enteric tube tip is in the right upper quadrant, in the region of the distal stomach/first portion of the duodenum. Nonobstructive bowel gas pattern. Surgical clips in the right upper quadrant from cholecystectomy. IMPRESSION: Tip of the weighted enteric tube in the distal stomach/first portion of the duodenum. Electronically Signed   By: Keith Rake M.D.   On: 04/23/2020 17:17   DG Abd 1 View  Result Date: 04/23/2020 CLINICAL DATA:  Feeding tube placement EXAM: ABDOMEN - 1 VIEW COMPARISON:  04/22/2019 FINDINGS: Feeding tube with the tip projecting over the stomach. No bowel dilatation to suggest obstruction. No evidence of pneumoperitoneum, portal venous gas or pneumatosis. No pathologic calcifications along the expected course of the ureters. No  acute osseous abnormality. IMPRESSION: Feeding tube with the tip projecting over the stomach. Electronically Signed   By: Kathreen Devoid   On: 04/23/2020 14:56   DG CHEST PORT 1 VIEW  Result Date: 04/24/2020 CLINICAL DATA:  Hypoxia EXAM: PORTABLE CHEST 1 VIEW COMPARISON:  April 23, 2020 FINDINGS: Endotracheal tube tip is 2.4 cm above the carina. Feeding tube tip is below the diaphragm. Central catheter tip is in the superior vena cava. No  pneumothorax. There is airspace opacity throughout the lungs bilaterally, similar to 1 day prior. No new opacity evident. Heart size and pulmonary vascularity are within normal limits. No evident adenopathy. No bone lesions. IMPRESSION: Tube and catheter positions as described without pneumothorax. Airspace opacity throughout the lungs bilaterally, stable. Stable cardiac silhouette. Electronically Signed   By: Lowella Grip III M.D.   On: 04/24/2020 08:10   DG Chest Port 1 View  Result Date: 04/23/2020 CLINICAL DATA:  Endotracheal tube placement, moving patient from prone to supine. EXAM: PORTABLE CHEST 1 VIEW COMPARISON:  Radiograph earlier this day. FINDINGS: Borderline low positioning of the endotracheal tube tip 13 mm from the carina. Enteric tube in place, tip included on concurrent abdominal radiographs. There is a right upper extremity PICC with tip in the mid SVC. Slight improvement in heterogeneous bilateral lung opacities from earlier today. Stable heart size and mediastinal contours. No evidence of pneumothorax or large pleural effusion. No visualized pneumomediastinum. IMPRESSION: 1. Borderline low positioning of the endotracheal tube tip 13 mm from the carina. 2. Right upper extremity PICC with tip in the mid SVC. 3. Slight improvement in bilateral heterogeneous lung opacities from earlier today. Electronically Signed   By: Keith Rake M.D.   On: 04/23/2020 17:19   DG CHEST PORT 1 VIEW  Result Date: 04/23/2020 CLINICAL DATA:  COVID-19, intubation EXAM: PORTABLE CHEST 1 VIEW COMPARISON:  Portable exam 0448 hours compared to [id number] hours FINDINGS: Tip of endotracheal tube projects approximately 2.1 cm above carina. Nasogastric tube extends into stomach. RIGHT arm PICC line unchanged. Stable heart size mediastinal contours. Severe diffuse BILATERAL airspace infiltrates consistent with multifocal pneumonia and history of COVID-19, slightly increased in RIGHT upper lobe. No pleural  effusion or pneumothorax. IMPRESSION: Multifocal pneumonia consistent with history of COVID-19, slightly increased. Electronically Signed   By: Lavonia Dana M.D.   On: 04/23/2020 07:16   DG Chest Port 1 View  Result Date: 04/22/2020 CLINICAL DATA:  Endotracheal tube placement EXAM: PORTABLE CHEST 1 VIEW COMPARISON:  4:56 a.m. FINDINGS: Endotracheal tube is seen 3.6 cm above the carina. Right upper extremity PICC line with its tip within the superior cavoatrial junction and nasogastric tube extending into the upper abdomen beyond the margin of the examination are unchanged. Extensive multifocal airspace infiltrates are again identified, progressive within the right upper lobe. There is diminished pulmonary insufflation when compared to prior examination. Cardiac size is at the upper limits of normal. No pneumothorax or pleural effusion. IMPRESSION: 1. Lines and tubes in satisfactory position. 2. Extensive multifocal airspace infiltrates, progressive within the right upper lobe. 3. Diminished pulmonary vascular insufflation when compared to prior examination. Electronically Signed   By: Fidela Salisbury MD   On: 04/22/2020 20:58

## 2020-04-24 NOTE — Progress Notes (Signed)
Inpatient Diabetes Program Recommendations  AACE/ADA: New Consensus Statement on Inpatient Glycemic Control (2015)  Target Ranges:  Prepandial:   less than 140 mg/dL      Peak postprandial:   less than 180 mg/dL (1-2 hours)      Critically ill patients:  140 - 180 mg/dL   Lab Results  Component Value Date   GLUCAP 226 (H) 04/24/2020   HGBA1C 6.1 (H) 11/08/2019    Review of Glycemic Control Results for [name], [name] (MRN [id number]) as of 04/22/2020 11:09  Ref. Range 04/21/2020 16:20 04/21/2020 19:32 04/22/2020 00:28 04/22/2020 04:14 04/22/2020 08:16  Glucose-Capillary Latest Ref Range: 70 - 99 mg/dL 231 (H) 213 (H) 224 (H) 198 (H) 204 (H)  Results for [name], [name] (MRN [id number]) as of 04/24/2020 09:35  Ref. Range 04/23/2020 08:10 04/23/2020 12:42 04/23/2020 16:11 04/23/2020 20:20 04/23/2020 23:45 04/24/2020 04:40 04/24/2020 08:10  Glucose-Capillary Latest Ref Range: 70 - 99 mg/dL 152 (H) 163 (H) 176 (H) 193 (H) 197 (H) 207 (H) 226 (H)  Diabetes history: DM 2 Outpatient Diabetes medications: Jardiance 25 mg Daily, unsure of insulin doses prior to admission Current orders for Inpatient glycemic control:  Levemir 19 units bid Novolog 0-20 units Q4 hours Tradjenta 5 mg Daily  Solumedrol 63.75 mg Q12 hours Vital AF  20 ml/hour  Inpatient Diabetes Program Recommendations:    - add Novolog 3-4 units Q4 hours Tube Feed Coverage.  Thanks,  Tama Headings RN, MSN, BC-ADM Inpatient Diabetes Coordinator Team Pager [phone] (8a-5p)

## 2020-04-24 NOTE — Progress Notes (Signed)
Nutrition Follow-up  DOCUMENTATION CODES:   Morbid obesity  INTERVENTION:  - will adjust TF regimen: Vital AF 1.2 @ 30 ml/hr to advance by 10 ml every 4 hours to reach goal rate of 60 ml/hr. - at goal rate, this regimen will provide 1758 kcal, 108 grams protein, and 1168 ml free water. - free water flush to continue to be per CCM.  NUTRITION DIAGNOSIS:   Increased nutrient needs related to acute illness, catabolic illness (EXHBZ-16 infection) as evidenced by estimated needs. -ongoing  GOAL:   Provide needs based on ASPEN/SCCM guidelines -unmet currently   MONITOR:   Vent status, TF tolerance, Labs, Weight trends, I & O's  ASSESSMENT:   54 year-old female with medical history of Crohn's disease/IBS, obesty, asthma, arthritis, sleep apnea, HLD, DM, and HTN. She was transferred to [facility] from [facility] d/t COVID-19 PNA. She is not COVID vaccinated.  Significant Events: 8/22- transfer from [facility] to [place]; was intubated and R nare NGT placed prior to transfer; TF initiation  8/23- initial RD assessment; vomiting x1 in the AM 8/24- begin cycles of 16 hours proned, 8 hours supine; small bore feeding tube placed   Patient discussed in rounds this AM. She remains intubated and small bore feeding tube in place. Imaging report states tip in distal stomach vs first portion of duodenum; RD looked at image.   She is currently receiving Vital AF 1.2 @ 20 ml/hr with 90 ml Prosource TF QID and 100 ml free water every 4 hours. This regimen is providing 896 kcal, 124 grams protein, and 989 ml free water.   RN reports patient is tolerating TF regimen without issue. Verbal order received to increase TF.   Patient is currently proned. Plan is to hold TF at 1200 and to flip to supine at 1300.    Patient is currently intubated on ventilator support MV: 10.8 L/min Temp (24hrs), Avg:98.2 F (36.8 C), Min:97.5 F (36.4 C), Max:99.6 F (37.6 C) Propofol: none BP: 141/53 and MAP:  81  Labs reviewed; CBGs: 207 and 226 mg/dl, K: 3.4 mmol/l, BUN: 43 mg/dl, Ca: 7.6 mg/dl.  Medications reviewed; 1000 units cholecalciferol/day, 100 mg colace BID, 40 mg IV lasix x1 dose 8/24, 60 mg IV lasix TID, sliding scale novolog, 19 units levemir BID, 5 mg tradjenta/day, 63.75 mg solu-medrol BID, 1 tablet multivitamin with minerals/day, 17 g miralax/day, 40 mEq Klor-Con x1 dose 8/25, 40 mEq KCl BID, 10 mmol IV KCl x1 dose 8/24. Drips; nimbex @ 3 mcg/kg/min, fentanyl @ 200 mcg/hr, versed @ 2 mg/hr.    Diet Order:   Diet Order            Diet NPO time specified  Diet effective now                 EDUCATION NEEDS:   No education needs have been identified at this time  Skin:  Skin Assessment: Reviewed RN Assessment  Last BM:  8/20  Height:   Ht Readings from Last 1 Encounters:  04/21/20 5' 3"  (1.6 m)    Weight:   Wt Readings from Last 1 Encounters:  04/24/20 125.6 kg     Estimated Nutritional Needs:  Kcal:  1660 kcal Protein:  >/= 104 grams Fluid:  >/= 1.8 L/day     Jarome Matin, MS, RD, LDN, CNSC Inpatient Clinical Dietitian RD pager # available in [facility]  After hours/weekend pager # available in [facility]

## 2020-04-25 ENCOUNTER — Other Ambulatory Visit: Payer: Self-pay

## 2020-04-25 ENCOUNTER — Inpatient Hospital Stay (HOSPITAL_COMMUNITY): Payer: BC Managed Care – PPO

## 2020-04-25 LAB — GLUCOSE, CAPILLARY
Glucose-Capillary: 228 mg/dL — ABNORMAL HIGH (ref 70–99)
Glucose-Capillary: 233 mg/dL — ABNORMAL HIGH (ref 70–99)
Glucose-Capillary: 235 mg/dL — ABNORMAL HIGH (ref 70–99)
Glucose-Capillary: 235 mg/dL — ABNORMAL HIGH (ref 70–99)
Glucose-Capillary: 265 mg/dL — ABNORMAL HIGH (ref 70–99)
Glucose-Capillary: 304 mg/dL — ABNORMAL HIGH (ref 70–99)

## 2020-04-25 LAB — COMPREHENSIVE METABOLIC PANEL
ALT: 21 U/L (ref 0–44)
AST: 13 U/L — ABNORMAL LOW (ref 15–41)
Albumin: 2.2 g/dL — ABNORMAL LOW (ref 3.5–5.0)
Alkaline Phosphatase: 69 U/L (ref 38–126)
Anion gap: 9 (ref 5–15)
BUN: 46 mg/dL — ABNORMAL HIGH (ref 6–20)
CO2: 35 mmol/L — ABNORMAL HIGH (ref 22–32)
Calcium: 8.2 mg/dL — ABNORMAL LOW (ref 8.9–10.3)
Chloride: 103 mmol/L (ref 98–111)
Creatinine, Ser: 0.6 mg/dL (ref 0.44–1.00)
GFR calc Af Amer: >60 mL/min (ref >60)
GFR calc non Af Amer: >60 mL/min (ref >60)
Glucose, Bld: 258 mg/dL — ABNORMAL HIGH (ref 70–99)
Potassium: 4.2 mmol/L (ref 3.5–5.1)
Sodium: 147 mmol/L — ABNORMAL HIGH (ref 135–145)
Total Bilirubin: 0.8 mg/dL (ref 0.3–1.2)
Total Protein: 5.4 g/dL — ABNORMAL LOW (ref 6.5–8.1)

## 2020-04-25 LAB — CBC
HCT: 37.9 % (ref 36.0–46.0)
Hemoglobin: 12.2 g/dL (ref 12.0–15.0)
MCH: 33.6 pg (ref 26.0–34.0)
MCHC: 32.2 g/dL (ref 30.0–36.0)
MCV: 104.4 fL — ABNORMAL HIGH (ref 80.0–100.0)
Platelets: 158 10*3/uL (ref 150–400)
RBC: 3.63 MIL/uL — ABNORMAL LOW (ref 3.87–5.11)
RDW: 12.2 % (ref 11.5–15.5)
WBC: 7.1 10*3/uL (ref 4.0–10.5)
nRBC: 0 % (ref 0.0–0.2)

## 2020-04-25 LAB — PROTIME-INR
INR: 1.1 (ref 0.8–1.2)
Prothrombin Time: 14 seconds (ref 11.4–15.2)

## 2020-04-25 LAB — PHOSPHORUS: Phosphorus: 2.6 mg/dL (ref 2.5–4.6)

## 2020-04-25 LAB — MRSA PCR SCREENING: MRSA by PCR: NEGATIVE

## 2020-04-25 LAB — LACTIC ACID, PLASMA: Lactic Acid, Venous: 2.1 mmol/L (ref 0.5–1.9)

## 2020-04-25 LAB — MAGNESIUM: Magnesium: 2.5 mg/dL — ABNORMAL HIGH (ref 1.7–2.4)

## 2020-04-25 MED ORDER — INSULIN ASPART 100 UNIT/ML ~~LOC~~ SOLN
5.0000 [IU] | SUBCUTANEOUS | Status: DC
  Administered 2020-04-25 – 2020-04-30 (×29): 5 [IU] via SUBCUTANEOUS

## 2020-04-25 NOTE — Progress Notes (Signed)
NAME:  [name], MRN:  [id number], DOB:  05/29/1966, LOS: 5 ADMISSION DATE:  04/20/2020, CONSULTATION DATE:  04/21/20 REFERRING MD:  , CHIEF COMPLAINT: short of breath  Brief History   54 yo female tested positive for COVID 19 on 8/09.  Had progressive symptoms and admitted to [facility] on 8/17.  Treated with remdesivir, prednisone and ABx for CAP.  She developed worsening hypoxia and required intubation.  She was transferred from [facility] with VDRF from [place] 19 pneumonia with ARDS.  Past Medical History  Crohn's colitis, Carpal tunnel, Osteoarthritis, DM type 2, Asthma, HTN, HLD, OSA, Morbid obesity with BMI 49.64, Depression  Significant Hospital Events   8/22 transfer from [place] on full vent support, sedation. 8/23 - started Nimbex and  cycle # prone 16h/supine 8h in pm - ARDS protocol 8/24 - - still prone. fio2 down  To 40%. Deeply sedated and paralyzed. Not on pressors. Making  Urine. CXR with severe ARDS. BIS 30s. PF ratio improved to 175 while prone 8/25 - cotninues vent, 40%, peep 10. nimbex, fent gtt, versed gtt. -> BIS. On Cycle #2 prone - will be supine in this cycle later this afternoon. D3 nimbex. Noted: PF ratio dropped when supine yesterday. Makin gurine. Not on pressors. Hypertensive yesterday and needed scheduled cardizem and labetalol to achieve control. Afebrile. Urine with sediments - growing GPC. On trickle TF  Consults:    Procedures:  ETT 8/21 >>  RUE PICC ? date  Significant Diagnostic Tests:    Micro Data:  COVID 8/09 >> positive COVID 8/17 >> positive MRSA PCR 8/22 >> Blood 8/22 >> Sputum 8/22 >> Urine 8/23 - staphe epidermidis  Antimicrobials:  Remdesivir 8/17 >> Steroids 8/17 >> (10 days) No Immune modulator eg Acterma (hx of crohns) xxxxxxxxxx  Antibiotics Given (last 72 hours)    None       Interim history/subjective:   04/25/2020 - remains supine. No more proning due to PF ratio > 150. Is on fen  gtt, versed gtt, nimbex gtt. Diuresing well. On 40% fio2, peep down to 8. Not on pressors. Urine growing staph epi.   Objective   Blood pressure (!) 171/76, pulse 87, temperature 99.3 F (37.4 C), resp. rate (!) 35, height 5' 3"  (1.6 m), weight 124.6 kg, SpO2 92 %. CVP:  [8 mmHg] 8 mmHg  Vent Mode: PRVC FiO2 (%):  [40 %] 40 % Set Rate:  [35 bmp] 35 bmp Vt Set:  [320 mL] 320 mL PEEP:  [8 cmH20-10 cmH20] 8 cmH20 Plateau Pressure:  [23 cmH20-27 cmH20] 25 cmH20   Intake/Output Summary (Last 24 hours) at 04/25/2020 3976 Last data filed at 04/25/2020 0900 Gross per 24 hour  Intake 2298.11 ml  Output 6400 ml  Net -4101.89 ml   Filed Weights   04/23/20 0500 04/24/20 0600 04/25/20 0500  Weight: 129.3 kg 125.6 kg 124.6 kg    General Appearance:  Looks criticall ill OBESE - +. SUPINE Head:  Normocephalic, without obvious abnormality, atraumatic Eyes:  PERRL - yes, conjunctiva/corneas - muddy     Ears:  Normal external ear canals, both ears Nose:  G tube - panda+ Throat:  ETT TUBE - yes , OG tube - no Neck:  Supple,  No enlargement/tenderness/nodules Lungs: Clear to auscultation bilaterally, Ventilator   Synchrony - yes Heart:  S1 and S2 normal, no murmur, CVP - no.  Pressors - no Abdomen:  Soft, no masses, no organomegaly Genitalia / Rectal:  Not done Extremities:  Extremities- intact Skin:  ntact in exposed areas . Sacral area - not examined Neurologic:  Sedation - fent gtt, versed gtt, nimbex -> RASS - -5/BIS 40. TOF 4 +    Resolved Hospital Problem list   High TGL - holding diprivan  Assessment & Plan:   Acute hypoxic/hypercapnic respiratory failure with ARDS from COVID 19 pneumonia. Hx of asthma, obstructive sleep apnea.  04/25/2020 - > does not  meet criteria for SBT/Extubation in setting of Acute Respiratory Failure due to ards , nimbex, deep sseation. Off prone/supine cycle. S/p 2 cycles ending 04/24/20. Lasix helping. Down to 40% fio2. Peep 8   Plan - D4 nimbex gtt for  vent syncrhony -> stop and reasess -> can do PRN if needed - monitor with supine ARDS protocol - goal SpO2 88 to 95% - goal Vt 6 cc/kg - f/u CXR, ABG - - pulmicort, brovana, prn albutero - get echo for completion -continue t scheduled lasix based on FACTT trial data for negative balance  COVID-19 criticall ill infection, unvaccinated, immune suppressed baseline  plan - day 10 of solumedrol -  remedesivir per protocl - not candidate for tocilizumab or baricitinab since she has been on immunosuppressive therapy for Crohn's colitis  Acute metabolic encephalopathy 2nd to hypoxia, hypercapnia. Hx of depression.  04/25/2020 -  RASS -5 and BIS 30s on deep sedation and nimbex  Plan - RASS goal -4 to -3 (can liberalzie a bit) but needs vent synchrony -  versed gtt and continue fent gtt  - dc nimbex - hold outpt  trazodone  Hx of Crohn's colitis.  04/25/2020 - tolrateing TF at 20cc/.h  plan - hold outpt mercaptopurine, mesalamine - if she develops significant GI symptoms, them might need GI assessment >> followed by GI in [place] -   Hx of HTN - 8/24 - SBP very high yesterday and improved after bp meds  Plan  - cotinue cardizem and labetalol   DM type 2 poorly controlled with steroid induced hyperglycemia. - SSI with levemir and tradjenta  Electrolyte imbalance  04/25/2020 - nil acute  Plan  - replete K   - monitor in setting of lasix  Best practice:  Diet: tube feeds DVT prophylaxis: enoxaparin GI prophylaxis: protonix  Mobility: bed Code Status: full  Disposition: ICU  Family:   - 8/23 - Husband [name] [phone] - updated in detail and sister-law [name] (RT) x 2 - 8/24 - husband [name] updated and later sister in law [name] over phone - 8/25- husband [name] - 8/26 - husband [name]    ATTESTATION & SIGNATURE   [name] is critically ill with multiple organ systems failure and requires high complexity  decision making for assessment and support, frequent evaluation and titration of therapies, application of advanced monitoring technologies and extensive interpretation of multiple databases.   Critical Care Time devoted to patient care services described in this note is  40  Minutes. This time reflects time of care of this signee Dr Brand Males. This critical care time does not reflect procedure time, or teaching time or supervisory time of PA/NP/Med student/Med Resident etc but could involve care discussion time     Dr. Brand Males, M.D., [facility].C.P Pulmonary and Critical Care Medicine Staff Physician [place] Pager: [phone], If no answer or between  15:00h - 7:00h: call [phone]  04/25/2020 9:19 AM    LABS  PULMONARY Recent Labs  Lab 04/22/20 1700 04/22/20 2025 04/23/20 0439 04/23/20 1420 04/24/20 1519  PHART 7.280* 7.368 7.403 7.433 7.432  PCO2ART 71.1* 58.4* 53.6* 52.2* 51.7*  PO2ART 62.9* 60.9* 69.6* 53.6* 64.1*  HCO3 32.4* 33.0* 32.7* 34.3* 33.8*  O2SAT 90.6 92.5 94.0 89.1 92.3    CBC Recent Labs  Lab 04/23/20 0431 04/24/20 0440 04/25/20 0344  HGB 13.4 11.7* 12.2  HCT 41.3 36.7 37.9  WBC 14.8* 7.9 7.1  PLT 234 173 158    COAGULATION Recent Labs  Lab 04/23/20 0431 04/25/20 0344  INR 1.2 1.1    CARDIAC  No results for input(s): TROPONINI in the last 168 hours. No results for input(s): PROBNP in the last 168 hours.   CHEMISTRY Recent Labs  Lab 04/21/20 0533 04/21/20 0533 04/21/20 1134 04/21/20 1134 04/21/20 1630 04/22/20 0227 04/22/20 0227 04/23/20 0431 04/23/20 0431 04/24/20 0440 04/25/20 0344  NA 136  --   --   --   --  146*  --  143  --  145 147*  K 3.3*   < >  --   --   --  3.9   < > 3.9   < > 3.4* 4.2  CL 103  --   --   --   --  110  --  105  --  108 103  CO2 25  --   --   --   --  28  --  30  --  30 35*  GLUCOSE 187*  --   --   --   --  232*  --  174*  --  222*  258*  BUN 14  --   --   --   --  22*  --  26*  --  43* 46*  CREATININE 0.56  --   --   --   --  0.55  --  0.38*  --  0.61 0.60  CALCIUM 7.4*  --   --   --   --  8.3*  --  8.1*  --  7.6* 8.2*  MG  --   --  2.1  --  2.3 2.5*  --  2.5*  --   --  2.5*  PHOS  --   --  2.6   < > 2.4* 2.0*  --  2.1*  --  3.8 2.6   < > = values in this interval not displayed.   Estimated Creatinine Clearance: 104.4 mL/min (by C-G formula based on SCr of 0.6 mg/dL).   LIVER Recent Labs  Lab 04/21/20 0533 04/22/20 0227 04/23/20 0431 04/24/20 0440 04/25/20 0344  AST 37 28 17 13* 13*  ALT 36 33 31 23 21   ALKPHOS 95 91 80 63 69  BILITOT 0.9 0.7 0.7 1.0 0.8  PROT 5.1* 5.8* 5.8* 4.8* 5.4*  ALBUMIN 2.1* 2.2* 2.3* 1.9* 2.2*  INR  --   --  1.2  --  1.1     INFECTIOUS Recent Labs  Lab 04/25/20 0344  LATICACIDVEN 2.1*     ENDOCRINE CBG (last 3)  Recent Labs    04/24/20 2352 04/25/20 0337 04/25/20 0759  GLUCAP 222* 235* 228*         IMAGING x48h  - image(s) personally visualized  -   highlighted in bold DG Abd 1 View  Result Date: 04/23/2020 CLINICAL DATA:  Encounter for NG tube placement. EXAM: ABDOMEN - 1 VIEW COMPARISON:  Earlier today. FINDINGS: The weighted enteric tube tip is in  the right upper quadrant, in the region of the distal stomach/first portion of the duodenum. Nonobstructive bowel gas pattern. Surgical clips in the right upper quadrant from cholecystectomy. IMPRESSION: Tip of the weighted enteric tube in the distal stomach/first portion of the duodenum. Electronically Signed   By: Keith Rake M.D.   On: 04/23/2020 17:17   DG Abd 1 View  Result Date: 04/23/2020 CLINICAL DATA:  Feeding tube placement EXAM: ABDOMEN - 1 VIEW COMPARISON:  04/22/2019 FINDINGS: Feeding tube with the tip projecting over the stomach. No bowel dilatation to suggest obstruction. No evidence of pneumoperitoneum, portal venous gas or pneumatosis. No pathologic calcifications along the expected course of  the ureters. No acute osseous abnormality. IMPRESSION: Feeding tube with the tip projecting over the stomach. Electronically Signed   By: Kathreen Devoid   On: 04/23/2020 14:56   DG Chest Port 1 View  Result Date: 04/25/2020 CLINICAL DATA:  COVID 19. EXAM: PORTABLE CHEST 1 VIEW COMPARISON:  04/24/2020. FINDINGS: Endotracheal tube, feeding tube, right PICC line stable position. Heart size normal. Diffuse bilateral pulmonary infiltrates again noted without interim change. Interim improvement of right perihilar atelectasis. No pleural effusion or pneumothorax. IMPRESSION: 1.  Lines and tubes in stable position. 2. Diffuse bilateral pulmonary infiltrates again noted without interim change. Interim improvement of right perihilar atelectasis. Electronically Signed   By: Marcello Moores  Register   On: 04/25/2020 06:43   DG CHEST PORT 1 VIEW  Result Date: 04/24/2020 CLINICAL DATA:  54 year old female with history of adult respiratory distress syndrome. EXAM: PORTABLE CHEST 1 VIEW COMPARISON:  Chest x-ray 04/24/2020. FINDINGS: An endotracheal tube is in place with tip 3.7 cm above the carina. A feeding tube is seen extending into the abdomen, however, the tip of the feeding tube extends below the lower margin of the image. Lung volumes are normal. Patchy multifocal interstitial and ill-defined airspace opacities are noted throughout the lungs bilaterally, with slightly improved aeration compared to the prior examination. Trace left pleural effusion. No right pleural effusion. No definite evidence of pulmonary edema. Heart size is normal. Upper mediastinal contours are within normal limits. IMPRESSION: 1. Support apparatus, as above. 2. The appearance the chest is most suggestive of severe multilobar bilateral pneumonia, with slight improvement in aeration compared to the prior examination, as above. Electronically Signed   By: Vinnie Langton M.D.   On: 04/24/2020 15:26   DG CHEST PORT 1 VIEW  Result Date:  04/24/2020 CLINICAL DATA:  Hypoxia EXAM: PORTABLE CHEST 1 VIEW COMPARISON:  April 23, 2020 FINDINGS: Endotracheal tube tip is 2.4 cm above the carina. Feeding tube tip is below the diaphragm. Central catheter tip is in the superior vena cava. No pneumothorax. There is airspace opacity throughout the lungs bilaterally, similar to 1 day prior. No new opacity evident. Heart size and pulmonary vascularity are within normal limits. No evident adenopathy. No bone lesions. IMPRESSION: Tube and catheter positions as described without pneumothorax. Airspace opacity throughout the lungs bilaterally, stable. Stable cardiac silhouette. Electronically Signed   By: Lowella Grip III M.D.   On: 04/24/2020 08:10   DG Chest Port 1 View  Result Date: 04/23/2020 CLINICAL DATA:  Endotracheal tube placement, moving patient from prone to supine. EXAM: PORTABLE CHEST 1 VIEW COMPARISON:  Radiograph earlier this day. FINDINGS: Borderline low positioning of the endotracheal tube tip 13 mm from the carina. Enteric tube in place, tip included on concurrent abdominal radiographs. There is a right upper extremity PICC with tip in the mid SVC. Slight improvement in  heterogeneous bilateral lung opacities from earlier today. Stable heart size and mediastinal contours. No evidence of pneumothorax or large pleural effusion. No visualized pneumomediastinum. IMPRESSION: 1. Borderline low positioning of the endotracheal tube tip 13 mm from the carina. 2. Right upper extremity PICC with tip in the mid SVC. 3. Slight improvement in bilateral heterogeneous lung opacities from earlier today. Electronically Signed   By: Keith Rake M.D.   On: 04/23/2020 17:19   ECHOCARDIOGRAM COMPLETE  Result Date: 04/24/2020    ECHOCARDIOGRAM REPORT   Patient Name:   AUDRIS SPEAKER Date of Exam: 04/24/2020 Medical Rec #:  [id number]          Height:       63.0 in Accession #:    [id number]         Weight:       276.9 lb Date of Birth:  10/03/1965          BSA:          2.220 m Patient Age:    86 years           BP:           148/55 mmHg Patient Gender: F                  HR:           81 bpm. Exam Location:  Inpatient Procedure: 2D Echo Indications:    acute respiratory insufficiency 518.82  History:        Patient has no prior history of Echocardiogram examinations.                 Risk Factors:Diabetes, Dyslipidemia and Hypertension. Covid +.  Sonographer:    Jannett Celestine RDCS (AE) Referring Phys: [id number] Aaralyn Kil  Sonographer Comments: Echo performed with patient supine and on artificial respirator. Image acquisition challenging due to patient body habitus and Image acquisition challenging due to respiratory motion. IMPRESSIONS  1. Left ventricular ejection fraction, by estimation, is 55 to 60%. The left ventricle has normal function. Left ventricular endocardial border not optimally defined to evaluate regional wall motion. Left ventricular diastolic parameters are consistent with Grade I diastolic dysfunction (impaired relaxation).  2. Right ventricular systolic function is normal. The right ventricular size is normal. Tricuspid regurgitation signal is inadequate for assessing PA pressure.  3. The mitral valve is grossly normal. No evidence of mitral valve regurgitation. No evidence of mitral stenosis.  4. The aortic valve is grossly normal. Aortic valve regurgitation is not visualized. No aortic stenosis is present. FINDINGS  Left Ventricle: Left ventricular ejection fraction, by estimation, is 55 to 60%. The left ventricle has normal function. Left ventricular endocardial border not optimally defined to evaluate regional wall motion. The left ventricular internal cavity size was normal in size. There is no left ventricular hypertrophy. Left ventricular diastolic parameters are consistent with Grade I diastolic dysfunction (impaired relaxation). Right Ventricle: The right ventricular size is normal. No increase in right ventricular wall thickness. Right  ventricular systolic function is normal. Tricuspid regurgitation signal is inadequate for assessing PA pressure. Left Atrium: Left atrial size was normal in size. Right Atrium: Right atrial size was normal in size. Pericardium: Trivial pericardial effusion is present. Presence of pericardial fat pad. Mitral Valve: The mitral valve is grossly normal. There is mild calcification of the anterior mitral valve leaflet(s). Mild mitral annular calcification. No evidence of mitral valve regurgitation. No evidence of mitral valve stenosis. Tricuspid Valve: The tricuspid valve is grossly  normal. Tricuspid valve regurgitation is trivial. No evidence of tricuspid stenosis. Aortic Valve: The aortic valve is grossly normal. Aortic valve regurgitation is not visualized. No aortic stenosis is present. Pulmonic Valve: The pulmonic valve was grossly normal. Pulmonic valve regurgitation is not visualized. No evidence of pulmonic stenosis. Aorta: The aortic root is normal in size and structure. Venous: IVC assessment for right atrial pressure unable to be performed due to mechanical ventilation. IAS/Shunts: The atrial septum is grossly normal.  LEFT VENTRICLE PLAX 2D LVIDd:         3.90 cm  Diastology LVIDs:         2.40 cm  LV e' lateral:   8.16 cm/s LV PW:         1.00 cm  LV E/e' lateral: 9.0 LV IVS:        1.00 cm  LV e' medial:    6.74 cm/s LVOT diam:     2.00 cm  LV E/e' medial:  10.9 LV SV:         58 LV SV Index:   26 LVOT Area:     3.14 cm  LEFT ATRIUM           Index LA diam:      2.90 cm 1.31 cm/m LA Vol (A4C): 18.1 ml 8.15 ml/m  AORTIC VALVE LVOT Vmax:   80.30 cm/s LVOT Vmean:  52.900 cm/s LVOT VTI:    0.186 m  AORTA Ao Root diam: 2.90 cm MITRAL VALVE MV Area (PHT): 2.42 cm    SHUNTS MV Decel Time: 313 msec    Systemic VTI:  0.19 m MV E velocity: 73.30 cm/s  Systemic Diam: 2.00 cm MV A velocity: 75.40 cm/s MV E/A ratio:  0.97 Eleonore Chiquito MD Electronically signed by Eleonore Chiquito MD Signature Date/Time:  04/24/2020/4:23:44 PM    Final

## 2020-04-25 NOTE — Progress Notes (Addendum)
Inpatient Diabetes Program Recommendations  AACE/ADA: New Consensus Statement on Inpatient Glycemic Control (2015)  Target Ranges:  Prepandial:   less than 140 mg/dL      Peak postprandial:   less than 180 mg/dL (1-2 hours)      Critically ill patients:  140 - 180 mg/dL   Lab Results  Component Value Date   GLUCAP 228 (H) 04/25/2020   HGBA1C 6.1 (H) 11/08/2019    Review of Glycemic Control Results for [name], [name] (MRN [id number]) as of 04/25/2020 10:34  Ref. Range 04/24/2020 08:10 04/24/2020 11:48 04/24/2020 16:20 04/24/2020 19:46 04/24/2020 23:52 04/25/2020 03:37 04/25/2020 07:59  Glucose-Capillary Latest Ref Range: 70 - 99 mg/dL 226 (H) 211 (H) 222 (H) 245 (H) 222 (H) 235 (H) 228 (H)  Diabetes history: DM 2 Outpatient Diabetes medications: Jardiance 25 mg Daily, unsure of insulin doses prior to admission Current orders for Inpatient glycemic control:  Levemir 19 units bid Novolog 0-20 units Q4 hours Tradjenta 5 mg Daily  Solumedrol 63.75 mg Q12 hours Vital AF  20 ml/hour  Inpatient Diabetes Program Recommendations:    - add Novolog 5 units Q4 hours Tube Feed Coverage.  Thanks,  Tama Headings RN, MSN, BC-ADM Inpatient Diabetes Coordinator Team Pager [phone] (8a-5p)

## 2020-04-25 NOTE — Progress Notes (Signed)
CRITICAL VALUE ALERT  Critical Value:  Lactic 2.1  Date & Time Notied:  04/25/20 @ 04:30  Provider Notified: Yes Elink  Orders Received/Actions taken: Waiting for orders at this time

## 2020-04-25 NOTE — TOC Progression Note (Signed)
Transition of Care [facility]) - Progression Note    Patient Details  Name: [name] MRN: [id number] Date of Birth: 1965-09-07  Transition of Care [facility]) CM/SW Contact  Leeroy Cha, RN Phone Number: 04/25/2020, 8:24 AM  Clinical Narrative:    Transfer from [facility] due to +covid and resp failure, intubated and transferred to [facility], vent at 50%, iv solu medrol, iv sedation, remdesivir through 082721/hx of immunosuppressed and on 6-mp due to crohn's dz. Remains on vent at 40% fi02, iv paralytic, iv sedation, bld cultures of antecubital and port a cath neg. Iv solumedrol. Following for progression and toc needs.  Ay need ltach placement if vent time prolonged.  Expected Discharge Plan: [place] (from [place]) Barriers to Discharge: Continued Medical Work up  Expected Discharge Plan and Services Expected Discharge Plan: [place] (from [place])   Discharge Planning Services: CM Consult   Living arrangements for the past 2 months: [place]                                       Social Determinants of Health (SDOH) Interventions    Readmission Risk Interventions No flowsheet data found.

## 2020-04-26 ENCOUNTER — Inpatient Hospital Stay (HOSPITAL_COMMUNITY): Payer: BC Managed Care – PPO

## 2020-04-26 LAB — COMPREHENSIVE METABOLIC PANEL
ALT: 26 U/L (ref 0–44)
AST: 20 U/L (ref 15–41)
Albumin: 2.3 g/dL — ABNORMAL LOW (ref 3.5–5.0)
Alkaline Phosphatase: 67 U/L (ref 38–126)
Anion gap: 10 (ref 5–15)
BUN: 44 mg/dL — ABNORMAL HIGH (ref 6–20)
CO2: 41 mmol/L — ABNORMAL HIGH (ref 22–32)
Calcium: 8.1 mg/dL — ABNORMAL LOW (ref 8.9–10.3)
Chloride: 100 mmol/L (ref 98–111)
Creatinine, Ser: 0.62 mg/dL (ref 0.44–1.00)
GFR calc Af Amer: >60 mL/min (ref >60)
GFR calc non Af Amer: >60 mL/min (ref >60)
Glucose, Bld: 273 mg/dL — ABNORMAL HIGH (ref 70–99)
Potassium: 4.2 mmol/L (ref 3.5–5.1)
Sodium: 151 mmol/L — ABNORMAL HIGH (ref 135–145)
Total Bilirubin: 0.7 mg/dL (ref 0.3–1.2)
Total Protein: 5.7 g/dL — ABNORMAL LOW (ref 6.5–8.1)

## 2020-04-26 LAB — CULTURE, BLOOD (ROUTINE X 2)
Culture: NO GROWTH
Culture: NO GROWTH
Special Requests: ADEQUATE
Special Requests: ADEQUATE

## 2020-04-26 LAB — GLUCOSE, CAPILLARY
Glucose-Capillary: 198 mg/dL — ABNORMAL HIGH (ref 70–99)
Glucose-Capillary: 255 mg/dL — ABNORMAL HIGH (ref 70–99)
Glucose-Capillary: 236 mg/dL — ABNORMAL HIGH (ref 70–99)
Glucose-Capillary: 254 mg/dL — ABNORMAL HIGH (ref 70–99)
Glucose-Capillary: 274 mg/dL — ABNORMAL HIGH (ref 70–99)

## 2020-04-26 LAB — CBC
HCT: 38.3 % (ref 36.0–46.0)
Hemoglobin: 12.1 g/dL (ref 12.0–15.0)
MCH: 33.3 pg (ref 26.0–34.0)
MCHC: 31.6 g/dL (ref 30.0–36.0)
MCV: 105.5 fL — ABNORMAL HIGH (ref 80.0–100.0)
Platelets: 147 10*3/uL — ABNORMAL LOW (ref 150–400)
RBC: 3.63 MIL/uL — ABNORMAL LOW (ref 3.87–5.11)
RDW: 12.2 % (ref 11.5–15.5)
WBC: 7.5 10*3/uL (ref 4.0–10.5)
nRBC: 0 % (ref 0.0–0.2)

## 2020-04-26 LAB — CK TOTAL AND CKMB (NOT AT ARMC)
CK, MB: 0.4 ng/mL — ABNORMAL LOW (ref 0.5–5.0)
Relative Index: INVALID (ref 0.0–2.5)
Total CK: 72 U/L (ref 38–234)

## 2020-04-26 LAB — PHOSPHORUS: Phosphorus: 2.4 mg/dL — ABNORMAL LOW (ref 2.5–4.6)

## 2020-04-26 LAB — MAGNESIUM: Magnesium: 2.6 mg/dL — ABNORMAL HIGH (ref 1.7–2.4)

## 2020-04-26 LAB — CK: Total CK: 66 U/L (ref 38–234)

## 2020-04-26 LAB — LACTIC ACID, PLASMA: Lactic Acid, Venous: 2.4 mmol/L (ref 0.5–1.9)

## 2020-04-26 MED ORDER — SODIUM PHOSPHATES 45 MMOLE/15ML IV SOLN
10.0000 mmol | Freq: Once | INTRAVENOUS | Status: AC
  Administered 2020-04-26: 10 mmol via INTRAVENOUS
  Filled 2020-04-26: qty 3.33

## 2020-04-26 MED ORDER — ADULT MULTIVITAMIN LIQUID CH
15.0000 mL | Freq: Every day | ORAL | Status: DC
  Administered 2020-04-27 – 2020-05-13 (×17): 15 mL
  Filled 2020-04-26 (×17): qty 15

## 2020-04-26 MED ORDER — LABETALOL HCL 5 MG/ML IV SOLN
10.0000 mg | Freq: Four times a day (QID) | INTRAVENOUS | Status: DC
  Administered 2020-04-26 – 2020-05-05 (×34): 10 mg via INTRAVENOUS
  Filled 2020-04-26 (×29): qty 4

## 2020-04-26 MED ORDER — SODIUM CHLORIDE 0.45 % IV SOLN: INTRAVENOUS | Status: DC

## 2020-04-26 MED ORDER — LACTATED RINGERS IV BOLUS
1000.0000 mL | Freq: Once | INTRAVENOUS | Status: AC
  Administered 2020-04-26: 1000 mL via INTRAVENOUS

## 2020-04-26 NOTE — Progress Notes (Signed)
Inpatient Diabetes Program Recommendations  AACE/ADA: New Consensus Statement on Inpatient Glycemic Control (2015)  Target Ranges:  Prepandial:   less than 140 mg/dL      Peak postprandial:   less than 180 mg/dL (1-2 hours)      Critically ill patients:  140 - 180 mg/dL   Lab Results  Component Value Date   GLUCAP 255 (H) 04/26/2020   HGBA1C 6.1 (H) 11/08/2019    Review of Glycemic Control Results for CHANTELLA, CREECH (MRN [id number]) as of 04/26/2020 09:47  Ref. Range 04/25/2020 07:59 04/25/2020 11:44 04/25/2020 16:41 04/25/2020 20:03 04/25/2020 23:43 04/26/2020 03:59 04/26/2020 08:41  Glucose-Capillary Latest Ref Range: 70 - 99 mg/dL 228 (H) 304 (H) 233 (H) 235 (H) 265 (H) 198 (H) 255 (H)   Diabetes history: DM 2 Outpatient Diabetes medications: Jardiance 25 mg Daily, unsure of insulin doses prior to admission Current orders for Inpatient glycemic control:  Levemir 19 units bid Novolog 0-20 units Q4 hours Novolog 5 units Q4 tube feed coverage Tradjenta 5 mg Daily  Solumedrol 63.75 mg Q12 hours Vital AF  20 ml/hour  Inpatient Diabetes Program Recommendations:    -  Increase Levemir 28 units bid  Thanks,  Tama Headings RN, MSN, BC-ADM Inpatient Diabetes Coordinator Team Pager [phone] (8a-5p)

## 2020-04-26 NOTE — Progress Notes (Signed)
NAME:  [name], MRN:  [id number], DOB:  1965-11-05, LOS: 6 ADMISSION DATE:  04/20/2020, CONSULTATION DATE:  04/21/20 REFERRING MD:  , CHIEF COMPLAINT: short of breath  Brief History   54 yo female tested positive for COVID 19 on 8/09.  Had progressive symptoms and admitted to [facility] on 8/17.  Treated with remdesivir, prednisone and ABx for CAP.  She developed worsening hypoxia and required intubation.  She was transferred from [facility] with VDRF from [place] 19 pneumonia with ARDS.  Past Medical History  Crohn's colitis, Carpal tunnel, Osteoarthritis, DM type 2, Asthma, HTN, HLD, OSA, Morbid obesity with BMI 49.64, Depression  Significant Hospital Events   8/22 transfer from [place] on full vent support, sedation. 8/23 - started Nimbex and  cycle # prone 16h/supine 8h in pm - ARDS protocol 8/24 - - still prone. fio2 down  To 40%. Deeply sedated and paralyzed. Not on pressors. Making  Urine. CXR with severe ARDS. BIS 30s. PF ratio improved to 175 while prone 8/26>> Proning d/c'd, Nimbex d/c'd 8/27>> 40%, PEEP weaned to 5, Rate 35, BP elevated, 275 mcg Fentanyl, 4 mg Versed   Consults:    Procedures:  ETT 8/21 >>  RUE PICC ? date  Significant Diagnostic Tests:  Echo 04/24/2020 1. Left ventricular ejection fraction, by estimation, is 55 to 60%. The left ventricle has normal function. Left ventricular endocardial border not optimally defined to evaluate regional wall motion. Left ventricular diastolic parameters are consistent with Grade I diastolic dysfunction (impaired relaxation). 2. Right ventricular systolic function is normal. The right ventricular size is normal. Tricuspid regurgitation signal is inadequate for assessing PA pressure. 3. The mitral valve is grossly normal. No evidence of mitral valve regurgitation. No evidence of mitral stenosis. 4. The aortic valve is grossly normal. Aortic valve regurgitation is not visualized. No  aortic stenosis is present  Micro Data:  COVID 8/09 >> positive COVID 8/17 >> positive MRSA PCR 8/22 >> Blood 8/22 >>No growth Sputum 8/22 >>Normal flora Urine 8/23 - GPC  Antimicrobials:  Remdesivir 8/17 >> Steroids 8/17 >> (10 days) No Immune modulator eg Acterma (hx of crohns) xxxxxx  Antibiotics Given (last 72 hours)    None        Interim history/subjective:   04/26/2020 - cotninues vent, 40%, peep 5.  nimbix discontinues 8/26, fent gtt, versed gtt. -> BIS. Prone d/c'd - currently supine,  P/F 160,   Making urine. No pressors, Hypertensive today again  has  needed scheduled cardizem and labetalol to achieve control. T max 99.5. Urine with sediments - growing GPC Tube feeds have been advanced to goal Will start careful SBT's 8/28 per Dr. Chase Caller Net negative 7.3 L     Objective   Blood pressure (!) 154/52, pulse 72, temperature 99.7 F (37.6 C), resp. rate (!) 35, height 5' 3"  (1.6 m), weight 122.3 kg, SpO2 90 %.    Vent Mode: PRVC FiO2 (%):  [40 %] 40 % Set Rate:  [35 bmp] 35 bmp Vt Set:  [320 mL] 320 mL PEEP:  [5 cmH20-8 cmH20] 5 cmH20 Plateau Pressure:  [20 cmH20-23 cmH20] 20 cmH20   Intake/Output Summary (Last 24 hours) at 04/26/2020 1445 Last data filed at 04/26/2020 1323 Gross per 24 hour  Intake 3387.78 ml  Output 4225 ml  Net -837.22 ml   Filed Weights   04/24/20 0600 04/25/20 0500 04/26/20 0453  Weight: 125.6 kg 124.6 kg 122.3 kg    Examination: General Appearance: Critically ill,  obese, on mechanical ventilation Head:  Normocephalic, without obvious abnormality, atraumatic, ETT secure and intact, NG tube secure and intact Eyes:  PERRL - cannot examined, conjunctiva/corneas - cannot examine     Ears:  Normal external ear canals, both ears Nose:  G tube - yes Throat:  ETT TUBE - yes , OG tube - no NG tube - yes Neck:  Supple,  No enlargement/tenderness/nodules Lungs: Bilateral chest excursion, Clear with few rhonchi  to auscultation  bilaterally, Ventilator   Synchrony - yes Heart:  S1 and S2 normal, RRR, no murmur, CVP - x.  Pressors - no Abdomen:  Soft, no masses, no organomegaly, ND Genitalia / Rectal:  Foley cath Extremities:  No obvious deformities, brisk capillary refill Skin:  intact in exposed areas .Warm and dry. No lesions or rash noted  Neurologic:  Sedation - deep sedatin , no paralytic -> RASS - 3         Resolved Hospital Problem list   High TGL - holding diprivan  Assessment & Plan:   Acute hypoxic/hypercapnic respiratory failure with ARDS from COVID 19 pneumonia. Hx of asthma, obstructive sleep apnea.  04/26/2020 - > does not meet criteria for/Extubation in setting of Acute Respiratory Failure due to COVID ARDS Will start careful SBT 8/28 am  Plan - Fentanyl and versed for vent synchrony - goal SpO2 88 to 95% - goal Vt 6 cc/kg - f/u CXR, ABG - - pulmicort, brovana, prn albutero -- Echo results as noted above - continued  scheduled lasix based on FACTT trial data for negative balance as Na and renal function allow  COVID-19 criticall ill infection, unvaccinated, immune suppressed baseline  plan - day11 of solumedrol -  remedesivir per protocl - not candidate for tocilizumab or baricitinab since she has been on immunosuppressive therapy for Crohn's colitis  Acute metabolic encephalopathy 2nd to hypoxia, hypercapnia. Hx of depression.  04/26/2020 -  RASS -3 and  on deep sedation Liberated from nimbex 8/26   Plan - RASS goal -3  -  versed gtt and continue fent gtt  - hold outpt  trazodone  Hx of Crohn's colitis.  04/26/2020 - tolrateing TF at 20cc/.h  plan - hold outpt mercaptopurine, mesalamine - if she develops significant GI symptoms, them might need GI assessment >> followed by GI in [place] feeding tube, post pyloric  Hx of HTN. - 8/27 - SBP remains elevated today    Plan  - continue scheduled  cardizem  - Will add scheduled Labetalol in addition to  scheduled Cardizem and prn labetalol   DM type 2 poorly controlled with steroid induced hyperglycemia. - SSI with levemir and tradjenta  Electrolyte imbalance  04/26/2020 - low K  Plan - Trend BMET  - replete electrolytes prn   - monitor Na and renal function  in setting of lasix therapy  Best practice:  Diet: tube feeds DVT prophylaxis: enoxaparin GI prophylaxis: protonix  Mobility: bed Code Status: full  Disposition: ICU  Family:   - 8/23 - Husband [name] [phone] - updated in detail and sister-law Sharyn Lull (RT) x 2 - 8/24 - husband [name] updated and later sister in law Sharyn Lull over phone 8/25- husband [name]  Husband has been updated by nursing staff 8/28  Critical Care APP time 70 minutes  [place] F. Aretha Levi, MSN, AGACNP-BC [place] for personal pager PCCM on call pager [phone] 04/26/2020 2:45 PM  LABS    PULMONARY Recent Labs  Lab 04/22/20 1700 04/22/20 2025 04/23/20 0439 04/23/20 1420 04/24/20 1519  PHART 7.280* 7.368 7.403 7.433 7.432  PCO2ART 71.1* 58.4* 53.6* 52.2* 51.7*  PO2ART 62.9* 60.9* 69.6* 53.6* 64.1*  HCO3 32.4* 33.0* 32.7* 34.3* 33.8*  O2SAT 90.6 92.5 94.0 89.1 92.3    CBC Recent Labs  Lab 04/24/20 0440 04/25/20 0344 04/26/20 0428  HGB 11.7* 12.2 12.1  HCT 36.7 37.9 38.3  WBC 7.9 7.1 7.5  PLT 173 158 147*    COAGULATION Recent Labs  Lab 04/23/20 0431 04/25/20 0344  INR 1.2 1.1    CARDIAC  No results for input(s): TROPONINI in the last 168 hours. No results for input(s): PROBNP in the last 168 hours.   CHEMISTRY Recent Labs  Lab 04/21/20 0533 04/21/20 1630 04/22/20 0227 04/22/20 0227 04/23/20 0431 04/23/20 0431 04/24/20 0440 04/24/20 0440 04/25/20 0344 04/26/20 0428  NA   < >  --  146*  --  143  --  145  --  147* 151*  K   < >  --  3.9   < > 3.9   < > 3.4*   < > 4.2 4.2  CL   < >  --  110  --  105  --  108   --  103 100  CO2   < >  --  28  --  30  --  30  --  35* 41*  GLUCOSE   < >  --  232*  --  174*  --  222*  --  258* 273*  BUN   < >  --  22*  --  26*  --  43*  --  46* 44*  CREATININE   < >  --  0.55  --  0.38*  --  0.61  --  0.60 0.62  CALCIUM   < >  --  8.3*  --  8.1*  --  7.6*  --  8.2* 8.1*  MG  --  2.3 2.5*  --  2.5*  --   --   --  2.5* 2.6*  PHOS   < > 2.4* 2.0*  --  2.1*  --  3.8  --  2.6 2.4*   < > = values in this interval not displayed.   Estimated Creatinine Clearance: 103.2 mL/min (by C-G formula based on SCr of 0.62 mg/dL).   LIVER Recent Labs  Lab 04/22/20 0227 04/23/20 0431 04/24/20 0440 04/25/20 0344 04/26/20 0428  AST 28 17 13* 13* 20  ALT 33 31 23 21 26   ALKPHOS 91 80 63 69 67  BILITOT 0.7 0.7 1.0 0.8 0.7  PROT 5.8* 5.8* 4.8* 5.4* 5.7*  ALBUMIN 2.2* 2.3* 1.9* 2.2* 2.3*  INR  --  1.2  --  1.1  --      INFECTIOUS Recent Labs  Lab 04/25/20 0344 04/26/20 0500  LATICACIDVEN 2.1* 2.4*     ENDOCRINE CBG (last 3)  Recent Labs    04/26/20 0359 04/26/20 0841 04/26/20 1131  GLUCAP 198* 255* 236*         IMAGING x48h  - image(s) personally visualized  -   highlighted in bold DG CHEST PORT 1 VIEW  Result Date: 04/26/2020 CLINICAL DATA:  COVID. EXAM: PORTABLE CHEST 1 VIEW COMPARISON:  04/25/2020. FINDINGS: Endotracheal tube, feeding tube, right PICC line in stable position. Heart size stable. Diffuse bilateral pulmonary infiltrates again noted. Similar findings on prior exam. No pleural effusion or  pneumothorax. IMPRESSION: 1.  Lines and tubes in stable position. 2. Diffuse bilateral pulmonary infiltrates again noted without interim change. Electronically Signed   By: Marcello Moores  Register   On: 04/26/2020 06:37   DG Chest Port 1 View  Result Date: 04/25/2020 CLINICAL DATA:  COVID 19. EXAM: PORTABLE CHEST 1 VIEW COMPARISON:  04/24/2020. FINDINGS: Endotracheal tube, feeding tube, right PICC line stable position. Heart size normal. Diffuse bilateral pulmonary  infiltrates again noted without interim change. Interim improvement of right perihilar atelectasis. No pleural effusion or pneumothorax. IMPRESSION: 1.  Lines and tubes in stable position. 2. Diffuse bilateral pulmonary infiltrates again noted without interim change. Interim improvement of right perihilar atelectasis. Electronically Signed   By: Marcello Moores  Register   On: 04/25/2020 06:43   DG CHEST PORT 1 VIEW  Result Date: 04/24/2020 CLINICAL DATA:  54 year old female with history of adult respiratory distress syndrome. EXAM: PORTABLE CHEST 1 VIEW COMPARISON:  Chest x-ray 04/24/2020. FINDINGS: An endotracheal tube is in place with tip 3.7 cm above the carina. A feeding tube is seen extending into the abdomen, however, the tip of the feeding tube extends below the lower margin of the image. Lung volumes are normal. Patchy multifocal interstitial and ill-defined airspace opacities are noted throughout the lungs bilaterally, with slightly improved aeration compared to the prior examination. Trace left pleural effusion. No right pleural effusion. No definite evidence of pulmonary edema. Heart size is normal. Upper mediastinal contours are within normal limits. IMPRESSION: 1. Support apparatus, as above. 2. The appearance the chest is most suggestive of severe multilobar bilateral pneumonia, with slight improvement in aeration compared to the prior examination, as above. Electronically Signed   By: Vinnie Langton M.D.   On: 04/24/2020 15:26   ECHOCARDIOGRAM COMPLETE  Result Date: 04/24/2020    ECHOCARDIOGRAM REPORT   Patient Name:   BLESSIN KANNO Date of Exam: 04/24/2020 Medical Rec #:  [id number]          Height:       63.0 in Accession #:    [id number]         Weight:       276.9 lb Date of Birth:  06/07/1966         BSA:          2.220 m Patient Age:    22 years           BP:           148/55 mmHg Patient Gender: F                  HR:           81 bpm. Exam Location:  Inpatient Procedure: 2D Echo  Indications:    acute respiratory insufficiency 518.82  History:        Patient has no prior history of Echocardiogram examinations.                 Risk Factors:Diabetes, Dyslipidemia and Hypertension. Covid +.  Sonographer:    Jannett Celestine RDCS (AE) Referring Phys: [id number] MURALI RAMASWAMY  Sonographer Comments: Echo performed with patient supine and on artificial respirator. Image acquisition challenging due to patient body habitus and Image acquisition challenging due to respiratory motion. IMPRESSIONS  1. Left ventricular ejection fraction, by estimation, is 55 to 60%. The left ventricle has normal function. Left ventricular endocardial border not optimally defined to evaluate regional wall motion. Left ventricular diastolic parameters are consistent with Grade I diastolic dysfunction (impaired relaxation).  2.  Right ventricular systolic function is normal. The right ventricular size is normal. Tricuspid regurgitation signal is inadequate for assessing PA pressure.  3. The mitral valve is grossly normal. No evidence of mitral valve regurgitation. No evidence of mitral stenosis.  4. The aortic valve is grossly normal. Aortic valve regurgitation is not visualized. No aortic stenosis is present. FINDINGS  Left Ventricle: Left ventricular ejection fraction, by estimation, is 55 to 60%. The left ventricle has normal function. Left ventricular endocardial border not optimally defined to evaluate regional wall motion. The left ventricular internal cavity size was normal in size. There is no left ventricular hypertrophy. Left ventricular diastolic parameters are consistent with Grade I diastolic dysfunction (impaired relaxation). Right Ventricle: The right ventricular size is normal. No increase in right ventricular wall thickness. Right ventricular systolic function is normal. Tricuspid regurgitation signal is inadequate for assessing PA pressure. Left Atrium: Left atrial size was normal in size. Right Atrium: Right  atrial size was normal in size. Pericardium: Trivial pericardial effusion is present. Presence of pericardial fat pad. Mitral Valve: The mitral valve is grossly normal. There is mild calcification of the anterior mitral valve leaflet(s). Mild mitral annular calcification. No evidence of mitral valve regurgitation. No evidence of mitral valve stenosis. Tricuspid Valve: The tricuspid valve is grossly normal. Tricuspid valve regurgitation is trivial. No evidence of tricuspid stenosis. Aortic Valve: The aortic valve is grossly normal. Aortic valve regurgitation is not visualized. No aortic stenosis is present. Pulmonic Valve: The pulmonic valve was grossly normal. Pulmonic valve regurgitation is not visualized. No evidence of pulmonic stenosis. Aorta: The aortic root is normal in size and structure. Venous: IVC assessment for right atrial pressure unable to be performed due to mechanical ventilation. IAS/Shunts: The atrial septum is grossly normal.  LEFT VENTRICLE PLAX 2D LVIDd:         3.90 cm  Diastology LVIDs:         2.40 cm  LV e' lateral:   8.16 cm/s LV PW:         1.00 cm  LV E/e' lateral: 9.0 LV IVS:        1.00 cm  LV e' medial:    6.74 cm/s LVOT diam:     2.00 cm  LV E/e' medial:  10.9 LV SV:         58 LV SV Index:   26 LVOT Area:     3.14 cm  LEFT ATRIUM           Index LA diam:      2.90 cm 1.31 cm/m LA Vol (A4C): 18.1 ml 8.15 ml/m  AORTIC VALVE LVOT Vmax:   80.30 cm/s LVOT Vmean:  52.900 cm/s LVOT VTI:    0.186 m  AORTA Ao Root diam: 2.90 cm MITRAL VALVE MV Area (PHT): 2.42 cm    SHUNTS MV Decel Time: 313 msec    Systemic VTI:  0.19 m MV E velocity: 73.30 cm/s  Systemic Diam: 2.00 cm MV A velocity: 75.40 cm/s MV E/A ratio:  0.97 Eleonore Chiquito MD Electronically signed by Eleonore Chiquito MD Signature Date/Time: 04/24/2020/4:23:44 PM    Final

## 2020-04-26 NOTE — Progress Notes (Signed)
eLink Physician-Brief Progress Note Patient Name: [name] DOB: 1966/01/04 MRN: [id number]   Date of Service  04/26/2020  HPI/Events of Note  Multiple issues: 1. Na+ = 151, 2. PO4--- = 2.4 and 3. Lactic Acid = 2.4.   eICU Interventions  Plan: 1. Bolus with LR 1 liter IV over 1 hour now. 2. 0.45 NaCl to run IV at 75 mL/hour. 3. Replace PO4---.      Intervention Category Major Interventions: Electrolyte abnormality - evaluation and management;Acid-Base disturbance - evaluation and management  Ofilia Rayon Eugene 04/26/2020, 5:48 AM

## 2020-04-27 ENCOUNTER — Inpatient Hospital Stay (HOSPITAL_COMMUNITY): Payer: BC Managed Care – PPO

## 2020-04-27 LAB — BLOOD GAS, ARTERIAL
Acid-Base Excess: 18.6 mmol/L — ABNORMAL HIGH (ref 0.0–2.0)
Bicarbonate: 45.8 mmol/L — ABNORMAL HIGH (ref 20.0–28.0)
Drawn by: 11249
FIO2: 60
MECHVT: 320 mL
O2 Saturation: 93.5 %
PEEP: 5 cmH2O
Patient temperature: 98.6
RATE: 35 resp/min
pCO2 arterial: 62.1 mmHg — ABNORMAL HIGH (ref 32.0–48.0)
pH, Arterial: 7.481 — ABNORMAL HIGH (ref 7.350–7.450)
pO2, Arterial: 67.4 mmHg — ABNORMAL LOW (ref 83.0–108.0)

## 2020-04-27 LAB — COMPREHENSIVE METABOLIC PANEL
ALT: 40 U/L (ref 0–44)
AST: 29 U/L (ref 15–41)
Albumin: 2.1 g/dL — ABNORMAL LOW (ref 3.5–5.0)
Alkaline Phosphatase: 59 U/L (ref 38–126)
Anion gap: 10 (ref 5–15)
BUN: 35 mg/dL — ABNORMAL HIGH (ref 6–20)
CO2: 39 mmol/L — ABNORMAL HIGH (ref 22–32)
Calcium: 7.1 mg/dL — ABNORMAL LOW (ref 8.9–10.3)
Chloride: 94 mmol/L — ABNORMAL LOW (ref 98–111)
Creatinine, Ser: 0.48 mg/dL (ref 0.44–1.00)
GFR calc Af Amer: >60 mL/min (ref >60)
GFR calc non Af Amer: >60 mL/min (ref >60)
Glucose, Bld: 215 mg/dL — ABNORMAL HIGH (ref 70–99)
Potassium: 3.7 mmol/L (ref 3.5–5.1)
Sodium: 143 mmol/L (ref 135–145)
Total Bilirubin: 1.1 mg/dL (ref 0.3–1.2)
Total Protein: 5 g/dL — ABNORMAL LOW (ref 6.5–8.1)

## 2020-04-27 LAB — CBC
HCT: 35.6 % — ABNORMAL LOW (ref 36.0–46.0)
Hemoglobin: 11.4 g/dL — ABNORMAL LOW (ref 12.0–15.0)
MCH: 33.5 pg (ref 26.0–34.0)
MCHC: 32 g/dL (ref 30.0–36.0)
MCV: 104.7 fL — ABNORMAL HIGH (ref 80.0–100.0)
Platelets: 144 10*3/uL — ABNORMAL LOW (ref 150–400)
RBC: 3.4 MIL/uL — ABNORMAL LOW (ref 3.87–5.11)
RDW: 11.9 % (ref 11.5–15.5)
WBC: 7.6 10*3/uL (ref 4.0–10.5)
nRBC: 0 % (ref 0.0–0.2)

## 2020-04-27 LAB — MAGNESIUM: Magnesium: 2.2 mg/dL (ref 1.7–2.4)

## 2020-04-27 LAB — CK: Total CK: 67 U/L (ref 38–234)

## 2020-04-27 LAB — GLUCOSE, CAPILLARY
Glucose-Capillary: 188 mg/dL — ABNORMAL HIGH (ref 70–99)
Glucose-Capillary: 193 mg/dL — ABNORMAL HIGH (ref 70–99)
Glucose-Capillary: 222 mg/dL — ABNORMAL HIGH (ref 70–99)
Glucose-Capillary: 262 mg/dL — ABNORMAL HIGH (ref 70–99)
Glucose-Capillary: 273 mg/dL — ABNORMAL HIGH (ref 70–99)
Glucose-Capillary: 280 mg/dL — ABNORMAL HIGH (ref 70–99)

## 2020-04-27 LAB — LACTIC ACID, PLASMA: Lactic Acid, Venous: 2.1 mmol/L (ref 0.5–1.9)

## 2020-04-27 MED ORDER — POTASSIUM CHLORIDE 20 MEQ/15ML (10%) PO SOLN
40.0000 meq | Freq: Once | ORAL | Status: AC
  Administered 2020-04-27: 40 meq
  Filled 2020-04-27: qty 30

## 2020-04-27 NOTE — Progress Notes (Addendum)
Goal is 88-95%. PER CCM note

## 2020-04-27 NOTE — Progress Notes (Signed)
NAME:  [name], MRN:  [id number], DOB:  08/15/1966, LOS: 7 ADMISSION DATE:  04/20/2020, CONSULTATION DATE:  04/21/20 REFERRING MD:  , CHIEF COMPLAINT: short of breath  Brief History   54 yo female tested positive for COVID 19 on 8/09.  Had progressive symptoms and admitted to [facility] on 8/17.  Treated with remdesivir, prednisone and ABx for CAP.  She developed worsening hypoxia and required intubation.  She was transferred from [facility] with VDRF from [place] 19 pneumonia with ARDS.  Past Medical History  Crohn's colitis, Carpal tunnel, Osteoarthritis, DM type 2, Asthma, HTN, HLD, OSA, Morbid obesity with BMI 49.64, Depression  Significant Hospital Events   8/22 transfer from [place] on full vent support, sedation. 8/23 - started Nimbex and  cycle # prone 16h/supine 8h in pm - ARDS protocol 8/24 - - still prone. fio2 down  To 40%. Deeply sedated and paralyzed. Not on pressors. Making  Urine. CXR with severe ARDS. BIS 30s. PF ratio improved to 175 while prone 8/26>> Proning d/c'd, Nimbex d/c'd 8/27>> 40%, PEEP weaned to 5, Rate 35, BP elevated, 275 mcg Fentanyl, 4 mg Versed 8/27 - cotninues vent, 40%, peep 5.  nimbix discontinues 8/26, fent gtt, versed gtt. -> BIS. Prone d/c'd - currently supine,  P/F 160,   Making urine. No pressors, Hypertensive today again  has  needed scheduled cardizem and labetalol to achieve control.   Consults:    Procedures:  ETT 8/21 >>  RUE PICC ? date  Significant Diagnostic Tests:  Echo 04/24/2020 1. Left ventricular ejection fraction, by estimation, is 55 to 60%. The left ventricle has normal function. Left ventricular endocardial border not optimally defined to evaluate regional wall motion. Left ventricular diastolic parameters are consistent with Grade I diastolic dysfunction (impaired relaxation). 2. Right ventricular systolic function is normal. The right ventricular size is normal. Tricuspid regurgitation signal  is inadequate for assessing PA pressure. 3. The mitral valve is grossly normal. No evidence of mitral valve regurgitation. No evidence of mitral stenosis. 4. The aortic valve is grossly normal. Aortic valve regurgitation is not visualized. No aortic stenosis is present  Micro Data:  COVID 8/09 >> positive COVID 8/17 >> positive MRSA PCR 8/22 >> Blood 8/22 >>No growth Sputum 8/22 >>Normal flora Urine 8/23 - GPC  Antimicrobials:  Remdesivir 8/17 >> Steroids 8/17 >> (10 days) No Immune modulator eg Acterma (hx of crohns) xxxxxx  Antibiotics Given (last 72 hours)    None        Interim history/subjective:   04/27/2020 - increaesd fio2 needs to 60%. RN slightly weaning sedation gtt. Not on pressors. On scheduled labetalol now - 8L negative  Objective   Blood pressure (!) 166/54, pulse 82, temperature (!) 100.4 F (38 C), resp. rate (!) 36, height 5' 3"  (1.6 m), weight 123.4 kg, SpO2 94 %.    Vent Mode: PRVC FiO2 (%):  [40 %-60 %] 50 % Set Rate:  [35 bmp] 35 bmp Vt Set:  [320 mL] 320 mL PEEP:  [5 cmH20-8 cmH20] 8 cmH20 Plateau Pressure:  [20 cmH20-24 cmH20] 24 cmH20   Intake/Output Summary (Last 24 hours) at 04/27/2020 1209 Last data filed at 04/27/2020 0950 Gross per 24 hour  Intake 5012 ml  Output 4925 ml  Net 87 ml   Filed Weights   04/25/20 0500 04/26/20 0453 04/27/20 0419  Weight: 124.6 kg 122.3 kg 123.4 kg    Examination: General Appearance: Critically ill, obese, on mechanical ventilation. SUPINE Head:  Normocephalic, without obvious abnormality, atraumatic, ETT secure and intact, NG tube secure and intact Eyes:  PERRL - cannot examined, conjunctiva/corneas - cannot examine     Ears:  Normal external ear canals, both ears Nose:  G tube - yes Throat:  ETT TUBE - yes , OG tube - no NG tube - yes Neck:  Supple,  No enlargement/tenderness/nodules Lungs: Bilateral chest excursion, Clear with few rhonchi  to auscultation bilaterally, Ventilator   Synchrony -  yes Heart:  S1 and S2 normal, RRR, no murmur, CVP - x.  Pressors - no Abdomen:  Soft, no masses, no organomegaly, ND Genitalia / Rectal:  Foley cath Extremities:  No obvious deformities, brisk capillary refill Skin:  intact in exposed areas .Warm and dry. No lesions or rash noted  Neurologic:  Sedation - deep sedatin , no paralytic -> RASS - 3/-2 turned her head when heard hsband name         Resolved Hospital Problem list   High TGL - holding diprivan  Assessment & Plan:   Acute hypoxic/hypercapnic respiratory failure with ARDS from COVID 19 pneumonia. S/p nimbex ending 8/26 Hx of asthma, obstructive sleep apnea.  04/27/2020 - > does not meet criteria for/Extubation in setting of Acute Respiratory Failure due to COVID ARDS. Inceased fio2 need   Plan - Hold off SBT - Fentanyl and versed for vent synchrony - slowly wean dow - ARDs protocol on PRVC  - goal SpO2 88 to 95%  - goal Vt 6 cc/kg  - f/u CXR, ABG - - pulmicort, brovana, prn albutero - continued  scheduled lasix based on FACTT trial data for negative balance as Na and renal function allow  - KCL with lasix  - Needs trach  COVID-19 criticall ill infection, unvaccinated, immune suppressed baseline - s/p remdesiviur  plan - solumedrol - 1o d course \- not candidate for tocilizumab or baricitinab since she has been on immunosuppressive therapy for Crohn's colitis  Acute metabolic encephalopathy 2nd to hypoxia, hypercapnia. Hx of depression.  04/27/2020 -  RASS -3 and  on deep sedation   Plan - RASS goal -3  -> aim for -2 -  versed gtt and continue fent gtt - hold outpt  trazodone  Hx of Crohn's colitis.  04/27/2020 - tolrateing TF at 20cc/.h  plan - hold outpt mercaptopurine, mesalamine - if she develops significant GI symptoms, them might need GI assessment >> followed by GI in [place] feeding tube, post pyloric  Hx of HTN. - 8/28 - SBP remains elevated today - on scheudled  labetalol  Plan  - continue scheduled  cardizem and labetalol   DM type 2 poorly controlled with steroid induced hyperglycemia.  Plan - SSI with levemir and tradjenta  Electrolyte imbalance  04/27/2020 - low K esp with lasix  Plan - give addition K - Trend BMET  - replete electrolytes prn   - monitor Na and renal function  in setting of lasix therapy  Best practice:  Diet: tube feeds DVT prophylaxis: enoxaparin GI prophylaxis: protonix  Mobility: bed Code Status: full  Disposition: ICU  Family:   - 8/23 - Husband Donnie Gedeon [phone] - updated in detail and sister-law Sharyn Lull (RT) x 2 - 8/24 - husband Saleema Weppler updated and later sister in law Sharyn Lull over phone 8/25- husband Coreena Rubalcava 8/27 - husband has been updated by nursing staff 8/28 - huysband - went to VM when called 12:23 PM     [place]  The patient SHAMIYAH NGU is critically ill with multiple organ systems failure and requires high complexity decision making for assessment and support, frequent evaluation and titration of therapies, application of advanced monitoring technologies and extensive interpretation of multiple databases.   Critical Care Time devoted to patient care services described in this note is  35  Minutes. This time reflects time of care of this signee Dr Brand Males. This critical care time does not reflect procedure time, or teaching time or supervisory time of PA/NP/Med student/Med Resident etc but could involve care discussion time     Dr. Brand Males, M.D., [facility].C.P Pulmonary and Critical Care Medicine Staff Physician [facility] Pager: [phone], If no answer or between  15:00h - 7:00h: call [phone]  04/27/2020 12:22 PM     [facility]  Lab 04/22/20 2025 04/23/20 0439 04/23/20 1420 04/24/20 1519 04/27/20 0410  PHART 7.368 7.403 7.433 7.432 7.481*   PCO2ART 58.4* 53.6* 52.2* 51.7* 62.1*  PO2ART 60.9* 69.6* 53.6* 64.1* 67.4*  HCO3 33.0* 32.7* 34.3* 33.8* 45.8*  O2SAT 92.5 94.0 89.1 92.3 93.5    CBC Recent Labs  Lab 04/25/20 0344 04/26/20 0428 04/27/20 0437  HGB 12.2 12.1 11.4*  HCT 37.9 38.3 35.6*  WBC 7.1 7.5 7.6  PLT 158 147* 144*    COAGULATION Recent Labs  Lab 04/23/20 0431 04/25/20 0344  INR 1.2 1.1    CARDIAC  No results for input(s): TROPONINI in the last 168 hours. No results for input(s): PROBNP in the last 168 hours.   CHEMISTRY Recent Labs  Lab 04/22/20 0227 04/22/20 0227 04/23/20 0431 04/23/20 0431 04/24/20 0440 04/24/20 0440 04/25/20 0344 04/25/20 0344 04/26/20 0428 04/27/20 0437  NA 146*   < > 143  --  145  --  147*  --  151* 143  K 3.9   < > 3.9   < > 3.4*   < > 4.2   < > 4.2 3.7  CL 110   < > 105  --  108  --  103  --  100 94*  CO2 28   < > 30  --  30  --  35*  --  41* 39*  GLUCOSE 232*   < > 174*  --  222*  --  258*  --  273* 215*  BUN 22*   < > 26*  --  43*  --  46*  --  44* 35*  CREATININE 0.55   < > 0.38*  --  0.61  --  0.60  --  0.62 0.48  CALCIUM 8.3*   < > 8.1*  --  7.6*  --  8.2*  --  8.1* 7.1*  MG 2.5*  --  2.5*  --   --   --  2.5*  --  2.6* 2.2  PHOS 2.0*  --  2.1*  --  3.8  --  2.6  --  2.4*  --    < > = values in this interval not displayed.   Estimated Creatinine Clearance: 103.7 mL/min (by C-G formula based on SCr of 0.48 mg/dL).   LIVER Recent Labs  Lab 04/23/20 0431 04/24/20 0440 04/25/20 0344 04/26/20 0428 04/27/20 0437  AST 17 13* 13* 20 29  ALT 31 23 21 26  40  ALKPHOS 80 63 69 67 59  BILITOT 0.7 1.0 0.8 0.7 1.1  PROT 5.8* 4.8* 5.4* 5.7* 5.0*  ALBUMIN 2.3* 1.9* 2.2* 2.3* 2.1*  INR 1.2  --  1.1  --   --      INFECTIOUS Recent Labs  Lab 04/25/20 0344 04/26/20 0500 04/27/20 0437  LATICACIDVEN 2.1* 2.4* 2.1*     ENDOCRINE CBG (last 3)  Recent Labs    04/27/20 0435 04/27/20 0842 04/27/20 1202  GLUCAP 193* 262* 222*         IMAGING  x48h  - image(s) personally visualized  -   highlighted in bold DG CHEST PORT 1 VIEW  Result Date: 04/27/2020 CLINICAL DATA:  Respiratory failure EXAM: PORTABLE CHEST 1 VIEW COMPARISON:  April 06, 2020 FINDINGS: The right PICC line is stable. The feeding tube terminates below today's film. The ETT is in good position. No pneumothorax. Bilateral patchy pulmonary infiltrates are similar in the interval. No interval changes. IMPRESSION: 1. Support apparatus as above. 2. Bilateral pulmonary infiltrates are similar in the interval. Recommend follow-up to resolution. Electronically Signed   By: Dorise Bullion III M.D   On: 04/27/2020 11:18   DG CHEST PORT 1 VIEW  Result Date: 04/26/2020 CLINICAL DATA:  COVID. EXAM: PORTABLE CHEST 1 VIEW COMPARISON:  04/25/2020. FINDINGS: Endotracheal tube, feeding tube, right PICC line in stable position. Heart size stable. Diffuse bilateral pulmonary infiltrates again noted. Similar findings on prior exam. No pleural effusion or pneumothorax. IMPRESSION: 1.  Lines and tubes in stable position. 2. Diffuse bilateral pulmonary infiltrates again noted without interim change. Electronically Signed   By: Marcello Moores  Register   On: 04/26/2020 06:37

## 2020-04-28 ENCOUNTER — Inpatient Hospital Stay (HOSPITAL_COMMUNITY): Payer: BC Managed Care – PPO

## 2020-04-28 LAB — CBC
HCT: 38.8 % (ref 36.0–46.0)
Hemoglobin: 12.2 g/dL (ref 12.0–15.0)
MCH: 32.8 pg (ref 26.0–34.0)
MCHC: 31.4 g/dL (ref 30.0–36.0)
MCV: 104.3 fL — ABNORMAL HIGH (ref 80.0–100.0)
Platelets: 166 10*3/uL (ref 150–400)
RBC: 3.72 MIL/uL — ABNORMAL LOW (ref 3.87–5.11)
RDW: 11.7 % (ref 11.5–15.5)
WBC: 9.2 10*3/uL (ref 4.0–10.5)
nRBC: 0 % (ref 0.0–0.2)

## 2020-04-28 LAB — GLUCOSE, CAPILLARY
Glucose-Capillary: 301 mg/dL — ABNORMAL HIGH (ref 70–99)
Glucose-Capillary: 258 mg/dL — ABNORMAL HIGH (ref 70–99)
Glucose-Capillary: 307 mg/dL — ABNORMAL HIGH (ref 70–99)
Glucose-Capillary: 222 mg/dL — ABNORMAL HIGH (ref 70–99)
Glucose-Capillary: 202 mg/dL — ABNORMAL HIGH (ref 70–99)
Glucose-Capillary: 191 mg/dL — ABNORMAL HIGH (ref 70–99)

## 2020-04-28 LAB — BLOOD GAS, ARTERIAL
Acid-Base Excess: 18.3 mmol/L — ABNORMAL HIGH (ref 0.0–2.0)
Bicarbonate: 45.7 mmol/L — ABNORMAL HIGH (ref 20.0–28.0)
FIO2: 70
O2 Saturation: 91.9 %
Patient temperature: 98.6
pCO2 arterial: 63.6 mmHg — ABNORMAL HIGH (ref 32.0–48.0)
pH, Arterial: 7.469 — ABNORMAL HIGH (ref 7.350–7.450)
pO2, Arterial: 64.8 mmHg — ABNORMAL LOW (ref 83.0–108.0)

## 2020-04-28 LAB — COMPREHENSIVE METABOLIC PANEL
ALT: 55 U/L — ABNORMAL HIGH (ref 0–44)
AST: 31 U/L (ref 15–41)
Albumin: 2.3 g/dL — ABNORMAL LOW (ref 3.5–5.0)
Alkaline Phosphatase: 59 U/L (ref 38–126)
Anion gap: 8 (ref 5–15)
BUN: 39 mg/dL — ABNORMAL HIGH (ref 6–20)
CO2: 39 mmol/L — ABNORMAL HIGH (ref 22–32)
Calcium: 7.3 mg/dL — ABNORMAL LOW (ref 8.9–10.3)
Chloride: 94 mmol/L — ABNORMAL LOW (ref 98–111)
Creatinine, Ser: 0.49 mg/dL (ref 0.44–1.00)
GFR calc Af Amer: >60 mL/min (ref >60)
GFR calc non Af Amer: >60 mL/min (ref >60)
Glucose, Bld: 272 mg/dL — ABNORMAL HIGH (ref 70–99)
Potassium: 3.9 mmol/L (ref 3.5–5.1)
Sodium: 141 mmol/L (ref 135–145)
Total Bilirubin: 0.8 mg/dL (ref 0.3–1.2)
Total Protein: 5.5 g/dL — ABNORMAL LOW (ref 6.5–8.1)

## 2020-04-28 LAB — C-REACTIVE PROTEIN: CRP: 0.5 mg/dL (ref <1.0)

## 2020-04-28 LAB — PHOSPHORUS: Phosphorus: 3 mg/dL (ref 2.5–4.6)

## 2020-04-28 LAB — PROTIME-INR
INR: 1 (ref 0.8–1.2)
Prothrombin Time: 13.2 seconds (ref 11.4–15.2)

## 2020-04-28 LAB — D-DIMER, QUANTITATIVE: D-Dimer, Quant: 3.43 ug/mL-FEU — ABNORMAL HIGH (ref 0.00–0.50)

## 2020-04-28 LAB — LACTIC ACID, PLASMA
Lactic Acid, Venous: 2.7 mmol/L (ref 0.5–1.9)
Lactic Acid, Venous: 2.1 mmol/L (ref 0.5–1.9)

## 2020-04-28 LAB — PROCALCITONIN: Procalcitonin: <0.1 ng/mL

## 2020-04-28 LAB — MAGNESIUM: Magnesium: 2.3 mg/dL (ref 1.7–2.4)

## 2020-04-28 MED ORDER — SODIUM CHLORIDE 0.9 % IV SOLN
2.0000 g | Freq: Three times a day (TID) | INTRAVENOUS | Status: AC
  Administered 2020-04-28 – 2020-05-07 (×29): 2 g via INTRAVENOUS
  Filled 2020-04-28 (×29): qty 2

## 2020-04-28 MED ORDER — VANCOMYCIN HCL 2000 MG/400ML IV SOLN
2000.0000 mg | Freq: Once | INTRAVENOUS | Status: AC
  Administered 2020-04-28: 2000 mg via INTRAVENOUS
  Filled 2020-04-28: qty 400

## 2020-04-28 MED ORDER — VANCOMYCIN HCL 1250 MG/250ML IV SOLN
1250.0000 mg | Freq: Two times a day (BID) | INTRAVENOUS | Status: DC
  Administered 2020-04-29 – 2020-05-01 (×5): 1250 mg via INTRAVENOUS
  Filled 2020-04-28 (×6): qty 250

## 2020-04-28 MED ORDER — LACTATED RINGERS IV BOLUS
500.0000 mL | Freq: Once | INTRAVENOUS | Status: AC
  Administered 2020-04-28: 500 mL via INTRAVENOUS

## 2020-04-28 MED ORDER — PNEUMOCOCCAL VAC POLYVALENT 25 MCG/0.5ML IJ INJ
0.5000 mL | INJECTION | INTRAMUSCULAR | Status: DC
  Filled 2020-04-28: qty 0.5

## 2020-04-28 MED ORDER — POTASSIUM CHLORIDE 20 MEQ/15ML (10%) PO SOLN
20.0000 meq | Freq: Once | ORAL | Status: AC
  Administered 2020-04-28: 20 meq
  Filled 2020-04-28: qty 15

## 2020-04-28 NOTE — Progress Notes (Signed)
Pharmacy Antibiotic Note  [name] is a 54 y.o. female admitted on 04/20/2020 with COVID-19 pneumonia.  Pharmacy has been consulted for Vancomycin and Cefepime dosing for possible VAP given SIRs, fever, rising lactate, O2 needs.  Tm 100.6, WBC wnl, Lactic acid 2.7  Plan: Cefepime 2g IV q8h Vancomycin 2g IV x1 then 1250 mg IV q12h. Measure Vanc trough at steady state. Follow up renal function, culture results, and clinical course.   Height: 5' 3"  (160 cm) Weight: 122.3 kg (269 lb 10 oz) IBW/kg (Calculated) : 52.4  Temp (24hrs), Avg:99.8 F (37.7 C), Min:98.6 F (37 C), Max:100.6 F (38.1 C)  Recent Labs  Lab 04/24/20 0440 04/25/20 0344 04/26/20 0428 04/26/20 0500 04/27/20 0437 04/28/20 0508  WBC 7.9 7.1 7.5  --  7.6 9.2  CREATININE 0.61 0.60 0.62  --  0.48 0.49  LATICACIDVEN  --  2.1*  --  2.4* 2.1* 2.7*    Estimated Creatinine Clearance: 103.2 mL/min (by C-G formula based on SCr of 0.49 mg/dL).    Allergies  Allergen Reactions  . Remicade [Infliximab] Swelling    Joints locked up  . Cefprozil Rash  . Penicillins Rash    Did it involve swelling of the face/tongue/throat, SOB, or low BP? No Did it involve sudden or severe rash/hives, skin peeling, or any reaction on the inside of your mouth or nose? No Did you need to seek medical attention at a hospital or doctor's office? No When did it last happen? If all above answers are "NO", may proceed with cephalosporin use.     Antimicrobials this admission: 8/29 Vancomycin >>  8/29 Cefepime >>   Dose adjustments this admission:   Microbiology results: 8/9 COVID+ 8/17 COVID+ 8/22 BCx: NGF 8/22 MRSA PCR: 8/22 Sputum: normal flora 8/23 UCx: 90k Staph epidermidis (suspect contaminant, obtained from old foley) 8/26 MRSA PCR: neg 8/29 BCx: 8/29 UCx: 8/29 Trach asp:     Thank you for allowing pharmacy to be a part of this patient's care.  Gretta Arab PharmD, BCPS Clinical Pharmacist [facility] main  pharmacy [phone] 04/28/2020 1:13 PM

## 2020-04-28 NOTE — Progress Notes (Addendum)
NAME:  [name], MRN:  [id number], DOB:  10/13/1965, LOS: 8 ADMISSION DATE:  04/20/2020, CONSULTATION DATE:  04/21/20 REFERRING MD:  , CHIEF COMPLAINT: short of breath  Brief History   54 yo female tested positive for COVID 19 on 8/09.  Had progressive symptoms and admitted to [facility] on 8/17.  Treated with remdesivir, prednisone and ABx for CAP.  She developed worsening hypoxia and required intubation.  She was transferred from [facility] with VDRF from [place] 19 pneumonia with ARDS.  Past Medical History  Crohn's colitis, Carpal tunnel, Osteoarthritis, DM type 2, Asthma, HTN, HLD, OSA, Morbid obesity with BMI 49.64, Depression  Significant Hospital Events   8/22 transfer from [place] on full vent support, sedation. 8/23 - started Nimbex and  cycle # prone 16h/supine 8h in pm - ARDS protocol 8/24 - - still prone. fio2 down  To 40%. Deeply sedated and paralyzed. Not on pressors. Making  Urine. CXR with severe ARDS. BIS 30s. PF ratio improved to 175 while prone 8/26>> Proning d/c'd, Nimbex d/c'd 8/27>> 40%, PEEP weaned to 5, Rate 35, BP elevated, 275 mcg Fentanyl, 4 mg Versed 8/27 - cotninues vent, 40%, peep 5.  nimbix discontinues 8/26, fent gtt, versed gtt. -> BIS. Prone d/c'd - currently supine,  P/F 160,   Making urine. No pressors, Hypertensive today again  has  needed scheduled cardizem and labetalol to achieve control.   8/28 -  increaesd fio2 needs to 60%. RN slightly weaning sedation gtt. Not on pressors. On scheduled labetalol now - 8L negative   Consults:    Procedures:  ETT 8/21 >>  RUE PICC ? date  Significant Diagnostic Tests:  Echo 04/24/2020 1. Left ventricular ejection fraction, by estimation, is 55 to 60%. The left ventricle has normal function. Left ventricular endocardial border not optimally defined to evaluate regional wall motion. Left ventricular diastolic parameters are consistent with Grade I diastolic dysfunction (impaired  relaxation). 2. Right ventricular systolic function is normal. The right ventricular size is normal. Tricuspid regurgitation signal is inadequate for assessing PA pressure. 3. The mitral valve is grossly normal. No evidence of mitral valve regurgitation. No evidence of mitral stenosis. 4. The aortic valve is grossly normal. Aortic valve regurgitation is not visualized. No aortic stenosis is present  Micro Data:  COVID 8/09 >> positive COVID 8/17 >> positive MRSA PCR 8/22 >> Blood 8/22 >>No growth Sputum 8/22 >>Normal flora Urine 8/23 - GPC xxxxxx Trach aspirate 8/29 Blood  8/29 Urine 8/29  Antimicrobials:  Remdesivir 8/17 >> Steroids 8/17 >> 8/22 ->  (10 days) No Immune modulator eg Acterma (hx of crohns) xxxxxx vanc 8/29 Cefepime 8/29     Interim history/subjective:   04/28/2020 - worsening hypoxemia 70%. Rnning fever . Though wbc ok. Not on pressors. On fent gtt, and versed gtt. A bit mor responsive . -10L negative   Objective   Blood pressure (!) 183/68, pulse 68, temperature 99.3 F (37.4 C), resp. rate (!) 35, height 5' 3"  (1.6 m), weight 122.3 kg, SpO2 90 %.    Vent Mode: PRVC FiO2 (%):  [50 %-70 %] 70 % Set Rate:  [35 bmp] 35 bmp Vt Set:  [310 mL-320 mL] 310 mL PEEP:  [8 cmH20] 8 cmH20 Plateau Pressure:  [18 cmH20-21 cmH20] 20 cmH20   Intake/Output Summary (Last 24 hours) at 04/28/2020 1220 Last data filed at 04/28/2020 0500 Gross per 24 hour  Intake 1565.56 ml  Output 3600 ml  Net -2034.44 ml  Filed Weights   04/26/20 0453 04/27/20 0419 04/28/20 0500  Weight: 122.3 kg 123.4 kg 122.3 kg    Examination: General Appearance: Critically ill, obese, on mechanical ventilation. SUPINE Head:  Normocephalic, without obvious abnormality, atraumatic, ETT secure and intact, NG tube secure and intact Eyes:  PERRL - cannot examined, conjunctiva/corneas - cannot examine     Ears:  Normal external ear canals, both ears Nose:  G tube - yes Throat:  ETT TUBE - yes ,  OG tube - no NG tube - yes Neck:  Supple,  No enlargement/tenderness/nodules Lungs: Bilateral chest excursion, Clear with few rhonchi  to auscultation bilaterally, Ventilator   Synchrony - yes Heart:  S1 and S2 normal, RRR, no murmur, CVP - x.  Pressors - no Abdomen:  Soft, no masses, no organomegaly, ND Genitalia / Rectal:  Foley cath Extremities:  No obvious deformities, brisk capillary refill Skin:  intact in exposed areas .Warm and dry. No lesions or rash noted  Neurologic:  Sedation - deep sedatin , no paralytic -> RASS - 3/-2 turned her head when heard hsband name  No change since 04/27/20    Resolved Hospital Problem list   High TGL - holding diprivan  Assessment & Plan:   Acute hypoxic/hypercapnic respiratory failure with ARDS from COVID 19 pneumonia. S/p nimbex ending 8/26  Hx of asthma, obstructive sleep apnea.   04/28/2020 - > does not meet criteria for SBT/Extubation in setting of Acute Respiratory Failure due to worsneing oxygenation and concern for sepsis -  Plan - Hold off SBT  - Rx HAI/potential VAP (see below) - Fentanyl and versed for vent synchrony - slowly wean dow - ARDS protocol on PRVC  - goal SpO2 88 to 95%  - goal Vt 6 cc/kg  - f/u CXR, ABG - COnsider prone - low threshold esp if no response to abx or any futher worsening - - pulmicort, brovana, prn albutero - dc lasix (-10L: negative) - Needs trach when stable (husband informed 04/26/20)  COVID-19 criticall ill infection, unvaccinated, immune suppressed baseline - s/p remdesiviur  plan - solumedrol - 10 d course - not candidate for tocilizumab or baricitinab since she has been on immunosuppressive therapy for Crohn's colitis  Concern for Sepsis/ SIRS + with lactic acidois and worsenin hypoxemia  04/28/2020 - SIRs with fever and lcatate 2.7 and rising. WBC ok Rising o2 needs suggests VAP  Plan  - pan culture - dc lasix  - 500cc fluid bolus  -empiric abx as above  Acute metabolic  encephalopathy 2nd to hypoxia, hypercapnia. Hx of depression.  04/28/2020 -  RASS -3 and  on deep sedation though improving   Plan - RASS goal -3  -> aim for -2 -  versed gtt and continue fent gtt - hold outpt  trazodone  Hx of Crohn's colitis.  04/28/2020 - tolrateing TF   plan - hold outpt mercaptopurine, mesalamine - if she develops significant GI symptoms, them might need GI assessment >> followed by GI in [place] feeding tube, post pyloric  Hx of HTN. - 8/29 - BP better after scheduled medications  Plan  - continue scheduled  cardizem and labetalol   DM type 2 poorly controlled with steroid induced hyperglycemia.  Plan - SSI with levemir and tradjenta  Electrolyte imbalance  04/28/2020 - K < 4  Plan - give prn K - Trend BMET  - replete electrolytes prn   - monitor Na and renal function  in setting of lasix therapy  Best  practice:  Diet: tube feeds DVT prophylaxis: enoxaparin GI prophylaxis: protonix  Mobility: bed Code Status: full  Disposition: ICU  Family:   - 8/23 - Husband [name] [phone] - updated in detail and sister-law Sharyn Lull (RT) x 2 - 8/24 - husband Kiyana Vazguez updated and later sister in law Sharyn Lull over phone 8/25- husband Montez Stryker 8/27 - husband has been updated by nursing staff 8/28 - huysband - went to VM when called . Later updated sister in law Sharyn Lull who is a [facility] ICU - RRT 8/29 -  Husband Lindell Tussey - updated esp of potential HAI He expressed interest in proning - discussed. Plan to Rx fever/and potential HAI. If no response or getting worse, then prone (likely need deeper sedation  nd paralytic). He is ok with this plan    ATTESTATION & SIGNATURE   [name] is critically ill with multiple organ systems failure and requires high complexity decision making for assessment and support, frequent evaluation and titration of therapies, application of advanced monitoring technologies and  extensive interpretation of multiple databases.   Critical Care Time devoted to patient care services described in this note is  45  Minutes. This time reflects time of care of this signee Dr Brand Males. This critical care time does not reflect procedure time, or teaching time or supervisory time of PA/NP/Med student/Med Resident etc but could involve care discussion time     Dr. Brand Males, M.D., [facility].C.P Pulmonary and Critical Care Medicine Staff Physician [facility] [facility] Pager: [phone], If no answer or between  15:00h - 7:00h: call [phone]  04/28/2020 12:38 PM    LABS    PULMONARY Recent Labs  Lab 04/23/20 0439 04/23/20 1420 04/24/20 1519 04/27/20 0410 04/28/20 1107  PHART 7.403 7.433 7.432 7.481* 7.469*  PCO2ART 53.6* 52.2* 51.7* 62.1* 63.6*  PO2ART 69.6* 53.6* 64.1* 67.4* 64.8*  HCO3 32.7* 34.3* 33.8* 45.8* 45.7*  O2SAT 94.0 89.1 92.3 93.5 91.9    CBC Recent Labs  Lab 04/26/20 0428 04/27/20 0437 04/28/20 0508  HGB 12.1 11.4* 12.2  HCT 38.3 35.6* 38.8  WBC 7.5 7.6 9.2  PLT 147* 144* 166    COAGULATION Recent Labs  Lab 04/23/20 0431 04/25/20 0344 04/28/20 0508  INR 1.2 1.1 1.0    CARDIAC  No results for input(s): TROPONINI in the last 168 hours. No results for input(s): PROBNP in the last 168 hours.   CHEMISTRY Recent Labs  Lab 04/23/20 0431 04/23/20 0431 04/24/20 0440 04/24/20 0440 04/25/20 0344 04/25/20 0344 04/26/20 0428 04/26/20 0428 04/27/20 0437 04/28/20 0508  NA 143   < > 145  --  147*  --  151*  --  143 141  K 3.9   < > 3.4*   < > 4.2   < > 4.2   < > 3.7 3.9  CL 105   < > 108  --  103  --  100  --  94* 94*  CO2 30   < > 30  --  35*  --  41*  --  39* 39*  GLUCOSE 174*   < > 222*  --  258*  --  273*  --  215* 272*  BUN 26*   < > 43*  --  46*  --  44*  --  35* 39*  CREATININE 0.38*   < > 0.61  --  0.60  --  0.62  --  0.48 0.49  CALCIUM 8.1*   < > 7.6*  --  8.2*  --   8.1*  --  7.1* 7.3*  MG 2.5*  --   --   --  2.5*  --  2.6*  --  2.2 2.3  PHOS 2.1*  --  3.8  --  2.6  --  2.4*  --   --  3.0   < > = values in this interval not displayed.   Estimated Creatinine Clearance: 103.2 mL/min (by C-G formula based on SCr of 0.49 mg/dL).   LIVER Recent Labs  Lab 04/23/20 0431 04/23/20 0431 04/24/20 0440 04/25/20 0344 04/26/20 0428 04/27/20 0437 04/28/20 0508  AST 17   < > 13* 13* 20 29 31   ALT 31   < > 23 21 26  40 55*  ALKPHOS 80   < > 63 69 67 59 59  BILITOT 0.7   < > 1.0 0.8 0.7 1.1 0.8  PROT 5.8*   < > 4.8* 5.4* 5.7* 5.0* 5.5*  ALBUMIN 2.3*   < > 1.9* 2.2* 2.3* 2.1* 2.3*  INR 1.2  --   --  1.1  --   --  1.0   < > = values in this interval not displayed.     INFECTIOUS Recent Labs  Lab 04/26/20 0500 04/27/20 0437 04/28/20 0508  LATICACIDVEN 2.4* 2.1* 2.7*     ENDOCRINE CBG (last 3)  Recent Labs    04/28/20 0122 04/28/20 0443 04/28/20 1050  GLUCAP 301* 258* 307*         IMAGING x48h  - image(s) personally visualized  -   highlighted in bold DG Abd 1 View  Result Date: 04/27/2020 CLINICAL DATA:  Evaluate feeding tube EXAM: ABDOMEN - 1 VIEW COMPARISON:  April 23, 2020 FINDINGS: The distal tip of the feeding tube is in the distal stomach. IMPRESSION: The distal tip of the feeding tube is in the distal stomach. Recommend advancement into the distal duodenum before use. Electronically Signed   By: Dorise Bullion III M.D   On: 04/27/2020 17:14   DG CHEST PORT 1 VIEW  Result Date: 04/28/2020 CLINICAL DATA:  COVID-19 positive. Shortness of breath. ETT placement. EXAM: PORTABLE CHEST 1 VIEW COMPARISON:  April 27, 2020 FINDINGS: The ETT is in good position. The feeding tube is not well seen distally due to poor penetration but appears to terminate below today's film. Stable right PICC line. No pneumothorax. No focal infiltrate on the right. Persistent infiltrate in the left base. Possible layering effusion. IMPRESSION: 1. Possible left  layering effusion. Infiltrate in left base is stable. No focal infiltrate seen on the right on today's study. 2. Support apparatus as above. Electronically Signed   By: Dorise Bullion III M.D   On: 04/28/2020 11:56   DG CHEST PORT 1 VIEW  Result Date: 04/27/2020 CLINICAL DATA:  Respiratory failure EXAM: PORTABLE CHEST 1 VIEW COMPARISON:  April 06, 2020 FINDINGS: The right PICC line is stable. The feeding tube terminates below today's film. The ETT is in good position. No pneumothorax. Bilateral patchy pulmonary infiltrates are similar in the interval. No interval changes. IMPRESSION: 1. Support apparatus as above. 2. Bilateral pulmonary infiltrates are similar in the interval. Recommend follow-up to resolution. Electronically Signed   By: Dorise Bullion III M.D   On: 04/27/2020 11:18

## 2020-04-29 LAB — GLUCOSE, CAPILLARY
Glucose-Capillary: 183 mg/dL — ABNORMAL HIGH (ref 70–99)
Glucose-Capillary: 213 mg/dL — ABNORMAL HIGH (ref 70–99)
Glucose-Capillary: 240 mg/dL — ABNORMAL HIGH (ref 70–99)
Glucose-Capillary: 201 mg/dL — ABNORMAL HIGH (ref 70–99)
Glucose-Capillary: 210 mg/dL — ABNORMAL HIGH (ref 70–99)
Glucose-Capillary: 241 mg/dL — ABNORMAL HIGH (ref 70–99)
Glucose-Capillary: 226 mg/dL — ABNORMAL HIGH (ref 70–99)
Glucose-Capillary: 213 mg/dL — ABNORMAL HIGH (ref 70–99)

## 2020-04-29 LAB — BLOOD GAS, ARTERIAL
Acid-Base Excess: 14.7 mmol/L — ABNORMAL HIGH (ref 0.0–2.0)
Bicarbonate: 40.9 mmol/L — ABNORMAL HIGH (ref 20.0–28.0)
FIO2: 70
MECHVT: 310 mL
O2 Saturation: 90.9 %
PEEP: 310 cmH2O
Patient temperature: 99.5
RATE: 35 resp/min
pCO2 arterial: 58.7 mmHg — ABNORMAL HIGH (ref 32.0–48.0)
pH, Arterial: 7.46 — ABNORMAL HIGH (ref 7.350–7.450)
pO2, Arterial: 65.7 mmHg — ABNORMAL LOW (ref 83.0–108.0)

## 2020-04-29 LAB — PHOSPHORUS: Phosphorus: 3.3 mg/dL (ref 2.5–4.6)

## 2020-04-29 LAB — D-DIMER, QUANTITATIVE: D-Dimer, Quant: 3.04 ug/mL-FEU — ABNORMAL HIGH (ref 0.00–0.50)

## 2020-04-29 LAB — PROCALCITONIN: Procalcitonin: <0.1 ng/mL

## 2020-04-29 LAB — C-REACTIVE PROTEIN: CRP: 0.6 mg/dL (ref <1.0)

## 2020-04-29 MED ORDER — SENNOSIDES 8.8 MG/5ML PO SYRP
15.0000 mL | ORAL_SOLUTION | Freq: Once | ORAL | Status: AC
  Administered 2020-04-29: 15 mL via ORAL
  Filled 2020-04-29: qty 15

## 2020-04-29 NOTE — Progress Notes (Signed)
NAME:  [name], MRN:  [id number], DOB:  09-Feb-1966, LOS: 9 ADMISSION DATE:  04/20/2020, CONSULTATION DATE:  04/21/20 REFERRING MD:  , CHIEF COMPLAINT: short of breath  Brief History   54 yo female tested positive for COVID 19 on 8/09.  Had progressive symptoms and admitted to [facility] on 8/17.  Treated with remdesivir, prednisone and ABx for CAP.  She developed worsening hypoxia and required intubation.  She was transferred from [facility] with VDRF from [place] 19 pneumonia with ARDS.  Past Medical History  Crohn's colitis, Carpal tunnel, Osteoarthritis, DM type 2, Asthma, HTN, HLD, OSA, Morbid obesity with BMI 49.64, Depression  Significant Hospital Events   8/22 transfer from [place] on full vent support, sedation. 8/23 - started Nimbex and  cycle # prone 16h/supine 8h in pm - ARDS protocol 8/24 - - still prone. fio2 down  To 40%. Deeply sedated and paralyzed. Not on pressors. Making  Urine. CXR with severe ARDS. BIS 30s. PF ratio improved to 175 while prone 8/26>> Proning d/c'd, Nimbex d/c'd 8/27>> 40%, PEEP weaned to 5, Rate 35, BP elevated, 275 mcg Fentanyl, 4 mg Versed 8/27 - cotninues vent, 40%, peep 5.  nimbix discontinues 8/26, fent gtt, versed gtt. -> BIS. Prone d/c'd - currently supine,  P/F 160,   Making urine. No pressors, Hypertensive today again  has  needed scheduled cardizem and labetalol to achieve control.   8/28 -  increaesd fio2 needs to 60%. RN slightly weaning sedation gtt. Not on pressors. On scheduled labetalol now - 8L negative  8/30-down to 40% FiO2 with a PEEP of 8, saturating 93%, no bowel movement yet   Consults:    Procedures:  ETT 8/21 >>  RUE PICC ? date  Significant Diagnostic Tests:  Echo 04/24/2020 1. Left ventricular ejection fraction, by estimation, is 55 to 60%. The left ventricle has normal function. Left ventricular endocardial border not optimally defined to evaluate regional wall motion. Left ventricular  diastolic parameters are consistent with Grade I diastolic dysfunction (impaired relaxation). 2. Right ventricular systolic function is normal. The right ventricular size is normal. Tricuspid regurgitation signal is inadequate for assessing PA pressure. 3. The mitral valve is grossly normal. No evidence of mitral valve regurgitation. No evidence of mitral stenosis. 4. The aortic valve is grossly normal. Aortic valve regurgitation is not visualized. No aortic stenosis is present  Micro Data:  COVID 8/09 >> positive COVID 8/17 >> positive MRSA PCR 8/22 >> Blood 8/22 >>No growth Sputum 8/22 >>Normal flora Urine 8/23 - GPC xxxxxx Trach aspirate 8/29 Blood  8/29 Urine 8/29  Antimicrobials:  Remdesivir 8/17 >> Steroids 8/17 >> 8/22 ->  (10 days) No Immune modulator eg Acterma (hx of crohns) xxxxxx vanc 8/29 Cefepime 8/29   Interim history/subjective:   04/29/2020 -a little better today, FiO2 down to 40% Diuresing well Constipation  Objective   Blood pressure (!) 183/69, pulse 72, temperature 99.1 F (37.3 C), resp. rate (!) 33, height 5' 3"  (1.6 m), weight 123.6 kg, SpO2 96 %.    Vent Mode: PRVC FiO2 (%):  [70 %] 70 % Set Rate:  [35 bmp] 35 bmp Vt Set:  [310 mL] 310 mL PEEP:  [8 cmH20] 8 cmH20 Plateau Pressure:  [16 cmH20-21 cmH20] 19 cmH20   Intake/Output Summary (Last 24 hours) at 04/29/2020 1148 Last data filed at 04/29/2020 0800 Gross per 24 hour  Intake 1362.87 ml  Output 1325 ml  Net 37.87 ml   [employer]  04/27/20 0419 04/28/20 0500 04/29/20 0500  Weight: 123.4 kg 122.3 kg 123.6 kg    Examination: General Appearance: Critically ill-appearing, obese Head: Normocephalic, atraumatic Eyes:  PERRL - cannot examined, conjunctiva/corneas - cannot examine     Ears:  Normal external ear canals, both ears Neck:  Supple,  No enlargement/tenderness/nodules Lungs: Clear anteriorly, few rales at the bases Heart: S1-S2 appreciated Abdomen:  Soft, no masses, no  organomegaly, ND Skin:  intact in exposed areas .Warm and dry. No lesions or rash noted  Neurologic:  Sedation - deep sedatin   Resolved Hospital Problem list   High TGL - holding diprivan  Assessment & Plan:   Acute hypoxic/hypercapnic respiratory failure with ARDS from COVID 19 pneumonia. S/p nimbex ending 8/26 Hx of asthma, obstructive sleep apnea. -Continue to wean as tolerated -ARDS protocol -Pulmicort, Brovana, albuterol as needed -Consideration for tracheostomy if unable to wean  COVID-19 pneumonia, critically ill -Solu-Medrol 10-day course -Normally on immunosuppressants for Crohn's colitis  Sepsis -Culture negative at present -Empiric antibiotics cefepime, vancomycin  Acute metabolic encephalopathy Hypoxemic respiratory failure -RASS goal -3 -Continue Versed and fentanyl  History of colitis secondary to Crohn's -Postpyloric feeding -Hold outpatient mercaptopurine and mesalamine  Hypertension -On Cardizem -On labetalol  Type 2 diabetes -SSI with Levemir and Tradjenta   Best practice:  Diet: tube feeds DVT prophylaxis: enoxaparin GI prophylaxis: protonix  Mobility: bed Code Status: full  Disposition: ICU  Family:   - 8/23 - Husband Daisia Slomski [phone] - updated in detail and sister-law Sharyn Lull (RT) x 2 - 8/24 - husband Hema Lanza updated and later sister in law Sharyn Lull over phone 8/25- husband Selah Klang 8/27 - husband has been updated by nursing staff 8/28 - huysband - went to VM when called . Later updated sister in law Sharyn Lull who is a [facility] ICU - RRT 8/29 -  Husband Jumanah Hynson - updated esp of potential HAI He expressed interest in proning - discussed. Plan to Rx fever/and potential HAI. If no response or getting worse, then prone (likely need deeper sedation  nd paralytic). He is ok with this plan  [place]   The patient is critically ill with multiple organ systems failure and requires high complexity decision making  for assessment and support, frequent evaluation and titration of therapies, application of advanced monitoring technologies and extensive interpretation of multiple databases. Critical Care Time devoted to patient care services described in this note independent of APP/resident time (if applicable)  is 32 minutes.   Sherrilyn Rist MD [place] [facility] Personal pager: [phone] If unanswered, please page CCM On-call: [phone]

## 2020-04-29 NOTE — TOC Progression Note (Signed)
Transition of Care [facility]) - Progression Note    Patient Details  Name: [name] MRN: [id number] Date of Birth: 08/31/1966  [facility]) CM/SW Contact  Leeroy Cha, RN Phone Number: 04/29/2020, 9:06 AM  Clinical Narrative:    Remains on the vent at 70%fi02, Iv solumedrol, iv abx, iv sedationm d.dimer 3.43, crp-0.5, lactic acid-2.1/ covid positive unvaccinated female Following for progression and toc needs-possible ltach once stable resp needs on vent have climbed daily.   Expected Discharge Plan: [place] (from [place]) Barriers to Discharge: Continued Medical Work up  Expected Discharge Plan and Services Expected Discharge Plan: [place] (from [place])   Discharge Planning Services: CM Consult   Living arrangements for the past 2 months: [place]                                       Social Determinants of Health (SDOH) Interventions    Readmission Risk Interventions No flowsheet data found.

## 2020-04-30 LAB — CBC WITH DIFFERENTIAL/PLATELET
Abs Immature Granulocytes: 0.93 10*3/uL — ABNORMAL HIGH (ref 0.00–0.07)
Basophils Absolute: 0.1 10*3/uL (ref 0.0–0.1)
Basophils Relative: 0 %
Eosinophils Absolute: 0 10*3/uL (ref 0.0–0.5)
Eosinophils Relative: 0 %
HCT: 38 % (ref 36.0–46.0)
Hemoglobin: 12.5 g/dL (ref 12.0–15.0)
Immature Granulocytes: 8 %
Lymphocytes Relative: 8 %
Lymphs Abs: 0.9 10*3/uL (ref 0.7–4.0)
MCH: 33.5 pg (ref 26.0–34.0)
MCHC: 32.9 g/dL (ref 30.0–36.0)
MCV: 101.9 fL — ABNORMAL HIGH (ref 80.0–100.0)
Monocytes Absolute: 1.3 10*3/uL — ABNORMAL HIGH (ref 0.1–1.0)
Monocytes Relative: 11 %
Neutro Abs: 8.7 10*3/uL — ABNORMAL HIGH (ref 1.7–7.7)
Neutrophils Relative %: 73 %
Platelets: 153 10*3/uL (ref 150–400)
RBC: 3.73 MIL/uL — ABNORMAL LOW (ref 3.87–5.11)
RDW: 11.6 % (ref 11.5–15.5)
WBC: 11.8 10*3/uL — ABNORMAL HIGH (ref 4.0–10.5)
nRBC: 0.2 % (ref 0.0–0.2)

## 2020-04-30 LAB — COMPREHENSIVE METABOLIC PANEL
ALT: 52 U/L — ABNORMAL HIGH (ref 0–44)
AST: 23 U/L (ref 15–41)
Albumin: 2.1 g/dL — ABNORMAL LOW (ref 3.5–5.0)
Alkaline Phosphatase: 47 U/L (ref 38–126)
Anion gap: 12 (ref 5–15)
BUN: 33 mg/dL — ABNORMAL HIGH (ref 6–20)
CO2: 34 mmol/L — ABNORMAL HIGH (ref 22–32)
Calcium: 7.8 mg/dL — ABNORMAL LOW (ref 8.9–10.3)
Chloride: 95 mmol/L — ABNORMAL LOW (ref 98–111)
Creatinine, Ser: 0.5 mg/dL (ref 0.44–1.00)
GFR calc Af Amer: >60 mL/min (ref >60)
GFR calc non Af Amer: >60 mL/min (ref >60)
Glucose, Bld: 196 mg/dL — ABNORMAL HIGH (ref 70–99)
Potassium: 3.5 mmol/L (ref 3.5–5.1)
Sodium: 141 mmol/L (ref 135–145)
Total Bilirubin: 1 mg/dL (ref 0.3–1.2)
Total Protein: 4.8 g/dL — ABNORMAL LOW (ref 6.5–8.1)

## 2020-04-30 LAB — GLUCOSE, CAPILLARY
Glucose-Capillary: 199 mg/dL — ABNORMAL HIGH (ref 70–99)
Glucose-Capillary: 235 mg/dL — ABNORMAL HIGH (ref 70–99)
Glucose-Capillary: 227 mg/dL — ABNORMAL HIGH (ref 70–99)
Glucose-Capillary: 232 mg/dL — ABNORMAL HIGH (ref 70–99)
Glucose-Capillary: 217 mg/dL — ABNORMAL HIGH (ref 70–99)
Glucose-Capillary: 179 mg/dL — ABNORMAL HIGH (ref 70–99)

## 2020-04-30 LAB — URINE CULTURE: Culture: >=100000 — AB

## 2020-04-30 LAB — PHOSPHORUS: Phosphorus: 3.3 mg/dL (ref 2.5–4.6)

## 2020-04-30 LAB — MAGNESIUM: Magnesium: 2.4 mg/dL (ref 1.7–2.4)

## 2020-04-30 LAB — PROCALCITONIN: Procalcitonin: <0.1 ng/mL

## 2020-04-30 MED ORDER — VITAL 1.5 CAL PO LIQD
1000.0000 mL | ORAL | Status: DC
  Administered 2020-04-30 – 2020-05-08 (×9): 1000 mL
  Filled 2020-04-30 (×21): qty 1000

## 2020-04-30 MED ORDER — INSULIN ASPART 100 UNIT/ML ~~LOC~~ SOLN
8.0000 [IU] | SUBCUTANEOUS | Status: DC
  Administered 2020-04-30 – 2020-05-07 (×38): 8 [IU] via SUBCUTANEOUS

## 2020-04-30 MED ORDER — QUETIAPINE FUMARATE 100 MG PO TABS
100.0000 mg | ORAL_TABLET | Freq: Two times a day (BID) | ORAL | Status: DC
  Administered 2020-04-30 – 2020-05-04 (×9): 100 mg
  Filled 2020-04-30 (×9): qty 1

## 2020-04-30 MED ORDER — SORBITOL 70 % SOLN
960.0000 mL | TOPICAL_OIL | Freq: Once | ORAL | Status: AC
  Administered 2020-04-30: 960 mL via RECTAL
  Filled 2020-04-30: qty 473

## 2020-04-30 MED ORDER — CLONAZEPAM 1 MG PO TABS
2.0000 mg | ORAL_TABLET | Freq: Two times a day (BID) | ORAL | Status: DC
  Administered 2020-04-30 – 2020-05-02 (×6): 2 mg
  Filled 2020-04-30 (×6): qty 2

## 2020-04-30 MED ORDER — PROSOURCE TF PO LIQD
45.0000 mL | Freq: Three times a day (TID) | ORAL | Status: DC
  Administered 2020-04-30 – 2020-05-07 (×21): 45 mL
  Filled 2020-04-30 (×17): qty 45

## 2020-04-30 MED ORDER — FUROSEMIDE 10 MG/ML IJ SOLN
40.0000 mg | Freq: Once | INTRAMUSCULAR | Status: AC
  Administered 2020-04-30: 40 mg via INTRAVENOUS
  Filled 2020-04-30: qty 4

## 2020-04-30 MED ORDER — POTASSIUM CHLORIDE 20 MEQ/15ML (10%) PO SOLN
40.0000 meq | Freq: Once | ORAL | Status: AC
  Administered 2020-04-30: 40 meq
  Filled 2020-04-30: qty 30

## 2020-04-30 NOTE — Progress Notes (Signed)
Nutrition Follow-up  DOCUMENTATION CODES:   Morbid obesity  INTERVENTION:  - will adjust TF regimen: Vital 1.5 @ 65 ml/hr with 45 ml Prosource TF TID. - this regimen will provide 2460 kcal, 138 grams protein, and 1192 ml free water.  - free water flush, if desired, to be per CCM.   NUTRITION DIAGNOSIS:   Increased nutrient needs related to acute illness, catabolic illness (KGMWN-02 infection) as evidenced by estimated needs. -ongoing  GOAL:   Provide needs based on ASPEN/SCCM guidelines -to be met with TF regimen  MONITOR:   Vent status, TF tolerance, Labs, Weight trends, I & O's  ASSESSMENT:   54 year-old female with medical history of Crohn's disease/IBS, obesty, asthma, arthritis, sleep apnea, HLD, DM, and HTN. She was transferred to [facility] from [facility] d/t COVID-19 PNA. She is not COVID vaccinated.  Significant Events: 8/22- transfer from [facility] to [place]; was intubated and R nare NGT placed prior to transfer; TF initiation  8/23- initial RD assessment; vomiting x1 in the AM 8/24- begin cycles of 16 hours proned, 8 hours supine; small bore feeding tube placed   Patient remains intubated with small bore tube in place (post-pyloric). She is receiving Vital AF 1.2 @ 60 ml/hr which is providing 1728 kcal, 108 grams protein, and 1168 ml free water.   Patient was discussed in rounds this AM. Plan for [facility] enema as patient continues without a BM.   Weight has been stable over the past 3 days. Used weight from yesterday (123.6 kg) to re-estimate kcal need. Also updated estimated needs based on ASPEN guidelines for critically ill, obese COVID-19 patients. Will adjust TF regimen as outlined above.    Patient is currently intubated on ventilator support MV: 10.3 L/min Temp (24hrs), Avg:99.4 F (37.4 C), Min:98.4 F (36.9 C), Max:100.2 F (37.9 C) Propofol: none  Labs reviewed; CBGs: 199 and 235 mg/dl, Cl: 95 mmol/l, BUN: 33 mg/dl, Ca: 7.8 mg/dl, ALT slightly  elevated.  Medications reviewed; 1000 units vitamin D3 via NGT/day, 100 mg colace BID, sliding scale novolog, 8 units novolog every 4 hours, 19 units levemir BID, 5 mg tradjenta/day, 63.75 mg solu-medrol BID, 40 mg protonix/day, 15 ml multivitamin/day, 17 g miralax/day.  Drips; fentanyl @ 250 mcg/hr, versed @ 3 mg/hr   Diet Order:   Diet Order            Diet NPO time specified  Diet effective now                 EDUCATION NEEDS:   No education needs have been identified at this time  Skin:  Skin Assessment: Reviewed RN Assessment  Last BM:  8/20  Height:   Ht Readings from Last 1 Encounters:  04/29/20 5' 3"  (1.6 m)    Weight:   Wt Readings from Last 1 Encounters:  04/29/20 123.6 kg    Estimated Nutritional Needs:  Kcal:  2430-2632 kcal (30-32.5 kcal/kg) Protein:  >/= 131 grams Fluid:  >/= 1.8 L/day      Jarome Matin, MS, RD, LDN, CNSC Inpatient Clinical Dietitian RD pager # available in AMION  After hours/weekend pager # available in [facility]

## 2020-04-30 NOTE — Progress Notes (Signed)
eLink Physician-Brief Progress Note Patient Name: [name] DOB: 06-07-1966 MRN: [id number]   Date of Service  04/30/2020  HPI/Events of Note  Patient needs a.m. labs ordered.  eICU Interventions   a.m. labs ordered.        Julious Langlois U Aladdin Kollmann 04/30/2020, 5:00 AM

## 2020-04-30 NOTE — Progress Notes (Signed)
NAME:  [name], MRN:  [id number], DOB:  23-Nov-1965, LOS: 21 ADMISSION DATE:  04/20/2020, CONSULTATION DATE:  04/21/20 REFERRING MD:  , CHIEF COMPLAINT: short of breath  Brief History   54 yo female tested positive for COVID 19 on 8/09.  Had progressive symptoms and admitted to [facility] on 8/17.  Treated with remdesivir, prednisone and ABx for CAP.  She developed worsening hypoxia and required intubation.  She was transferred from [facility] with VDRF from [place] 19 pneumonia with ARDS.  Past Medical History  Crohn's colitis, Carpal tunnel, Osteoarthritis, DM type 2, Asthma, HTN, HLD, OSA, Morbid obesity with BMI 49.64, Depression  Significant Hospital Events   8/22 transfer from [place] on full vent support, sedation. 8/23 - started Nimbex and  cycle # prone 16h/supine 8h in pm - ARDS protocol 8/24 - - still prone. fio2 down  To 40%. Deeply sedated and paralyzed. Not on pressors. Making  Urine. CXR with severe ARDS. BIS 30s. PF ratio improved to 175 while prone 8/26>> Proning d/c'd, Nimbex d/c'd 8/27>> 40%, PEEP weaned to 5, Rate 35, BP elevated, 275 mcg Fentanyl, 4 mg Versed 8/27 - cotninues vent, 40%, peep 5.  nimbix discontinues 8/26, fent gtt, versed gtt. -> BIS. Prone d/c'd - currently supine,  P/F 160,   Making urine. No pressors, Hypertensive today again  has  needed scheduled cardizem and labetalol to achieve control.  8/28 -  increaesd fio2 needs to 60%. RN slightly weaning sedation gtt. Not on pressors. On scheduled labetalol now - 8L negative 8/30-down to 40% FiO2 with a PEEP of 8, saturating 93%, no bowel movement yet 8/31 sedated, agitated at times.  Still having some ventilator dyssynchrony when level of sedation diminished  Consults:    Procedures:  ETT 8/21 >>  RUE PICC ? date  Significant Diagnostic Tests:  Echo 04/24/2020 1. Left ventricular ejection fraction, by estimation, is 55 to 60%. The left ventricle has normal function. Left  ventricular endocardial border not optimally defined to evaluate regional wall motion. Left ventricular diastolic parameters are consistent with Grade I diastolic dysfunction (impaired relaxation). 2. Right ventricular systolic function is normal. The right ventricular size is normal. Tricuspid regurgitation signal is inadequate for assessing PA pressure. 3. The mitral valve is grossly normal. No evidence of mitral valve regurgitation. No evidence of mitral stenosis. 4. The aortic valve is grossly normal. Aortic valve regurgitation is not visualized. No aortic stenosis is present  Micro Data:  COVID 8/09 >> positive COVID 8/17 >> positive MRSA PCR 8/22 >> Blood 8/22 >>No growth Sputum 8/22 >>Normal flora Urine 8/23 - GPC xxxxxx Trach aspirate 8/29 Blood  8/29 Urine 8/29  Antimicrobials:  Remdesivir 8/17 >> Steroids 8/17 >> 8/22 ->  (10 days) No Immune modulator eg Acterma (hx of crohns) xxxxxx vanc 8/29 Cefepime 8/29   Interim history/subjective:   No significant changes overnight Objective   Blood pressure (Abnormal) 179/66, pulse 68, temperature 98.6 F (37 C), resp. rate (Abnormal) 34, height 5' 3"  (1.6 m), weight 123.6 kg, SpO2 100 %.    Vent Mode: PRVC FiO2 (%):  [40 %] 40 % Set Rate:  [35 bmp] 35 bmp Vt Set:  [310 mL] 310 mL PEEP:  [5 cmH20-8 cmH20] 5 cmH20 Plateau Pressure:  [20 cmH20-21 cmH20] 20 cmH20   Intake/Output Summary (Last 24 hours) at 04/30/2020 1209 Last data filed at 04/30/2020 1000 Gross per 24 hour  Intake 4732.36 ml  Output 1725 ml  Net 3007.36 ml  Filed Weights   04/27/20 0419 04/28/20 0500 04/29/20 0500  Weight: 123.4 kg 122.3 kg 123.6 kg    Examination: General this is a obese 54 year old white female currently sedated on the mechanical ventilator HEENT normocephalic atraumatic does have some mild scleral edema, orally intubated mucous membranes moist Pulmonary some scattered rhonchi equal chest rise currently on PEEP 5/FiO2 40%  Plateau pressures are 18 Cardiac regular rate and rhythm without murmur rub or gallop Abdomen soft nontender tolerating tube feeds Extremities are warm and dry with brisk capillary refill trace edema Neuro awake, not following commands, wiggling her legs in the bed, at times tachypneic but generalized weakness GU concentrated yellow urine  Resolved Hospital Problem list   High TGL - holding diprivan  Assessment & Plan:   Acute hypoxic/hypercapnic respiratory failure with ARDS from COVID 19 pneumonia. S/p nimbex ending 8/26 Hx of asthma, obstructive sleep apnea. PCXR personally reviewed: left > right airspace disease.  Can't exclude element of effusion Plan Cont full vent support Cont brovana/albuterol  Complete 10d solumedrol  PAD protocol RASS goal -2 to -3 VAP bundle Day 3 vanc/Cefepime->await culture data and narrow as able Lasix x1  Staph epidermis UTI Plan Cont current abx for now. Can likely narrow once we get sensitivities from sputum  Dc foley  Acute metabolic encephalopathy Plan Cont supportive care RASS goal as above Adding Seroquel and clonazepam to facilitate weaning off from IV drips  Fluid electrolyte balance: Hypokalemia Plan Replace  H/o Chron's; Normally on immunosuppressants Plan Holding for now but will need wean steroids eventually back to the 108m/d dose  Holding mercaptopurine and mesalamine   Hypertension Plan Cont current rx  Type 2 diabetes Plan Cont ssi and levemir    Best practice:  Diet: tube feeds DVT prophylaxis: enoxaparin GI prophylaxis: protonix  Mobility: bed Code Status: full  Disposition: ICU  Family:   - 8/23 - Husband NJoda [phone]- updated in detail and sister-law MSharyn Lull(RT) x 2  My critical care x34 minutes  PErick ColaceACNP-BC LInvernessPager # [phone] # [phone] no answer

## 2020-05-01 ENCOUNTER — Inpatient Hospital Stay (HOSPITAL_COMMUNITY): Payer: BC Managed Care – PPO

## 2020-05-01 LAB — COMPREHENSIVE METABOLIC PANEL
ALT: 55 U/L — ABNORMAL HIGH (ref 0–44)
AST: 17 U/L (ref 15–41)
Albumin: 2.2 g/dL — ABNORMAL LOW (ref 3.5–5.0)
Alkaline Phosphatase: 50 U/L (ref 38–126)
Anion gap: 8 (ref 5–15)
BUN: 45 mg/dL — ABNORMAL HIGH (ref 6–20)
CO2: 31 mmol/L (ref 22–32)
Calcium: 7.1 mg/dL — ABNORMAL LOW (ref 8.9–10.3)
Chloride: 101 mmol/L (ref 98–111)
Creatinine, Ser: 0.71 mg/dL (ref 0.44–1.00)
GFR calc Af Amer: >60 mL/min (ref >60)
GFR calc non Af Amer: >60 mL/min (ref >60)
Glucose, Bld: 228 mg/dL — ABNORMAL HIGH (ref 70–99)
Potassium: 3.3 mmol/L — ABNORMAL LOW (ref 3.5–5.1)
Sodium: 140 mmol/L (ref 135–145)
Total Bilirubin: 0.9 mg/dL (ref 0.3–1.2)
Total Protein: 4.7 g/dL — ABNORMAL LOW (ref 6.5–8.1)

## 2020-05-01 LAB — GLUCOSE, CAPILLARY
Glucose-Capillary: 228 mg/dL — ABNORMAL HIGH (ref 70–99)
Glucose-Capillary: 194 mg/dL — ABNORMAL HIGH (ref 70–99)
Glucose-Capillary: 173 mg/dL — ABNORMAL HIGH (ref 70–99)
Glucose-Capillary: 143 mg/dL — ABNORMAL HIGH (ref 70–99)
Glucose-Capillary: 156 mg/dL — ABNORMAL HIGH (ref 70–99)

## 2020-05-01 LAB — CBC
HCT: 38.1 % (ref 36.0–46.0)
Hemoglobin: 12.6 g/dL (ref 12.0–15.0)
MCH: 33.4 pg (ref 26.0–34.0)
MCHC: 33.1 g/dL (ref 30.0–36.0)
MCV: 101.1 fL — ABNORMAL HIGH (ref 80.0–100.0)
Platelets: 149 10*3/uL — ABNORMAL LOW (ref 150–400)
RBC: 3.77 MIL/uL — ABNORMAL LOW (ref 3.87–5.11)
RDW: 11.7 % (ref 11.5–15.5)
WBC: 17.3 10*3/uL — ABNORMAL HIGH (ref 4.0–10.5)
nRBC: 0.2 % (ref 0.0–0.2)

## 2020-05-01 LAB — PHOSPHORUS: Phosphorus: 3.8 mg/dL (ref 2.5–4.6)

## 2020-05-01 LAB — CULTURE, RESPIRATORY W GRAM STAIN: Culture: NORMAL

## 2020-05-01 MED ORDER — PREDNISONE 20 MG PO TABS
20.0000 mg | ORAL_TABLET | Freq: Every day | ORAL | Status: DC
  Administered 2020-05-01 – 2020-05-07 (×7): 20 mg via NASOGASTRIC
  Filled 2020-05-01 (×7): qty 1

## 2020-05-01 MED ORDER — FUROSEMIDE 10 MG/ML IJ SOLN
40.0000 mg | Freq: Once | INTRAMUSCULAR | Status: AC
  Administered 2020-05-01: 40 mg via INTRAVENOUS
  Filled 2020-05-01: qty 4

## 2020-05-01 MED ORDER — POTASSIUM CHLORIDE 20 MEQ/15ML (10%) PO SOLN
20.0000 meq | ORAL | Status: AC
  Administered 2020-05-01 (×2): 20 meq
  Filled 2020-05-01 (×2): qty 15

## 2020-05-01 MED ORDER — POTASSIUM CHLORIDE 10 MEQ/50ML IV SOLN
10.0000 meq | INTRAVENOUS | Status: AC
  Administered 2020-05-01 (×4): 10 meq via INTRAVENOUS
  Filled 2020-05-01 (×3): qty 50

## 2020-05-01 NOTE — Progress Notes (Signed)
K+3.3 ?Replaced per protocol  ?

## 2020-05-01 NOTE — Progress Notes (Signed)
Pharmacy Antibiotic Note  [name] is a 54 y.o. female admitted on 04/20/2020 with COVID-19 pneumonia.  Pharmacy has been consulted for Cefepime dosing for possible VAP.  05/01/2020  Abx D#4 Tm 99.3, WBC up to 17.3  (last dose of 10 day solumedrol 8/31) , SCr 0.71  Plan: Cefepime 2g IV q8h   Height: 5' 3"  (160 cm) Weight: 127.5 kg (281 lb 1.4 oz) IBW/kg (Calculated) : 52.4  Temp (24hrs), Avg:98.4 F (36.9 C), Min:97.8 F (36.6 C), Max:99.3 F (37.4 C)  Recent Labs  Lab 04/25/20 0344 04/25/20 0344 04/26/20 0428 04/26/20 0500 04/27/20 0437 04/28/20 0508 04/28/20 1620 04/30/20 0400 05/01/20 0345  WBC 7.1   < > 7.5  --  7.6 9.2  --  11.8* 17.3*  CREATININE 0.60   < > 0.62  --  0.48 0.49  --  0.50 0.71  LATICACIDVEN 2.1*  --   --  2.4* 2.1* 2.7* 2.1*  --   --    < > = values in this interval not displayed.    Estimated Creatinine Clearance: 105.8 mL/min (by C-G formula based on SCr of 0.71 mg/dL).    Allergies  Allergen Reactions  . Remicade [Infliximab] Swelling    Joints locked up  . Cefprozil Rash  . Penicillins Rash    Did it involve swelling of the face/tongue/throat, SOB, or low BP? No Did it involve sudden or severe rash/hives, skin peeling, or any reaction on the inside of your mouth or nose? No Did you need to seek medical attention at a hospital or doctor's office? No When did it last happen? If all above answers are "NO", may proceed with cephalosporin use.     Antimicrobials this admission: Remdesivir 8/17-8/21 at [facility] 8/29 Vancomycin >>9/1 8/29 Cefepime >>  Dose adjustments this admission:  Microbiology results: 8/9 COVID+ 8/17 COVID+ 8/22 BCx: NGF 8/22 Sputum: normal flora F 8/23 UCx: 90k Staph epidermidis (suspect contaminant, obtained from old foley) 8/26 MRSA PCR: neg 8/29 BCx: ngtd 8/29 UCx: >100k Staph epidermidis - R cipro, clinda, septra, sens to gent, NTF, oxacillin, TCN, vanc. Contaminant per [place] note  9/1 8/29 Trach asp: normal flora F  Thank you for allowing pharmacy to be a part of this patient's care.  Eudelia Bunch, Pharm.D 05/01/2020 2:00 PM

## 2020-05-01 NOTE — Progress Notes (Signed)
NAME:  [name], MRN:  [id number], DOB:  07-Apr-1966, LOS: 27 ADMISSION DATE:  04/20/2020, CONSULTATION DATE:  04/21/20 REFERRING MD:  , CHIEF COMPLAINT: short of breath  Brief History   54 yo female tested positive for COVID 19 on 8/09.  Had progressive symptoms and admitted to [facility] on 8/17.  Treated with remdesivir, prednisone and ABx for CAP.  She developed worsening hypoxia and required intubation.  She was transferred from [facility] with VDRF from [place] 19 pneumonia with ARDS.  Past Medical History  Crohn's colitis, Carpal tunnel, Osteoarthritis, DM type 2, Asthma, HTN, HLD, OSA, Morbid obesity with BMI 49.64, Depression  Significant Hospital Events   8/22 transfer from [place] on full vent support, sedation. 8/23 - started Nimbex and  cycle # prone 16h/supine 8h in pm - ARDS protocol 8/24 - - still prone. fio2 down  To 40%. Deeply sedated and paralyzed. Not on pressors. Making  Urine. CXR with severe ARDS. BIS 30s. PF ratio improved to 175 while prone 8/26>> Proning d/c'd, Nimbex d/c'd 8/27>> 40%, PEEP weaned to 5, Rate 35, BP elevated, 275 mcg Fentanyl, 4 mg Versed 8/27 - cotninues vent, 40%, peep 5.  nimbix discontinues 8/26, fent gtt, versed gtt. -> BIS. Prone d/c'd - currently supine,  P/F 160,   Making urine. No pressors, Hypertensive today again  has  needed scheduled cardizem and labetalol to achieve control.  8/28 -  increaesd fio2 needs to 60%. RN slightly weaning sedation gtt. Not on pressors. On scheduled labetalol now - 8L negative 8/30-down to 40% FiO2 with a PEEP of 8, saturating 93%, no bowel movement yet 8/31 sedated, agitated at times.  Still having some ventilator dyssynchrony when level of sedation diminished 9/1 currently weaning  Consults:    Procedures:  ETT 8/21 >>  RUE PICC ? date  Significant Diagnostic Tests:  Echo 04/24/2020 1. Left ventricular ejection fraction, by estimation, is 55 to 60%. The left ventricle has  normal function. Left ventricular endocardial border not optimally defined to evaluate regional wall motion. Left ventricular diastolic parameters are consistent with Grade I diastolic dysfunction (impaired relaxation). 2. Right ventricular systolic function is normal. The right ventricular size is normal. Tricuspid regurgitation signal is inadequate for assessing PA pressure. 3. The mitral valve is grossly normal. No evidence of mitral valve regurgitation. No evidence of mitral stenosis. 4. The aortic valve is grossly normal. Aortic valve regurgitation is not visualized. No aortic stenosis is present  Micro Data:  COVID 8/09 >> positive COVID 8/17 >> positive MRSA PCR 8/22 >> Blood 8/22 >>No growth Sputum 8/22 >>Normal flora Urine 8/23 - GPC Trach aspirate 8/29 Blood  8/29 Urine 8/29  Antimicrobials:  Remdesivir 8/17 >> Steroids 8/17 >> 8/22 ->  (10 days) No Immune modulator eg Acterma (hx of crohns) vanc 8/29 Cefepime 8/29   Interim history/subjective:   No significant changes overnight Appears to be weaning well at present Objective   Blood pressure (!) 182/78, pulse 89, temperature 99.3 F (37.4 C), temperature source Oral, resp. rate (!) 23, height 5' 3"  (1.6 m), weight 127.5 kg, SpO2 92 %.    Vent Mode: PSV;CPAP FiO2 (%):  [30 %-40 %] 40 % Set Rate:  [35 bmp] 35 bmp Vt Set:  [310 mL] 310 mL PEEP:  [5 cmH20] 5 cmH20 Pressure Support:  [10 cmH20] 10 cmH20 Plateau Pressure:  [15 cmH20-18 cmH20] 16 cmH20   Intake/Output Summary (Last 24 hours) at 05/01/2020 1227 Last data filed at 05/01/2020  0800 Gross per 24 hour  Intake 1772.63 ml  Output 860 ml  Net 912.63 ml   Filed Weights   04/28/20 0500 04/29/20 0500 05/01/20 0354  Weight: 122.3 kg 123.6 kg 127.5 kg    Examination: General: Appears comfortable on ventilator HEENT: Moist oral mucosa Pulmonary : Bilateral rhonchi Cardiac: S1-S2 appreciated Abdomen soft nontender tolerating tube feeds Extremities:  Trace edema Neuro awake: Was able to squeeze respiratory therapist fingers this morning GU concentrated yellow urine  Resolved Hospital Problem list   High TGL - holding diprivan  Assessment & Plan:  Acute hypoxic/hypercapnic respiratory failure with ARDS from COVID-19 pneumonia Status post proning/paralytic use History of asthma/obstructive sleep apnea  -Continue full vent support -Continue bronchodilators -VAP bundle in place -De-escalate antibiotics, stop vancomycin  Staph epidermidis in urine likely contamination -No other culture has been positive at present  Acute metabolic encephalopathy -Added Seroquel and clonazepam yesterday  Hypokalemia -Being repleted  History of Crohn's -Immunosuppressants on hold  Hypertension Diabetes -Continue SSI -Continue Levemir  Continue aggressive weaning Best practice:  Diet: tube feeds DVT prophylaxis: enoxaparin GI prophylaxis: protonix  Mobility: bed Code Status: full  Disposition: ICU  Family:  Will update family  The patient is critically ill with multiple organ systems failure and requires high complexity decision making for assessment and support, frequent evaluation and titration of therapies, application of advanced monitoring technologies and extensive interpretation of multiple databases. Critical Care Time devoted to patient care services described in this note independent of APP/resident time (if applicable)  is 32 minutes.   Sherrilyn Rist MD [facility] Personal pager: [phone] If unanswered, please page CCM On-call: [phone]

## 2020-05-01 NOTE — TOC Progression Note (Signed)
Transition of Care [facility]) - Progression Note    Patient Details  Name: [name] MRN: [id number] Date of Birth: 07-28-1966  Transition of Care [facility]) CM/SW Contact  Leeroy Cha, RN Phone Number: 05/01/2020, 8:18 AM  Clinical Narrative:    covid positive unvaccinated female, remains vent dependent, dysynchrony when iv sedation decreased. Iv abcx and iv sedation, tube feeds, iv kcl 10-Central line. Plan tbd may need ltach due to vent time.  Expected Discharge Plan: [place] (from [place]) Barriers to Discharge: Continued Medical Work up  Expected Discharge Plan and Services Expected Discharge Plan: [place] (from [place])   Discharge Planning Services: CM Consult   Living arrangements for the past 2 months: [place]                                       Social Determinants of Health (SDOH) Interventions    Readmission Risk Interventions No flowsheet data found.

## 2020-05-02 ENCOUNTER — Inpatient Hospital Stay (HOSPITAL_COMMUNITY): Payer: BC Managed Care – PPO

## 2020-05-02 LAB — GLUCOSE, CAPILLARY
Glucose-Capillary: 178 mg/dL — ABNORMAL HIGH (ref 70–99)
Glucose-Capillary: 188 mg/dL — ABNORMAL HIGH (ref 70–99)
Glucose-Capillary: 198 mg/dL — ABNORMAL HIGH (ref 70–99)
Glucose-Capillary: 243 mg/dL — ABNORMAL HIGH (ref 70–99)
Glucose-Capillary: 198 mg/dL — ABNORMAL HIGH (ref 70–99)
Glucose-Capillary: 262 mg/dL — ABNORMAL HIGH (ref 70–99)

## 2020-05-02 LAB — BLOOD GAS, ARTERIAL
Acid-Base Excess: 3.2 mmol/L — ABNORMAL HIGH (ref 0.0–2.0)
Bicarbonate: 28.9 mmol/L — ABNORMAL HIGH (ref 20.0–28.0)
Drawn by: 29503
FIO2: 40
O2 Saturation: 89.2 %
PEEP: 5 cmH2O
Patient temperature: 98.6
Pressure support: 10 cmH2O
pCO2 arterial: 50.8 mmHg — ABNORMAL HIGH (ref 32.0–48.0)
pH, Arterial: 7.374 (ref 7.350–7.450)
pO2, Arterial: 60.6 mmHg — ABNORMAL LOW (ref 83.0–108.0)

## 2020-05-02 LAB — BASIC METABOLIC PANEL
Anion gap: 10 (ref 5–15)
BUN: 75 mg/dL — ABNORMAL HIGH (ref 6–20)
CO2: 29 mmol/L (ref 22–32)
Calcium: 7.6 mg/dL — ABNORMAL LOW (ref 8.9–10.3)
Chloride: 105 mmol/L (ref 98–111)
Creatinine, Ser: 1.81 mg/dL — ABNORMAL HIGH (ref 0.44–1.00)
GFR calc Af Amer: 36 mL/min — ABNORMAL LOW (ref >60)
GFR calc non Af Amer: 31 mL/min — ABNORMAL LOW (ref >60)
Glucose, Bld: 202 mg/dL — ABNORMAL HIGH (ref 70–99)
Potassium: 4.4 mmol/L (ref 3.5–5.1)
Sodium: 144 mmol/L (ref 135–145)

## 2020-05-02 LAB — MAGNESIUM: Magnesium: 2.6 mg/dL — ABNORMAL HIGH (ref 1.7–2.4)

## 2020-05-02 MED ORDER — PROPOFOL 1000 MG/100ML IV EMUL: 0.0000 ug/kg/min | INTRAVENOUS | Status: DC

## 2020-05-02 MED ORDER — POLYETHYLENE GLYCOL 3350 17 G PO PACK: 17.0000 g | PACK | Freq: Every day | ORAL | Status: DC

## 2020-05-02 MED ORDER — FENTANYL CITRATE (PF) 100 MCG/2ML IJ SOLN: 50.0000 ug | INTRAMUSCULAR | Status: DC | PRN

## 2020-05-02 MED ORDER — DOCUSATE SODIUM 50 MG/5ML PO LIQD: 100.0000 mg | Freq: Two times a day (BID) | ORAL | Status: DC

## 2020-05-02 NOTE — Progress Notes (Signed)
NAME:  [name], MRN:  [id number], DOB:  1966/08/16, LOS: 12 ADMISSION DATE:  04/20/2020, CONSULTATION DATE:  04/21/20 REFERRING MD:  , CHIEF COMPLAINT: short of breath  Brief History   54 yo female tested positive for COVID 19 on 8/09.  Had progressive symptoms and admitted to [facility] on 8/17.  Treated with remdesivir, prednisone and ABx for CAP.  She developed worsening hypoxia and required intubation.  She was transferred from [facility] with VDRF from [place] 19 pneumonia with ARDS.  Past Medical History  Crohn's colitis, Carpal tunnel, Osteoarthritis, DM type 2, Asthma, HTN, HLD, OSA, Morbid obesity with BMI 49.64, Depression  Significant Hospital Events   8/22 transfer from [place] on full vent support, sedation. 8/23 - started Nimbex and  cycle # prone 16h/supine 8h in pm - ARDS protocol 8/24 - - still prone. fio2 down  To 40%. Deeply sedated and paralyzed. Not on pressors. Making  Urine. CXR with severe ARDS. BIS 30s. PF ratio improved to 175 while prone 8/26>> Proning d/c'd, Nimbex d/c'd 8/27>> 40%, PEEP weaned to 5, Rate 35, BP elevated, 275 mcg Fentanyl, 4 mg Versed 8/27 - cotninues vent, 40%, peep 5.  nimbix discontinues 8/26, fent gtt, versed gtt. -> BIS. Prone d/c'd - currently supine,  P/F 160,   Making urine. No pressors, Hypertensive today again  has  needed scheduled cardizem and labetalol to achieve control.  8/28 -  increaesd fio2 needs to 60%. RN slightly weaning sedation gtt. Not on pressors. On scheduled labetalol now - 8L negative 8/30-down to 40% FiO2 with a PEEP of 8, saturating 93%, no bowel movement yet 8/31 sedated, agitated at times.  Still having some ventilator dyssynchrony when level of sedation diminished 9/1 currently weaning 9/2 weaning, very weak  Consults:    Procedures:  ETT 8/21 >>  RUE PICC ? date  Significant Diagnostic Tests:  Echo 04/24/2020 1. Left ventricular ejection fraction, by estimation, is 55 to  60%. The left ventricle has normal function. Left ventricular endocardial border not optimally defined to evaluate regional wall motion. Left ventricular diastolic parameters are consistent with Grade I diastolic dysfunction (impaired relaxation). 2. Right ventricular systolic function is normal. The right ventricular size is normal. Tricuspid regurgitation signal is inadequate for assessing PA pressure. 3. The mitral valve is grossly normal. No evidence of mitral valve regurgitation. No evidence of mitral stenosis. 4. The aortic valve is grossly normal. Aortic valve regurgitation is not visualized. No aortic stenosis is present  Micro Data:  COVID 8/09 >> positive COVID 8/17 >> positive MRSA PCR 8/22 >> Blood 8/22 >>No growth Sputum 8/22 >>Normal flora Urine 8/23 - GPC Trach aspirate 8/29 Blood  8/29 Urine 8/29  Antimicrobials:  Remdesivir 8/17 >> Steroids 8/17 >> 8/22 ->  (10 days) No Immune modulator eg Acterma (hx of crohns) vanc 8/29 Cefepime 8/29   Interim history/subjective:   No significant changes overnight Appears to be weaning well at present Very frail  Objective   Blood pressure (!) 173/69, pulse 80, temperature 99.1 F (37.3 C), temperature source Axillary, resp. rate 18, height 5' 3"  (1.6 m), weight 127.5 kg, SpO2 97 %.    Vent Mode: PSV;CPAP FiO2 (%):  [30 %-40 %] 40 % Set Rate:  [35 bmp] 35 bmp Vt Set:  [310 mL] 310 mL PEEP:  [5 cmH20] 5 cmH20 Pressure Support:  [10 cmH20] 10 cmH20 Plateau Pressure:  [11 cmH20-18 cmH20] 18 cmH20   Intake/Output Summary (Last 24 hours) at 05/02/2020  3845 Last data filed at 05/02/2020 0800 Gross per 24 hour  Intake 2910.51 ml  Output 1175 ml  Net 1735.51 ml   Filed Weights   04/29/20 0500 05/01/20 0354 05/02/20 0500  Weight: 123.6 kg 127.5 kg 127.5 kg    Examination: General: Comfortable on the vent HEENT: Moist oral mucosa Pulmonary : Bilateral rhonchi Cardiac: S1-S2 appreciated Abdomen soft nontender  tolerating tube feeds Extremities: Trace edema Neuro awake: Will attempt to look around but very frail GU -making good urine  Resolved Hospital Problem list   High TGL - holding diprivan  Assessment & Plan:  Acute hypoxic/hypercapnic respiratory Post proning/paralytic use History of asthma/obstructive sleep apnea -Weaning on the ventilator -Continue bronchodilators -VAP bundle in place -Continue antibiotics  Staph epidermidis in urine is likely contamination -No other culture has been positive at present  Acute metabolic encephalopathy -Seroquel and clonazepam added  Acute kidney injury Fluid overload Third spacing -Hold Lasix today secondary to bump in BUN/creatinine  History of Crohn's -Immunosuppressants on hold  Hypertension Diabetes -Continue SSI -Continue Levemir  Continue aggressive weaning Profound muscle weakness may be a barrier to being able to extubate at present Will decrease sedation  Best practice:  Diet: tube feeds DVT prophylaxis: enoxaparin GI prophylaxis: protonix  Mobility: bed Code Status: full  Disposition: ICU  Family:  Will update family  The patient is critically ill with multiple organ systems failure and requires high complexity decision making for assessment and support, frequent evaluation and titration of therapies, application of advanced monitoring technologies and extensive interpretation of multiple databases. Critical Care Time devoted to patient care services described in this note independent of APP/resident time (if applicable)  is 35 minutes.   Sherrilyn Rist MD [place] [facility] Personal pager: [phone] If unanswered, please page CCM On-call: [phone]

## 2020-05-03 ENCOUNTER — Inpatient Hospital Stay (HOSPITAL_COMMUNITY): Payer: BC Managed Care – PPO

## 2020-05-03 LAB — CBC
HCT: 33.8 % — ABNORMAL LOW (ref 36.0–46.0)
Hemoglobin: 11 g/dL — ABNORMAL LOW (ref 12.0–15.0)
MCH: 32.9 pg (ref 26.0–34.0)
MCHC: 32.5 g/dL (ref 30.0–36.0)
MCV: 101.2 fL — ABNORMAL HIGH (ref 80.0–100.0)
Platelets: 89 10*3/uL — ABNORMAL LOW (ref 150–400)
RBC: 3.34 MIL/uL — ABNORMAL LOW (ref 3.87–5.11)
RDW: 13 % (ref 11.5–15.5)
WBC: 19.8 10*3/uL — ABNORMAL HIGH (ref 4.0–10.5)
nRBC: 0 % (ref 0.0–0.2)

## 2020-05-03 LAB — GLUCOSE, CAPILLARY
Glucose-Capillary: 135 mg/dL — ABNORMAL HIGH (ref 70–99)
Glucose-Capillary: 152 mg/dL — ABNORMAL HIGH (ref 70–99)
Glucose-Capillary: 172 mg/dL — ABNORMAL HIGH (ref 70–99)
Glucose-Capillary: 187 mg/dL — ABNORMAL HIGH (ref 70–99)
Glucose-Capillary: 211 mg/dL — ABNORMAL HIGH (ref 70–99)
Glucose-Capillary: 245 mg/dL — ABNORMAL HIGH (ref 70–99)
Glucose-Capillary: 253 mg/dL — ABNORMAL HIGH (ref 70–99)

## 2020-05-03 LAB — BASIC METABOLIC PANEL
Anion gap: 9 (ref 5–15)
BUN: 74 mg/dL — ABNORMAL HIGH (ref 6–20)
CO2: 25 mmol/L (ref 22–32)
Calcium: 6.2 mg/dL — CL (ref 8.9–10.3)
Chloride: 113 mmol/L — ABNORMAL HIGH (ref 98–111)
Creatinine, Ser: 1.5 mg/dL — ABNORMAL HIGH (ref 0.44–1.00)
GFR calc Af Amer: 46 mL/min — ABNORMAL LOW (ref >60)
GFR calc non Af Amer: 39 mL/min — ABNORMAL LOW (ref >60)
Glucose, Bld: 149 mg/dL — ABNORMAL HIGH (ref 70–99)
Potassium: 3.7 mmol/L (ref 3.5–5.1)
Sodium: 147 mmol/L — ABNORMAL HIGH (ref 135–145)

## 2020-05-03 LAB — BODY FLUID CELL COUNT WITH DIFFERENTIAL
Lymphs, Fluid: 1 %
Monocyte-Macrophage-Serous Fluid: 6 % — ABNORMAL LOW (ref 50–90)
Neutrophil Count, Fluid: 93 % — ABNORMAL HIGH (ref 0–25)
Total Nucleated Cell Count, Fluid: UNDETERMINED cu mm (ref 0–1000)

## 2020-05-03 LAB — CULTURE, BLOOD (ROUTINE X 2)
Culture: NO GROWTH
Culture: NO GROWTH
Special Requests: ADEQUATE
Special Requests: ADEQUATE

## 2020-05-03 MED ORDER — FENTANYL BOLUS VIA INFUSION
50.0000 ug | INTRAVENOUS | Status: DC | PRN
  Administered 2020-05-03 – 2020-05-04 (×5): 50 ug via INTRAVENOUS
  Filled 2020-05-03: qty 50

## 2020-05-03 MED ORDER — ARTIFICIAL TEARS OPHTHALMIC OINT
1.0000 "application " | TOPICAL_OINTMENT | Freq: Three times a day (TID) | OPHTHALMIC | Status: DC
  Administered 2020-05-03 – 2020-05-05 (×4): 1 via OPHTHALMIC
  Filled 2020-05-03: qty 3.5

## 2020-05-03 MED ORDER — STERILE WATER FOR INJECTION IJ SOLN
INTRAMUSCULAR | Status: AC
  Filled 2020-05-03: qty 10

## 2020-05-03 MED ORDER — ALTEPLASE 2 MG IJ SOLR
2.0000 mg | Freq: Once | INTRAMUSCULAR | Status: AC
  Administered 2020-05-03: 2 mg
  Filled 2020-05-03: qty 2

## 2020-05-03 MED ORDER — ALTEPLASE 2 MG IJ SOLR
2.0000 mg | Freq: Once | INTRAMUSCULAR | Status: AC
  Administered 2020-05-03: 2 mg

## 2020-05-03 MED ORDER — FENTANYL CITRATE (PF) 100 MCG/2ML IJ SOLN: 50.0000 ug | Freq: Once | INTRAMUSCULAR | Status: AC

## 2020-05-03 MED ORDER — VECURONIUM BROMIDE 10 MG IV SOLR
INTRAVENOUS | Status: AC
  Filled 2020-05-03: qty 10

## 2020-05-03 MED ORDER — MIDAZOLAM BOLUS VIA INFUSION
1.0000 mg | INTRAVENOUS | Status: DC | PRN
  Administered 2020-05-03: 2 mg via INTRAVENOUS
  Filled 2020-05-03: qty 2

## 2020-05-03 MED ORDER — FENTANYL 2500MCG IN NS 250ML (10MCG/ML) PREMIX INFUSION
50.0000 ug/h | INTRAVENOUS | Status: DC
  Administered 2020-05-03: 300 ug/h via INTRAVENOUS
  Administered 2020-05-04: 125 ug/h via INTRAVENOUS
  Filled 2020-05-03 (×2): qty 250

## 2020-05-03 MED ORDER — CALCIUM GLUCONATE-NACL 1-0.675 GM/50ML-% IV SOLN
1.0000 g | Freq: Once | INTRAVENOUS | Status: AC
  Administered 2020-05-03: 1000 mg via INTRAVENOUS
  Filled 2020-05-03: qty 50

## 2020-05-03 MED ORDER — CISATRACURIUM BESYLATE 20 MG/10ML IV SOLN
0.1000 mg/kg | INTRAVENOUS | Status: DC | PRN
  Administered 2020-05-03 (×4): 13 mg via INTRAVENOUS
  Filled 2020-05-03 (×4): qty 10

## 2020-05-03 MED ORDER — MIDAZOLAM 50MG/50ML (1MG/ML) PREMIX INFUSION
2.0000 mg/h | INTRAVENOUS | Status: DC
  Administered 2020-05-03: 8 mg/h via INTRAVENOUS
  Administered 2020-05-04: 2 mg/h via INTRAVENOUS
  Filled 2020-05-03 (×2): qty 50

## 2020-05-03 MED ORDER — SODIUM CHLORIDE 0.45 % IV BOLUS
250.0000 mL | Freq: Once | INTRAVENOUS | Status: AC
  Administered 2020-05-03: 250 mL via INTRAVENOUS

## 2020-05-03 MED ORDER — VECURONIUM BROMIDE 10 MG IV SOLR
0.1000 mg/kg | Freq: Once | INTRAVENOUS | Status: AC
  Administered 2020-05-03: 12.9 mg via INTRAVENOUS

## 2020-05-03 MED ORDER — SODIUM CHLORIDE 0.45 % IV SOLN: INTRAVENOUS | Status: DC

## 2020-05-03 NOTE — TOC Progression Note (Signed)
Transition of Care [facility]) - Progression Note    Patient Details  Name: [name] MRN: [id number] Date of Birth: 07/10/1966  Transition of [facility] CM/SW Contact  Leeroy Cha, RN Phone Number: 05/03/2020, 8:04 AM  Clinical Narrative:    Patient with respiratory event overnight, asynchrony with the vent, iv sedated and paralytic given,temp101.7,CXR showed rll collapse with have bronch this am. Will follow for progression and toc needs.   Expected Discharge Plan: [place] (from [place]) Barriers to Discharge: Continued Medical Work up  Expected Discharge Plan and Services Expected Discharge Plan: [place] (from [place])   Discharge Planning Services: CM Consult   Living arrangements for the past 2 months: [place]                                       Social Determinants of Health (SDOH) Interventions    Readmission Risk Interventions No flowsheet data found.

## 2020-05-03 NOTE — Progress Notes (Signed)
eLink Physician-Brief Progress Note Patient Name: [name] DOB: 09/14/1965 MRN: [id number]   Date of Service  05/03/2020  HPI/Events of Note  Patient asynchronous with ventilator post bath. Patient appears well sedated on Fentanyl and Versed IV infusions.   eICU Interventions  Plan: 1. Increase ceiling on Fentanyl IV infusion to 400 mcg/hour.  2. Vecuronium 0.1 mg/kg IV X 1 now.         Deidre Carino Cornelia Copa 05/03/2020, 2:33 AM

## 2020-05-03 NOTE — Procedures (Signed)
Bronchoscopy Procedure Note  [name]  [id number]  01/22/1966  Date:05/03/20  Time:9:43 AM   Provider Performing:Torunn Chancellor A Nikisha Fleece   Procedure(s):  Flexible Bronchoscopy [phone])  Indication(s) Atelectasis right lower lobe  Consent Spouse informed about the procedure this morning.  Anesthesia Patient on sedation   Time Out Verified patient identification, verified procedure, site/side was marked, verified correct patient position, special equipment/implants available, medications/allergies/relevant history reviewed, required imaging and test results available.   Sterile Technique Usual hand hygiene, masks, gowns, and gloves were used   Procedure Description Bronchoscope advanced through endotracheal tube and into airway.  Airways were examined down to subsegmental level with findings noted below.   Following diagnostic evaluation, BAL(s) performed in 40 cc with normal saline and return of 20 fluid  Findings: Thick mucous plugging left lower lobe basal segments   Complications/Tolerance None; patient tolerated the procedure well. Chest X-ray is not needed post procedure.   EBL None   Specimen(s) Bronchoalveolar lavage left lower lobe sent for cell count, Gram stain and cultures, fungal studies, AFB.

## 2020-05-03 NOTE — Progress Notes (Addendum)
[place] Progress Note Patient Name: [name] DOB: 1966/01/05 MRN: [id number]   Date of Service  05/03/2020  HPI/Events of Note  Ventilator asynchrony has returned post Vecuronium wearing off. CXR reveals RLL collapse.   eICU Interventions  Will order intermittent NMB with Nimbex and sedation with Fentanyl and Versed IV infusions per ICU NMB protocol order set.      Intervention Category Major Interventions: Respiratory failure - evaluation and management  [name] 05/03/2020, 4:04 AM

## 2020-05-03 NOTE — Progress Notes (Signed)
NAME:  [name], MRN:  [id number], DOB:  02/17/1966, LOS: 42 ADMISSION DATE:  04/20/2020, CONSULTATION DATE:  04/21/20 REFERRING MD:  , CHIEF COMPLAINT: short of breath  Brief History   54 yo female tested positive for COVID 19 on 8/09.  Had progressive symptoms and admitted to [facility] on 8/17.  Treated with remdesivir, prednisone and ABx for CAP.  She developed worsening hypoxia and required intubation.  She was transferred from [facility] with VDRF from [place] 19 pneumonia with ARDS.  Past Medical History  Crohn's colitis, Carpal tunnel, Osteoarthritis, DM type 2, Asthma, HTN, HLD, OSA, Morbid obesity with BMI 49.64, Depression  Significant Hospital Events   8/22 transfer from [employer] on full vent support, sedation. 8/23 - started Nimbex and  cycle # prone 16h/supine 8h in pm - ARDS protocol 8/24 - - still prone. fio2 down  To 40%. Deeply sedated and paralyzed. Not on pressors. Making  Urine. CXR with severe ARDS. BIS 30s. PF ratio improved to 175 while prone 8/26>> Proning d/c'd, Nimbex d/c'd 8/27>> 40%, PEEP weaned to 5, Rate 35, BP elevated, 275 mcg Fentanyl, 4 mg Versed 8/27 - cotninues vent, 40%, peep 5.  nimbix discontinues 8/26, fent gtt, versed gtt. -> BIS. Prone d/c'd - currently supine,  P/F 160,   Making urine. No pressors, Hypertensive today again  has  needed scheduled cardizem and labetalol to achieve control.  8/28 -  increaesd fio2 needs to 60%. RN slightly weaning sedation gtt. Not on pressors. On scheduled labetalol now - 8L negative 8/30-down to 40% FiO2 with a PEEP of 8, saturating 93%, no bowel movement yet 8/31 sedated, agitated at times.  Still having some ventilator dyssynchrony when level of sedation diminished 9/1 currently weaning 9/2 weaning, very weak 9/3 decompensated overnight now on paralytic and requiring 100% FiO2  Consults:    Procedures:  ETT 8/21 >>  RUE PICC ? date  Significant Diagnostic Tests:  Echo  04/24/2020 1. Left ventricular ejection fraction, by estimation, is 55 to 60%. The left ventricle has normal function. Left ventricular endocardial border not optimally defined to evaluate regional wall motion. Left ventricular diastolic parameters are consistent with Grade I diastolic dysfunction (impaired relaxation). 2. Right ventricular systolic function is normal. The right ventricular size is normal. Tricuspid regurgitation signal is inadequate for assessing PA pressure. 3. The mitral valve is grossly normal. No evidence of mitral valve regurgitation. No evidence of mitral stenosis. 4. The aortic valve is grossly normal. Aortic valve regurgitation is not visualized. No aortic stenosis is present  Micro Data:  COVID 8/09 >> positive COVID 8/17 >> positive MRSA PCR 8/22 >> Blood 8/22 >>No growth Sputum 8/22 >>Normal flora Urine 8/23 - GPC Trach aspirate 8/29 Blood  8/29 Urine 8/29  Antimicrobials:  Remdesivir 8/17 >> Steroids 8/17 >> 8/22 ->  (10 days) No Immune modulator eg Acterma (hx of crohns) vanc 8/29-9/1 Cefepime 8/29 >>  Interim history/subjective:   Decompensated overnight now requiring paralytics, increased FiO2 need, atelectasis right lower lobe New fever Objective   Blood pressure 138/65, pulse 87, temperature 99.8 F (37.7 C), temperature source Axillary, resp. rate (!) 35, height 5' 3"  (1.6 m), weight 129.2 kg, SpO2 100 %.    Vent Mode: PRVC FiO2 (%):  [40 %-100 %] 100 % Set Rate:  [35 bmp] 35 bmp Vt Set:  [310 mL] 310 mL PEEP:  [5 cmH20-10 cmH20] 10 cmH20 Pressure Support:  [8 cmH20-10 cmH20] 8 cmH20 Plateau Pressure:  [16 VOJ50-09  cmH20] 22 cmH20   Intake/Output Summary (Last 24 hours) at 05/03/2020 0836 Last data filed at 05/03/2020 0522 Gross per 24 hour  Intake 2335.55 ml  Output 1400 ml  Net 935.55 ml   Filed Weights   05/01/20 0354 05/02/20 0500 05/03/20 0219  Weight: 127.5 kg 127.5 kg 129.2 kg    Examination: General: Comfortable on the  vent, on paralytic HEENT: Moist oral mucosa Pulmonary : Bilateral rhonchi Cardiac: S1-S2 appreciated Abdomen soft nontender tolerating tube feeds Extremities: Trace peripheral edema Neuro: Sedated and paralyzed GU -making good urine  Post bronchoscopy this morning -Bronchoscopy significant for significant mucus in right lower lobe  Resolved Hospital Problem list   High TGL - holding diprivan  Assessment & Plan:  Acute hypoxic/hypercapnic respiratory failure Post proning/paralytic use History of asthma/obstructive sleep apnea -Overnight decompensation now requiring high FiO2's -We will continue bronchodilators -VAP bundle in place -Continue antibiotics -May need to escalate based on cultures -Follow-up bronchoscopy cultures-Gram stain and cultures, fungal stain and cultures, cell counts  Metabolic encephalopathy -On Seroquel and clonazepam -Escalated sedation overnight and she requires paralytics now  Acute kidney injury Fluid overload Third spacing -Lasix was held secondary to bump in BUN and creatinine  History of Crohn's -Immunosuppressants on hold  Hypertension Diabetes -Continue SSI -Continue Levemir  Weaning as tolerated  Tolerated bronchoscopy well Thick mucoid secretions and atelectasis may have contributed to decompensation Continue to wean aggressively  Best practice:  Diet: tube feeds DVT prophylaxis: enoxaparin GI prophylaxis: protonix  Mobility: bed Code Status: full  Disposition: ICU  Family:  Discussed with spouse this morning  The patient is critically ill with multiple organ systems failure and requires high complexity decision making for assessment and support, frequent evaluation and titration of therapies, application of advanced monitoring technologies and extensive interpretation of multiple databases. Critical Care Time devoted to patient care services described in this note independent of APP/resident time (if applicable)  is 30  minutes.   Sherrilyn Rist MD [facility] Personal pager: [phone] If unanswered, please page CCM On-call: [phone]

## 2020-05-03 NOTE — Progress Notes (Signed)
Updated spouse with events overnight  We will plan for bronchoscopy this morning has chest x-ray showing right lower lobe collapse

## 2020-05-03 NOTE — Progress Notes (Signed)
[facility] Progress Note Patient Name: [name] DOB: 10-23-1965 MRN: [id number]   Date of Service  05/03/2020  HPI/Events of Note  Respiratory therapy states the the patient look more comfortable weaning than resting on full ventilator support.   eICU Interventions  Continue weaning trials.      Intervention Category Major Interventions: Respiratory failure - evaluation and management  [name] 05/03/2020, 8:48 PM

## 2020-05-03 NOTE — Progress Notes (Signed)
RN noted 200 cc of urine out via purewick. Olalere MD notified and orders received for 250 cc bolus of 1/2 NS, 50 cc/hr 1/2 NS maintenance and in and out cath x1.

## 2020-05-03 NOTE — Progress Notes (Signed)
This RN and Sharmon Leyden RN in and out cath patient. Patient urinating around catheter and onto bed pad. Total measured output totaled to 2,300 cc plus unmeasured output. Olalere MD notified and orders received for foley catheter, placed by this RN and Sharmon Leyden RN. Additional 300 accounted for from foley.

## 2020-05-04 LAB — BASIC METABOLIC PANEL
Anion gap: 7 (ref 5–15)
BUN: 35 mg/dL — ABNORMAL HIGH (ref 6–20)
BUN: 31 mg/dL — ABNORMAL HIGH (ref 6–20)
CO2: 23 mmol/L (ref 22–32)
CO2: 31 mmol/L (ref 22–32)
Calcium: 5.4 mg/dL — CL (ref 8.9–10.3)
Calcium: 8.1 mg/dL — ABNORMAL LOW (ref 8.9–10.3)
Chloride: >130 mmol/L (ref 98–111)
Chloride: 110 mmol/L (ref 98–111)
Creatinine, Ser: 0.8 mg/dL (ref 0.44–1.00)
Creatinine, Ser: 0.35 mg/dL — ABNORMAL LOW (ref 0.44–1.00)
GFR calc Af Amer: >60 mL/min (ref >60)
GFR calc Af Amer: >60 mL/min (ref >60)
GFR calc non Af Amer: >60 mL/min (ref >60)
GFR calc non Af Amer: >60 mL/min (ref >60)
Glucose, Bld: 121 mg/dL — ABNORMAL HIGH (ref 70–99)
Glucose, Bld: 176 mg/dL — ABNORMAL HIGH (ref 70–99)
Potassium: 2.5 mmol/L — CL (ref 3.5–5.1)
Potassium: 4.1 mmol/L (ref 3.5–5.1)
Sodium: 145 mmol/L (ref 135–145)
Sodium: 148 mmol/L — ABNORMAL HIGH (ref 135–145)

## 2020-05-04 LAB — CBC WITH DIFFERENTIAL/PLATELET
Abs Immature Granulocytes: 0.77 10*3/uL — ABNORMAL HIGH (ref 0.00–0.07)
Basophils Absolute: 0 10*3/uL (ref 0.0–0.1)
Basophils Relative: 0 %
Eosinophils Absolute: 0.2 10*3/uL (ref 0.0–0.5)
Eosinophils Relative: 2 %
HCT: 23.9 % — ABNORMAL LOW (ref 36.0–46.0)
Hemoglobin: 7.2 g/dL — ABNORMAL LOW (ref 12.0–15.0)
Immature Granulocytes: 8 %
Lymphocytes Relative: 9 %
Lymphs Abs: 0.9 10*3/uL (ref 0.7–4.0)
MCH: 33.2 pg (ref 26.0–34.0)
MCHC: 30.1 g/dL (ref 30.0–36.0)
MCV: 110.1 fL — ABNORMAL HIGH (ref 80.0–100.0)
Monocytes Absolute: 0.7 10*3/uL (ref 0.1–1.0)
Monocytes Relative: 7 %
Neutro Abs: 7.3 10*3/uL (ref 1.7–7.7)
Neutrophils Relative %: 74 %
Platelets: 70 10*3/uL — ABNORMAL LOW (ref 150–400)
RBC: 2.17 MIL/uL — ABNORMAL LOW (ref 3.87–5.11)
RDW: 12.8 % (ref 11.5–15.5)
WBC: 9.8 10*3/uL (ref 4.0–10.5)
nRBC: 0 % (ref 0.0–0.2)

## 2020-05-04 LAB — GLUCOSE, CAPILLARY
Glucose-Capillary: 141 mg/dL — ABNORMAL HIGH (ref 70–99)
Glucose-Capillary: 154 mg/dL — ABNORMAL HIGH (ref 70–99)
Glucose-Capillary: 177 mg/dL — ABNORMAL HIGH (ref 70–99)
Glucose-Capillary: 185 mg/dL — ABNORMAL HIGH (ref 70–99)
Glucose-Capillary: 205 mg/dL — ABNORMAL HIGH (ref 70–99)
Glucose-Capillary: 208 mg/dL — ABNORMAL HIGH (ref 70–99)

## 2020-05-04 LAB — ACID FAST SMEAR (AFB, MYCOBACTERIA): Acid Fast Smear: NEGATIVE

## 2020-05-04 MED ORDER — LIDOCAINE HCL (CARDIAC) PF 100 MG/5ML IV SOSY
PREFILLED_SYRINGE | INTRAVENOUS | Status: AC
  Filled 2020-05-04: qty 5

## 2020-05-04 MED ORDER — FUROSEMIDE 10 MG/ML IJ SOLN
INTRAMUSCULAR | Status: AC
  Administered 2020-05-04: 40 mg
  Filled 2020-05-04: qty 4

## 2020-05-04 MED ORDER — VECURONIUM BROMIDE 10 MG IV SOLR
INTRAVENOUS | Status: AC
  Filled 2020-05-04: qty 10

## 2020-05-04 MED ORDER — CALCIUM GLUCONATE-NACL 2-0.675 GM/100ML-% IV SOLN
2.0000 g | Freq: Once | INTRAVENOUS | Status: AC
  Administered 2020-05-04: 2000 mg via INTRAVENOUS
  Filled 2020-05-04: qty 100

## 2020-05-04 MED ORDER — FENTANYL CITRATE (PF) 100 MCG/2ML IJ SOLN
INTRAMUSCULAR | Status: AC
  Filled 2020-05-04: qty 2

## 2020-05-04 MED ORDER — SUCCINYLCHOLINE CHLORIDE 200 MG/10ML IV SOSY
PREFILLED_SYRINGE | INTRAVENOUS | Status: AC
  Filled 2020-05-04: qty 10

## 2020-05-04 MED ORDER — FREE WATER
200.0000 mL | Status: DC
  Administered 2020-05-04 – 2020-05-10 (×39): 200 mL

## 2020-05-04 MED ORDER — ROCURONIUM BROMIDE 10 MG/ML (PF) SYRINGE
PREFILLED_SYRINGE | INTRAVENOUS | Status: AC
  Filled 2020-05-04: qty 10

## 2020-05-04 MED ORDER — POTASSIUM CHLORIDE 20 MEQ/15ML (10%) PO SOLN
40.0000 meq | Freq: Once | ORAL | Status: AC
  Administered 2020-05-04: 40 meq via ORAL
  Filled 2020-05-04: qty 30

## 2020-05-04 MED ORDER — ETOMIDATE 2 MG/ML IV SOLN
INTRAVENOUS | Status: AC
  Filled 2020-05-04: qty 20

## 2020-05-04 MED ORDER — POTASSIUM CHLORIDE 10 MEQ/50ML IV SOLN
10.0000 meq | INTRAVENOUS | Status: AC
  Administered 2020-05-04 (×6): 10 meq via INTRAVENOUS
  Filled 2020-05-04: qty 50

## 2020-05-04 MED ORDER — METOPROLOL TARTRATE 25 MG PO TABS
25.0000 mg | ORAL_TABLET | Freq: Two times a day (BID) | ORAL | Status: DC
  Administered 2020-05-04: 25 mg via ORAL
  Filled 2020-05-04: qty 1

## 2020-05-04 MED ORDER — STERILE WATER FOR INJECTION IJ SOLN
INTRAMUSCULAR | Status: AC
  Filled 2020-05-04: qty 10

## 2020-05-04 MED ORDER — EPINEPHRINE 1 MG/10ML IJ SOSY
PREFILLED_SYRINGE | INTRAMUSCULAR | Status: AC
  Filled 2020-05-04: qty 10

## 2020-05-04 MED ORDER — MIDAZOLAM HCL 2 MG/2ML IJ SOLN
INTRAMUSCULAR | Status: AC
  Filled 2020-05-04: qty 4

## 2020-05-04 MED ORDER — FUROSEMIDE 10 MG/ML IJ SOLN: 40.0000 mg | Freq: Once | INTRAMUSCULAR | Status: AC

## 2020-05-04 NOTE — Progress Notes (Signed)
Called in to check on patient  Febrile 101.7-received Tylenol  She is arousable Attempts to move her limbs Wiggles her toes, squeezes the hand  Saturating well Does not appear tachypneic  Looks just generally tired  BP elevated-added metoprolol, already on Cardizem

## 2020-05-04 NOTE — Progress Notes (Signed)
[facility] Progress Note Patient Name: [name] DOB: Apr 18, 1966 MRN: [id number]   Date of Service  05/04/2020  HPI/Events of Note  Nursing request for AM labs.   eICU Interventions  Will order CBC with platelets and BMP at 5 AM.      Intervention Category Major Interventions: Other:  Lysle Dingwall 05/04/2020, 4:15 AM

## 2020-05-04 NOTE — Progress Notes (Signed)
[facility] Progress Note Patient Name: [name] DOB: [birth year] MRN: [id number]   Date of Service  05/04/2020  HPI/Events of Note  K+ = 2.5, Cl- > 130, Ca++ = 5.4 which corrects to 6.84 (low) given albumin = 2.2 and Creatinine = 0.80.  eICU Interventions  Plan: 1. Replace K+ and Ca++. 2. Free water 200 mL per tube Q 4 hours.  3. Repeat BMP at 2 PM.     Intervention Category Major Interventions: Electrolyte abnormality - evaluation and management  Caid Radin Eugene 05/04/2020, 6:59 AM

## 2020-05-04 NOTE — Procedures (Signed)
Extubation Procedure Note  Patient Details:   Name: [name] DOB: 06/29/1966 MRN: [id number]   Airway Documentation:    Vent end date: 05/04/20 Vent end time: 1100   Evaluation  O2 sats: stable throughout Complications: No apparent complications Patient did tolerate procedure well. Bilateral Breath Sounds: Diminished, Rhonchi   No   Pt was extubated per MD order at 1100 to [place] 40L/100%. Pt had a positive cuff leak and was suctioned out prior to extubation. Pt had a strong productive cough post-extubation. Pt tolerated procedure well, but was not able to speak afterwards. Pt was able to follow commands such as; opening her eyes, squeezing hands, and wiggling toes. Pt was weaned down on HHFNC to 25L/60% due to high SpO2 levels. Pt saturations at this time are 100% tolerating well. No stridor heard at this time. RT will continue to monitor pt status.   Jazmon Kos A Kenslee Achorn 05/04/2020, 11:18 AM

## 2020-05-04 NOTE — Progress Notes (Signed)
NAME:  [name], MRN:  [id number], DOB:  [birth year], LOS: 18 ADMISSION DATE:  04/20/2020, CONSULTATION DATE:  04/21/20 REFERRING MD:  , CHIEF COMPLAINT: short of breath  Brief History   54 yo female tested positive for COVID 19 on 8/09.  Had progressive symptoms and admitted to [facility] on 8/17.  Treated with remdesivir, prednisone and ABx for CAP.  She developed worsening hypoxia and required intubation.  She was transferred from [facility] with VDRF from [place] 19 pneumonia with ARDS.  Past Medical History  Crohn's colitis, Carpal tunnel, Osteoarthritis, DM type 2, Asthma, HTN, HLD, OSA, Morbid obesity with BMI 49.64, Depression  Significant Hospital Events   8/22 transfer from [place] on full vent support, sedation. 8/23 - started Nimbex and  cycle # prone 16h/supine 8h in pm - ARDS protocol 8/24 - - still prone. fio2 down  To 40%. Deeply sedated and paralyzed. Not on pressors. Making  Urine. CXR with severe ARDS. BIS 30s. PF ratio improved to 175 while prone 8/26>> Proning d/c'd, Nimbex d/c'd 8/27>> 40%, PEEP weaned to 5, Rate 35, BP elevated, 275 mcg Fentanyl, 4 mg Versed 8/27 - cotninues vent, 40%, peep 5.  nimbix discontinues 8/26, fent gtt, versed gtt. -> BIS. Prone d/c'd - currently supine,  P/F 160,   Making urine. No pressors, Hypertensive today again  has  needed scheduled cardizem and labetalol to achieve control.  8/28 -  increaesd fio2 needs to 60%. RN slightly weaning sedation gtt. Not on pressors. On scheduled labetalol now - 8L negative 8/30-down to 40% FiO2 with a PEEP of 8, saturating 93%, no bowel movement yet 8/31 sedated, agitated at times.  Still having some ventilator dyssynchrony when level of sedation diminished 9/1 currently weaning 9/2 weaning, very weak 9/3 decompensated overnight now on paralytic and requiring 100% FiO2 9/3 uneventful night, on weaning mode of ventilation  Consults:    Procedures:  ETT 8/21 >>  RUE PICC  ? date  Significant Diagnostic Tests:  Echo 04/24/2020 1. Left ventricular ejection fraction, by estimation, is 55 to 60%. The left ventricle has normal function. Left ventricular endocardial border not optimally defined to evaluate regional wall motion. Left ventricular diastolic parameters are consistent with Grade I diastolic dysfunction (impaired relaxation). 2. Right ventricular systolic function is normal. The right ventricular size is normal. Tricuspid regurgitation signal is inadequate for assessing PA pressure. 3. The mitral valve is grossly normal. No evidence of mitral valve regurgitation. No evidence of mitral stenosis. 4. The aortic valve is grossly normal. Aortic valve regurgitation is not visualized. No aortic stenosis is present  Micro Data:  COVID 8/09 >> positive COVID 8/17 >> positive MRSA PCR 8/22 >> Blood 8/22 >>No growth Sputum 8/22 >>Normal flora Urine 8/23 - GPC Trach aspirate 8/29 Blood  8/29 Urine 8/29  Antimicrobials:  Remdesivir 8/17 >> Steroids 8/17 >> 8/22 ->  (10 days) No Immune modulator eg Acterma (hx of crohns) vanc 8/29-9/1 Cefepime 8/29 >>  Interim history/subjective:   Stable overnight FiO2 requirement improved Chest x-ray better  Objective   Blood pressure (!) 100/41, pulse 84, temperature 99.2 F (37.3 C), temperature source Axillary, resp. rate 17, height _0  (1.6 m), weight 126.9 kg, SpO2 100 %.    Vent Mode: CPAP;PSV FiO2 (%):  [40 %-50 %] 40 % Set Rate:  [30 bmp] 30 bmp Vt Set:  [310 mL] 310 mL PEEP:  [5 cmH20-10 cmH20] 5 cmH20 Pressure Support:  [5 cmH20-8 cmH20] 5 cmH20 Plateau Pressure:  [  23 cmH20] 23 cmH20   Intake/Output Summary (Last 24 hours) at 05/04/2020 0856 Last data filed at 05/04/2020 0521 Gross per 24 hour  Intake 2388.3 ml  Output 5000 ml  Net -2611.7 ml   Filed Weights   05/02/20 0500 05/03/20 0219 05/04/20 0500  Weight: 127.5 kg 129.2 kg 126.9 kg    Examination: General: Comfortable on the vent,  on weaning mode of ventilation, appears comfortable HEENT: Moist oral mucosa Pulmonary : Bilateral rhonchi Cardiac: S1-S2 appreciated with no murmur Abdomen: Soft, nontender Extremities: Trace peripheral edema Neuro: Sedated GU -making good urine  Post bronchoscopy 9/3 -Bronchoscopy significant for significant mucus in right lower lobe -Chest x-ray 9/3 with improvement in right lower lobe atelectasis  Resolved Hospital Problem list   High TGL - holding diprivan  Assessment & Plan:  Acute hypoxic/hypercapnic respiratory failure Post proning/paralytic use History of asthma/obstructive sleep apnea -Appears to be weaning well today -On cefepime for possible pneumonia -BAL not showing any organism at present -Continue to follow cultures from BAL  Metabolic encephalopathy -Seroquel and versed -Will cut sedation   Metabolic encephalopathy -On Seroquel and clonazepam -Escalated sedation overnight and she requires paralytics now  Acute kidney injury Fluid overload Third spacing -resume lasix  Electrolyte derangement -hyperchloremia -hypokalemia  History of Crohn's -immunosuppressants on hold  Hypertension Diabetes -Continue SSI -Continue Levemir  Weaning as tolerated  The plan will be to aggressively try and see whether she can be extubated today  Discussion started regarding need for tracheostomy if she is unable to come off the vent as today is day 13 on the vent  Replete electrolytes  Best practice:  Diet: tube feeds DVT prophylaxis: enoxaparin GI prophylaxis: protonix  Mobility: bed Code Status: full  Disposition: ICU  Family:  Discussed with spouse 9/3  The patient is critically ill with multiple organ systems failure and requires high complexity decision making for assessment and support, frequent evaluation and titration of therapies, application of advanced monitoring technologies and extensive interpretation of multiple databases. Critical Care  Time devoted to patient care services described in this note independent of APP/resident time (if applicable)  is 35 minutes.   Sherrilyn Rist MD [facility] Personal pager: [phone] If unanswered, please page CCM On-call: [phone]

## 2020-05-04 NOTE — Progress Notes (Signed)
Pharmacy Antibiotic Note  [name] is a 54 y.o. female admitted on 04/20/2020 with COVID-19 pneumonia.  Pharmacy has been consulted for Cefepime dosing for possible VAP.  05/04/2020  Abx Day #7 Tm 99.2 (101.4 on 9/2) WBC improved to wnl (on prednisone)   SCr improved 0.8, peaked at 1.81 while on lasix  Plan: Continue Cefepime 2g IV q8h Follow up BAL cultures, duration of therapy, renal function  Height: _0  (160 cm) Weight: 126.9 kg (279 lb 12.2 oz) IBW/kg (Calculated) : 52.4  Temp (24hrs), Avg:99.1 F (37.3 C), Min:98.3 F (36.8 C), Max:99.6 F (37.6 C)  Recent Labs  Lab 04/28/20 0508 04/28/20 0508 04/28/20 1620 04/30/20 0400 05/01/20 0345 05/02/20 0430 05/03/20 0523 05/03/20 1331 05/04/20 0500  WBC 9.2  --   --  11.8* 17.3*  --   --  19.8* 9.8  CREATININE 0.49   < >  --  0.50 0.71 1.81* 1.50*  --  0.80  LATICACIDVEN 2.7*  --  2.1*  --   --   --   --   --   --    < > = values in this interval not displayed.    Estimated Creatinine Clearance: 105.5 mL/min (by C-G formula based on SCr of 0.8 mg/dL).    Allergies  Allergen Reactions  . Remicade [Infliximab] Swelling    Joints locked up  . Cefprozil Rash  . Penicillins Rash    Did it involve swelling of the face/tongue/throat, SOB, or low BP? No Did it involve sudden or severe rash/hives, skin peeling, or any reaction on the inside of your mouth or nose? No Did you need to seek medical attention at a hospital or doctor's office? No When did it last happen? If all above answers are "NO", may proceed with cephalosporin use.     Antimicrobials this admission: Remdesivir 8/17-8/21 at [facility] 8/29 Vancomycin >>9/1 8/29 Cefepime >>  Dose adjustments this admission:  Microbiology results: 8/9 COVID+ 8/17 COVID+ 8/22 BCx: NGF 8/22 Sputum: normal flora F 8/23 UCx: 90k Staph epidermidis (suspect contaminant, obtained from old foley) 8/26 MRSA PCR: neg 8/29 BCx: NGF 8/29 UCx: >100k Staph  epidermidis - R cipro, clinda, septra, sens to gent, NTF, oxacillin, TCN, vanc. Suspect contaminant 8/29 Trach asp: normal flora F 9/3 BAL: - fungal - AFB  Thank you for allowing pharmacy to be a part of this patient's care.  Peggyann Juba, PharmD, BCPS Pharmacy: [phone] 05/04/2020 8:10 AM

## 2020-05-05 ENCOUNTER — Other Ambulatory Visit: Payer: Self-pay

## 2020-05-05 ENCOUNTER — Inpatient Hospital Stay (HOSPITAL_COMMUNITY): Payer: BC Managed Care – PPO

## 2020-05-05 LAB — BASIC METABOLIC PANEL
Anion gap: 8 (ref 5–15)
BUN: 28 mg/dL — ABNORMAL HIGH (ref 6–20)
CO2: 27 mmol/L (ref 22–32)
Calcium: 7.5 mg/dL — ABNORMAL LOW (ref 8.9–10.3)
Chloride: 113 mmol/L — ABNORMAL HIGH (ref 98–111)
Creatinine, Ser: <0.3 mg/dL — ABNORMAL LOW (ref 0.44–1.00)
Glucose, Bld: 137 mg/dL — ABNORMAL HIGH (ref 70–99)
Potassium: 3.3 mmol/L — ABNORMAL LOW (ref 3.5–5.1)
Sodium: 148 mmol/L — ABNORMAL HIGH (ref 135–145)

## 2020-05-05 LAB — CBC WITH DIFFERENTIAL/PLATELET
Abs Immature Granulocytes: 1.29 10*3/uL — ABNORMAL HIGH (ref 0.00–0.07)
Basophils Absolute: 0.1 10*3/uL (ref 0.0–0.1)
Basophils Relative: 1 %
Eosinophils Absolute: 0.2 10*3/uL (ref 0.0–0.5)
Eosinophils Relative: 1 %
HCT: 34.8 % — ABNORMAL LOW (ref 36.0–46.0)
Hemoglobin: 11 g/dL — ABNORMAL LOW (ref 12.0–15.0)
Immature Granulocytes: 9 %
Lymphocytes Relative: 15 %
Lymphs Abs: 2.2 10*3/uL (ref 0.7–4.0)
MCH: 33.5 pg (ref 26.0–34.0)
MCHC: 31.6 g/dL (ref 30.0–36.0)
MCV: 106.1 fL — ABNORMAL HIGH (ref 80.0–100.0)
Monocytes Absolute: 0.9 10*3/uL (ref 0.1–1.0)
Monocytes Relative: 6 %
Neutro Abs: 10 10*3/uL — ABNORMAL HIGH (ref 1.7–7.7)
Neutrophils Relative %: 68 %
Platelets: 125 10*3/uL — ABNORMAL LOW (ref 150–400)
RBC: 3.28 MIL/uL — ABNORMAL LOW (ref 3.87–5.11)
RDW: 12.8 % (ref 11.5–15.5)
WBC: 14.8 10*3/uL — ABNORMAL HIGH (ref 4.0–10.5)
nRBC: 0.3 % — ABNORMAL HIGH (ref 0.0–0.2)

## 2020-05-05 LAB — GLUCOSE, CAPILLARY
Glucose-Capillary: 121 mg/dL — ABNORMAL HIGH (ref 70–99)
Glucose-Capillary: 146 mg/dL — ABNORMAL HIGH (ref 70–99)
Glucose-Capillary: 137 mg/dL — ABNORMAL HIGH (ref 70–99)
Glucose-Capillary: 191 mg/dL — ABNORMAL HIGH (ref 70–99)
Glucose-Capillary: 146 mg/dL — ABNORMAL HIGH (ref 70–99)

## 2020-05-05 LAB — CULTURE, RESPIRATORY W GRAM STAIN: Culture: NORMAL

## 2020-05-05 MED ORDER — CHLORHEXIDINE GLUCONATE 0.12 % MT SOLN: 15.0000 mL | Freq: Two times a day (BID) | OROMUCOSAL | Status: DC

## 2020-05-05 MED ORDER — HYDRALAZINE HCL 20 MG/ML IJ SOLN
10.0000 mg | INTRAMUSCULAR | Status: DC | PRN
  Administered 2020-05-05 – 2020-05-08 (×3): 20 mg via INTRAVENOUS
  Filled 2020-05-05 (×4): qty 1

## 2020-05-05 MED ORDER — POTASSIUM CHLORIDE 20 MEQ/15ML (10%) PO SOLN
40.0000 meq | Freq: Once | ORAL | Status: AC
  Administered 2020-05-05: 40 meq via ORAL
  Filled 2020-05-05: qty 30

## 2020-05-05 MED ORDER — METOPROLOL TARTRATE 50 MG PO TABS
50.0000 mg | ORAL_TABLET | Freq: Three times a day (TID) | ORAL | Status: DC
  Administered 2020-05-05 – 2020-05-16 (×34): 50 mg
  Filled 2020-05-05: qty 2
  Filled 2020-05-05: qty 1
  Filled 2020-05-05 (×2): qty 2
  Filled 2020-05-05 (×3): qty 1
  Filled 2020-05-05: qty 2
  Filled 2020-05-05 (×3): qty 1
  Filled 2020-05-05: qty 2
  Filled 2020-05-05: qty 1
  Filled 2020-05-05: qty 2
  Filled 2020-05-05: qty 1
  Filled 2020-05-05: qty 2
  Filled 2020-05-05 (×2): qty 1
  Filled 2020-05-05: qty 2
  Filled 2020-05-05 (×2): qty 1
  Filled 2020-05-05: qty 2
  Filled 2020-05-05 (×3): qty 1
  Filled 2020-05-05 (×2): qty 2
  Filled 2020-05-05: qty 1
  Filled 2020-05-05: qty 2
  Filled 2020-05-05: qty 1
  Filled 2020-05-05: qty 2
  Filled 2020-05-05 (×5): qty 1

## 2020-05-05 MED ORDER — METOPROLOL TARTRATE 25 MG PO TABS: 50.0000 mg | ORAL_TABLET | Freq: Two times a day (BID) | ORAL | Status: DC

## 2020-05-05 MED ORDER — ORAL CARE MOUTH RINSE: 15.0000 mL | Freq: Two times a day (BID) | OROMUCOSAL | Status: DC

## 2020-05-05 MED ORDER — FUROSEMIDE 10 MG/ML PO SOLN
40.0000 mg | Freq: Once | ORAL | Status: AC
  Administered 2020-05-05: 40 mg
  Filled 2020-05-05: qty 4

## 2020-05-05 MED ORDER — ORAL CARE MOUTH RINSE
15.0000 mL | Freq: Two times a day (BID) | OROMUCOSAL | Status: DC
  Administered 2020-05-05 – 2020-05-16 (×19): 15 mL via OROMUCOSAL

## 2020-05-05 MED ORDER — LABETALOL HCL 5 MG/ML IV SOLN
10.0000 mg | INTRAVENOUS | Status: DC | PRN
  Administered 2020-05-06 – 2020-05-08 (×3): 20 mg via INTRAVENOUS
  Filled 2020-05-05 (×4): qty 4

## 2020-05-05 MED ORDER — CHLORHEXIDINE GLUCONATE 0.12 % MT SOLN
15.0000 mL | Freq: Two times a day (BID) | OROMUCOSAL | Status: DC
  Administered 2020-05-05 – 2020-05-16 (×17): 15 mL via OROMUCOSAL
  Filled 2020-05-05 (×18): qty 15

## 2020-05-05 NOTE — Progress Notes (Signed)
eLink Physician-Brief Progress Note Patient Name: DOROTHYE BERNI DOB: 01-05-1966 MRN: [id number]   Date of Service  05/05/2020  HPI/Events of Note  Patient with elevated blood pressure.  eICU Interventions  PRN Hydralazine ordered.        Jazae Gandolfi U Saleh Ulbrich 05/05/2020, 1:19 AM

## 2020-05-05 NOTE — Progress Notes (Signed)
NAME:  [name], MRN:  [id number], DOB:  [birth year], LOS: 28 ADMISSION DATE:  04/20/2020, CONSULTATION DATE:  04/21/20 REFERRING MD:  , CHIEF COMPLAINT: short of breath  Brief History   54 yo female tested positive for COVID 19 on 8/09.  Had progressive symptoms and admitted to [facility] on 8/17.  Treated with remdesivir, prednisone and ABx for CAP.  She developed worsening hypoxia and required intubation.  She was transferred from [facility] with VDRF from [place] 19 pneumonia with ARDS.  Past Medical History  Crohn's colitis, Carpal tunnel, Osteoarthritis, DM type 2, Asthma, HTN, HLD, OSA, Morbid obesity with BMI 49.64, Depression  Significant Hospital Events   8/22 transfer from [place] on full vent support, sedation. 8/23 - started Nimbex and  cycle # prone 16h/supine 8h in pm - ARDS protocol 8/24 - - still prone. fio2 down  To 40%. Deeply sedated and paralyzed. Not on pressors. Making  Urine. CXR with severe ARDS. BIS 30s. PF ratio improved to 175 while prone 8/26>> Proning d/c'd, Nimbex d/c'd 8/27>> 40%, PEEP weaned to 5, Rate 35, BP elevated, 275 mcg Fentanyl, 4 mg Versed 8/27 - cotninues vent, 40%, peep 5.  nimbix discontinues 8/26, fent gtt, versed gtt. -> BIS. Prone d/c'd - currently supine,  P/F 160,   Making urine. No pressors, Hypertensive today again  has  needed scheduled cardizem and labetalol to achieve control.  8/28 -  increaesd fio2 needs to 60%. RN slightly weaning sedation gtt. Not on pressors. On scheduled labetalol now - 8L negative 8/30-down to 40% FiO2 with a PEEP of 8, saturating 93%, no bowel movement yet 8/31 sedated, agitated at times.  Still having some ventilator dyssynchrony when level of sedation diminished 9/1 currently weaning 9/2 weaning, very weak 9/3 decompensated overnight now on paralytic and requiring 100% FiO2 9/3 uneventful night, on weaning mode of ventilation 9/5 extubated 9/4  Consults:    Procedures:  ETT  8/21 >>  RUE PICC ? date  Significant Diagnostic Tests:  Echo 04/24/2020 1. Left ventricular ejection fraction, by estimation, is 55 to 60%. The left ventricle has normal function. Left ventricular endocardial border not optimally defined to evaluate regional wall motion. Left ventricular diastolic parameters are consistent with Grade I diastolic dysfunction (impaired relaxation). 2. Right ventricular systolic function is normal. The right ventricular size is normal. Tricuspid regurgitation signal is inadequate for assessing PA pressure. 3. The mitral valve is grossly normal. No evidence of mitral valve regurgitation. No evidence of mitral stenosis. 4. The aortic valve is grossly normal. Aortic valve regurgitation is not visualized. No aortic stenosis is present  Micro Data:  COVID 8/09 >> positive COVID 8/17 >> positive MRSA PCR 8/22 >> Blood 8/22 >>No growth Sputum 8/22 >>Normal flora Urine 8/23 - GPC Trach aspirate 8/29 Blood  8/29 Urine 8/29  Antimicrobials:  Remdesivir 8/17 >> Steroids 8/17 >> 8/22 ->  (10 days) No Immune modulator eg Acterma (hx of crohns) vanc 8/29-9/1 Cefepime 8/29 >>(day 8)-stop at 10 days)  Interim history/subjective:   Stable overnight On supplemental oxygen Chest x-ray better Staying more awake, more interactive Still profoundly weak  Objective   Blood pressure (!) 184/72, pulse 100, temperature 99.1 F (37.3 C), temperature source Axillary, resp. rate (!) 26, height _0  (1.6 m), weight 126.9 kg, SpO2 100 %.    FiO2 (%):  [30 %-60 %] 30 %   Intake/Output Summary (Last 24 hours) at 05/05/2020 0910 Last data filed at 05/05/2020 0800 Gross per  24 hour  Intake 2304.34 ml  Output 5100 ml  Net -2795.66 ml   Filed Weights   05/02/20 0500 05/03/20 0219 05/04/20 0500  Weight: 127.5 kg 129.2 kg 126.9 kg    Examination: General: Comfortable off the vent  HEENT: Moist oral mucosa Pulmonary : Mild rhonchi bilaterally Cardiac: S1-S2  appreciated Abdomen: Soft, nontender, bowel sounds appreciated Extremities: Trace peripheral edema Neuro: Awake, does attempt to follow one-step commands GU -making good urine  Post bronchoscopy 9/3 -Bronchoscopy significant for significant mucus in right lower lobe -Chest x-ray 9/3 with improvement in right lower lobe atelectasis -Chest x-ray 9/5-reviewed by myself, multifocal infiltrate, stable from previous  Resolved Hospital Problem list   High TGL - holding diprivan  Assessment & Plan:  Acute hypoxic/hypercapnic respiratory failure -Successfully extubated 9/4 -Still requiring supplemental oxygen -No organism on BAL performed 9/3 -Continue aggressive support  Metabolic encephalopathy -Off sedating medications at present  Acute kidney injury -Cautious diuresis  Electrolyte derangement -Monitor hypernatremia -Monitor hyperchloremia  History of Crohn's disease -Immunosuppressants on hold  Hypertension -On Procardia, metoprolol  Diabetes -Continue SSI  Best practice:  Diet: tube feeds DVT prophylaxis: Enoxaparin GI prophylaxis: Protonix Mobility: bed Code Status: full  Disposition: ICU  The patient is critically ill with multiple organ systems failure and requires high complexity decision making for assessment and support, frequent evaluation and titration of therapies, application of advanced monitoring technologies and extensive interpretation of multiple databases. Critical Care Time devoted to patient care services described in this note independent of APP/resident time (if applicable)  is 35 minutes.   Sherrilyn Rist MD [facility] Personal pager: [phone] If unanswered, please page CCM On-call: [phone]

## 2020-05-05 NOTE — Progress Notes (Signed)
Pt placed on HFNC (salter) on 10L, pt tolerating well at a sat of 100% at this time. RT will continue to monitor.

## 2020-05-06 LAB — COMPREHENSIVE METABOLIC PANEL
ALT: 44 U/L (ref 0–44)
AST: 27 U/L (ref 15–41)
Albumin: 2.1 g/dL — ABNORMAL LOW (ref 3.5–5.0)
Alkaline Phosphatase: 45 U/L (ref 38–126)
Anion gap: 9 (ref 5–15)
BUN: 26 mg/dL — ABNORMAL HIGH (ref 6–20)
CO2: 25 mmol/L (ref 22–32)
Calcium: 7.6 mg/dL — ABNORMAL LOW (ref 8.9–10.3)
Chloride: 111 mmol/L (ref 98–111)
Creatinine, Ser: <0.3 mg/dL — ABNORMAL LOW (ref 0.44–1.00)
Glucose, Bld: 114 mg/dL — ABNORMAL HIGH (ref 70–99)
Potassium: 3.3 mmol/L — ABNORMAL LOW (ref 3.5–5.1)
Sodium: 145 mmol/L (ref 135–145)
Total Bilirubin: 0.7 mg/dL (ref 0.3–1.2)
Total Protein: 5.1 g/dL — ABNORMAL LOW (ref 6.5–8.1)

## 2020-05-06 LAB — CBC WITH DIFFERENTIAL/PLATELET
Abs Immature Granulocytes: 1.18 10*3/uL — ABNORMAL HIGH (ref 0.00–0.07)
Basophils Absolute: 0.1 10*3/uL (ref 0.0–0.1)
Basophils Relative: 1 %
Eosinophils Absolute: 0.3 10*3/uL (ref 0.0–0.5)
Eosinophils Relative: 2 %
HCT: 35.7 % — ABNORMAL LOW (ref 36.0–46.0)
Hemoglobin: 11.9 g/dL — ABNORMAL LOW (ref 12.0–15.0)
Immature Granulocytes: 8 %
Lymphocytes Relative: 15 %
Lymphs Abs: 2.3 10*3/uL (ref 0.7–4.0)
MCH: 34.1 pg — ABNORMAL HIGH (ref 26.0–34.0)
MCHC: 33.3 g/dL (ref 30.0–36.0)
MCV: 102.3 fL — ABNORMAL HIGH (ref 80.0–100.0)
Monocytes Absolute: 0.9 10*3/uL (ref 0.1–1.0)
Monocytes Relative: 6 %
Neutro Abs: 10.9 10*3/uL — ABNORMAL HIGH (ref 1.7–7.7)
Neutrophils Relative %: 68 %
Platelets: 140 10*3/uL — ABNORMAL LOW (ref 150–400)
RBC: 3.49 MIL/uL — ABNORMAL LOW (ref 3.87–5.11)
RDW: 13.1 % (ref 11.5–15.5)
WBC: 15.7 10*3/uL — ABNORMAL HIGH (ref 4.0–10.5)
nRBC: 0.3 % — ABNORMAL HIGH (ref 0.0–0.2)

## 2020-05-06 LAB — GLUCOSE, CAPILLARY
Glucose-Capillary: 148 mg/dL — ABNORMAL HIGH (ref 70–99)
Glucose-Capillary: 113 mg/dL — ABNORMAL HIGH (ref 70–99)
Glucose-Capillary: 85 mg/dL (ref 70–99)
Glucose-Capillary: 210 mg/dL — ABNORMAL HIGH (ref 70–99)
Glucose-Capillary: 129 mg/dL — ABNORMAL HIGH (ref 70–99)
Glucose-Capillary: 147 mg/dL — ABNORMAL HIGH (ref 70–99)

## 2020-05-06 LAB — MAGNESIUM: Magnesium: 2.1 mg/dL (ref 1.7–2.4)

## 2020-05-06 MED ORDER — ALPRAZOLAM 0.25 MG PO TABS
0.2500 mg | ORAL_TABLET | Freq: Two times a day (BID) | ORAL | Status: DC | PRN
  Administered 2020-05-06 – 2020-05-08 (×3): 0.25 mg via ORAL
  Filled 2020-05-06 (×3): qty 1

## 2020-05-06 MED ORDER — POTASSIUM CHLORIDE 20 MEQ/15ML (10%) PO SOLN
20.0000 meq | ORAL | Status: AC
  Administered 2020-05-06 (×2): 20 meq
  Filled 2020-05-06 (×2): qty 15

## 2020-05-06 NOTE — Progress Notes (Signed)
eLink Physician-Brief Progress Note Patient Name: [name] DOB: 05/02/1966 MRN: [id number]   Date of Service  05/06/2020  HPI/Events of Note  Patient needs a.m. labs.  eICU Interventions   a.m. labs ordered.        Kerry Kass Casara Perrier 05/06/2020, 3:34 AM

## 2020-05-06 NOTE — Evaluation (Signed)
Physical Therapy Evaluation Patient Details Name: [name] MRN: [id number] DOB: 12/23/1965 Today's Date: 05/06/2020   History of Present Illness  54 yo female tested positive for COVID 19 on 8/09.  Had progressive symptoms and admitted to [facility] on 8/17.  Treated with remdesivir, prednisone and ABx for CAP.  She developed worsening hypoxia and required intubation 04/21/20 AND  was transferred TO [facility]  with ARDS. Extubated 05/04/20.  HER:DEYCX'K colitis, Carpal tunnel, Osteoarthritis, DM type 2, Asthma, HTN, HLD, OSA, Morbid obesity with BMI 49.64, Depression  Clinical Impression  The patient is awake, does answer simple questions about self.Voice is very soft. Patient does present with bilateral foot drop, has active hip and knee strength of 2+-3, and active plantar flexion. Has weakness in  Long finger extension and wrist extension( see OT note). per MD most likely from prolonged sedation while on vent.  Patient is an excellent CIR candidate. Patient's SPO2 maintained  >90% on 8 L HFNC. RR 24-31.  Patient's bed placed in chair position for evaluation, assisting with sitting upright but does not maintain trunk control. Patient able to extend knees and flex hips  While in partial seated position.   Patient  Is slow to answer and respond and requires frequent repeated instructions.  Pt admitted with above diagnosis. Pt currently with functional limitations due to the deficits listed below (see PT Problem List). Pt will benefit from skilled PT to increase their independence and safety with mobility to allow discharge to the venue listed below.       Follow Up Recommendations CIR    Equipment Recommendations   (tba)    Recommendations for Other Services Rehab consult     Precautions / Restrictions Precautions Precautions: Fall Precaution Comments: monitor VS, has foot drop and wrist drop. Required Braces or Orthoses: Other Brace Other Brace: prevalon boots      Mobility  Bed  Mobility Overal bed mobility: Needs Assistance Bed Mobility: Supine to Sit     Supine to sit: Max assist;+2 for physical assistance;+2 for safety/equipment;HOB elevated     General bed mobility comments: bed placed in  chair position x 10 minutes. assisted  placing hands on rails to have patient assist with sitting forward. Patient required use of bed pad and 2 max to lean forward and maintain sitting without support  at back. Performed multiple times. Patient able to grip rails initially but gradually  hands slide off with decreased amintained grip.  Transfers                 General transfer comment: TBA, rec. Maxisky  Ambulation/Gait                Financial trader Rankin (Stroke Patients Only)       Balance Overall balance assessment: Needs assistance Sitting-balance support: Bilateral upper extremity supported;Feet unsupported Sitting balance-Leahy Scale: Zero                                       Pertinent Vitals/Pain Pain Assessment: Faces Faces Pain Scale: No hurt    Home Living Family/patient expects to be discharged to:: Private residence Living Arrangements: Spouse/significant other;Children Available Help at Discharge: Family Type of Home: House Home Access: Stairs to enter   Technical brewer of Steps: 2 Home Layout: One level Home Equipment: Kasandra Knudsen -  single point Additional Comments: home info from previous encounter, Patient states 2 children are at home and husband, 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    Prior Function Level of Independence: Needs assistance   Gait / Transfers Assistance Needed: unsure, patient unable  ADL's / Homemaking Assistance Needed: unsure        Hand Dominance        Extremity/Trunk Assessment   Upper Extremity Assessment Upper Extremity Assessment:  (generalized wrist drop and decr. finger extension- see OT for details)     Lower Extremity Assessment Lower Extremity Assessment: RLE deficits/detail;LLE deficits/detail RLE Deficits / Details: hip flexion 2+, knee ext 3+, active int and ext rotations, Plantar flex 2+, DORSIFLEXION 0, EVERSION 0, patient reports has LT  in foot when asked< ? reliable LLE Deficits / Details: strength nearly same as right    Cervical / Trunk Assessment Cervical / Trunk Assessment: Normal;Other exceptions Cervical / Trunk Exceptions: does not support trunk seated upright when  sitting upright with no back support  Communication   Communication: Other (comment) (vry low and soft voice.)  Cognition Arousal/Alertness: Awake/alert Behavior During Therapy: Flat affect;WFL for tasks assessed/performed Overall Cognitive Status: Impaired/Different from baseline Area of Impairment: Orientation;Attention;Memory;Following commands;Safety/judgement;Awareness                 Orientation Level: Place;Situation;Time Current Attention Level: Focused Memory: Decreased short-term memory Following Commands: Follows one step commands inconsistently;Follows one step commands with increased time Safety/Judgement: Decreased awareness of deficits Awareness: Intellectual   General Comments: frequently required repetition of directions, patien phases out, decreased concentrationm.      General Comments      Exercises     Assessment/Plan    PT Assessment Patient needs continued PT services  PT Problem List Decreased strength;Decreased cognition;Decreased range of motion;Decreased knowledge of use of DME;Obesity;Decreased activity tolerance;Decreased safety awareness;Decreased balance;Decreased knowledge of precautions;Decreased mobility;Cardiopulmonary status limiting activity;Decreased coordination       PT Treatment Interventions DME instruction;Gait training;Neuromuscular re-education;Cognitive remediation;Functional mobility training;Patient/family education;Therapeutic  activities;Therapeutic exercise;Balance training    PT Goals (Current goals can be found in the Care Plan section)  Acute Rehab PT Goals Patient Stated Goal: none stated PT Goal Formulation: Patient unable to participate in goal setting Time For Goal Achievement: 05/20/20 Potential to Achieve Goals: Good    Frequency Min 3X/week   Barriers to discharge        Co-evaluation PT/OT/SLP Co-Evaluation/Treatment: Yes Reason for Co-Treatment: Complexity of the patient's impairments (multi-system involvement);For patient/therapist safety PT goals addressed during session: Mobility/safety with mobility         AM-PAC PT "6 Clicks" Mobility  Outcome Measure Help needed turning from your back to your side while in a flat bed without using bedrails?: Total Help needed moving from lying on your back to sitting on the side of a flat bed without using bedrails?: Total Help needed moving to and from a bed to a chair (including a wheelchair)?: Total Help needed standing up from a chair using your arms (e.g., wheelchair or bedside chair)?: Total Help needed to walk in hospital room?: Total Help needed climbing 3-5 steps with a railing? : Total 6 Click Score: 6    End of Session Equipment Utilized During Treatment: Oxygen Activity Tolerance: Patient tolerated treatment well Patient left: in bed;with call bell/phone within reach;with nursing/sitter in room Nurse Communication: Mobility status PT Visit Diagnosis: Unsteadiness on feet (R26.81);Muscle weakness (generalized) (M62.81);Other symptoms and signs involving the nervous system (R29.898);Difficulty in walking, not elsewhere classified (  R26.2)    Time: 5678-8933 PT Time Calculation (min) (ACUTE ONLY): 37 min   Charges:   PT Evaluation $PT Eval Moderate Complexity: [place] Pager [phone] Office [phone]   Claretha Cooper 05/06/2020, 10:36 AM

## 2020-05-06 NOTE — Progress Notes (Signed)
Inpatient Rehab Admissions Coordinator Note:   Per therapy recommendations, pt was screened for CIR candidacy by Clemens Catholic, [place] CCC-SLP. At this time, Pt. Appears to have functional decline and is a good candidate for CIR. Will pursue order for rehab consult per protocol.  Please contact me with questions.   Clemens Catholic, [place], [place] Admissions Coordinator  [phone] ([place]) [phone] (office)

## 2020-05-06 NOTE — TOC Progression Note (Signed)
Transition of Care [facility]) - Progression Note    Patient Details  Name: [name] MRN: [id number] Date of Birth: 07/30/1966  Transition of [facility] CM/SW Contact  Leeroy Cha, RN Phone Number: 05/06/2020, 8:00 AM  Clinical Narrative:    Extubated on [phone] to 40l/hfnc now at 8l/min hfnc, iv maxipime. Plan is to return to home Following for progression of care.   Expected Discharge Plan: [place] (from [place]) Barriers to Discharge: Continued Medical Work up  Expected Discharge Plan and Services Expected Discharge Plan: [place] (from [place])   Discharge Planning Services: CM Consult   Living arrangements for the past 2 months: [place]                                       Social Determinants of Health (SDOH) Interventions    Readmission Risk Interventions No flowsheet data found.

## 2020-05-06 NOTE — Progress Notes (Addendum)
Occupational Therapy Evaluation  Patient with functional deficits listed below due to prolonged intubation resulting in significant weakness, increased distally in all 4 extremities than proximally and decreased cognition. Patient does follow commands however requires increased time and is very distractible requiring cues to redirect to task. Patient require max A x2 from chair position to pull herself forward and unable to maintain unsupported with B hands placed on bed rails. Patient is demonstrating great rehab potential, will continue to follow.     05/06/20 1200  OT Visit Information  Last OT Received On 05/06/20  Assistance Needed +2  PT/OT/SLP Co-Evaluation/Treatment Yes  Reason for Co-Treatment Complexity of the patient's impairments (multi-system involvement);Necessary to address cognition/behavior during functional activity;For patient/therapist safety;To address functional/ADL transfers  OT goals addressed during session ADL's and self-care;Strengthening/ROM  History of Present Illness 54 yo female tested positive for COVID 19 on 8/09.  Had progressive symptoms and admitted to [facility] on 8/17.  Treated with remdesivir, prednisone and ABx for CAP.  She developed worsening hypoxia and required intubation 04/21/20 AND  was transferred TO [facility]  with ARDS. Extubated 05/04/20.  KGU:RKYHC'W colitis, Carpal tunnel, Osteoarthritis, DM type 2, Asthma, HTN, HLD, OSA, Morbid obesity with BMI 49.64, Depression  Precautions  Precautions Fall  Precaution Comments monitor VS, has foot drop and wrist drop.  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  Prior Function  Level of Independence  (Unsure)  Comments patient with very soft and low voice, requiring increased time to answer questions.   Communication  Communication Other (comment) (low/soft voice)  Pain Assessment  Pain Assessment Faces  Faces Pain Scale 0  Cognition  Arousal/Alertness Awake/alert  Behavior During Therapy Flat affect  Overall Cognitive Status Impaired/Different from baseline  Area of Impairment Orientation;Attention;Memory;Following commands;Safety/judgement;Awareness  Orientation Level Disoriented to;Time;Situation  Current Attention Level Focused  Memory Decreased short-term memory  Following Commands Follows one step commands inconsistently;Follows one step commands with increased time  Safety/Judgement Decreased awareness of deficits  Awareness Intellectual  General Comments frequently required repetition of directions, patient phases out, decreased concentration   Upper Extremity Assessment  Upper Extremity Assessment Generalized weakness;RUE deficits/detail;LUE deficits/detail  RUE Deficits / Details AAROM elbow flexion WFL, AAROM shoulder flexion ~90 degrees, trace strength in wrist + digit flexion. patient weaker distally  RUE Sensation  (intact light touch)  RUE Coordination decreased fine motor;decreased gross motor  LUE Deficits / Details presents similarly to R UE weaker distally  LUE Sensation  (intact light touch)  LUE Coordination decreased fine motor;decreased gross motor  Lower Extremity Assessment  Lower Extremity Assessment Defer to PT evaluation  Cervical / Trunk Assessment  Cervical / Trunk Assessment Other exceptions  Cervical / Trunk Exceptions does not support trunk seated upright when sitting upright with no back support, body habitus   ADL  Overall ADL's  Needs assistance/impaired  Eating/Feeding NPO  Grooming Total assistance  Upper Body Bathing Total assistance  Lower Body Bathing Total assistance  Upper Body Dressing  Total  assistance  Lower Body Dressing Total assistance  Toilet Transfer Details (indicate cue type and reason) deferred, bed pan level at this time  Toileting- Clothing Manipulation and Hygiene Total assistance  General ADL Comments with patient in chair position in bed apply lotion to upper thigh and encourage patient to rub in, patient require maximal assistance to complete but does initiate reaching forward with UEs   Bed Mobility  Overal bed mobility Needs Assistance  General bed mobility comments bed placed in chair position x 10 minutes. assisted placing hands on rails to have patient assist with sitting forward. Patient required use of bed pad and max x2 to lean forward and maintain sitting without support at back. Performed multiple times. Patient able to grip rails initially but gradually hands slide off with increased difficulty maintaining grip  Transfers  General transfer comment TBA, rec. Maxisky  Balance  Overall balance assessment Needs assistance  Sitting-balance support Bilateral upper extremity supported;Feet unsupported  Sitting balance-Leahy Scale Zero  Sitting balance - Comments unable to support self in chair position without max A x2  General Comments  General comments (skin integrity, edema, etc.) VSS on 8L HFNC  Exercises  Exercises General Upper Extremity  General Exercises - Upper Extremity  Shoulder Flexion AAROM;Both;5 reps;Supine  Elbow Flexion AAROM;Both;5 reps;Supine  Elbow Extension AAROM;Both;5 reps;Supine  Wrist Flexion PROM;Both;5 reps;Supine  Wrist Extension AAROM;Both;5 reps;Supine  Digit Composite Flexion PROM;Both;5 reps;Supine  Composite Extension AAROM;Both;5 reps;Supine  OT - End of Session  Equipment Utilized During Treatment Oxygen  Activity  Tolerance Patient tolerated treatment well  Patient left in bed;with call bell/phone within reach;with bed alarm set;with nursing/sitter in room (in chair position)  Nurse Communication Mobility status  OT Assessment  OT Recommendation/Assessment Patient needs continued OT Services  OT Visit Diagnosis Muscle weakness (generalized) (M62.81);Other abnormalities of gait and mobility (R26.89);Other symptoms and signs involving cognitive function  OT Problem List Decreased strength;Decreased range of motion;Decreased activity tolerance;Impaired balance (sitting and/or standing);Decreased coordination;Decreased cognition;Decreased safety awareness;Decreased knowledge of use of DME or AE;Cardiopulmonary status limiting activity;Decreased knowledge of precautions;Obesity;Impaired UE functional use;Increased edema  OT Plan  OT Frequency (ACUTE ONLY) Min 2X/week  OT Treatment/Interventions (ACUTE ONLY) Self-care/ADL training;Therapeutic exercise;Energy conservation;DME and/or AE instruction;Therapeutic activities;Cognitive remediation/compensation;Patient/family education;Balance training  AM-PAC OT "6 Clicks" Daily Activity Outcome Measure (Version 2)  Help from another person eating meals? 1  Help from another person taking care of personal grooming? 1  Help from another person toileting, which includes using toliet, bedpan, or urinal? 1  Help from another person bathing (including washing, rinsing, drying)? 1  Help from another person to put on and taking off regular upper body clothing? 1  Help from another person to put on and taking off regular lower body clothing? 1  6 Click Score 6  OT Recommendation  Follow Up Recommendations CIR  OT Equipment Other (comment) (TBD)  Individuals Consulted  Consulted and Agree with Results and Recommendations Patient unable/family or caregiver not available  Acute Rehab OT Goals  Patient Stated Goal none stated  OT Goal Formulation Patient unable to  participate in goal setting  Time For Goal Achievement 04/16/20  Potential to Achieve Goals Good  OT Time Calculation  OT Start Time (ACUTE ONLY) 0817  OT Stop Time (ACUTE ONLY) 0855  OT Time Calculation (min) 38 min  OT General Charges  $OT Visit 1 Visit  OT Evaluation  $OT Eval Moderate Complexity 1 Mod  Written Expression  Dominant Hand  (unable to specify )   Raquel Sarna  Susy Frizzle OT OT pager: [phone]

## 2020-05-06 NOTE — Progress Notes (Signed)
eLink Physician-Brief Progress Note Patient Name: [name] DOB: 02/08/1966 MRN: [id number]   Date of Service  05/06/2020  HPI/Events of Note  Patient with uncontrolled hypertension, she is also anxious.  eICU Interventions  PRN Labetalol ordered, Xanax ordered PRN anxiety.        [place] U Ogan 05/06/2020, 12:01 AM

## 2020-05-06 NOTE — Progress Notes (Signed)
NAME:  SHADE RIVENBARK, MRN:  [id number], DOB:  07/27/1966, LOS: 63 ADMISSION DATE:  04/20/2020, CONSULTATION DATE:  04/21/20 REFERRING MD:  , CHIEF COMPLAINT: short of breath  Brief History   54 yo female tested positive for COVID 19 on 8/09.  Had progressive symptoms and admitted to [facility] on 8/17.  Treated with remdesivir, prednisone and ABx for CAP.  She developed worsening hypoxia and required intubation.  She was transferred from [facility] with VDRF from [place] 19 pneumonia with ARDS.  Past Medical History  Crohn's colitis, Carpal tunnel, Osteoarthritis, DM type 2, Asthma, HTN, HLD, OSA, Morbid obesity with BMI 49.64, Depression  Significant Hospital Events   8/22 transfer from [place] on full vent support, sedation. 8/23 - started Nimbex and  cycle # prone 16h/supine 8h in pm - ARDS protocol 8/24 - - still prone. fio2 down  To 40%. Deeply sedated and paralyzed. Not on pressors. Making  Urine. CXR with severe ARDS. BIS 30s. PF ratio improved to 175 while prone 8/26>> Proning d/c'd, Nimbex d/c'd 8/27>> 40%, PEEP weaned to 5, Rate 35, BP elevated, 275 mcg Fentanyl, 4 mg Versed 8/27 - cotninues vent, 40%, peep 5.  nimbix discontinues 8/26, fent gtt, versed gtt. -> BIS. Prone d/c'd - currently supine,  P/F 160,   Making urine. No pressors, Hypertensive today again  has  needed scheduled cardizem and labetalol to achieve control.  8/28 -  increaesd fio2 needs to 60%. RN slightly weaning sedation gtt. Not on pressors. On scheduled labetalol now - 8L negative 8/30-down to 40% FiO2 with a PEEP of 8, saturating 93%, no bowel movement yet 8/31 sedated, agitated at times.  Still having some ventilator dyssynchrony when level of sedation diminished 9/1 currently weaning 9/2 weaning, very weak 9/3 decompensated overnight now on paralytic and requiring 100% FiO2 9/3 uneventful night, on weaning mode of ventilation 9/5 extubated 9/4  Consults:    Procedures:  ETT  8/21 >>  RUE PICC ? date  Significant Diagnostic Tests:  Echo 04/24/2020 1. Left ventricular ejection fraction, by estimation, is 55 to 60%. The left ventricle has normal function. Left ventricular endocardial border not optimally defined to evaluate regional wall motion. Left ventricular diastolic parameters are consistent with Grade I diastolic dysfunction (impaired relaxation). 2. Right ventricular systolic function is normal. The right ventricular size is normal. Tricuspid regurgitation signal is inadequate for assessing PA pressure. 3. The mitral valve is grossly normal. No evidence of mitral valve regurgitation. No evidence of mitral stenosis. 4. The aortic valve is grossly normal. Aortic valve regurgitation is not visualized. No aortic stenosis is present  Micro Data:  COVID 8/09 >> positive COVID 8/17 >> positive MRSA PCR 8/22 >> Blood 8/22 >>No growth Sputum 8/22 >>Normal flora Urine 8/23 - GPC Trach aspirate 8/29 Blood  8/29 Urine 8/29  Antimicrobials:  Remdesivir 8/17 >> Steroids 8/17 >> 8/22 ->  (10 days) No Immune modulator eg Acterma (hx of crohns) vanc 8/29-9/1 Cefepime 8/29 >>(day 8)-stop at 10 days)  Interim history/subjective:   Critically ill. Remains in the ICU. WBC stable.  Stable on 8 L Salter catheter this morning  Objective   Blood pressure (!) 146/71, pulse 83, temperature 97.9 F (36.6 C), temperature source Oral, resp. rate (!) 31, height 5' 3"  (1.6 m), weight 123.6 kg, SpO2 100 %.    FiO2 (%):  [30 %] 30 %   Intake/Output Summary (Last 24 hours) at 05/06/2020 0728 Last data filed at 05/06/2020 0646 Gross per  24 hour  Intake 297.49 ml  Output 3125 ml  Net -2827.51 ml   Filed Weights   05/03/20 0219 05/04/20 0500 05/06/20 0500  Weight: 129.2 kg 126.9 kg 123.6 kg    Examination: General: Obese female, sitting up in bed work with PT this morning HEENT: Tracking appropriately Pulmonary : Clear to auscultation bilaterally no crackles no  wheeze Cardiac: Regular rate rhythm, S1-S2 Abdomen: Obese pannus, soft nontender nondistended Extremities: Bilateral lower extremity edema Neuro: Awake alert following commands weak bilateral ankle and wrist extensor weakness GU: Deferred  CXR: 9/5 - BL infiltrates. Persistent but improved. The patient's images have been independently reviewed by me.    Resolved Hospital Problem list   High TGL - holding diprivan  Assessment & Plan:   Acute hypoxic/hypercapnic respiratory failure Bilateral COVID-19 pneumonia, status post therapy -Extubated 05/04/2020 -Weaning FiO2 as tolerated -Stable on 8 L Salter catheter -Able to work with PT  Metabolic encephalopathy -Resolved  Acute kidney injury -Resolved -Continue to follow urine output -As needed diuresis  Electrolyte derangement -Continue to follow -Replete electrolytes as needed  History of Crohn's disease -Holding PTA immune suppressants  Hypertension -Procardia plus metoprolol  Diabetes -SSI plus CBG    Best practice:  Diet: tube feeds DVT prophylaxis: Enoxaparin GI prophylaxis: Protonix Mobility: bed Code Status: full  Disposition: Patient is stable for transfer out of the intensive care unit.  I have paged [facility] hospitalist for pickup tomorrow.   Garner Nash, DO [facility] 05/06/2020 7:28 AM

## 2020-05-07 DIAGNOSIS — R1310 Dysphagia, unspecified: Secondary | ICD-10-CM

## 2020-05-07 DIAGNOSIS — R5381 Other malaise: Secondary | ICD-10-CM | POA: Diagnosis present

## 2020-05-07 LAB — BASIC METABOLIC PANEL
Anion gap: 11 (ref 5–15)
BUN: 25 mg/dL — ABNORMAL HIGH (ref 6–20)
CO2: 24 mmol/L (ref 22–32)
Calcium: 7.9 mg/dL — ABNORMAL LOW (ref 8.9–10.3)
Chloride: 110 mmol/L (ref 98–111)
Creatinine, Ser: 0.33 mg/dL — ABNORMAL LOW (ref 0.44–1.00)
GFR calc Af Amer: >60 mL/min (ref >60)
GFR calc non Af Amer: >60 mL/min (ref >60)
Glucose, Bld: 121 mg/dL — ABNORMAL HIGH (ref 70–99)
Potassium: 3.6 mmol/L (ref 3.5–5.1)
Sodium: 145 mmol/L (ref 135–145)

## 2020-05-07 LAB — CBC
HCT: 33.4 % — ABNORMAL LOW (ref 36.0–46.0)
Hemoglobin: 10.8 g/dL — ABNORMAL LOW (ref 12.0–15.0)
MCH: 33.3 pg (ref 26.0–34.0)
MCHC: 32.3 g/dL (ref 30.0–36.0)
MCV: 103.1 fL — ABNORMAL HIGH (ref 80.0–100.0)
Platelets: 146 10*3/uL — ABNORMAL LOW (ref 150–400)
RBC: 3.24 MIL/uL — ABNORMAL LOW (ref 3.87–5.11)
RDW: 13.1 % (ref 11.5–15.5)
WBC: 14 10*3/uL — ABNORMAL HIGH (ref 4.0–10.5)
nRBC: 0.1 % (ref 0.0–0.2)

## 2020-05-07 LAB — GLUCOSE, CAPILLARY
Glucose-Capillary: 116 mg/dL — ABNORMAL HIGH (ref 70–99)
Glucose-Capillary: 126 mg/dL — ABNORMAL HIGH (ref 70–99)
Glucose-Capillary: 149 mg/dL — ABNORMAL HIGH (ref 70–99)
Glucose-Capillary: 152 mg/dL — ABNORMAL HIGH (ref 70–99)
Glucose-Capillary: 159 mg/dL — ABNORMAL HIGH (ref 70–99)
Glucose-Capillary: 184 mg/dL — ABNORMAL HIGH (ref 70–99)

## 2020-05-07 LAB — CYTOLOGY - NON PAP

## 2020-05-07 MED ORDER — INSULIN ASPART 100 UNIT/ML ~~LOC~~ SOLN
0.0000 [IU] | Freq: Four times a day (QID) | SUBCUTANEOUS | Status: DC
  Administered 2020-05-07 (×2): 3 [IU] via SUBCUTANEOUS
  Administered 2020-05-08: 2 [IU] via SUBCUTANEOUS
  Administered 2020-05-08: 3 [IU] via SUBCUTANEOUS
  Administered 2020-05-08: 2 [IU] via SUBCUTANEOUS
  Administered 2020-05-08 – 2020-05-09 (×2): 3 [IU] via SUBCUTANEOUS
  Administered 2020-05-09: 2 [IU] via SUBCUTANEOUS
  Administered 2020-05-10 – 2020-05-11 (×4): 3 [IU] via SUBCUTANEOUS
  Administered 2020-05-12: 2 [IU] via SUBCUTANEOUS
  Administered 2020-05-12: 3 [IU] via SUBCUTANEOUS
  Administered 2020-05-13: 2 [IU] via SUBCUTANEOUS
  Administered 2020-05-13 – 2020-05-14 (×2): 3 [IU] via SUBCUTANEOUS
  Administered 2020-05-15: 2 [IU] via SUBCUTANEOUS
  Administered 2020-05-15: 3 [IU] via SUBCUTANEOUS
  Administered 2020-05-16: 2 [IU] via SUBCUTANEOUS

## 2020-05-07 MED ORDER — PREDNISONE 5 MG PO TABS
10.0000 mg | ORAL_TABLET | Freq: Every day | ORAL | Status: DC
  Administered 2020-05-08 – 2020-05-09 (×2): 10 mg via NASOGASTRIC
  Filled 2020-05-07: qty 1
  Filled 2020-05-07: qty 2

## 2020-05-07 MED ORDER — PROSOURCE TF PO LIQD
45.0000 mL | Freq: Two times a day (BID) | ORAL | Status: DC
  Administered 2020-05-07 – 2020-05-10 (×5): 45 mL
  Filled 2020-05-07 (×8): qty 45

## 2020-05-07 MED ORDER — INSULIN DETEMIR 100 UNIT/ML ~~LOC~~ SOLN
15.0000 [IU] | Freq: Every day | SUBCUTANEOUS | Status: DC
  Administered 2020-05-07 – 2020-05-15 (×9): 15 [IU] via SUBCUTANEOUS
  Filled 2020-05-07 (×10): qty 0.15

## 2020-05-07 NOTE — Progress Notes (Signed)
Nutrition Follow-up  DOCUMENTATION CODES:   Morbid obesity  INTERVENTION:  - diet advancement as medically feasible/ - if unable to advance diet, continue Vital 1.5 @ 65 ml/hr. - will decrease Prosource TF from TID to BID.  - this regimen will provide 2420 kcal, 127 grams protein.  NUTRITION DIAGNOSIS:   Increased nutrient needs related to acute illness, catabolic illness (MMNOT-77 infection) as evidenced by estimated needs. -ongoing  GOAL:   Patient will meet greater than or equal to 90% of their needs -met with TF regimen  MONITOR:   Diet advancement, TF tolerance, Labs, Weight trends  ASSESSMENT:   54 year-old female with medical history of Crohn's disease/IBS, obesty, asthma, arthritis, sleep apnea, HLD, DM, and HTN. She was transferred to [facility] from [facility] d/t COVID-19 PNA. She is not COVID vaccinated.  Significant Events: 8/22-transfer from [facility] to [place]; was intubated and R nare NGT placed prior to transfer; TF initiation  8/23-initial RD assessment; vomiting x1 in the AM 8/24-begin cycles of 16 hours proned, 8 hours supine; small bore feeding tube placed; TF re-started at trickle rate 8/25- proning 8/31- SMOG enema 9/4- extubation   Patient remains NPO s/p extubation. Estimated nutrition needs updated based on ASPEN guidelines for COVID-19 positive patients.   Patient discussed in rounds this AM. Plan is for transfer out of ICU/SDU to med-surg today. She is to have a bedside swallow evaluation today also.   She has small bore NGT in place (post-pyloric) and is receiving ital 1.5 @ 65 ml/hr with 45 ml Prosource TF TID. Order placed starting 9/4 for 200 ml free water every 4 hours. This regimen is providing 2460 kcal, 138 grams protein, and 2392 ml free water.   Deep pitting edema to all extremities, per review of flow sheet documentation. Weight today is consistent with weight 8/27-8/30.    Labs reviewed; CBGs: 149, 116, 126 mg/dl, BUN: 25  mg/dl, creatinine: 0.33 mg/dl, Ca: 7.9 mg/dl. Medications reviewed; 1000 units cholecalciferol/day, sliding scale novolog, 15 units levemir/day, 5 mg tradjenta/day, 15 ml multivitamin/day, 10 mg deltasone/day.    Diet Order:   Diet Order            Diet NPO time specified  Diet effective now                 EDUCATION NEEDS:   No education needs have been identified at this time  Skin:  Skin Assessment: Skin Integrity Issues: Skin Integrity Issues:: Other (Comment) Other: Skin tears x2 to R thigh (9/3)  Last BM:  9/7  Height:   Ht Readings from Last 1 Encounters:  04/29/20 5' 3"  (1.6 m)    Weight:   Wt Readings from Last 1 Encounters:  05/07/20 122.6 kg     Estimated Nutritional Needs:  Kcal:  2400-2600 kcal (30-32.5 kcal/kg adj BW) Protein:  120-130 grams Fluid:  >/= 2 L/day     Jarome Matin, MS, RD, LDN, CNSC Inpatient Clinical Dietitian RD pager # available in [place]  After hours/weekend pager # available in [facility]

## 2020-05-07 NOTE — Progress Notes (Signed)
PROGRESS NOTE    [name]  OAC:[id number] DOB: 07/26/1966 DOA: 04/20/2020 PCP: Caryl Bis, MD    Brief Narrative:  54 year old female with obesity, hypertension, diabetes, Crohn's disease, unvaccinated for COVID-19 was diagnosed with Positive COVID-19 test on 8/9 8/17 admitted to [facility] on  with progressive shortness of breath where she developed ARDS, intubated  8/22 transferred to [facility] intubated and treated . 8/22-9/5, mechanical intubation, prolonged recovery. 9/6 on room air, transfer to [facility] service.  COVID 8/09 >> positive COVID 8/17 >> positive Blood 8/22 >>No growth Sputum 8/22 >>Normal flora Urine 8/23 - GPC Trach aspirate 8/29 Blood  8/29, no growth, staph epidermidis Urine 8/29, no growth  Assessment & Plan:   Principal Problem:   ARDS (adult respiratory distress syndrome) (HCC) Active Problems:   Hypertension complicating diabetes ([place])   Crohn disease (HCC)   IBS (irritable bowel syndrome)   Physical deconditioning   Dysphagia  Acute hypoxemic respiratory failure due to ARDS from COVID-19 viral infection: Mechanical intubation 8/17-9/5 Now improved to room air Continue aggressive bronchodilator therapy, chest physiotherapy and mobility. Finished remdesivir treatment. Baricitinib not used because of Crohn's disease  High-dose steroids, continue to taper off today, will start 10 mg prednisone and stop next 3 to 4 days. Severe debility, will need aggressive inpatient physical therapies. Patient agreeable to take COVID-19 vaccination before discharge from rehab. On empiric cefepime for 10 days, day 9/10.  Type 2 diabetes, steroid-induced hyperglycemia: Blood sugar stable.  Currently on tube feeding.  Decrease dose of insulin to sliding scale every 6 hours, decrease dose of Lantus to 15 units at bedtime.  We will continue to titrate according to blood sugar levels.  Avoid hypoglycemia.  Hypertension: Blood pressure stable  on metoprolol and Cardizem that she takes at home.  Will change to long-acting Cardizem once patient able to take by mouth.  Dysphagia: Related to intubation.  Currently on tube feeding.  Swallow study today.  Acute kidney injury: Resolved.  Acute metabolic encephalopathy: Improved.  Advanced physical deconditioning: Patient has developed severe physical deconditioning.  She will continue to work with PT OT.  Will benefit with inpatient therapies before returning home.  Swallow evaluation today Remove Foley catheter Remove airborne isolation, start standard isolation. 4 weeks from positive test 3 weeks from hospitalization She can come off isolation and moved to MedSurg bed.   DVT prophylaxis: SCDs Start: 04/21/20 0241   Code Status: Full code Family Communication: Husband updated. Disposition Plan: Status is: Inpatient  Remains inpatient appropriate because:Unsafe d/c plan and Inpatient level of care appropriate due to severity of illness   Dispo: The patient is from: Home              Anticipated d/c is to: CIR              Anticipated d/c date is: 1 day              Patient currently is medically stable to d/c.         Consultants:   PCCM  Procedures:   Endotracheal intubation 8/17-9/5  Antimicrobials:  Antibiotics Given (last 72 hours)    Date/Time Action Medication Dose Rate   05/04/20 1350 New Bag/Given   ceFEPIme (MAXIPIME) 2 g in sodium chloride 0.9 % 100 mL IVPB 2 g 200 mL/hr   05/04/20 2235 New Bag/Given   ceFEPIme (MAXIPIME) 2 g in sodium chloride 0.9 % 100 mL IVPB 2 g 200 mL/hr   05/05/20 0519 New  Bag/Given   ceFEPIme (MAXIPIME) 2 g in sodium chloride 0.9 % 100 mL IVPB 2 g 200 mL/hr   05/05/20 1355 New Bag/Given   ceFEPIme (MAXIPIME) 2 g in sodium chloride 0.9 % 100 mL IVPB 2 g 200 mL/hr   05/05/20 2219 New Bag/Given   ceFEPIme (MAXIPIME) 2 g in sodium chloride 0.9 % 100 mL IVPB 2 g 200 mL/hr   05/06/20 0616 New Bag/Given   ceFEPIme (MAXIPIME)  2 g in sodium chloride 0.9 % 100 mL IVPB 2 g 200 mL/hr   05/06/20 1441 New Bag/Given   ceFEPIme (MAXIPIME) 2 g in sodium chloride 0.9 % 100 mL IVPB 2 g 200 mL/hr   05/06/20 2209 New Bag/Given   ceFEPIme (MAXIPIME) 2 g in sodium chloride 0.9 % 100 mL IVPB 2 g 200 mL/hr   05/07/20 0527 New Bag/Given   ceFEPIme (MAXIPIME) 2 g in sodium chloride 0.9 % 100 mL IVPB 2 g 200 mL/hr         Subjective: Patient seen and examined in the ICU.  Last 1 month events reviewed with the patient.  She is currently on room air.  Able to take deep breaths.  Her voice is clear. She wants to take COVID-19 vaccine in the hospital before going home. No other overnight events.  Blood sugars low normal.  Tube feeding infusing.  She is hopeful to eat today.  Objective: Vitals:   05/07/20 0516 05/07/20 0700 05/07/20 0800 05/07/20 0914  BP:    (!) 163/54  Pulse:  89  89  Resp:  (!) 32    Temp: 98.4 F (36.9 C)  98.9 F (37.2 C)   TempSrc: Oral  Oral   SpO2:  90%    Weight:      Height:        Intake/Output Summary (Last 24 hours) at 05/07/2020 1042 Last data filed at 05/07/2020 0528 Gross per 24 hour  Intake 210.2 ml  Output 1345 ml  Net -1134.8 ml   Filed Weights   05/04/20 0500 05/06/20 0500 05/07/20 0500  Weight: 126.9 kg 123.6 kg 122.6 kg    Examination:  General exam: Appears calm and comfortable  Acutely sick looking but comfortable on room air. Morbidly obese lady not in any distress. Respiratory system: Clear to auscultation. Respiratory effort normal.  Poor air entry but no added sounds. Cardiovascular system: S1 & S2 heard, RRR. No JVD, murmurs, rubs, gallops or clicks. No pedal edema. Gastrointestinal system: Abdomen is nondistended, soft and nontender. No organomegaly or masses felt. Normal bowel sounds heard.  Obese and pendulous. NG tube feeding intact. Central nervous system: Alert and oriented.  Patient has severe deconditioning, muscle power loss in all 4 extremities. Upper  extremities 3/5, unable to raise against gravity. Lower extremities with bilateral foot drop, 3/5. Skin: No rashes, lesions or ulcers Psychiatry: Judgement and insight appear normal. Mood & affect appropriate.  Foley catheter with clear urine.   Data Reviewed: I have personally reviewed following labs and imaging studies  CBC: Recent Labs  Lab 05/03/20 1331 05/04/20 0500 05/05/20 0430 05/06/20 0353 05/07/20 0456  WBC 19.8* 9.8 14.8* 15.7* 14.0*  NEUTROABS  --  7.3 10.0* 10.9*  --   HGB 11.0* 7.2* 11.0* 11.9* 10.8*  HCT 33.8* 23.9* 34.8* 35.7* 33.4*  MCV 101.2* 110.1* 106.1* 102.3* 103.1*  PLT 89* 70* 125* 140* 101*   Basic Metabolic Panel: Recent Labs  Lab 05/01/20 0345 05/01/20 0345 05/02/20 0430 05/03/20 0523 05/04/20 0500 05/04/20 2140 05/05/20 0430  05/06/20 0353 05/07/20 0456  NA 140   < > 144   < > 145 148* 148* 145 145  K 3.3*   < > 4.4   < > 2.5* 4.1 3.3* 3.3* 3.6  CL 101   < > 105   < > >130* 110 113* 111 110  CO2 31   < > 29   < > _0 GLUCOSE 228*   < > 202*   < > 121* 176* 137* 114* 121*  BUN 45*   < > 75*   < > 35* 31* 28* 26* 25*  CREATININE 0.71   < > 1.81*   < > 0.80 0.35* <0.30* <0.30* 0.33*  CALCIUM 7.1*   < > 7.6*   < > 5.4* 8.1* 7.5* 7.6* 7.9*  MG  --   --  2.6*  --   --   --   --  2.1  --   PHOS 3.8  --   --   --   --   --   --   --   --    < > = values in this interval not displayed.   GFR: Estimated Creatinine Clearance: 103.4 mL/min (A) (by C-G formula based on SCr of 0.33 mg/dL (L)). Liver Function Tests: Recent Labs  Lab 05/01/20 0345 05/06/20 0353  AST 17 27  ALT 55* 44  ALKPHOS 50 45  BILITOT 0.9 0.7  PROT 4.7* 5.1*  ALBUMIN 2.2* 2.1*   No results for input(s): LIPASE, AMYLASE in the last 168 hours. No results for input(s): AMMONIA in the last 168 hours. Coagulation Profile: No results for input(s): INR, PROTIME in the last 168 hours. Cardiac Enzymes: No results for input(s): CKTOTAL, CKMB, CKMBINDEX, TROPONINI  in the last 168 hours. BNP (last 3 results) No results for input(s): PROBNP in the last 8760 hours. HbA1C: No results for input(s): HGBA1C in the last 72 hours. CBG: Recent Labs  Lab 05/06/20 1636 05/06/20 2103 05/07/20 0006 05/07/20 0456 05/07/20 0858  GLUCAP 129* 147* 149* 116* 126*   Lipid Profile: No results for input(s): CHOL, HDL, LDLCALC, TRIG, CHOLHDL, LDLDIRECT in the last 72 hours. Thyroid Function Tests: No results for input(s): TSH, T4TOTAL, FREET4, T3FREE, THYROIDAB in the last 72 hours. Anemia Panel: No results for input(s): VITAMINB12, FOLATE, FERRITIN, TIBC, IRON, RETICCTPCT in the last 72 hours. Sepsis Labs: No results for input(s): PROCALCITON, LATICACIDVEN in the last 168 hours.  Recent Results (from the past 240 hour(s))  Culture, respiratory (non-expectorated)     Status: None   Collection Time: 04/28/20 12:24 PM   Specimen: Tracheal Aspirate; Respiratory  Result Value Ref Range Status   Specimen Description   Final    TRACHEAL ASPIRATE Performed at [facility] [address] [place], [place] [address]    Special Requests   Final    NONE Performed at [facility], [place] [address], [place], [place] [address]    Gram Stain   Final    MODERATE WBC PRESENT, PREDOMINANTLY PMN RARE GRAM POSITIVE COCCI    Culture   Final    FEW Normal respiratory flora-no Staph aureus or Pseudomonas seen Performed at [facility] [address] [place], [place] [address]    Report Status 05/01/2020 FINAL  Final  Culture, blood (Routine X 2) w Reflex to ID Panel     Status: None   Collection Time: 04/28/20  1:24 PM   Specimen: BLOOD RIGHT  HAND  Result Value Ref Range Status   Specimen Description   Final    BLOOD RIGHT HAND Performed at [facility], [place] [address], [place], [place] [address]    Special Requests   Final    BOTTLES DRAWN AEROBIC AND ANAEROBIC Blood Culture adequate volume Performed at [facility] [address], [place], [place] [address]    Culture   Final    NO GROWTH 5 DAYS Performed at [facility], [place] [address], [place], [place] [address]    Report Status 05/03/2020 FINAL  Final  Culture, blood (Routine X 2) w Reflex to ID Panel     Status: None   Collection Time: 04/28/20  1:24 PM   Specimen: BLOOD  Result Value Ref Range Status   Specimen Description   Final    BLOOD LEFT ANTECUBITAL Performed at [facility] [address] [place], [place] [address]    Special Requests   Final    BOTTLES DRAWN AEROBIC ONLY Blood Culture adequate volume Performed at [facility] [address] [place], [place] [address]    Culture   Final    NO GROWTH 5 DAYS Performed at [facility], [place] [address], [place], [place] [address]    Report Status 05/03/2020 FINAL  Final  Culture, Urine     Status: Abnormal   Collection Time: 04/28/20  4:20 PM   Specimen: Urine, Clean Catch  Result Value Ref Range Status   Specimen Description   Final    URINE, CLEAN CATCH Performed at [facility], [place] [address], [place], [place] [address]    Special Requests   Final    NONE Performed at [facility], [place] [address] [place], [place] [address]    Culture >=100,000 COLONIES/mL STAPHYLOCOCCUS EPIDERMIDIS (A)  Final   Report Status 04/30/2020 FINAL  Final   Organism ID, Bacteria STAPHYLOCOCCUS EPIDERMIDIS (A)  Final      Susceptibility   Staphylococcus epidermidis - MIC*    CIPROFLOXACIN >=8 RESISTANT Resistant     GENTAMICIN <=0.5 SENSITIVE Sensitive     NITROFURANTOIN <=16 SENSITIVE Sensitive     OXACILLIN <=0.25 SENSITIVE Sensitive     TETRACYCLINE 2 SENSITIVE Sensitive     VANCOMYCIN 1 SENSITIVE Sensitive     TRIMETH/SULFA >=320 RESISTANT Resistant     CLINDAMYCIN >=8 RESISTANT Resistant     RIFAMPIN <=0.5 SENSITIVE Sensitive     Inducible Clindamycin NEGATIVE Sensitive      * >=100,000 COLONIES/mL STAPHYLOCOCCUS EPIDERMIDIS  Culture, respiratory     Status: None   Collection Time: 05/03/20  9:44 AM   Specimen: Bronchoalveolar Lavage; Respiratory  Result Value Ref Range Status   Specimen Description   Final    BRONCHIAL ALVEOLAR LAVAGE RLL Performed at [facility] [address], [place], [place] [address]    Special Requests   Final    NONE Performed at [facility], [place] [address], [place], [place] [address]    Gram Stain   Final    RARE WBC PRESENT, PREDOMINANTLY PMN NO ORGANISMS SEEN    Culture   Final    RARE Normal respiratory flora-no Staph aureus or Pseudomonas seen Performed at [facility] [address] [place], [place] [address]    Report Status 05/05/2020 FINAL  Final  Acid Fast Smear (AFB)     Status: None   Collection Time: 05/03/20  9:44 AM   Specimen: Bronchial Alveolar Lavage  Result Value Ref Range Status   AFB Specimen Processing Concentration  Final   Acid Fast Smear Negative  Final    Comment: (NOTE) Performed At: [facility] [place], [place] [id number] Rush Farmer MD OT:[id number]    Source (AFB) BRONCHIAL ALVEOLAR LAVAGE  Final    Comment: RLL Performed at [facility] [address], [place], [place] [address]   Fungus Culture With Stain     Status: None (Preliminary result)   Collection Time: 05/03/20  9:44 AM   Specimen: Bronchial Alveolar Lavage  Result Value Ref Range Status   Fungus Stain Final report  Final    Comment: (NOTE) Performed At: [facility] [place], [place] [id number] Rush Farmer MD AY:[id number]    Fungus (Mycology) Culture PENDING  Incomplete   Fungal Source BRONCHIAL ALVEOLAR LAVAGE  Final    Comment: RLL Performed at [facility], [place] [address] [place], [place] [address]   Fungus Culture Result     Status: None   Collection Time: 05/03/20  9:44 AM    Result Value Ref Range Status   Result 1 Comment  Final    Comment: (NOTE) KOH/Calcofluor preparation:  no fungus observed. Performed At: [facility] [place], [place] [id number] Rush Farmer MD YS:[id number]          Radiology Studies: No results found.      Scheduled Meds: . chlorhexidine  15 mL Mouth Rinse BID  . Chlorhexidine Gluconate Cloth  6 each Topical Daily  . cholecalciferol  1,000 Units Per Tube Daily  . diltiazem  90 mg Per Tube Q6H  . enoxaparin (LOVENOX) injection  60 mg Subcutaneous Q24H  . feeding supplement (PROSource TF)  45 mL Per Tube TID  . free water  200 mL Per Tube Q4H  . insulin aspart  0-15 Units Subcutaneous Q6H  . insulin detemir  15 Units Subcutaneous QHS  . linagliptin  5 mg Per Tube Daily  . mouth rinse  15 mL Mouth Rinse q12n4p  . metoprolol tartrate  50 mg Per Tube Q8H  . multivitamin  15 mL Per Tube Daily  . pneumococcal 23 valent vaccine  0.5 mL Intramuscular Tomorrow-1000  . [START ON 05/08/2020] predniSONE  10 mg Per NG tube Daily  . sodium chloride flush  3 mL Intravenous Q12H   Continuous Infusions: . ceFEPime (MAXIPIME) IV Stopped (05/07/20 0557)  . feeding supplement (VITAL 1.5 CAL) 1,000 mL (05/07/20 0535)     LOS: 17 days    Time spent: 45 minutes    Barb Merino, MD [facility] Pager [phone]

## 2020-05-07 NOTE — Evaluation (Signed)
Clinical/Bedside Swallow Evaluation Patient Details  Name: [name] MRN: [id number] Date of Birth: 03/01/1966  Today's Date: 05/07/2020 Time: SLP Start Time (ACUTE ONLY): 1310 SLP Stop Time (ACUTE ONLY): 1350 SLP Time Calculation (min) (ACUTE ONLY): 40 min  Past Medical History:  Past Medical History:  Diagnosis Date  . Anxiety    No medications currently  . Arthritis   . Asthma   . Bilateral carpal tunnel syndrome   . Crohn disease ([place])   . Diabetes mellitus    diet controlled  . Heart murmur    mild  . High cholesterol   . Hypertension    pcp  Dr Coralyn Mark Quillian Quince    [place]  . IBS (irritable bowel syndrome)   . Obesity   . Pneumonia 2011   History of  . Sleep apnea 03-2009 study   pt forgot to mention at pre admit visit.  uses c-pap brought her own  machine from home.   . Trigger thumb, right thumb    Past Surgical History:  Past Surgical History:  Procedure Laterality Date  . CARPAL TUNNEL RELEASE Right 09/20/2019   Procedure: CARPAL TUNNEL RELEASE;  Surgeon: Hiram Gash, MD;  Location: [facility];  Service: Orthopedics;  Laterality: Right;  . CARPAL TUNNEL RELEASE Left 11/15/2019   Procedure: CARPAL TUNNEL RELEASE, EXCISION OF GANGLION CYST;  Surgeon: Hiram Gash, MD;  Location: [facility];  Service: Orthopedics;  Laterality: Left;  . CHOLECYSTECTOMY N/A 03/31/2013   Procedure: LAPAROSCOPIC CHOLECYSTECTOMY;  Surgeon: Jamesetta So, MD;  Location: [facility];  Service: General;  Laterality: N/A;  . COLONOSCOPY N/A 03/16/2013   Procedure: COLONOSCOPY;  Surgeon: Rogene Houston, MD;  Location: [facility];  Service: Endoscopy;  Laterality: N/A;  255  . COLONOSCOPY N/A 12/31/2014   Procedure: COLONOSCOPY;  Surgeon: Rogene Houston, MD;  Location: [facility];  Service: Endoscopy;  Laterality: N/A;  730  . cyst removed from left hand    . FLEXIBLE SIGMOIDOSCOPY N/A 08/21/2014   Procedure: FLEXIBLE SIGMOIDOSCOPY;  Surgeon: Rogene Houston, MD;  Location: [facility];  Service:  Endoscopy;  Laterality: N/A;  1030  . TONSILLECTOMY    . TOTAL HIP ARTHROPLASTY  04/18/2012   Procedure: TOTAL HIP ARTHROPLASTY;  Surgeon: Marybelle Killings, MD;  Location: [place];  Service: Orthopedics;  Laterality: Right;  Right Total Hip Arthroplasty   HPI:  54 yo female adm to [facility] on 8/21 from [place] after admitted there on 8/17 - after having COVID with respiratory failure.  Pt treated with remedsivir, prednisone, ABX for CAP.  Pt found to have ARDS, pneumonia, VDRF- intubated 8/21-9/4.  She has a small bore feeding tube in place. Swallow evaluation ordered.  Pt reports she is ready to eat and drink and wants the tube removed from her nares.   Assessment / Plan / Recommendation Clinical Impression  Today pt alert and able to phonate - mild dysphonia.   She stated she was disappointed and wants to get her tube out - saying someone told her she would get it out.  She is grossly weak with inability to lift arm to face- SLP elevated arms on pillows due to edema.  She was willing to consume intake and has intact dentition.  Pt appears with timely swallow however concern for silent aspiration is present given her cough with 3 ounce yale water test.  She even demonstrated coughing later during testing with thin water with small single bolus. ? if she may  have some pharyngeal/laryngeal edema that may contribute to sensory impairment and/or inadequate musculature contraction allowing aspiration.  Anticipate pt to quickly be able to transit into a diet however within the next few days.  No indications of difficulties with ice, nectar, applesauce and cracker but recommend caution given prolonged intubation and dysphonia.  (8/21-9/4).  Toward end of SMALL snack *few bites applesauce, 2 bites graham cracker, 2 ounces water and 2 ounces juice, pt with increased RR and advising that she was "worn out".  Recommend free ice chips and proceed with mbs hopefully as soon as she can come off precautions.  If she is unable to  dc precautions tomorrow, perhaps she can have MBS as terminal test of the day.  Pt educated to plan and was informed of need to keep feeding tube for nutritional support due to quick fatigue. SLP Visit Diagnosis: Dysphagia, oropharyngeal phase (R13.12)    Aspiration Risk  Mild aspiration risk    Diet Recommendation Ice chips PRN after oral care   Medication Administration: Via alternative means Supervision: Staff to assist with self feeding Postural Changes: Seated upright at 90 degrees;Remain upright for at least 30 minutes after po intake    Other  Recommendations Oral Care Recommendations: Oral care prior to ice chip/H20   Follow up Recommendations None      Frequency and Duration min 1 x/week  1 week       Prognosis Prognosis for Safe Diet Advancement: Good      Swallow Study   General Date of Onset: 05/07/20 HPI: 54 yo female adm to [facility] on 8/21 from [place] after admitted there on 8/17 - after having COVID with respiratory failure.  Pt treated with remedsivir, prednisone, ABX for CAP.  Pt found to have ARDS, pneumonia, VDRF- intubated 8/21-9/4.  She has a small bore feeding tube in place. Swallow evaluation ordered.  Pt reports she is ready to eat and drink and wants the tube removed from her nares. Type of Study: Bedside Swallow Evaluation Diet Prior to this Study: NPO;NG Tube Temperature Spikes Noted: No Respiratory Status:  (High flow) History of Recent Intubation: Yes Length of Intubations (days): 15 days Date extubated: 05/04/20 Behavior/Cognition: Alert;Cooperative;Pleasant mood Oral Cavity Assessment: Within Functional Limits Oral Care Completed by SLP: No Oral Cavity - Dentition: Adequate natural dentition Vision: Impaired for self-feeding Self-Feeding Abilities: Total assist Patient Positioning: Upright in bed Baseline Vocal Quality: Suspected CN X (Vagus) involvement;Low vocal intensity Volitional Cough: Weak Volitional Swallow: Able to elicit     Oral/Motor/Sensory Function Overall Oral Motor/Sensory Function: Other (comment) (appears with abrasion on left side of tongue - rectangular in shape - pt denies discomfort)   Ice Chips Ice chips: Not tested   Thin Liquid Thin Liquid: Impaired Presentation: Straw;Cup;Spoon Pharyngeal  Phase Impairments: Cough - Immediate Other Comments: cough after sips of water via straw, even with small single boluses, pt failed 3 ounce yale water test    Nectar Thick Nectar Thick Liquid: Within functional limits Presentation: Cup;Spoon;Straw   Honey Thick Honey Thick Liquid: Not tested   Puree Puree: Within functional limits Presentation: Spoon   Solid     Solid: Within functional limits Other Comments: graham cracker boluses      Macario Golds 05/07/2020,3:04 PM  Kathleen Lime, MS [facility] SLP [place] Office [phone]

## 2020-05-07 NOTE — Progress Notes (Signed)
Inpatient Rehabilitation Admissions Coordinator  Inpatient rehab consult received. Noted Covid + 8/9. Now cleared from airborne precautions. I will meet with patient on 9/8. I await further tolerance for more intensive therapies before proceeding with CIR admit and insurance authorization.  Danne Baxter, RN, MSN Rehab Admissions Coordinator [phone] 05/07/2020 1:35 PM

## 2020-05-07 NOTE — Progress Notes (Addendum)
Physical Therapy Treatment Patient Details Name: [name] MRN: [id number] DOB: 06/22/1966 Today's Date: 05/07/2020    History of Present Illness 54 yo female tested positive for COVID 19 on 8/09.  Had progressive symptoms and admitted to [facility] on 8/17.  Treated with remdesivir, prednisone and ABx for CAP.  She developed worsening hypoxia and required intubation 04/21/20 AND  was transferred TO [facility]  with ARDS. Extubated 05/04/20.  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 Patient participated in  Bed mobility and sat on bed edge x 10 minutes with min assistance and multimodal cues. Patient is making excellent progress.   patient in bed on RA SPO2 92%, dropped to 88% while sitting. RR 22-31. Patient continues with profound weakness of dorsiflex/ and long finger extension/wrist extension bilaterally.   Follow Up Recommendations  CIR     Equipment Recommendations   (next venue)    Recommendations for Other Services Rehab consult     Precautions / Restrictions Precautions Precaution Comments: monitor VS,  has foot drop and wrist drop bilaterally. Required Braces or Orthoses: Other Brace Other Brace: prevalon boots    Mobility  Bed Mobility   Bed Mobility: Rolling;Sidelying to Sit;Sit to Sidelying Rolling: +2 for safety/equipment;+2 for physical assistance;Max assist Sidelying to sit: Total assist;+2 for physical assistance;+2 for safety/equipment;HOB elevated Supine to sit: +2 for safety/equipment;Total assist;+2 for physical assistance   Sit to sidelying: Total assist;+2 for physical assistance;+2 for safety/equipment General bed mobility comments: multimodal cues to move legs toward bed edge, 2 total assist to sit upright and position on bed edge.  2 total assist to reurn to right side then roll over. patient did assit trunk to slide  toward middle of bed.  Transfers                 General transfer comment: NT- needs maxisky/move  Ambulation/Gait                 Stairs             Wheelchair Mobility    Modified Rankin (Stroke Patients Only)       Balance Overall balance assessment: Needs assistance Sitting-balance support: Bilateral upper extremity supported;Feet unsupported Sitting balance-Leahy Scale: Poor Sitting balance - Comments: decreased UE use for support( decreasd control of hands). Sat x 10 minutes with min assist and verbal cues for trunk comtrol. Patient on RA with SPO2 >88%-92 % while sitting Postural control: Posterior lean                                  Cognition Arousal/Alertness: Awake/alert Behavior During Therapy: Flat affect;WFL for tasks assessed/performed Overall Cognitive Status: Impaired/Different from baseline Area of Impairment: Orientation;Attention;Memory;Following commands;Safety/judgement;Awareness                 Orientation Level: Time;Situation Current Attention Level: Sustained Memory: Decreased short-term memory Following Commands: Follows one step commands consistently Safety/Judgement: Decreased awareness of deficits Awareness: Intellectual   General Comments: today , patient oriented to [facility],  not date, repeatedly stating that she wanted to get up OOB. , di not want to lie down      Exercises      General Comments  Pertinent Vitals/Pain Pain Assessment: Faces Faces Pain Scale: No hurt    Home Living                      Prior Function            PT Goals (current goals can now be found in the care plan section) Progress towards PT goals: Progressing toward goals    Frequency    Min 3X/week      PT Plan Current plan remains appropriate    Co-evaluation              AM-PAC PT "6 Clicks"  Mobility   Outcome Measure  Help needed turning from your back to your side while in a flat bed without using bedrails?: A Lot Help needed moving from lying on your back to sitting on the side of a flat bed without using bedrails?: Total Help needed moving to and from a bed to a chair (including a wheelchair)?: Total Help needed standing up from a chair using your arms (e.g., wheelchair or bedside chair)?: Total Help needed to walk in hospital room?: Total Help needed climbing 3-5 steps with a railing? : Total 6 Click Score: 7    End of Session   Activity Tolerance: Patient tolerated treatment well Patient left: in bed;with call bell/phone within reach;with nursing/sitter in room Nurse Communication: Mobility status PT Visit Diagnosis: Unsteadiness on feet (R26.81);Muscle weakness (generalized) (M62.81);Other symptoms and signs involving the nervous system (R29.898);Difficulty in walking, not elsewhere classified (R26.2)     Time: 6734-1937 PT Time Calculation (min) (ACUTE ONLY): 38 min  Charges:  $Neuromuscular Re-education: 38-52 mins                     Tresa Endo PT Acute Rehabilitation Services Pager [phone] Office [phone]    Claretha Cooper 05/07/2020, 4:23 PM

## 2020-05-08 DIAGNOSIS — I10 Essential (primary) hypertension: Secondary | ICD-10-CM

## 2020-05-08 DIAGNOSIS — R1312 Dysphagia, oropharyngeal phase: Secondary | ICD-10-CM

## 2020-05-08 DIAGNOSIS — R5381 Other malaise: Secondary | ICD-10-CM

## 2020-05-08 DIAGNOSIS — K50919 Crohn's disease, unspecified, with unspecified complications: Secondary | ICD-10-CM

## 2020-05-08 DIAGNOSIS — E1159 Type 2 diabetes mellitus with other circulatory complications: Secondary | ICD-10-CM

## 2020-05-08 DIAGNOSIS — E870 Hyperosmolality and hypernatremia: Secondary | ICD-10-CM

## 2020-05-08 DIAGNOSIS — K580 Irritable bowel syndrome with diarrhea: Secondary | ICD-10-CM

## 2020-05-08 LAB — CBC WITH DIFFERENTIAL/PLATELET
Abs Immature Granulocytes: 0.68 10*3/uL — ABNORMAL HIGH (ref 0.00–0.07)
Basophils Absolute: 0.1 10*3/uL (ref 0.0–0.1)
Basophils Relative: 0 %
Eosinophils Absolute: 0.2 10*3/uL (ref 0.0–0.5)
Eosinophils Relative: 1 %
HCT: 33.6 % — ABNORMAL LOW (ref 36.0–46.0)
Hemoglobin: 11.3 g/dL — ABNORMAL LOW (ref 12.0–15.0)
Immature Granulocytes: 5 %
Lymphocytes Relative: 20 %
Lymphs Abs: 2.7 10*3/uL (ref 0.7–4.0)
MCH: 34 pg (ref 26.0–34.0)
MCHC: 33.6 g/dL (ref 30.0–36.0)
MCV: 101.2 fL — ABNORMAL HIGH (ref 80.0–100.0)
Monocytes Absolute: 0.6 10*3/uL (ref 0.1–1.0)
Monocytes Relative: 4 %
Neutro Abs: 9.5 10*3/uL — ABNORMAL HIGH (ref 1.7–7.7)
Neutrophils Relative %: 70 %
Platelets: 148 10*3/uL — ABNORMAL LOW (ref 150–400)
RBC: 3.32 MIL/uL — ABNORMAL LOW (ref 3.87–5.11)
RDW: 13.2 % (ref 11.5–15.5)
WBC: 13.7 10*3/uL — ABNORMAL HIGH (ref 4.0–10.5)
nRBC: 0 % (ref 0.0–0.2)

## 2020-05-08 LAB — GLUCOSE, CAPILLARY
Glucose-Capillary: 125 mg/dL — ABNORMAL HIGH (ref 70–99)
Glucose-Capillary: 143 mg/dL — ABNORMAL HIGH (ref 70–99)
Glucose-Capillary: 151 mg/dL — ABNORMAL HIGH (ref 70–99)
Glucose-Capillary: 182 mg/dL — ABNORMAL HIGH (ref 70–99)

## 2020-05-08 LAB — BASIC METABOLIC PANEL
Anion gap: 7 (ref 5–15)
BUN: 20 mg/dL (ref 6–20)
CO2: 22 mmol/L (ref 22–32)
Calcium: 7.8 mg/dL — ABNORMAL LOW (ref 8.9–10.3)
Chloride: 110 mmol/L (ref 98–111)
Creatinine, Ser: <0.3 mg/dL — ABNORMAL LOW (ref 0.44–1.00)
Glucose, Bld: 185 mg/dL — ABNORMAL HIGH (ref 70–99)
Potassium: 3.5 mmol/L (ref 3.5–5.1)
Sodium: 139 mmol/L (ref 135–145)

## 2020-05-08 LAB — PHOSPHORUS: Phosphorus: 3.3 mg/dL (ref 2.5–4.6)

## 2020-05-08 LAB — MAGNESIUM: Magnesium: 2 mg/dL (ref 1.7–2.4)

## 2020-05-08 MED ORDER — KETOROLAC TROMETHAMINE 30 MG/ML IJ SOLN
30.0000 mg | Freq: Once | INTRAMUSCULAR | Status: AC
  Administered 2020-05-08: 30 mg via INTRAVENOUS

## 2020-05-08 MED ORDER — HYDROXYZINE HCL 10 MG PO TABS
20.0000 mg | ORAL_TABLET | Freq: Three times a day (TID) | ORAL | Status: DC | PRN
  Administered 2020-05-08: 20 mg via ORAL
  Filled 2020-05-08 (×3): qty 2

## 2020-05-08 MED ORDER — HYDROXYZINE HCL 25 MG PO TABS: 25.0000 mg | ORAL_TABLET | Freq: Three times a day (TID) | ORAL | Status: DC | PRN

## 2020-05-08 MED ORDER — FUROSEMIDE 10 MG/ML IJ SOLN
20.0000 mg | Freq: Once | INTRAMUSCULAR | Status: AC
  Administered 2020-05-08: 20 mg via INTRAVENOUS
  Filled 2020-05-08: qty 2

## 2020-05-08 MED ORDER — TRAZODONE HCL 50 MG PO TABS
50.0000 mg | ORAL_TABLET | Freq: Every evening | ORAL | Status: DC | PRN
  Filled 2020-05-08 (×2): qty 1

## 2020-05-08 MED ORDER — KETOROLAC TROMETHAMINE 30 MG/ML IJ SOLN
INTRAMUSCULAR | Status: AC
  Filled 2020-05-08: qty 1

## 2020-05-08 NOTE — Progress Notes (Signed)
Inpatient Rehabilitation Admissions Coordinator  Patient remains on isolation today. I will follow her progress with therapy tolerance to assist with planning dispo options. Not yet at a level to pursue Cir admit.  Danne Baxter, RN, MSN Rehab Admissions Coordinator [phone] 05/08/2020 12:37 PM

## 2020-05-08 NOTE — Progress Notes (Signed)
Can give PO meds via gastric tube per Guilford Shi, MD.

## 2020-05-08 NOTE — Progress Notes (Signed)
  Speech Language Pathology Treatment: Dysphagia  Patient Details Name: [name] MRN: [id number] DOB: 01/16/1966 Today's Date: 05/08/2020 Time: 7893-8101 SLP Time Calculation (min) (ACUTE ONLY): 34 min  Assessment / Plan / Recommendation Clinical Impression  Pt's voice subjectively today appears consistent with yesterdays strength/quality.  Determination of readiness for MBS, education to importance of nutrition and intake of ice chips reviewed.  Pt today willingly consumed ice chips with no indication of aspiration and timely swallow.  Today pt again becomes fatigued quickly with some dyspnea with effort *even without her awareness.    Educated pt to need to keep small bore feeding tube in place even after initiating diet due to quick fatigue factor decreased ability to meet nutritional needs via po alone.  She clinically appears ready for an MBS however unfortunately xray room is down and xray is attempting to repair.   Earliest pt will be able to have MBS is tomorrow afternoon - in the interim time recommend continue frequent ice chips with precautions.       HPI HPI: 54 yo female adm to [facility] on 8/21 from [place] after admitted there on 8/17 - after having COVID with respiratory failure.  Pt treated with remedsivir, prednisone, ABX for CAP.  Pt found to have ARDS, pneumonia, VDRF- intubated 8/21-9/4.  She has a small bore feeding tube in place. Swallow evaluation ordered.  Pt reports she is ready to eat and drink. Today seen to determine readiness for MBS and to assess progression of voice/swallowing.      SLP Plan  MBS (tomorrow early pm as xray schedule permits)       Recommendations  Diet recommendations: NPO;Other(comment) (frequent ice chips) Medication Administration: Via alternative means                Oral Care Recommendations: Oral care prior to ice chip/H20 Follow up Recommendations: Inpatient Rehab SLP Visit Diagnosis: Dysphagia, oropharyngeal phase  (R13.12) Plan: MBS (tomorrow early pm as xray schedule permits)       GO                Macario Golds 05/08/2020, 11:30 AM  Kathleen Lime, MS [place] Office [phone]

## 2020-05-08 NOTE — Progress Notes (Addendum)
PROGRESS NOTE    [name]  PPJ:[id number]  DOB: [birth year]  PCP: Caryl Bis, MD Admit date:04/20/2020 Chief complaint: Shortness of breath Hospital course: 54 year old female with obesity, hypertension, diabetes, Crohn's disease, unvaccinated for COVID-19 was diagnosed with COVID-19 illness with positive test on 8/9-> on 8/17 admitted to [facility]  with progressive shortness of breath where she developed ARDS, intubated . On 8/22 , she was transferred to [facility] . 8/22-9/5,  continued mechanical intubation, prolonged recovery.  On broad-spectrum antibiotics and Covid antiviral therapy as chest x-ray showed bilateral infiltrates.  She was extubated on 9/4 with 8 L O2 nasal cannula requirement, transitioned to room air and transferred to [facility] service on 9/6.  Subjective:  Patient still in stepdown unit awaiting bed availability.  Appears dyspneic on talking full sentences, has NG tube/core track in place.  Speech therapy at bedside for repeat evaluation.  Patient complaining of back discomfort in hospital bed and wants to be repositioned.  Objective: Vitals:   05/08/20 0900 05/08/20 1100 05/08/20 1200 05/08/20 1400  BP:  (!) 179/56 (!) 166/71 (!) 161/64  Pulse:  90 94 93  Resp:      Temp: (!) 97.4 F (36.3 C)  98.3 F (36.8 C)   TempSrc: Oral  Oral   SpO2:  94% 95% 94%  Weight:      Height:        Intake/Output Summary (Last 24 hours) at 05/08/2020 1545 Last data filed at 05/08/2020 1400 Gross per 24 hour  Intake 730 ml  Output 1400 ml  Net -670 ml   Filed Weights   05/06/20 0500 05/07/20 0500 05/08/20 0500  Weight: 123.6 kg 122.6 kg 121.3 kg    Physical Examination:  General: Obese female, mild distress while talking full sentences  Head ENT: NG tube in place, obese neck, PERRLA Heart: S1-S2 heard, regular rate and rhythm, no murmurs.   Lungs: Equal air entry bilaterally, no rhonchi or rales on exam, no accessory muscle use Abdomen: Bowel  sounds heard, soft, nontender, nondistended. No organomegaly.  No CVA tenderness Extremities: Trace leg edema noted, no cyanosis or clubbing. Neurological: Awake alert oriented x3, appears deconditioned with generalized weakness, no focal deficits  skin: No wounds or rashes.     Data Reviewed: I have personally reviewed following labs and imaging studies  CBC: Recent Labs  Lab 05/04/20 0500 05/05/20 0430 05/06/20 0353 05/07/20 0456 05/08/20 0412  WBC 9.8 14.8* 15.7* 14.0* 13.7*  NEUTROABS 7.3 10.0* 10.9*  --  9.5*  HGB 7.2* 11.0* 11.9* 10.8* 11.3*  HCT 23.9* 34.8* 35.7* 33.4* 33.6*  MCV 110.1* 106.1* 102.3* 103.1* 101.2*  PLT 70* 125* 140* 146* 580*   Basic Metabolic Panel: Recent Labs  Lab 05/02/20 0430 05/03/20 0523 05/04/20 2140 05/05/20 0430 05/06/20 0353 05/07/20 0456 05/08/20 0412  NA 144   < > 148* 148* 145 145 139  K 4.4   < > 4.1 3.3* 3.3* 3.6 3.5  CL 105   < > 110 113* 111 110 110  CO2 29   < > _0 GLUCOSE 202*   < > 176* 137* 114* 121* 185*  BUN 75*   < > 31* 28* 26* 25* 20  CREATININE 1.81*   < > 0.35* <0.30* <0.30* 0.33* <0.30*  CALCIUM 7.6*   < > 8.1* 7.5* 7.6* 7.9* 7.8*  MG 2.6*  --   --   --  2.1  --  2.0  PHOS  --   --   --   --   --   --  3.3   < > = values in this interval not displayed.   GFR: CrCl cannot be calculated (This lab value cannot be used to calculate CrCl because it is not a number: <0.30). Liver Function Tests: Recent Labs  Lab 05/06/20 0353  AST 27  ALT 44  ALKPHOS 45  BILITOT 0.7  PROT 5.1*  ALBUMIN 2.1*   No results for input(s): LIPASE, AMYLASE in the last 168 hours. No results for input(s): AMMONIA in the last 168 hours. Coagulation Profile: No results for input(s): INR, PROTIME in the last 168 hours. Cardiac Enzymes: No results for input(s): CKTOTAL, CKMB, CKMBINDEX, TROPONINI in the last 168 hours. BNP (last 3 results) No results for input(s): PROBNP in the last 8760 hours. HbA1C: No results for  input(s): HGBA1C in the last 72 hours. CBG: Recent Labs  Lab 05/07/20 1618 05/07/20 1741 05/08/20 0009 05/08/20 0634 05/08/20 1204  GLUCAP 184* 159* 151* 125* 182*   Lipid Profile: No results for input(s): CHOL, HDL, LDLCALC, TRIG, CHOLHDL, LDLDIRECT in the last 72 hours. Thyroid Function Tests: No results for input(s): TSH, T4TOTAL, FREET4, T3FREE, THYROIDAB in the last 72 hours. Anemia Panel: No results for input(s): VITAMINB12, FOLATE, FERRITIN, TIBC, IRON, RETICCTPCT in the last 72 hours. Sepsis Labs: No results for input(s): PROCALCITON, LATICACIDVEN in the last 168 hours.  Recent Results (from the past 240 hour(s))  Culture, Urine     Status: Abnormal   Collection Time: 04/28/20  4:20 PM   Specimen: Urine, Clean Catch  Result Value Ref Range Status   Specimen Description   Final    URINE, CLEAN CATCH Performed at [facility], [place] [address] [place], [place] [address]    Special Requests   Final    NONE Performed at [facility], [place] [address], [place], [place] [address]    Culture >=100,000 COLONIES/mL STAPHYLOCOCCUS EPIDERMIDIS (A)  Final   Report Status 04/30/2020 FINAL  Final   Organism ID, Bacteria STAPHYLOCOCCUS EPIDERMIDIS (A)  Final      Susceptibility   Staphylococcus epidermidis - MIC*    CIPROFLOXACIN >=8 RESISTANT Resistant     GENTAMICIN <=0.5 SENSITIVE Sensitive     NITROFURANTOIN <=16 SENSITIVE Sensitive     OXACILLIN <=0.25 SENSITIVE Sensitive     TETRACYCLINE 2 SENSITIVE Sensitive     VANCOMYCIN 1 SENSITIVE Sensitive     TRIMETH/SULFA >=320 RESISTANT Resistant     CLINDAMYCIN >=8 RESISTANT Resistant     RIFAMPIN <=0.5 SENSITIVE Sensitive     Inducible Clindamycin NEGATIVE Sensitive     * >=100,000 COLONIES/mL STAPHYLOCOCCUS EPIDERMIDIS  Culture, respiratory     Status: None   Collection Time: 05/03/20  9:44 AM   Specimen: Bronchoalveolar Lavage; Respiratory  Result Value Ref Range Status   Specimen  Description   Final    BRONCHIAL ALVEOLAR LAVAGE RLL Performed at [place] [address], [place], [place] [address]    Special Requests   Final    NONE Performed at [facility], [place] [address] [place], [place] [address]    Gram Stain   Final    RARE WBC PRESENT, PREDOMINANTLY PMN NO ORGANISMS SEEN    Culture   Final    RARE Normal respiratory flora-no Staph aureus or Pseudomonas seen Performed at [place] [address] [place], [place] [address]    Report Status 05/05/2020 FINAL  Final  Acid Fast Smear (AFB)     Status: None   Collection  Time: 05/03/20  9:44 AM   Specimen: Bronchial Alveolar Lavage  Result Value Ref Range Status   AFB Specimen Processing Concentration  Final   Acid Fast Smear Negative  Final    Comment: (NOTE) Performed At: [facility] [place], [place] [id number] Rush Farmer MD [id number]    Source (AFB) BRONCHIAL ALVEOLAR LAVAGE  Final    Comment: RLL Performed at [place] [address], [place], [place] [address]   Fungus Culture With Stain     Status: None (Preliminary result)   Collection Time: 05/03/20  9:44 AM   Specimen: Bronchial Alveolar Lavage  Result Value Ref Range Status   Fungus Stain Final report  Final    Comment: (NOTE) Performed At: [facility] [place], [place] [id number] Rush Farmer MD [id number]    Fungus (Mycology) Culture PENDING  Incomplete   Fungal Source BRONCHIAL ALVEOLAR LAVAGE  Final    Comment: RLL Performed at [facility], [place] [address] [place], [place] [address]   Fungus Culture Result     Status: None   Collection Time: 05/03/20  9:44 AM  Result Value Ref Range Status   Result 1 Comment  Final    Comment: (NOTE) KOH/Calcofluor preparation:  no fungus observed. Performed At: [facility] [place], [place] [id number] Rush Farmer MD [id number]       Radiology Studies: No results found.    Scheduled Meds: . chlorhexidine  15 mL Mouth Rinse BID  . Chlorhexidine Gluconate Cloth  6 each Topical Daily  . cholecalciferol  1,000 Units Per Tube Daily  . diltiazem  90 mg Per Tube Q6H  . enoxaparin (LOVENOX) injection  60 mg Subcutaneous Q24H  . feeding supplement (PROSource TF)  45 mL Per Tube BID  . free water  200 mL Per Tube Q4H  . insulin aspart  0-15 Units Subcutaneous Q6H  . insulin detemir  15 Units Subcutaneous QHS  . linagliptin  5 mg Per Tube Daily  . mouth rinse  15 mL Mouth Rinse q12n4p  . metoprolol tartrate  50 mg Per Tube Q8H  . multivitamin  15 mL Per Tube Daily  . pneumococcal 23 valent vaccine  0.5 mL Intramuscular Tomorrow-1000  . predniSONE  10 mg Per NG tube Daily  . sodium chloride flush  3 mL Intravenous Q12H   Continuous Infusions: . feeding supplement (VITAL 1.5 CAL) 1,000 mL (05/08/20 0030)      Assessment/Plan:  1.Acute hypoxemic respiratory failure due to bilateral COVID-19 pneumonia/ARDS: Required mechanical ventilation from 8/17 to 05/05/2020.  S/p treatment with remdesivir, steroids and neb treatments.  Not felt to be a candidate Baricitinib because of Crohn's disease. Now extubated and saturating well on room air.  On prednisone taper with 10 mg dosage for couple more days.  Received IV vancomycin during ICU course.  Finishes empiric antibiotics with 10-day course of cefepime today.  2.  Acute renal failure mild hypernatremia: During ICU course, now resolved.  Continue tube feeds and free water.  Home medications-lisinopril, HCTZ and Aldactone on hold.  3. Acute metabolic encephalopathy: In the setting of problem #1.  Now resolved.  She appears overall deconditioned but mentating well, communicated appropriately.  4.  Type 2 diabetes mellitus, steroid-induced hyperglycemia: Lantus dose adjusted based on blood glucose readings while on tube feeds.  Also on linagliptin.   Nutrition following.  Speech therapy at bedside today and plan for MBS.  If does well can  hopefully transition off tube feeds to oral diet soon.  5.  Hypertension: On beta-blockers (metoprolol) and Cardizem at baseline.  Can change to long-acting Cardizem once able to tolerate oral intake.  6.  Dysphagia: Likely functional due to prolonged intubation.  Hopefully will improve soon.  Speech therapy following.  Remains on tube feeds as discussed above.  7.  Crohn's disease, IBS:?  On chronic prednisone at home (20 to 40 mg).  Resume Pentasa when able to (1000 mg 4 times daily).  On Bentyl /Mercaptopurine at home as well.  8.?  History of COPD: Noted to be on Advair, albuterol inhaler per home meds.  Nebs as needed for now  9.  Depression: Noted to be on Cymbalta, trazodone at home.  10.  Hyperlipidemia: Resume fish oil, pravastatin when able to take p.o.  11. Severe deconditioning: In the setting of prolonged intubation, ICU course.  PT/OT/speech therapy following along.  May need rehab prior to discharge home.  DVT prophylaxis: Lovenox Code Status: Full code Family / Patient Communication: Discussed with patient Disposition Plan:   Status is: Inpatient  Remains inpatient appropriate because:Inpatient level of care appropriate due to severity of illness   Dispo: The patient is from: Home              Anticipated d/c is to: To be decided              Anticipated d/c date is: 3 days              Patient currently is not medically stable to d/c.          Time spent: 35 minutes     >50% time spent in discussions with care team and coordination of care.    Guilford Shi, MD [facility] Pager in [place]  If 7PM-7AM, please contact night-coverage [email or web address] 05/08/2020, 3:45 PM

## 2020-05-08 NOTE — Progress Notes (Signed)
No urine output for over 8 hours. Bladder scan showed 941 ml. Paged Lang Snow, NP. New order for I&O cath.   I&O cath completed - 1400 ml urine output. Will continue to monitor.

## 2020-05-08 NOTE — TOC Progression Note (Signed)
Transition of Care [facility]) - Progression Note    Patient Details  Name: [name] MRN: [id number] Date of Birth: 11/02/1965  Transition [facility]) CM/SW Contact  Leeroy Cha, RN Phone Number: 05/08/2020, 7:58 AM  Clinical Narrative:    Extubated to room air on 090621/off sedation and pressors, wbc 13.7 Plan is to transfer to CIR-CIR to evaluate patient on 090821,   Expected Discharge Plan: [place] (from [place]) Barriers to Discharge: Continued Medical Work up  Expected Discharge Plan and Services Expected Discharge Plan: [place] (from [place])   Discharge Planning Services: CM Consult   Living arrangements for the past 2 months: [place]                                       Social Determinants of Health (SDOH) Interventions    Readmission Risk Interventions No flowsheet data found.

## 2020-05-08 NOTE — Progress Notes (Signed)
Report called to RN receiving 1619 by the charge RN Mel Almond. Patient with no complaints prior to transfer in bed. Spouse at bedside and aware of transfer.

## 2020-05-08 NOTE — Progress Notes (Signed)
Occupational Therapy Treatment Patient Details Name: [name] MRN: [id number] DOB: 09/10/1965 Today's Date: 05/08/2020    History of present illness 54 yo female tested positive for COVID 19 on 8/09.  Had progressive symptoms and admitted to [facility] on 8/17.  Treated with remdesivir, prednisone and ABx for CAP.  She developed worsening hypoxia and required intubation 04/21/20 AND  was transferred TO [facility]  with ARDS. Extubated 05/04/20.  NLG:[id number] colitis, Carpal tunnel, Osteoarthritis, DM type 2, Asthma, HTN, HLD, OSA, Morbid obesity with BMI 49.64, Depression   OT comments  Patient presents resting supine in bed with arms propped on pillows and fingers curled into fist. Therapist provided finger extension and wrist stretch bilaterally and educated patient to keep fingers straight at rest when able. Patient tolerated arm exercises supine in bed and appears to have some improvement in wrist extension and able to activate shoulder movement with active assist. Patient encouraged to perform arm and leg movement during the commercials while watching television. Patient pleasant and motivated. Continue to recommend CIR at discharge.   Follow Up Recommendations  Supervision/Assistance - 24 hour;CIR    Equipment Recommendations  Other (comment) (TBD)    Recommendations for Other Services      Precautions / Restrictions Precautions Precautions: Fall Precaution Comments: monitor VS, has foot drop and wrist drop. Required Braces or Orthoses: Other Brace Other Brace: prevalon boots Restrictions Weight Bearing Restrictions: No       Mobility Bed Mobility                  Transfers                      Balance                                           ADL either performed or assessed with clinical judgement   ADL                                               Vision Patient Visual Report: No change from baseline      Perception     Praxis      Cognition Arousal/Alertness: Awake/alert Behavior During Therapy: WFL for tasks assessed/performed Overall Cognitive Status: Within Functional Limits for tasks assessed                                          Exercises General Exercises - Upper Extremity Shoulder Flexion: AAROM;Right;Left;10 reps;Supine Elbow Flexion: AROM;AAROM;Right;Left;20 reps;Supine (Gross AROM on left, AAROM on Right) Wrist Flexion: AROM;Right;Left;Supine;20 reps Wrist Extension: 20 reps;Supine;AROM (Only able to achieve to neutral on left) Digit Composite Flexion: 20 reps;AROM;Right;Left;Supine Composite Extension: Left;Right;AROM;5 reps Other Exercises Other Exercises: 10 ankle pumps, encouraged LE movement Other Exercises: Hands placed on bed rails, attempt to pull up (pseudo sit up) x 2. Fatigued patient quickly.   Shoulder Instructions       General Comments      Pertinent Vitals/ Pain       Pain Assessment: No/denies pain  Home Living  Prior Functioning/Environment              Frequency  Min 2X/week        Progress Toward Goals  OT Goals(current goals can now be found in the care plan section)  Progress towards OT goals: Progressing toward goals  Acute Rehab OT Goals Patient Stated Goal: To be independent OT Goal Formulation: With patient Time For Goal Achievement: 04/16/20 Potential to Achieve Goals: Good  Plan Discharge plan remains appropriate    Co-evaluation          OT goals addressed during session: ADL's and self-care      AM-PAC OT "6 Clicks" Daily Activity     Outcome Measure   Help from another person eating meals?: Total Help from another person taking care of personal grooming?: Total Help from another person toileting, which includes using toliet, bedpan, or urinal?: Total Help from another person bathing (including washing, rinsing, drying)?:  Total Help from another person to put on and taking off regular upper body clothing?: Total Help from another person to put on and taking off regular lower body clothing?: Total 6 Click Score: 6    End of Session    OT Visit Diagnosis: Muscle weakness (generalized) (M62.81);Other abnormalities of gait and mobility (R26.89);Other symptoms and signs involving cognitive function   Activity Tolerance Patient limited by fatigue   Patient Left in bed;with call bell/phone within reach;with bed alarm set;with nursing/sitter in room   Nurse Communication  (okay to see per RN)        Time: 1610-9604 OT Time Calculation (min): 27 min  Charges: OT General Charges $OT Visit: 1 Visit OT Treatments $Therapeutic Exercise: 23-37 mins  Vonda, OTR/L [place]  Office [phone] Pager: [place] 05/08/2020, 1:13 PM

## 2020-05-09 DIAGNOSIS — L89151 Pressure ulcer of sacral region, stage 1: Secondary | ICD-10-CM

## 2020-05-09 LAB — BASIC METABOLIC PANEL
Anion gap: 7 (ref 5–15)
BUN: 20 mg/dL (ref 6–20)
CO2: 22 mmol/L (ref 22–32)
Calcium: 8 mg/dL — ABNORMAL LOW (ref 8.9–10.3)
Chloride: 109 mmol/L (ref 98–111)
Creatinine, Ser: 0.45 mg/dL (ref 0.44–1.00)
GFR calc Af Amer: >60 mL/min (ref >60)
GFR calc non Af Amer: >60 mL/min (ref >60)
Glucose, Bld: 169 mg/dL — ABNORMAL HIGH (ref 70–99)
Potassium: 3.6 mmol/L (ref 3.5–5.1)
Sodium: 138 mmol/L (ref 135–145)

## 2020-05-09 LAB — GLUCOSE, CAPILLARY
Glucose-Capillary: 137 mg/dL — ABNORMAL HIGH (ref 70–99)
Glucose-Capillary: 138 mg/dL — ABNORMAL HIGH (ref 70–99)
Glucose-Capillary: 169 mg/dL — ABNORMAL HIGH (ref 70–99)
Glucose-Capillary: 96 mg/dL (ref 70–99)

## 2020-05-09 MED ORDER — MESALAMINE ER 250 MG PO CPCR
1000.0000 mg | ORAL_CAPSULE | Freq: Four times a day (QID) | ORAL | Status: DC
  Administered 2020-05-09 – 2020-05-16 (×27): 1000 mg via ORAL
  Filled 2020-05-09 (×30): qty 4

## 2020-05-09 MED ORDER — LOPERAMIDE HCL 2 MG PO CAPS
2.0000 mg | ORAL_CAPSULE | Freq: Three times a day (TID) | ORAL | Status: DC | PRN
  Administered 2020-05-10 – 2020-05-12 (×5): 2 mg via ORAL
  Filled 2020-05-09 (×6): qty 1

## 2020-05-09 MED ORDER — PREDNISONE 20 MG PO TABS
20.0000 mg | ORAL_TABLET | Freq: Every day | ORAL | Status: DC
  Administered 2020-05-10 – 2020-05-15 (×6): 20 mg via NASOGASTRIC
  Filled 2020-05-09 (×6): qty 1

## 2020-05-09 NOTE — Plan of Care (Signed)
PT BP elevated this am.  Scheduled meds administered per MAR.  No complaints at this time.   Problem: Education: Goal: Knowledge of General Education information will improve Description: Including pain rating scale, medication(s)/side effects and non-pharmacologic comfort measures Outcome: Progressing   Problem: Clinical Measurements: Goal: Ability to maintain clinical measurements within normal limits will improve Outcome: Progressing Goal: Will remain free from infection Outcome: Progressing Goal: Diagnostic test results will improve Outcome: Progressing Goal: Respiratory complications will improve Outcome: Progressing Goal: Cardiovascular complication will be avoided Outcome: Progressing   Problem: Elimination: Goal: Will not experience complications related to bowel motility Outcome: Progressing Goal: Will not experience complications related to urinary retention Outcome: Progressing   Problem: Pain Managment: Goal: General experience of comfort will improve Outcome: Progressing   Problem: Safety: Goal: Ability to remain free from injury will improve Outcome: Progressing   Problem: Skin Integrity: Goal: Risk for impaired skin integrity will decrease Outcome: Progressing   Problem: Health Behavior/Discharge Planning: Goal: Ability to manage health-related needs will improve Outcome: Not Progressing   Problem: Activity: Goal: Risk for activity intolerance will decrease Outcome: Not Progressing   Problem: Nutrition: Goal: Adequate nutrition will be maintained Outcome: Not Progressing   Problem: Coping: Goal: Level of anxiety will decrease Outcome: Not Progressing

## 2020-05-09 NOTE — Progress Notes (Signed)
PT Cancellation Note  Patient Details Name: [name] MRN: [id number] DOB: 06/24/1966   Cancelled Treatment:      Speech therapy in room.  Pt also awaiting for a flexi seal to be applied due to max uncontrolled loose stools.  Will attempt to see another day.   Rica Koyanagi  PTA Acute  Rehabilitation Services Pager      [phone] Office      [phone]

## 2020-05-09 NOTE — Progress Notes (Addendum)
PROGRESS NOTE    [name]  XBL:[id number]  DOB: 10/22/1965  PCP: Caryl Bis, MD Admit date:04/20/2020 Chief complaint: Shortness of breath Hospital course: 54 year old female with obesity, hypertension, diabetes, Crohn's disease, unvaccinated for COVID-19 was diagnosed with COVID-19 illness with positive test on 8/9-> on 8/17 admitted to [facility]  with progressive shortness of breath where she developed ARDS, intubated . On 8/22 , she was transferred to [facility] . 8/22-9/5,  continued mechanical intubation, prolonged recovery.  On broad-spectrum antibiotics and Covid antiviral therapy as chest x-ray showed bilateral infiltrates.  She was extubated on 9/4 with 8 L O2 nasal cannula requirement, transitioned to room air and transferred to [facility] service on 9/6.  Subjective:  Patient now on medical floor.  Still appears very deconditioned.  Remains on core track.  Reports several loose stools which is not unusual for her with Crohn's disease.  She is not sure of her home prednisone dosage but believes she takes 20 mg. Objective: Vitals:   05/09/20 0031 05/09/20 0442 05/09/20 0500 05/09/20 1011  BP: (!) 145/74 (!) 141/72  (!) 156/63  Pulse: 96 94  90  Resp: 16 16    Temp: (!) 97.5 F (36.4 C) 97.7 F (36.5 C)    TempSrc: Oral Oral    SpO2: 93% 94%    Weight:   121.3 kg   Height:        Intake/Output Summary (Last 24 hours) at 05/09/2020 1415 Last data filed at 05/09/2020 1200 Gross per 24 hour  Intake 255 ml  Output 502 ml  Net -247 ml   Filed Weights   05/07/20 0500 05/08/20 0500 05/09/20 0500  Weight: 122.6 kg 121.3 kg 121.3 kg    Physical Examination:  General: Obese female, mild distress while talking full sentences  Head ENT: NG tube in place, obese neck, PERRLA Heart: S1-S2 heard, regular rate and rhythm, no murmurs.   Lungs: Decreased breath sounds at bases, no rhonchi or rales on exam, no accessory muscle use Abdomen: Bowel sounds heard,  soft, nontender, nondistended. No organomegaly.  No CVA tenderness Extremities: 1+ pitting leg edema noted bilaterally, no cyanosis or clubbing. Neurological: Awake alert oriented x3, appears deconditioned with generalized weakness, no focal deficits  skin: Mild excoriation and stage I sacral pressure injury    Data Reviewed: I have personally reviewed following labs and imaging studies  CBC: Recent Labs  Lab 05/04/20 0500 05/05/20 0430 05/06/20 0353 05/07/20 0456 05/08/20 0412  WBC 9.8 14.8* 15.7* 14.0* 13.7*  NEUTROABS 7.3 10.0* 10.9*  --  9.5*  HGB 7.2* 11.0* 11.9* 10.8* 11.3*  HCT 23.9* 34.8* 35.7* 33.4* 33.6*  MCV 110.1* 106.1* 102.3* 103.1* 101.2*  PLT 70* 125* 140* 146* 300*   Basic Metabolic Panel: Recent Labs  Lab 05/05/20 0430 05/06/20 0353 05/07/20 0456 05/08/20 0412 05/09/20 0602  NA 148* 145 145 139 138  K 3.3* 3.3* 3.6 3.5 3.6  CL 113* 111 110 110 109  CO2 _0 GLUCOSE 137* 114* 121* 185* 169*  BUN 28* 26* 25* 20 20  CREATININE <0.30* <0.30* 0.33* <0.30* 0.45  CALCIUM 7.5* 7.6* 7.9* 7.8* 8.0*  MG  --  2.1  --  2.0  --   PHOS  --   --   --  3.3  --    GFR: Estimated Creatinine Clearance: 102.7 mL/min (by C-G formula based on SCr of 0.45 mg/dL). Liver Function Tests: Recent Labs  Lab 05/06/20 0353  AST  27  ALT 44  ALKPHOS 45  BILITOT 0.7  PROT 5.1*  ALBUMIN 2.1*   No results for input(s): LIPASE, AMYLASE in the last 168 hours. No results for input(s): AMMONIA in the last 168 hours. Coagulation Profile: No results for input(s): INR, PROTIME in the last 168 hours. Cardiac Enzymes: No results for input(s): CKTOTAL, CKMB, CKMBINDEX, TROPONINI in the last 168 hours. BNP (last 3 results) No results for input(s): PROBNP in the last 8760 hours. HbA1C: No results for input(s): HGBA1C in the last 72 hours. CBG: Recent Labs  Lab 05/08/20 1204 05/08/20 1824 05/09/20 0028 05/09/20 0614 05/09/20 1211  GLUCAP 182* 143* 96 137* 169*    Lipid Profile: No results for input(s): CHOL, HDL, LDLCALC, TRIG, CHOLHDL, LDLDIRECT in the last 72 hours. Thyroid Function Tests: No results for input(s): TSH, T4TOTAL, FREET4, T3FREE, THYROIDAB in the last 72 hours. Anemia Panel: No results for input(s): VITAMINB12, FOLATE, FERRITIN, TIBC, IRON, RETICCTPCT in the last 72 hours. Sepsis Labs: No results for input(s): PROCALCITON, LATICACIDVEN in the last 168 hours.  Recent Results (from the past 240 hour(s))  Culture, respiratory     Status: None   Collection Time: 05/03/20  9:44 AM   Specimen: Bronchoalveolar Lavage; Respiratory  Result Value Ref Range Status   Specimen Description   Final    BRONCHIAL ALVEOLAR LAVAGE RLL Performed at [facility] [address], [place], [place] [address]    Special Requests   Final    NONE Performed at [facility], [place] [address], [place], [place] [address]    Gram Stain   Final    RARE WBC PRESENT, PREDOMINANTLY PMN NO ORGANISMS SEEN    Culture   Final    RARE Normal respiratory flora-no Staph aureus or Pseudomonas seen Performed at [facility] [address], [place], [place] [address]    Report Status 05/05/2020 FINAL  Final  Acid Fast Smear (AFB)     Status: None   Collection Time: 05/03/20  9:44 AM   Specimen: Bronchial Alveolar Lavage  Result Value Ref Range Status   AFB Specimen Processing Concentration  Final   Acid Fast Smear Negative  Final    Comment: (NOTE) Performed At: [facility] [place], [place] [id number] Rush Farmer MD HQ:[id number]    Source (AFB) BRONCHIAL ALVEOLAR LAVAGE  Final    Comment: RLL Performed at [facility], [place] [address], [place], [place] [address]   Fungus Culture With Stain     Status: None (Preliminary result)   Collection Time: 05/03/20  9:44 AM   Specimen: Bronchial Alveolar Lavage  Result Value Ref Range Status   Fungus Stain Final report   Final    Comment: (NOTE) Performed At: [facility] [place], [place] [id number] Rush Farmer MD UY:[id number]    Fungus (Mycology) Culture PENDING  Incomplete   Fungal Source BRONCHIAL ALVEOLAR LAVAGE  Final    Comment: RLL Performed at [facility], [place] [address], [place], [place] [address]   Fungus Culture Result     Status: None   Collection Time: 05/03/20  9:44 AM  Result Value Ref Range Status   Result 1 Comment  Final    Comment: (NOTE) KOH/Calcofluor preparation:  no fungus observed. Performed At: [facility] [place], [place] [id number] Rush Farmer MD AC:[id number]       Radiology Studies: No results found.    Scheduled Meds: . chlorhexidine  15 mL Mouth Rinse BID  . Chlorhexidine Gluconate Cloth  6 each Topical Daily  . cholecalciferol  1,000 Units Per Tube Daily  . diltiazem  90 mg Per Tube Q6H  . enoxaparin (LOVENOX) injection  60 mg Subcutaneous Q24H  . feeding supplement (PROSource TF)  45 mL Per Tube BID  . free water  200 mL Per Tube Q4H  . insulin aspart  0-15 Units Subcutaneous Q6H  . insulin detemir  15 Units Subcutaneous QHS  . linagliptin  5 mg Per Tube Daily  . mouth rinse  15 mL Mouth Rinse q12n4p  . metoprolol tartrate  50 mg Per Tube Q8H  . multivitamin  15 mL Per Tube Daily  . pneumococcal 23 valent vaccine  0.5 mL Intramuscular Tomorrow-1000  . predniSONE  10 mg Per NG tube Daily  . sodium chloride flush  3 mL Intravenous Q12H   Continuous Infusions: . feeding supplement (VITAL 1.5 CAL) 1,000 mL (05/08/20 0030)      Assessment/Plan:  1.Acute hypoxemic respiratory failure due to bilateral COVID-19 pneumonia/ARDS: Required mechanical ventilation from 8/17 to 05/05/2020.  S/p treatment with remdesivir, steroids and neb treatments.  Not felt to be a candidate Baricitinib because of Crohn's disease. Now extubated and saturating well on room air.  On prednisone taper with 10  mg dosage for couple more days.  Received IV vancomycin during ICU course.  Finished empiric antibiotics with 10-day course of cefepime on 9/8.   2.  Acute renal failure mild hypernatremia: During ICU course, now resolved.  Continue tube feeds and free water.  Home medications-lisinopril, HCTZ and Aldactone on hold.  3. Acute metabolic encephalopathy: In the setting of problem #1.  Now resolved.  She appears overall deconditioned but mentating well, communicating appropriately.  4.  Type 2 diabetes mellitus, steroid-induced hyperglycemia: Lantus dose adjusted based on blood glucose readings while on tube feeds.  Also on linagliptin.  Nutrition following.  Speech therapy at bedside today and plan for MBS.  If does well can hopefully transition off tube feeds to oral diet soon.  5.  Hypertension: On beta-blockers (metoprolol) and Cardizem at baseline.  Can change to long-acting Cardizem once able to tolerate oral intake.  6.  Dysphagia: Likely functional due to prolonged intubation.  Hopefully will improve soon.  Speech therapy following.  Remains on tube feeds as discussed above.  7.  Crohn's disease, IBS: Patient having loose bowel movements today.?  On chronic prednisone at home (20 to 40 mg).  Will increase current prednisone dose to 20 mg.  Resume Pentasa  (1000 mg 4 times daily).  On Bentyl /Mercaptopurine at home as well.  8.?  History of COPD: Noted to be on Advair, albuterol inhaler per home meds.  Nebs as needed for now  9.  Depression: Noted to be on Cymbalta, trazodone at home.  10.  Hyperlipidemia: Resume fish oil, pravastatin when able to take p.o.  11.  Stage I sacral pressure wound: Foam dressing and air mattress requested.  Rectal tube until diarrhea improves.  12. Severe deconditioning: In the setting of prolonged intubation, ICU course.  PT/OT/speech therapy following along.  May need rehab prior to discharge home.  DVT prophylaxis: Lovenox Code Status: Full code Family /  Patient Communication: Discussed with patient Disposition Plan:   Status is: Inpatient  Remains inpatient appropriate because:Inpatient level of care appropriate due to severity of illness   Dispo: The patient is from: Home  Anticipated d/c is to: To be decided              Anticipated d/c date is: 3 days              Patient currently is not medically stable to d/c.          Time spent: 25 minutes     >50% time spent in discussions with care team and coordination of care.    Guilford Shi, MD [facility] Pager in [place]  If 7PM-7AM, please contact night-coverage [email or web address] 05/09/2020, 2:15 PM

## 2020-05-09 NOTE — Progress Notes (Signed)
  Speech Language Pathology Treatment: Dysphagia  Patient Details Name: [name] MRN: [id number] DOB: 1965/11/16 Today's Date: 05/09/2020 Time: 0712-1975 SLP Time Calculation (min) (ACUTE ONLY): 24 min  Assessment / Plan / Recommendation Clinical Impression  Today pt is alert and appears comfortable. Voice and cough remain marginally weak and pt continues to become dyspneic with minimal effort including eating approximately 7 bites of icecream and single ice chips x8.   Subtle throat clearing noted after 5 and 7th icecream bolus as well as cough x1.  Although pt is day 5 from extubation, continue to recommend MBS to assure readiness for po intake given level of deconditioning.  She will also continue to benefit from retention of feeding tube short term until she is able ot consume adequate nutrition without excessive dyspnea.  Using min cues, pt able to verbalize precautions.  Unfortunately the flouro room remains down and pt is unable to have her MBS today but hopefully it will be able to be conducted tomorrow.    HPI HPI: 54 yo female adm to [facility] on 8/21 from [place] after admitted there on 8/17 - after having COVID with respiratory failure.  Pt treated with remedsivir, prednisone, ABX for CAP.  Pt found to have ARDS, pneumonia, VDRF- intubated 8/21-9/4.  She has a small bore feeding tube in place. Swallow evaluation ordered.  Pt reports she is ready to eat and drink. Today seen to determine readiness for MBS and to assess progression of voice/swallowing.      SLP Plan  MBS (tomorrow early pm as xray schedule permits)       Recommendations  Diet recommendations: NPO (except ice chips and some ice cream from floor stock) Medication Administration: Via alternative means Supervision: Full supervision/cueing for compensatory strategies;Staff to assist with self feeding Compensations: Slow rate;Small sips/bites Postural Changes and/or Swallow Maneuvers: Seated upright 90 degrees                 Oral Care Recommendations: Oral care prior to ice chip/H20 Follow up Recommendations: Inpatient Rehab SLP Visit Diagnosis: Dysphagia, oropharyngeal phase (R13.12) Plan: MBS (tomorrow early pm as xray schedule permits)       GO                Macario Golds 05/09/2020, 6:39 PM   Kathleen Lime, MS [place] Office [phone]

## 2020-05-10 LAB — BASIC METABOLIC PANEL
Anion gap: 13 (ref 5–15)
BUN: 36 mg/dL — ABNORMAL HIGH (ref 6–20)
CO2: 22 mmol/L (ref 22–32)
Calcium: 8 mg/dL — ABNORMAL LOW (ref 8.9–10.3)
Chloride: 106 mmol/L (ref 98–111)
Creatinine, Ser: 1.36 mg/dL — ABNORMAL HIGH (ref 0.44–1.00)
GFR calc Af Amer: 51 mL/min — ABNORMAL LOW (ref >60)
GFR calc non Af Amer: 44 mL/min — ABNORMAL LOW (ref >60)
Glucose, Bld: 170 mg/dL — ABNORMAL HIGH (ref 70–99)
Potassium: 4.1 mmol/L (ref 3.5–5.1)
Sodium: 141 mmol/L (ref 135–145)

## 2020-05-10 LAB — GLUCOSE, CAPILLARY
Glucose-Capillary: 117 mg/dL — ABNORMAL HIGH (ref 70–99)
Glucose-Capillary: 127 mg/dL — ABNORMAL HIGH (ref 70–99)
Glucose-Capillary: 155 mg/dL — ABNORMAL HIGH (ref 70–99)
Glucose-Capillary: 160 mg/dL — ABNORMAL HIGH (ref 70–99)
Glucose-Capillary: 163 mg/dL — ABNORMAL HIGH (ref 70–99)

## 2020-05-10 MED ORDER — DEXTROSE-NACL 5-0.9 % IV SOLN: INTRAVENOUS | Status: AC

## 2020-05-10 NOTE — Progress Notes (Signed)
Inpatient Rehabilitation Admissions Coordinator  Patient not yet at a level to pursue CIR admit . I have notified TOC, Alinda Sierras, that I will follow up next week if patient remains in house.  Danne Baxter, RN, MSN Rehab Admissions Coordinator [phone] 05/10/2020 2:13 PM

## 2020-05-10 NOTE — Progress Notes (Signed)
SLP Cancellation Note  Patient Details Name: [name] MRN: [id number] DOB: 02-19-1966   Cancelled treatment:       Reason Eval/Treat Not Completed: Other (comment). Unable to complete MBS today, as fluoro room continues to be down. Will continue efforts.  Elaura Calix B. Quentin Ore, [facility], [place] Speech Language Pathologist Office: [phone]  Shonna Chock 05/10/2020, 8:51 AM

## 2020-05-10 NOTE — TOC Progression Note (Signed)
Transition of Care [facility]) - Progression Note    Patient Details  Name: [name] MRN: [id number] Date of Birth: 05/20/1966  Transition of Care [facility]) CM/SW Contact  Alaina Donati, Marjie Skiff, RN Phone Number: 05/10/2020, 2:37 PM  Clinical Narrative:    This CM met with pt at bedside to introduce myself and talk about dc planning. Physical therapy recommendations gone over with pt. Pt states that she understands the recommendations but feels that she would still like to go home. Pt states that she lives at home with her spouse who works. She states that she has lots of friends and family to help her and she states that she will have someone with her at home 24hrs a day. Pt states that she has a RW, wheelchair and 3in1 at home currently. Pt encouraged to talk to her husband about whether she needs a hospital bed. She states that she has a close friend that is a physical therapist and her son's spouse is a Marine scientist. This CM expressed concern over pt safety going directly home. Pt insistent that this is her plan. TOC will continue to follow. Would expect [facility] home health services would be needed if home is plan.   Barriers to Discharge: Continued Medical Work up      Social Determinants of Health (SDOH) Interventions    Readmission Risk Interventions Readmission Risk Prevention Plan 05/10/2020  Transportation Screening Complete  Medication Review Press photographer) Complete  PCP or Specialist appointment within 3-5 days of discharge Complete  Palliative Care Screening Not Applicable  Some recent data might be hidden

## 2020-05-10 NOTE — Plan of Care (Signed)
Pt VS WNL this am.  Flexiseal leaking and discontinued.  Minimal urine output overnight.   Problem: Clinical Measurements: Goal: Ability to maintain clinical measurements within normal limits will improve Outcome: Progressing Goal: Will remain free from infection Outcome: Progressing Goal: Diagnostic test results will improve Outcome: Progressing Goal: Respiratory complications will improve Outcome: Progressing Goal: Cardiovascular complication will be avoided Outcome: Progressing   Problem: Elimination: Goal: Will not experience complications related to urinary retention Outcome: Progressing   Problem: Pain Managment: Goal: General experience of comfort will improve Outcome: Progressing   Problem: Safety: Goal: Ability to remain free from injury will improve Outcome: Progressing   Problem: Skin Integrity: Goal: Risk for impaired skin integrity will decrease Outcome: Progressing   Problem: Education: Goal: Knowledge of General Education information will improve Description: Including pain rating scale, medication(s)/side effects and non-pharmacologic comfort measures Outcome: Not Progressing   Problem: Health Behavior/Discharge Planning: Goal: Ability to manage health-related needs will improve Outcome: Not Progressing   Problem: Activity: Goal: Risk for activity intolerance will decrease Outcome: Not Progressing   Problem: Nutrition: Goal: Adequate nutrition will be maintained Outcome: Not Progressing   Problem: Coping: Goal: Level of anxiety will decrease Outcome: Not Progressing   Problem: Elimination: Goal: Will not experience complications related to bowel motility Outcome: Not Progressing

## 2020-05-10 NOTE — Progress Notes (Signed)
Physical Therapy Treatment Patient Details Name: [name] MRN: [id number] DOB: [birth year] Today's Date: 05/10/2020    History of Present Illness 54 yo female tested positive for COVID 19 on 8/09.  Had progressive symptoms and admitted to [facility] on 8/17.  Treated with remdesivir, prednisone and ABx for CAP.  She developed worsening hypoxia and required intubation 04/21/20 AND  was transferred TO [facility]  with ARDS. Extubated 05/04/20.  HWK:GSUPJ'S colitis, Carpal tunnel, Osteoarthritis, DM type 2, Asthma, HTN, HLD, OSA, Morbid obesity with BMI 49.64, Depression    PT Comments    First assisted OOB ro recliner via [employer].  Then was able to perform some TE's.  X 15 reps head rotation, X 10 reps B UE shoulder flex (limited only 25%), shoulder ABD (25%) and elbow flex/ext (25%) limited by weakness and more so because of EDEMA.  B pillows are needed to elevate elbows so that pt can have more self movement at forearm/wrist and hands.  R UE (dominate) weaker that L.  Unable to grasp and hold a cup and unable to bring to mouth (ice chips).  Also performed some B LE TE's of 15 reps heel slides AAROM (pt 75%), ABd/ADd AAROM (pt 75%), towel squeeze (pt 90%), LAQ's in seated position AROM (pt 95%) and knee presses (pt 85%).  B ankle pumps required AROM (pt 5%).  Positioned upright in recliner to comfort and elevated B UE's such that pt could push soft touch call light and also gave pt a make shift (hand crafted by me) fat elongated handle spoon with a shallow bowel of ICE chips.  Pt required hand over hand assist using L UE to scoop ice chips placed really close at chest level to her mouth with 50% self completion.  Performed some sitting/trunk  Activities of x 5 reps partial "sit ups" (pt 35%) mainly limited by ABD girth/edema.  Left pt in recliner and instructed NT to use lift to assist pt back to bed. Pt is VERY motivated to do/try more.  "I want to be able to get on the commode".  "I want to get out  of bed every day".  "I want to be able to eat".  Pt currently NPO.     Follow Up Recommendations        Equipment Recommendations       Recommendations for Other Services Rehab consult     Precautions / Restrictions Precautions Precautions: Fall Precaution Comments: post VDRF, SIRS, B foot drop Restrictions Weight Bearing Restrictions: No    Mobility  Bed Mobility Overal bed mobility: Needs Assistance Bed Mobility: Rolling Rolling: +2 for safety/equipment;+2 for physical assistance;Max assist         General bed mobility comments: side to side rolling to place Maxi Move pad under pt  Transfers Overall transfer level: Needs assistance               General transfer comment: used [place] to assist OOB to [place]  Ambulation/Gait             General Gait Details: non amb since admitted to [facility] ED Aug 9th   Stairs             Wheelchair Mobility    Modified Rankin (Stroke Patients Only)       Balance  Cognition Arousal/Alertness: Awake/alert Behavior During Therapy: WFL for tasks assessed/performed Overall Cognitive Status: Within Functional Limits for tasks assessed                                 General Comments: AxO x 3 very pleasant, aware of her current medical condition and motivated to "get better".      Exercises  (see above)    General Comments        Pertinent Vitals/Pain Pain Assessment: No/denies pain    Home Living                      Prior Function            PT Goals (current goals can now be found in the care plan section) Progress towards PT goals: Progressing toward goals    Frequency    Min 3X/week      PT Plan Current plan remains appropriate    Co-evaluation              AM-PAC PT "6 Clicks" Mobility   Outcome Measure  Help needed turning from your back to your side while in a flat  bed without using bedrails?: A Lot Help needed moving from lying on your back to sitting on the side of a flat bed without using bedrails?: A Lot Help needed moving to and from a bed to a chair (including a wheelchair)?: Total Help needed standing up from a chair using your arms (e.g., wheelchair or bedside chair)?: Total Help needed to walk in hospital room?: Total Help needed climbing 3-5 steps with a railing? : Total 6 Click Score: 8    End of Session   Activity Tolerance: Patient tolerated treatment well Patient left: in chair;with call bell/phone within reach Nurse Communication: Mobility status PT Visit Diagnosis: Unsteadiness on feet (R26.81);Muscle weakness (generalized) (M62.81);Other symptoms and signs involving the nervous system (R29.898);Difficulty in walking, not elsewhere classified (R26.2)     Time: 1200-1250 PT Time Calculation (min) (ACUTE ONLY): 50 min  Charges:  $Therapeutic Exercise: 23-37 mins $Therapeutic Activity: 23-37 mins                     Rica Koyanagi  PTA Acute  Rehabilitation Services Pager      [phone] Office      [phone]

## 2020-05-10 NOTE — Progress Notes (Signed)
Pt called RN into room and stated that she would like to leave AMA.  Case management had informed her earlier in the afternoon that she would need to be discharged to SNF.  She stated that she would refuse and wanted to go home immediately.  Pt no longer wished to have tube feeds due to profuse diarrhea.  RN contacted Dr. Earnest Conroy, who urged patient to stay overnight and have speech eval completed in order to properly evaluate her potential for aspiration prior to removing tube feeds and beginning a diet.  Pt agreed to stay overnight but insisted on tube feeds being halted.  RN stopped tube feeds and per conversation with Dr. Earnest Conroy will instruction nightshift RN to contact on call coverage to begin fluids overnight for pt.

## 2020-05-10 NOTE — Progress Notes (Signed)
PROGRESS NOTE    [name]  MVE:[id number]  DOB: 1965/11/22  PCP: Cheryl Bis, MD Admit date:04/20/2020 Chief complaint: Shortness of breath Hospital course: 54 year old female with obesity, hypertension, diabetes, Crohn's disease, unvaccinated for COVID-19 was diagnosed with COVID-19 illness with positive test on 8/9-> on 8/17 admitted to [facility]  with progressive shortness of breath where she developed ARDS, intubated . On 8/22 , she was transferred to [facility] . 8/22-9/5,  continued mechanical intubation, prolonged recovery.  On broad-spectrum antibiotics and Covid antiviral therapy as chest x-ray showed bilateral infiltrates.  She was extubated on 9/4 with 8 L O2 nasal cannula requirement, transitioned to room air and transferred to [facility] service on 9/6.  Subjective:  Patient appears to be more energetic and communicative today.  She however is still very deconditioned.  Remains on core track.  States she had ice cream yesterday during speech evaluation and felt very good.  She did not have any trouble swallowing mesalamine with applesauce this morning per bedside nurse.  Unfortunately MBS could not be completed yesterday as equipment down in radiology.  Reports several loose stools which is not unusual for her with Crohn's disease.  Rectal tube discontinued this morning as was leaking around it.  Objective: Vitals:   05/09/20 1642 05/09/20 2125 05/10/20 0602 05/10/20 1340  BP: (!) 137/50 (!) 153/70 (!) 143/70 (!) 143/59  Pulse: 93 88 83 88  Resp: _0 Temp: 99.5 F (37.5 C) (!) 97.5 F (36.4 C) (!) 97.5 F (36.4 C) 98.3 F (36.8 C)  TempSrc: Oral Oral Oral Oral  SpO2: 94% 94% 94% 92%  Weight:      Height:        Intake/Output Summary (Last 24 hours) at 05/10/2020 1605 Last data filed at 05/10/2020 1445 Gross per 24 hour  Intake 60 ml  Output 500 ml  Net -440 ml   Filed Weights   05/07/20 0500 05/08/20 0500 05/09/20 0500  Weight: 122.6  kg 121.3 kg 121.3 kg    Physical Examination:  General: Obese female, no acute distress noted, appears more energetic Head ENT: NG tube in place, obese neck, PERRLA Heart: S1-S2 heard, regular rate and rhythm, no murmurs.   Lungs: Decreased breath sounds at bases, no rhonchi or rales on exam, no accessory muscle use Abdomen: Bowel sounds heard, soft, nontender, nondistended. No organomegaly.  No CVA tenderness Extremities: 1+ pitting leg edema noted bilaterally, no cyanosis or clubbing. Neurological: Awake alert oriented x3, appears deconditioned with generalized weakness, no focal deficits  Skin: Mild excoriation and stage I sacral pressure injury    Data Reviewed: I have personally reviewed following labs and imaging studies  CBC: Recent Labs  Lab 05/04/20 0500 05/05/20 0430 05/06/20 0353 05/07/20 0456 05/08/20 0412  WBC 9.8 14.8* 15.7* 14.0* 13.7*  NEUTROABS 7.3 10.0* 10.9*  --  9.5*  HGB 7.2* 11.0* 11.9* 10.8* 11.3*  HCT 23.9* 34.8* 35.7* 33.4* 33.6*  MCV 110.1* 106.1* 102.3* 103.1* 101.2*  PLT 70* 125* 140* 146* 283*   Basic Metabolic Panel: Recent Labs  Lab 05/06/20 0353 05/07/20 0456 05/08/20 0412 05/09/20 0602 05/10/20 0636  NA 145 145 139 138 141  K 3.3* 3.6 3.5 3.6 4.1  CL 111 110 110 109 106  CO2 _1 GLUCOSE 114* 121* 185* 169* 170*  BUN 26* 25* 20 20 36*  CREATININE <0.30* 0.33* <0.30* 0.45 1.36*  CALCIUM 7.6* 7.9* 7.8* 8.0* 8.0*  MG 2.1  --  2.0  --   --   PHOS  --   --  3.3  --   --    GFR: Estimated Creatinine Clearance: 60.4 mL/min (A) (by C-G formula based on SCr of 1.36 mg/dL (H)). Liver Function Tests: Recent Labs  Lab 05/06/20 0353  AST 27  ALT 44  ALKPHOS 45  BILITOT 0.7  PROT 5.1*  ALBUMIN 2.1*   No results for input(s): LIPASE, AMYLASE in the last 168 hours. No results for input(s): AMMONIA in the last 168 hours. Coagulation Profile: No results for input(s): INR, PROTIME in the last 168 hours. Cardiac Enzymes: No  results for input(s): CKTOTAL, CKMB, CKMBINDEX, TROPONINI in the last 168 hours. BNP (last 3 results) No results for input(s): PROBNP in the last 8760 hours. HbA1C: No results for input(s): HGBA1C in the last 72 hours. CBG: Recent Labs  Lab 05/09/20 1742 05/10/20 0011 05/10/20 0605 05/10/20 0719 05/10/20 1216  GLUCAP 138* 117* 163* 127* 155*   Lipid Profile: No results for input(s): CHOL, HDL, LDLCALC, TRIG, CHOLHDL, LDLDIRECT in the last 72 hours. Thyroid Function Tests: No results for input(s): TSH, T4TOTAL, FREET4, T3FREE, THYROIDAB in the last 72 hours. Anemia Panel: No results for input(s): VITAMINB12, FOLATE, FERRITIN, TIBC, IRON, RETICCTPCT in the last 72 hours. Sepsis Labs: No results for input(s): PROCALCITON, LATICACIDVEN in the last 168 hours.  Recent Results (from the past 240 hour(s))  Culture, respiratory     Status: None   Collection Time: 05/03/20  9:44 AM   Specimen: Bronchoalveolar Lavage; Respiratory  Result Value Ref Range Status   Specimen Description   Final    BRONCHIAL ALVEOLAR LAVAGE RLL Performed at [facility] [address] [place], [place] [address]    Special Requests   Final    NONE Performed at [facility], [place] [address], [place], [place] [address]    Gram Stain   Final    RARE WBC PRESENT, PREDOMINANTLY PMN NO ORGANISMS SEEN    Culture   Final    RARE Normal respiratory flora-no Staph aureus or Pseudomonas seen Performed at [facility] [address] [place], [place] [address]    Report Status 05/05/2020 FINAL  Final  Acid Fast Smear (AFB)     Status: None   Collection Time: 05/03/20  9:44 AM   Specimen: Bronchial Alveolar Lavage  Result Value Ref Range Status   AFB Specimen Processing Concentration  Final   Acid Fast Smear Negative  Final    Comment: (NOTE) Performed At: [facility] [place], [place] [id number] Rush Farmer MD [id number]    Source  (AFB) BRONCHIAL ALVEOLAR LAVAGE  Final    Comment: RLL Performed at [facility], [place] [address], [place], [place] [address]   Fungus Culture With Stain     Status: None (Preliminary result)   Collection Time: 05/03/20  9:44 AM   Specimen: Bronchial Alveolar Lavage  Result Value Ref Range Status   Fungus Stain Final report  Final    Comment: (NOTE) Performed At: [facility] [place], [place] [id number] Rush Farmer MD [id number]    Fungus (Mycology) Culture PENDING  Incomplete   Fungal Source BRONCHIAL ALVEOLAR LAVAGE  Final    Comment: RLL Performed at [facility], [place] [address], [place], [place] [address]   Fungus Culture Result     Status: None   Collection Time: 05/03/20  9:44 AM  Result Value Ref Range  Status   Result 1 Comment  Final    Comment: (NOTE) KOH/Calcofluor preparation:  no fungus observed. Performed At: [facility] [place], [place] [id number] Rush Farmer MD [id number]       Radiology Studies: No results found.    Scheduled Meds: . chlorhexidine  15 mL Mouth Rinse BID  . Chlorhexidine Gluconate Cloth  6 each Topical Daily  . cholecalciferol  1,000 Units Per Tube Daily  . diltiazem  90 mg Per Tube Q6H  . enoxaparin (LOVENOX) injection  60 mg Subcutaneous Q24H  . feeding supplement (PROSource TF)  45 mL Per Tube BID  . free water  200 mL Per Tube Q4H  . insulin aspart  0-15 Units Subcutaneous Q6H  . insulin detemir  15 Units Subcutaneous QHS  . linagliptin  5 mg Per Tube Daily  . mouth rinse  15 mL Mouth Rinse q12n4p  . mesalamine  1,000 mg Oral QID  . metoprolol tartrate  50 mg Per Tube Q8H  . multivitamin  15 mL Per Tube Daily  . pneumococcal 23 valent vaccine  0.5 mL Intramuscular Tomorrow-1000  . predniSONE  20 mg Per NG tube Daily  . sodium chloride flush  3 mL Intravenous Q12H   Continuous Infusions: . feeding supplement (VITAL 1.5 CAL) 1,000  mL (05/08/20 0030)      Assessment/Plan:  1.Acute hypoxemic respiratory failure due to bilateral COVID-19 pneumonia/ARDS: Required mechanical ventilation from 8/17 to 05/05/2020.  S/p treatment with remdesivir, steroids and neb treatments.  Not felt to be a candidate for baricitinib because of Crohn's disease. Now extubated and saturating well on room air.  On prednisone taper down with home dose of 20 mg now.  Received IV vancomycin during ICU course.  Finished empiric antibiotics with 10-day course of cefepime on 9/8.   2.  Acute renal failure mild hypernatremia: During ICU course, now resolved.  Continue tube feeds and free water.  Home medications-lisinopril, HCTZ and Aldactone on hold.  3. Acute metabolic encephalopathy: In the setting of problem #1.  Now resolved.  She appears overall deconditioned but mentating well, communicating appropriately.  4.  Type 2 diabetes mellitus, steroid-induced hyperglycemia: Lantus dose adjusted based on blood glucose readings while on tube feeds.  Also on linagliptin.  Nutrition following.    5.  Hypertension: On beta-blockers (metoprolol) and Cardizem at baseline.  Can change to long-acting Cardizem once able to tolerate oral intake.  6.  Dysphagia: Likely functional due to prolonged intubation.  Hopefully will improve soon.  Speech therapy following. Speech therapy at bedside today and plan for MBS-equipment still down today, not sure if can be done at [facility] and brought back.  If does well can hopefully transition off tube feeds to oral diet soon (she is anxious to eat at least pured diet-will discuss with ST)  7.  Crohn's disease, IBS: Patient having loose bowel movements today.?  On chronic prednisone at home (20 to 40 mg).  Will increase current prednisone dose to 20 mg.  Resume Pentasa  (1000 mg 4 times daily).  On Bentyl /Mercaptopurine at home as well.  Loperamide as needed ordered.  Now off rectal tube.  States mesalamine helping ( given with  applesauce as cannot be given via NG tube)  8.?  History of COPD: Noted to be on Advair, albuterol inhaler per home meds.  Nebs as needed for now  9.  Depression: Noted to be on Cymbalta, trazodone at home.  10.  Hyperlipidemia: Resume fish  oil, pravastatin when able to take p.o.  11.  Stage I sacral pressure wound: Foam dressing and air mattress requested.  Rectal tube until diarrhea improves.  12. Severe deconditioning: In the setting of prolonged intubation, ICU course.  PT/OT/speech therapy following along.  May need rehab prior to discharge home.  DVT prophylaxis: Lovenox Code Status: Full code Family / Patient Communication: Discussed with patient Disposition Plan:   Status is: Inpatient  Remains inpatient appropriate because:Inpatient level of care appropriate due to severity of illness   Dispo: The patient is from: Home              Anticipated d/c is to: To be decided              Anticipated d/c date is: 3 days              Patient currently is not medically stable to d/c.          Time spent: 25 minutes     >50% time spent in discussions with care team and coordination of care.    Guilford Shi, MD [facility] Pager in [place]  If 7PM-7AM, please contact night-coverage [email or web address] 05/10/2020, 4:05 PM

## 2020-05-11 LAB — CBC
HCT: 32.6 % — ABNORMAL LOW (ref 36.0–46.0)
Hemoglobin: 10.5 g/dL — ABNORMAL LOW (ref 12.0–15.0)
MCH: 33 pg (ref 26.0–34.0)
MCHC: 32.2 g/dL (ref 30.0–36.0)
MCV: 102.5 fL — ABNORMAL HIGH (ref 80.0–100.0)
Platelets: 131 10*3/uL — ABNORMAL LOW (ref 150–400)
RBC: 3.18 MIL/uL — ABNORMAL LOW (ref 3.87–5.11)
RDW: 14 % (ref 11.5–15.5)
WBC: 9.6 10*3/uL (ref 4.0–10.5)
nRBC: 0 % (ref 0.0–0.2)

## 2020-05-11 LAB — BASIC METABOLIC PANEL
Anion gap: 13 (ref 5–15)
BUN: 33 mg/dL — ABNORMAL HIGH (ref 6–20)
CO2: 22 mmol/L (ref 22–32)
Calcium: 8.5 mg/dL — ABNORMAL LOW (ref 8.9–10.3)
Chloride: 109 mmol/L (ref 98–111)
Creatinine, Ser: 0.75 mg/dL (ref 0.44–1.00)
GFR calc Af Amer: >60 mL/min (ref >60)
GFR calc non Af Amer: >60 mL/min (ref >60)
Glucose, Bld: 141 mg/dL — ABNORMAL HIGH (ref 70–99)
Potassium: 3.9 mmol/L (ref 3.5–5.1)
Sodium: 144 mmol/L (ref 135–145)

## 2020-05-11 LAB — GLUCOSE, CAPILLARY
Glucose-Capillary: 123 mg/dL — ABNORMAL HIGH (ref 70–99)
Glucose-Capillary: 107 mg/dL — ABNORMAL HIGH (ref 70–99)
Glucose-Capillary: 159 mg/dL — ABNORMAL HIGH (ref 70–99)
Glucose-Capillary: 93 mg/dL (ref 70–99)

## 2020-05-11 MED ORDER — DILTIAZEM HCL ER BEADS 240 MG PO CP24: 360.0000 mg | ORAL_CAPSULE | Freq: Every day | ORAL | Status: DC

## 2020-05-11 MED ORDER — DILTIAZEM HCL ER COATED BEADS 180 MG PO CP24
360.0000 mg | ORAL_CAPSULE | Freq: Every day | ORAL | Status: DC
  Administered 2020-05-11 – 2020-05-16 (×6): 360 mg via ORAL
  Filled 2020-05-11 (×8): qty 2

## 2020-05-11 MED ORDER — DEXTROSE-NACL 5-0.9 % IV SOLN: INTRAVENOUS | Status: AC

## 2020-05-11 MED ORDER — DULOXETINE HCL 60 MG PO CPEP
60.0000 mg | ORAL_CAPSULE | Freq: Every day | ORAL | Status: DC
  Administered 2020-05-11 – 2020-05-16 (×6): 60 mg via ORAL
  Filled 2020-05-11 (×6): qty 1

## 2020-05-11 NOTE — Progress Notes (Signed)
  Speech Language Pathology Treatment: Dysphagia  Patient Details Name: [name] MRN: [id number] DOB: October 30, 1965 Today's Date: 05/11/2020 Time: 5945-8592 SLP Time Calculation (min) (ACUTE ONLY): 30 min  Assessment / Plan / Recommendation Clinical Impression  ST follow up to assess for PO readiness following lengthy intubation due to COVID-19 respiratory failure with ARDS.  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   Cranial nerve exam was repeated and unremarkable.  Lingual, labial, facial and jaw range of motion and strength appeared to be adequate.  Facial sensation appeared to be intact and she did not endorse a difference in sensation from the right to left side of her face.   The patient reported she was doing well on the ice chips with limited coughing.  She was presented with thin liquids via spoon, cup and straw, pureed material and dry solids.  Mastication of dry solids appeared to be adequate with no oral residue seen post swallow.  Swallow trigger was appreciated to palpation and appeared to be swift given intake of thin liquids.  Very inconsistent throat clear was seen that was diffiuclt to tell if it was related to PO intake.  She was able to continuously drink 3 ozs of thin liquids via straw without any immediate cough/throat clear noted.  Will plan to cautiously initiate a dysphagia 2 diet with thin liquids.  RN and CNA were verbally given her safe swallow precautions and signs were left in the room.  Given her lengthy intubation there is concern for post extubation dysphagia.  Patient was instructed to brush her teeth PRIOR to her meals to mitigate risk of any aspiration should it occur.  She was also instructed to take single, small sips via a straw.  She was able to verbally state she knew we were concerned about aspiration.   ST will plan for MBS on  Monday if the fluoro tower is functional.     HPI HPI: 54 yo female adm to [facility] on 8/21 from [place] after admitted there on 8/17 - after having COVID with respiratory failure.  Pt treated with remedsivir, prednisone, ABX for CAP.  Pt found to have ARDS, pneumonia, VDRF- intubated 8/21-9/4.  She has a small bore feeding tube in place. Pt is anxious  to eat and drink and have coretrak removed.      SLP Plan  MBS       Recommendations  Diet recommendations: Dysphagia 2 (fine chop);Thin liquid Liquids provided via: Straw (given small, single sips) Medication Administration: Via alternative means Supervision: Trained caregiver to feed patient Compensations: Slow rate;Small sips/bites Postural Changes and/or Swallow Maneuvers: Seated upright 90 degrees;Upright 30-60 min after meal                General recommendations: Rehab consult Oral Care Recommendations: Oral care QID;Oral care before and after PO Follow up Recommendations: Inpatient Rehab SLP Visit Diagnosis: Dysphagia, oropharyngeal phase (R13.12) Plan: MBS       GO               Shelly Flatten, MA, CCC-SLP Acute Rehab SLP [phone]  Lamar Sprinkles 05/11/2020, 10:31 AM

## 2020-05-11 NOTE — Progress Notes (Signed)
Occupational Therapy Treatment Patient Details Name: [name] MRN: [id number] DOB: 1966-08-09 Today's Date: 05/11/2020    History of present illness 54 yo female tested positive for COVID 19 on 8/09.  Had progressive symptoms and admitted to [facility] on 8/17.  Treated with remdesivir, prednisone and ABx for CAP.  She developed worsening hypoxia and required intubation 04/21/20 AND  was transferred TO [facility]  with ARDS. Extubated 05/04/20.  OZD:GUYQI'H colitis, Carpal tunnel, Osteoarthritis, DM type 2, Asthma, HTN, HLD, OSA, Morbid obesity with BMI 49.64, Depression   OT comments  Treatment focused on bed mobility, standing, and active use of upper extremities. Patient max assist to transfer to edge of bed with therapist encouraging movement of each extremity prior to assisting patient. Patient sat edge of bed using arms to prop or hold onto bed rail. Attempted standing with RW with max x 2 assist and unable to power up. Used stedy with patient holding on to front bar and high perch position on bed to stand with mod assist x 2. Patient exhibited poor upper body strength and tolerance and wanting to bear weight through her forearms on stedy. Patient exhibited increased respirations and anxiety with standing and activity. Patient able to stand again from stedy to remove flaps and sit in recliner. Therapist educated patient and spouse to perform extremity movement frequently and for patient to attempt to perform functional tasks as much as possible. Continue to recommend aggressive short term rehab at discharge. Patient motivated.   Follow Up Recommendations  CIR    Equipment Recommendations   (TBD)    Recommendations for Other Services      Precautions / Restrictions Precautions Precautions: Fall Precaution Comments: post VDRF, SIRS, B foot drop Required Braces or Orthoses: Other Brace Other Brace: prevalon boots Restrictions Weight Bearing Restrictions: No       Mobility Bed  Mobility Overal bed mobility: Needs Assistance Bed Mobility: Supine to Sit     Supine to sit: Max assist     General bed mobility comments: Max assist to transfer to side of bed - needing acitve assist for LEs, trunk lift off, use of bed pad to pivot hips to edge of bed.  Transfers Overall transfer level: Needs assistance Equipment used: Rolling walker (2 wheeled) Transfers: Sit to/from Stand Sit to Stand: From elevated surface;+2 safety/equipment;+2 physical assistance;Mod assist         General transfer comment: Attempted sit to stand with RW with +2 max assist. Unable to power up. Used stedy and high perch position to perform sit to stand with mod x 2 from bed. Mod x 2 from stedy to stand and lower to recilner.    Balance Overall balance assessment: Needs assistance Sitting-balance support: Single extremity supported;Feet supported Sitting balance-Leahy Scale: Poor Sitting balance - Comments: Patient holding onto bed rail with right hand. Min guard for safety.                                   ADL either performed or assessed with clinical judgement   ADL                                               Vision Patient Visual Report: No change from baseline     Perception     Praxis  Cognition Arousal/Alertness: Awake/alert Behavior During Therapy: WFL for tasks assessed/performed Overall Cognitive Status: Within Functional Limits for tasks assessed                                          Exercises Other Exercises Other Exercises: Reaching for stedy bar with the barest of active assist from therapist x 3. Other Exercises: Educated patient on exercises to perform while seated in chair.   Shoulder Instructions       General Comments      Pertinent Vitals/ Pain       Pain Assessment: No/denies pain  Home Living                                          Prior Functioning/Environment               Frequency  Min 2X/week        Progress Toward Goals  OT Goals(current goals can now be found in the care plan section)  Progress towards OT goals: Progressing toward goals  Acute Rehab OT Goals Patient Stated Goal: To be independent OT Goal Formulation: With patient Time For Goal Achievement: 04/16/20 Potential to Achieve Goals: Good  Plan Discharge plan remains appropriate    Co-evaluation          OT goals addressed during session: Strengthening/ROM (functional mobility)      AM-PAC OT "6 Clicks" Daily Activity     Outcome Measure   Help from another person eating meals?: A Lot Help from another person taking care of personal grooming?: Total Help from another person toileting, which includes using toliet, bedpan, or urinal?: Total Help from another person bathing (including washing, rinsing, drying)?: A Lot Help from another person to put on and taking off regular upper body clothing?: Total Help from another person to put on and taking off regular lower body clothing?: Total 6 Click Score: 8    End of Session Equipment Utilized During Treatment: Other (comment);Rolling walker (stedy)  OT Visit Diagnosis: Muscle weakness (generalized) (M62.81);Other abnormalities of gait and mobility (R26.89);Other symptoms and signs involving cognitive function   Activity Tolerance Patient tolerated treatment well   Patient Left in chair;with call bell/phone within reach;with chair alarm set;with family/visitor present   Nurse Communication Mobility status        Time: 0981-1914 OT Time Calculation (min): 25 min  Charges: OT General Charges $OT Visit: 1 Visit OT Treatments $Therapeutic Activity: 23-37 mins  Vonda, OTR/L [place]  Office [phone] Pager: [place] 05/11/2020, 3:31 PM

## 2020-05-11 NOTE — Progress Notes (Addendum)
PROGRESS NOTE    [name]  RJJ:[id number] DOB: 1965-10-24 DOA: 04/20/2020 PCP: Caryl Bis, MD  Brief Narrative:  54 year old community dwelling white female severe obesity BMI >47 DM TY two HTN right hip repair 2013, carpal tunnel release 09/2019 with other multiple surgeries 10/2019 Crohn's colitis since 1998 followed by Dr. Melony Overly on Pentasa currently, irritable bowel syndrome  Tested positive Covid 04/08/2020 admitted [facility] 04/16/2020-intubated for VDRF ARDS-long hospital course until extubation 05/04/2020--completed remdesivir, steroids, cefepime Not given Actemra secondary to Crohn's Transferred [facility] 05/06/2020 on 8 L nasal cannula Has core track in place but speech therapy felt she was at high risk for swallowing given dysphagia and prolonged intubation Rectal tube discontinued 9/10 for likely feed associated diarrhea but this has subsequently resolved to some degree with Loperamide  Requested to leave AMA because of PEG feeds 9/10 but fortunately elected to stay Has been refused for CIR and is insistent on going home instead of nursing facility so [facility] will be talking to her on Monday    Assessment & Plan:   Principal Problem:   ARDS (adult respiratory distress syndrome) ([place]) Active Problems:   Hypertension complicating diabetes ([place])   Crohn disease ([place])   IBS (irritable bowel syndrome)   Physical deconditioning   Dysphagia   Pressure injury of sacral region, stage 1   1. COVID-19 with ARDS acute respiratory (VDRF 8/17 through 9/5) failure now recovery a. Status post goal-directed therapies for Covid given other than baricitinib b. Tapering prednisone to 20 mg c. Received vancomycin/cefepime antibiotics finishing 9/8 d. Continues to recover slowly-is on room air 2. Severe dysphagia n.p.o. at present a. Wanted to leave [place] 9/10 because of discomfort with  NG tube b. Appreciate speech input, graduating dysphagia two with  strict instructions that if coughing or aspiration might require replacement of NG-she clearly understands and her husband verbalized understanding in addition c. Periodic check CXR if needed 3. Tube feed associated diarrhea a. Not infectious-now resolved-continue Loperamide 2 mg every 8 4. AKI with hypernatremia a. Free water and tube feeds given previously when she had NG tube b. AKI slowly getting better c. Recheck labs in a.m. and continue D5 NS 50 cc/h right now 5. DM TY 2 exacerbated by steroids' a. Continue linagliptin 5 mg, Levemir 15 in addition to sliding scale b. Continue fluids as above c. Sugars are ranging 10 7-1 41 6. Crohn's disease + IBS followed by Dr. Melony Overly previously on Pentasa a. Continue Pentasa 1000 4 times daily and outpatient follow-up with him b. Continue prednisone 20 7. HTN 8. Sinus tachycardia a. Continue metoprolol 50 every 8,-switched her Cardizem liquid to Tiazac 360 usual home dose b. We will start implementing some of her oral meds going forward in the next several days   DVT prophylaxis: Lovenox Code Status: Full  family Communication: Discussed with the husband at the bedside  Disposition: Inpatient  Status is: Inpatient  Remains inpatient appropriate because:Hemodynamically unstable, Ongoing active pain requiring inpatient pain management, Altered mental status and Unsafe d/c plan   Dispo: The patient is from: Home              Anticipated d/c is to: Home              Anticipated d/c date is: 2 days              Patient currently is not medically stable to d/c.       Consultants:   Multiple  Procedures: Multiple  Antimicrobials: None currently   Subjective: She is working with therapy is very debilitated and weak in her right arm She is happy to have had a meal which is relatively untouched she only ate about 20% of it We had a long discussion about the NG tube She has no chest pain  Objective: Vitals:   05/10/20 1340  05/10/20 1827 05/10/20 2100 05/11/20 0700  BP: (!) 143/59 (!) 152/72 (!) 150/69 (!) 166/79  Pulse: 88 90 89 100  Resp: 20  19 20   Temp: 98.3 F (36.8 C)  98.1 F (36.7 C) 98.7 F (37.1 C)  TempSrc: Oral  Oral Oral  SpO2: 92%  93% 92%  Weight:      Height:        Intake/Output Summary (Last 24 hours) at 05/11/2020 0829 Last data filed at 05/11/2020 0809 Gross per 24 hour  Intake 878.84 ml  Output 850 ml  Net 28.84 ml   Filed Weights   05/07/20 0500 05/08/20 0500 05/09/20 0500  Weight: 122.6 kg 121.3 kg 121.3 kg    Examination:  General exam: EOMI NCAT NG tube in place not on oxygen super obese pleasant Respiratory system: Clear without added sounds posterolaterally Cardiovascular system: S1-S2 slight tachycardia Gastrointestinal system: Obese nontender poor exam secondary to habitus. Central nervous system: Intact no focal deficit Extremities: Grade 1 lower extremity edema Skin: As above Psychiatry: Euthymic slight anxious but otherwise pleasant conversant coherent  Data Reviewed: I have personally reviewed following labs and imaging studies BUNs/creatinine trending 33/0.7 (baseline 20/less than 0.3) White count 13.7-->9.6 Hemoglobin 10.5 Platelet 131   Radiology Studies: No results found.   Scheduled Meds: . chlorhexidine  15 mL Mouth Rinse BID  . Chlorhexidine Gluconate Cloth  6 each Topical Daily  . cholecalciferol  1,000 Units Per Tube Daily  . diltiazem  90 mg Per Tube Q6H  . enoxaparin (LOVENOX) injection  60 mg Subcutaneous Q24H  . insulin aspart  0-15 Units Subcutaneous Q6H  . insulin detemir  15 Units Subcutaneous QHS  . linagliptin  5 mg Per Tube Daily  . mouth rinse  15 mL Mouth Rinse q12n4p  . mesalamine  1,000 mg Oral QID  . metoprolol tartrate  50 mg Per Tube Q8H  . multivitamin  15 mL Per Tube Daily  . pneumococcal 23 valent vaccine  0.5 mL Intramuscular Tomorrow-1000  . predniSONE  20 mg Per NG tube Daily  . sodium chloride flush  3 mL  Intravenous Q12H   Continuous Infusions: . dextrose 5 % and 0.9% NaCl    . feeding supplement (VITAL 1.5 CAL) Stopped (05/10/20 1800)     LOS: 21 days    Time spent: [place], MD [facility] To contact the attending provider between 7A-7P or the covering provider during after hours 7P-7A, please log into the web site [email or web address] and access using universal [place] password for that web site. If you do not have the password, please call the hospital operator.  05/11/2020, 8:29 AM

## 2020-05-12 LAB — CBC WITH DIFFERENTIAL/PLATELET
Abs Immature Granulocytes: 0.1 10*3/uL — ABNORMAL HIGH (ref 0.00–0.07)
Basophils Absolute: 0 10*3/uL (ref 0.0–0.1)
Basophils Relative: 0 %
Eosinophils Absolute: 0.1 10*3/uL (ref 0.0–0.5)
Eosinophils Relative: 1 %
HCT: 32.4 % — ABNORMAL LOW (ref 36.0–46.0)
Hemoglobin: 10.7 g/dL — ABNORMAL LOW (ref 12.0–15.0)
Immature Granulocytes: 1 %
Lymphocytes Relative: 15 %
Lymphs Abs: 1.4 10*3/uL (ref 0.7–4.0)
MCH: 33.8 pg (ref 26.0–34.0)
MCHC: 33 g/dL (ref 30.0–36.0)
MCV: 102.2 fL — ABNORMAL HIGH (ref 80.0–100.0)
Monocytes Absolute: 0.5 10*3/uL (ref 0.1–1.0)
Monocytes Relative: 6 %
Neutro Abs: 7 10*3/uL (ref 1.7–7.7)
Neutrophils Relative %: 77 %
Platelets: 133 10*3/uL — ABNORMAL LOW (ref 150–400)
RBC: 3.17 MIL/uL — ABNORMAL LOW (ref 3.87–5.11)
RDW: 14 % (ref 11.5–15.5)
WBC: 9.1 10*3/uL (ref 4.0–10.5)
nRBC: 0 % (ref 0.0–0.2)

## 2020-05-12 LAB — GLUCOSE, CAPILLARY
Glucose-Capillary: 104 mg/dL — ABNORMAL HIGH (ref 70–99)
Glucose-Capillary: 131 mg/dL — ABNORMAL HIGH (ref 70–99)
Glucose-Capillary: 154 mg/dL — ABNORMAL HIGH (ref 70–99)

## 2020-05-12 LAB — COMPREHENSIVE METABOLIC PANEL
ALT: 49 U/L — ABNORMAL HIGH (ref 0–44)
AST: 18 U/L (ref 15–41)
Albumin: 2.6 g/dL — ABNORMAL LOW (ref 3.5–5.0)
Alkaline Phosphatase: 69 U/L (ref 38–126)
Anion gap: 11 (ref 5–15)
BUN: 23 mg/dL — ABNORMAL HIGH (ref 6–20)
CO2: 23 mmol/L (ref 22–32)
Calcium: 8.3 mg/dL — ABNORMAL LOW (ref 8.9–10.3)
Chloride: 107 mmol/L (ref 98–111)
Creatinine, Ser: 0.69 mg/dL (ref 0.44–1.00)
GFR calc Af Amer: >60 mL/min (ref >60)
GFR calc non Af Amer: >60 mL/min (ref >60)
Glucose, Bld: 111 mg/dL — ABNORMAL HIGH (ref 70–99)
Potassium: 3.6 mmol/L (ref 3.5–5.1)
Sodium: 141 mmol/L (ref 135–145)
Total Bilirubin: 1 mg/dL (ref 0.3–1.2)
Total Protein: 5.9 g/dL — ABNORMAL LOW (ref 6.5–8.1)

## 2020-05-12 NOTE — Progress Notes (Signed)
Occupational Therapy Treatment Patient Details Name: [name] MRN: [id number] DOB: December 07, 1965 Today's Date: 05/12/2020    History of present illness 54 yo female tested positive for COVID 19 on 8/09.  Had progressive symptoms and admitted to [facility] on 8/17.  Treated with remdesivir, prednisone and ABx for CAP.  She developed worsening hypoxia and required intubation 04/21/20 AND  was transferred TO [facility]  with ARDS. Extubated 05/04/20.  JFH:[id number]'[id number] colitis, Carpal tunnel, Osteoarthritis, DM type 2, Asthma, HTN, HLD, OSA, Morbid obesity with BMI 49.64, Depression   OT comments  Treatment focused on improving patient's ability to perform bed mobility, perform sit to stand and use of upper extremities as needed for self care tasks. Patient mod x 2 to transfer to side of bed today, mod x 2 to stand with RW and use of stedy to transfer to recliner. Patient able to lift arms slightly higher today - demonstrating ability to bring right arm to stedy bar slowly and with effort and min active assist for left. Continues to have difficulty supporting her upper body weight with arms on walker and on stedy leaning on forearms. Patient able to hold a cup with two hands to drink through straw and hold spoon with built up handle but unable to bring too mouth in upright seated position. Patient adamant about going home at discharge though therapist still continues to recommend CIR as the best option. See below for recommended DME if she goes home.    Follow Up Recommendations  CIR    Equipment Recommendations   [facility] bed, tub bench, [facility] services.)    Recommendations for Other Services      Precautions / Restrictions Precautions Precautions: Fall Restrictions Weight Bearing Restrictions: No       Mobility Bed Mobility Overal bed mobility: Needs Assistance Bed Mobility: Supine to Sit     Supine to sit: Mod assist;+2 for physical assistance;+2 for safety/equipment     General bed  mobility comments: Mod x 2 today for transfer to side of bed - assistance for LEs, trunk lift off, and pivot to side of bed.  Transfers Overall transfer level: Needs assistance Equipment used: Rolling walker (2 wheeled) Transfers: Sit to/from Stand Sit to Stand: Mod assist;+2 physical assistance;+2 safety/equipment         General transfer comment: Today patient able to stand x 2 with walker from bed ehight with mod assist x 2. Unable to take step or maintain erect upper body posture with RW. Used stedy for 3 rd and 4th sit to stand and transfer to recliner.    Balance Overall balance assessment: Needs assistance Sitting-balance support: No upper extremity supported;Feet supported Sitting balance-Leahy Scale: Good Sitting balance - Comments: Patient able to sit edge of bed without hand hold. Patient able to tolerate anterior and lateral nedges from therapist without loss of balance.   Standing balance support: During functional activity;Bilateral upper extremity supported Standing balance-Leahy Scale: Poor Standing balance comment: reliant on upper extremity and external support                           ADL either performed or assessed with clinical judgement   ADL Overall ADL's : Needs assistance/impaired Eating/Feeding: Set up;Sitting Eating/Feeding Details (indicate cue type and reason): Able to grasp spoon with built up handle but difficulty reaching mouth. Pillow placed under right dominant arm to support elbow. Patient able to hold styrofoam cup with two hands to bring straw clothe  to mouth for drinking.                                         Vision Patient Visual Report: No change from baseline     Perception     Praxis      Cognition Arousal/Alertness: Awake/alert Behavior During Therapy: WFL for tasks assessed/performed Overall Cognitive Status: Within Functional Limits for tasks assessed                                           Exercises     Shoulder Instructions       General Comments      Pertinent Vitals/ Pain       Pain Assessment: No/denies pain  Home Living                                          Prior Functioning/Environment              Frequency  Min 2X/week        Progress Toward Goals  OT Goals(current goals can now be found in the care plan section)  Progress towards OT goals: Progressing toward goals  Acute Rehab OT Goals Patient Stated Goal: To be independent OT Goal Formulation: With patient Time For Goal Achievement: 04/16/20 Potential to Achieve Goals: Good  Plan Discharge plan remains appropriate    Co-evaluation          OT goals addressed during session: ADL's and self-care;Strengthening/ROM (functional mobility)      AM-PAC OT "6 Clicks" Daily Activity     Outcome Measure   Help from another person eating meals?: A Lot Help from another person taking care of personal grooming?: A Lot Help from another person toileting, which includes using toliet, bedpan, or urinal?: Total Help from another person bathing (including washing, rinsing, drying)?: A Lot Help from another person to put on and taking off regular upper body clothing?: Total Help from another person to put on and taking off regular lower body clothing?: Total 6 Click Score: 9    End of Session Equipment Utilized During Treatment: Rolling walker;Other (comment) (stedy)  OT Visit Diagnosis: Muscle weakness (generalized) (M62.81);Other abnormalities of gait and mobility (R26.89);Other symptoms and signs involving cognitive function   Activity Tolerance Patient tolerated treatment well   Patient Left in chair;with call bell/phone within reach;with chair alarm set;with family/visitor present   Nurse Communication Mobility status        Time: 2353-6144 OT Time Calculation (min): 29 min  Charges: OT General Charges $OT Visit: 1 Visit OT  Treatments $Therapeutic Activity: 23-37 mins  Anvitha Hutmacher, OTR/L [place]  Office [phone] Pager: [place] 05/12/2020, 1:22 PM

## 2020-05-12 NOTE — Progress Notes (Signed)
PROGRESS NOTE    [name]  CXK:[id number] DOB: 03/29/1966 DOA: 04/20/2020 PCP: Caryl Bis, MD  Brief Narrative:   54 year old community dwelling white female severe obesity BMI >47 DM TY two HTN right hip repair 2013, carpal tunnel release 09/2019 with other multiple surgeries 10/2019 Crohn's colitis since 1998 followed by Dr. Melony Overly on Pentasa currently, irritable bowel syndrome  Tested positive Covid 04/08/2020 admitted [facility] 04/16/2020-intubated for VDRF ARDS-long hospital course until extubation 05/04/2020--completed remdesivir, steroids, cefepime  Not given Actemra secondary to Crohn's   Transferred to [facility] 05/06/2020 on 8 L nasal cannula Has core track in place but speech therapy felt she was at high risk for swallowing given dysphagia and prolonged intubation Rectal tube discontinued 9/10 for likely feed associated diarrhea but this has subsequently resolved to some degree with Loperamide  Requested to leave AMA because of PEG feeds 9/10 but fortunately elected to stay Has been refused for CIR and is insistent on going home instead of nursing facility so [facility] will be talking to her on Monday    Assessment & Plan:   Principal Problem:   ARDS (adult respiratory distress syndrome) ([place]) Active Problems:   Hypertension complicating diabetes ([place])   Crohn disease ([place])   IBS (irritable bowel syndrome)   Physical deconditioning   Dysphagia   Pressure injury of sacral region, stage 1   1. COVID-19 with ARDS acute respiratory (VDRF 8/17 through 9/5) failure now recovery a. Status post goal-directed therapies for Covid given other than baricitinib b. Tapering prednisone to 20 mg and will continue this as this is her home dose c. Received vancomycin/cefepime antibiotics finishing 9/8 d. Continues to recover slowly-is on room air 2. Severe dysphagia n.p.o. at present a. Appreciate speech input, currently dysphagia 2- b. If coughs or vomits replacement of  NG-she clearly understands and her husband verbalized understanding in addition c. Periodic check CXR if needed 3. Severe debilitation and possible critical illness/steroid-induced myopathy a. Severely weak right upper extremity but also quite weak on the left b. She will need to continue unfortunately her steroids for her Crohn's c. She is determined to go home but has good support in the outpatient setting with multiple family members 4. Tube feed associated diarrhea a. Not infectious-only 1 stool overnight-continue Loperamide 2 mg every 8 5. AKI with hypernatremia a. Tube feeds/free water discontinued 9/12 b. AKI slowly getting better c. and continue D5 NS 50 cc/h right now 6. 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 Continue linagliptin 5 mg, Levemir 15 in addition to sliding scale b. Continue fluids as above c. CBG well controlled 104-93 7. Crohn's disease + IBS followed by Dr. Melony Overly previously on Pentasa a. Continue Pentasa 1000 4 times daily and outpatient follow-up with him b. Continue prednisone 20 8. HTN 9. Sinus tachycardia a. Continue metoprolol 50 every 8,-switched her Cardizem liquid to Tiazac 360 usual home dose b. Sinus rhythm on exam today   DVT prophylaxis: Lovenox Code Status: Full  family Communication: No family updated fully patient at bedside 9/12  Disposition: Inpatient  Status is: Inpatient  Remains inpatient appropriate because:Hemodynamically unstable, Ongoing active pain requiring inpatient pain management, Altered mental status and Unsafe d/c plan   Dispo: The patient is from: Home              Anticipated d/c is to: Home              Anticipated d/c date is: 2 days  Patient currently is not medically stable to d/c.       Consultants:   Multiple  Procedures: Multiple  Antimicrobials: None currently   Subjective:  Pleasant awake alert ate mashed potatoes ice cream in addition to chicken yesterday which was all chopped up Quite  weak on the right side she expresses some concern about this No chest pain Had one loose stool last night but no fever no chills No nausea no vomiting  Objective: Vitals:   05/11/20 0700 05/11/20 1508 05/11/20 2058 05/12/20 0534  BP: (!) 166/79 (!) 159/65 (!) 142/64 (!) 142/67  Pulse: 100 95 76 80  Resp: 20 (!) 24 20 18   Temp: 98.7 F (37.1 C) 99.8 F (37.7 C) 98.8 F (37.1 C) 98.6 F (37 C)  TempSrc: Oral Oral Oral Oral  SpO2: 92% 95% 92% 91%  Weight:      Height:        Intake/Output Summary (Last 24 hours) at 05/12/2020 0914 Last data filed at 05/12/2020 0600 Gross per 24 hour  Intake 301.67 ml  Output 1050 ml  Net -748.33 ml   Filed Weights   05/07/20 0500 05/08/20 0500 05/09/20 0500  Weight: 122.6 kg 121.3 kg 121.3 kg    Examination:  Thick neck EOMI NCAT Pleasant no icterus no pallor Chest clear S1-S2 no murmur ROM right upper extremity is within normal limits but quite weak and grade 3/5 power cannot resist gravity left side is better Neer test Hawkins test as well as internal/external rotation or shoulder is painless she has no point tenderness in the bicipital groove Straight leg raise not tested She is slightly edematous   Data Reviewed: I have personally reviewed following labs and imaging studies BUNs/creatinine trending 33/0.7-->23/0.6 (baseline 20/less than 0.3) White count 13.7-->9.6-->9.1 Hemoglobin 10.5-->10.7 Platelet 133   Radiology Studies: No results found.   Scheduled Meds: . chlorhexidine  15 mL Mouth Rinse BID  . Chlorhexidine Gluconate Cloth  6 each Topical Daily  . cholecalciferol  1,000 Units Per Tube Daily  . diltiazem  360 mg Oral Daily  . DULoxetine  60 mg Oral Daily  . enoxaparin (LOVENOX) injection  60 mg Subcutaneous Q24H  . insulin aspart  0-15 Units Subcutaneous Q6H  . insulin detemir  15 Units Subcutaneous QHS  . linagliptin  5 mg Per Tube Daily  . mouth rinse  15 mL Mouth Rinse q12n4p  . mesalamine  1,000 mg Oral  QID  . metoprolol tartrate  50 mg Per Tube Q8H  . multivitamin  15 mL Per Tube Daily  . pneumococcal 23 valent vaccine  0.5 mL Intramuscular Tomorrow-1000  . predniSONE  20 mg Per NG tube Daily  . sodium chloride flush  3 mL Intravenous Q12H   Continuous Infusions:     LOS: 22 days    Time spent: [place], MD [facility] To contact the attending provider between 7A-7P or the covering provider during after hours 7P-7A, please log into the web site [email or web address] and access using universal [place] password for that web site. If you do not have the password, please call the hospital operator.  05/12/2020, 9:14 AM

## 2020-05-13 LAB — GLUCOSE, CAPILLARY
Glucose-Capillary: 105 mg/dL — ABNORMAL HIGH (ref 70–99)
Glucose-Capillary: 105 mg/dL — ABNORMAL HIGH (ref 70–99)
Glucose-Capillary: 131 mg/dL — ABNORMAL HIGH (ref 70–99)
Glucose-Capillary: 152 mg/dL — ABNORMAL HIGH (ref 70–99)
Glucose-Capillary: 93 mg/dL (ref 70–99)

## 2020-05-13 LAB — COMPREHENSIVE METABOLIC PANEL
ALT: 47 U/L — ABNORMAL HIGH (ref 0–44)
AST: 22 U/L (ref 15–41)
Albumin: 2.5 g/dL — ABNORMAL LOW (ref 3.5–5.0)
Alkaline Phosphatase: 92 U/L (ref 38–126)
Anion gap: 9 (ref 5–15)
BUN: 23 mg/dL — ABNORMAL HIGH (ref 6–20)
CO2: 25 mmol/L (ref 22–32)
Calcium: 8.3 mg/dL — ABNORMAL LOW (ref 8.9–10.3)
Chloride: 107 mmol/L (ref 98–111)
Creatinine, Ser: 0.81 mg/dL (ref 0.44–1.00)
GFR calc Af Amer: >60 mL/min (ref >60)
GFR calc non Af Amer: >60 mL/min (ref >60)
Glucose, Bld: 99 mg/dL (ref 70–99)
Potassium: 3.6 mmol/L (ref 3.5–5.1)
Sodium: 141 mmol/L (ref 135–145)
Total Bilirubin: 0.8 mg/dL (ref 0.3–1.2)
Total Protein: 5.6 g/dL — ABNORMAL LOW (ref 6.5–8.1)

## 2020-05-13 LAB — CBC WITH DIFFERENTIAL/PLATELET
Abs Immature Granulocytes: 0.09 10*3/uL — ABNORMAL HIGH (ref 0.00–0.07)
Basophils Absolute: 0 10*3/uL (ref 0.0–0.1)
Basophils Relative: 1 %
Eosinophils Absolute: 0.1 10*3/uL (ref 0.0–0.5)
Eosinophils Relative: 1 %
HCT: 31.2 % — ABNORMAL LOW (ref 36.0–46.0)
Hemoglobin: 10.1 g/dL — ABNORMAL LOW (ref 12.0–15.0)
Immature Granulocytes: 2 %
Lymphocytes Relative: 22 %
Lymphs Abs: 1.3 10*3/uL (ref 0.7–4.0)
MCH: 33.2 pg (ref 26.0–34.0)
MCHC: 32.4 g/dL (ref 30.0–36.0)
MCV: 102.6 fL — ABNORMAL HIGH (ref 80.0–100.0)
Monocytes Absolute: 0.4 10*3/uL (ref 0.1–1.0)
Monocytes Relative: 7 %
Neutro Abs: 4.1 10*3/uL (ref 1.7–7.7)
Neutrophils Relative %: 67 %
Platelets: 138 10*3/uL — ABNORMAL LOW (ref 150–400)
RBC: 3.04 MIL/uL — ABNORMAL LOW (ref 3.87–5.11)
RDW: 14.1 % (ref 11.5–15.5)
WBC: 6.1 10*3/uL (ref 4.0–10.5)
nRBC: 0 % (ref 0.0–0.2)

## 2020-05-13 MED ORDER — ADULT MULTIVITAMIN W/MINERALS CH
1.0000 | ORAL_TABLET | Freq: Every day | ORAL | Status: DC
  Administered 2020-05-13: 1 via ORAL

## 2020-05-13 MED ORDER — CALCIUM CARBONATE-VITAMIN D 500-200 MG-UNIT PO TABS
1.0000 | ORAL_TABLET | Freq: Two times a day (BID) | ORAL | Status: DC
  Administered 2020-05-13 – 2020-05-16 (×6): 1 via ORAL
  Filled 2020-05-13 (×7): qty 1

## 2020-05-13 MED ORDER — CALCIUM CITRATE-VITAMIN D 500-500 MG-UNIT PO CHEW
1.0000 | CHEWABLE_TABLET | Freq: Two times a day (BID) | ORAL | Status: DC
  Filled 2020-05-13: qty 1

## 2020-05-13 NOTE — Progress Notes (Signed)
PROGRESS NOTE    [name]  PZW:[id number] DOB: 1966-03-04 DOA: 04/20/2020 PCP: Caryl Bis, MD  Brief Narrative:   54 year old community dwelling white female severe obesity BMI >47 DM TY two HTN right hip repair 2013, carpal tunnel release 09/2019 with other multiple surgeries 10/2019 Crohn's colitis since 1998 followed by Dr. Melony Overly on Pentasa currently, irritable bowel syndrome  Tested positive Covid 04/08/2020 admitted [facility] 04/16/2020-intubated for VDRF ARDS-long hospital course until extubation 05/04/2020--completed remdesivir, steroids, cefepime  Not given Actemra secondary to Crohn's   Transferred to [facility] 05/06/2020 on 8 L nasal cannula Has core track in place but speech therapy felt she was at high risk for swallowing given dysphagia and prolonged intubation Rectal tube discontinued 9/10 for likely feed associated diarrhea but this has subsequently resolved to some degree with Loperamide  Requested to leave AMA because of PEG feeds 9/10 but fortunately elected to stay Been working with therapy diligently-CIR reconsulted given possible progression and more feasibility for stay    Assessment & Plan:   Principal Problem:   ARDS (adult respiratory distress syndrome) ([place]) Active Problems:   Hypertension complicating diabetes ([place])   Crohn disease ([place])   IBS (irritable bowel syndrome)   Physical deconditioning   Dysphagia   Pressure injury of sacral region, stage 1   1. COVID-19 with ARDS acute respiratory (VDRF 8/17 through 9/5) failure now recovery and on room air a. Status post goal-directed therapies for Covid given other than baricitinib b. Tapering prednisone to 20 mg which is her home dose c. Received vancomycin/cefepime antibiotics finishing 9/8 2. Severe dysphagia n.p.o. at present a. Appreciate speech input, currently dysphagia 2-MBS unfortunately cannot be done yet-defer to speech planning and graduation of diet b. Periodic check CXR  if needed 3. Severe debilitation and possible critical illness/steroid-induced myopathy-unsure if she has bilateral foot drop? a. Right upper extremity weakness improved and is feeling stronger b. Does have lower extremity  myopathy, cannot dorsiflex c. Therapy aware this is not to them foot drop but they will work with her again 4. Tube feed associated diarrhea a. Resolved continuing Loperamide 2 mg every 8 5. AKI with hypernatremia a. Tube feeds/free water discontinued 9/12 b. AKI slowly getting better c. Continuing D5 NS 50 cc/h right now 6. 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 Continue linagliptin 5 mg, Levemir 15 + sliding scale-CBGs appropriate 93 152 b. Continue fluids as above 7. Crohn's disease + IBS followed by Dr. Melony Overly previously on Pentasa a. Continue Pentasa 1000 4 times daily and outpatient follow-up with him b. Continue prednisone 20 8. HTN 9. Sinus tachycardia a. Continue metoprolol 50 every 8,-switched her Cardizem liquid to Tiazac 360 usual home dose b. Sinus rhythm on exam today   DVT prophylaxis: Lovenox Code Status: Full  family Communication: No family present today  Disposition: Inpatient  Status is: Inpatient  Remains inpatient appropriate because:Hemodynamically unstable, Ongoing active pain requiring inpatient pain management, Altered mental status and Unsafe d/c plan   Dispo: The patient is from: Home              Anticipated d/c is to: Home              Anticipated d/c date is: 2 days              Patient currently is not medically stable to d/c.       Consultants:   Multiple  Procedures: Multiple  Antimicrobials: None currently   Subjective:  Tolerating some  diet Asking for pill form of supplements and graduation of diet MBS not able to be performed today as the equipment is now working She has no chest pain no cough no cold no fever and is on room air We had a long discussion about getting therapy with rehab and I went over the  benefits of the same-she is willing to consider this and I spoke to case management about this  Objective: Vitals:   05/13/20 0254 05/13/20 0257 05/13/20 0643 05/13/20 1043  BP: 133/66 133/66 134/64 134/64  Pulse: 77 77 82   Resp:   18   Temp:   99.1 F (37.3 C)   TempSrc:   Oral   SpO2:   96%   Weight:   (!) 162.4 kg   Height:        Intake/Output Summary (Last 24 hours) at 05/13/2020 1428 Last data filed at 05/13/2020 1044 Gross per 24 hour  Intake 3 ml  Output --  Net 3 ml   Filed Weights   05/08/20 0500 05/09/20 0500 05/13/20 0643  Weight: 121.3 kg 121.3 kg (!) 162.4 kg    Examination:  Pleasant coherent no distress EOMI NCAT S1-S2 no murmur RRR Chest clear no added sound no rales no rhonchi Abdomen obese nontender no rebound no guarding ?  Foot drop bilaterally able to plantarflex but not dorsiflex No lower extremity edema   Data Reviewed: I have personally reviewed following labs and imaging studies BUNs/creatinine 23/0.8 LFTs not deranged Hemoglobin 10.1 platelet 138   Radiology Studies: No results found.   Scheduled Meds: . calcium citrate-vitamin D  1 tablet Oral BID  . chlorhexidine  15 mL Mouth Rinse BID  . Chlorhexidine Gluconate Cloth  6 each Topical Daily  . diltiazem  360 mg Oral Daily  . DULoxetine  60 mg Oral Daily  . enoxaparin (LOVENOX) injection  60 mg Subcutaneous Q24H  . insulin aspart  0-15 Units Subcutaneous Q6H  . insulin detemir  15 Units Subcutaneous QHS  . linagliptin  5 mg Per Tube Daily  . mouth rinse  15 mL Mouth Rinse q12n4p  . mesalamine  1,000 mg Oral QID  . metoprolol tartrate  50 mg Per Tube Q8H  . pneumococcal 23 valent vaccine  0.5 mL Intramuscular Tomorrow-1000  . predniSONE  20 mg Per NG tube Daily  . sodium chloride flush  3 mL Intravenous Q12H   Continuous Infusions:     LOS: 23 days   Time spent: Florin, MD [facility] To contact the attending provider between 7A-7P or the  covering provider during after hours 7P-7A, please log into the web site [email or web address] and access using universal [place] password for that web site. If you do not have the password, please call the hospital operator.  05/13/2020, 2:28 PM

## 2020-05-13 NOTE — Progress Notes (Addendum)
Inpatient Rehabilitation Admissions Coordinator  Asked by [facility] to reassess patient for possible [facility] admit. I reviewed case with Naaman Plummer and he reviewed her therapy progress. She will need to demonstrate improved activity tolerance before we can consider her for possible admit. I have alerted TOC, Nora and Dr. Verlon Au. I will follow from a distance as she remains in house.  Danne Baxter, RN, MSN Rehab Admissions Coordinator [phone] 05/13/2020 3:39 PM

## 2020-05-13 NOTE — Progress Notes (Signed)
  Speech Language Pathology Treatment: Dysphagia  Patient Details Name: [name] MRN: [id number] DOB: 09-05-1965 Today's Date: 05/13/2020 Time: 3716-9678 SLP Time Calculation (min) (ACUTE ONLY): 50 min  Assessment / Plan / Recommendation Clinical Impression  Separate session to initiate RMST (Respiratory Muscle Strength Training) to strengthen pt's vocal cord adduction, swallow and cough mechanics.  [employer] were set clinically for patient using trials - PEP (Positive Expiratory Pressure) was set at 11 cm H20 pressure and IMT (Inspiratory Muscle Trainer) was set at 9 cm H20 pressure.  Pt initially benefited from moderate verbal/visual cues to perform adequately , fading to mod I by end of the session.  She is motivated and states she will do "whatever is needed to get to rehab".  Using teach back, instructions for use, contraindications were reviewed in detail with pt verbalizing effort goal 7/10 overall.  Pt performed 10 repetitions of each trainer and is instructed to conduct this BEFORE eating 3 times/day (due to risk of refluxing, etc).  Pt states her voice is markedly improved since last week but admits it will weaken as the day progresses, causing SLP to suspect some muscle weakness contributing (? diaphgram and vocal fold adductors). Advised her that if her voice continues to be weak after her rehab stay *approx 22 day per pt* - an ENT referral would be advised to allow viewing of vocal cords to determine if intervention is warranted. Pt agreeable to plan.  Will follow up next date to assure pt's RMST trainers at set to optimize her swallow/voice recovery.    HPI HPI: 54 yo female adm to [facility] on 8/21 from [place] after admitted there on 8/17 - after having COVID with respiratory failure.  Pt treated with remedsivir, prednisone, ABX for CAP.  Pt found to have ARDS, pneumonia, VDRF- intubated 8/21-9/4.  Pt was started on a diet over the weekend of dys2/thin with small  bore feeding tube removed.  She has continued to make progress with stronger voice, improved swallowing/strength.  Today pt seen to assess po tolerance, determine readiness for dietary advancement and indication for instrumental evaluation *flouro machine remains down.      SLP Plan  Continue with current plan of care (defer MBS due to pt's clinical progress)       Recommendations  Diet recommendations: Dysphagia 3 (mechanical soft);Thin liquid Liquids provided via: Straw (given small, single sips) Medication Administration: Other (Comment) (with pudding) Supervision: Trained caregiver to feed patient;Staff to assist with self feeding Compensations: Slow rate;Small sips/bites Postural Changes and/or Swallow Maneuvers: Seated upright 90 degrees;Upright 30-60 min after meal                Oral Care Recommendations: Oral care QID;Oral care before and after PO Follow up Recommendations: Skilled Nursing facility SLP Visit Diagnosis: Dysphagia, oropharyngeal phase (R13.12) Plan: Continue with current plan of care (defer MBS due to pt's clinical progress)       GO                Macario Golds 05/13/2020, 5:28 PM   Kathleen Lime, MS [place] Office [phone]

## 2020-05-13 NOTE — Progress Notes (Signed)
Physical Therapy Treatment Patient Details Name: [name] MRN: [id number] DOB: [birth year] Today's Date: 05/13/2020    History of Present Illness 54 yo female tested positive for COVID 19 on 8/09.  Had progressive symptoms and admitted to [facility] on 8/17.  Treated with remdesivir, prednisone and ABx for CAP.  She developed worsening hypoxia and required intubation 04/21/20 AND  was transferred TO [facility]  with ARDS. Extubated 05/04/20.  NAT:FTDDU'K colitis, Carpal tunnel, Osteoarthritis, DM type 2, Asthma, HTN, HLD, OSA, Morbid obesity with BMI 49.64, Depression    PT Comments    Pt looks less edematous esp B UE's.   Unable to Tx pt in [employer] bed so used MAXI MOVE to transfer to [employer]. General bed mobility comments: Max Assist + 2 to roll side to side to side for hygiene, pad replacement and MAXI Move pad placeemnt.  Pt self able < 5%.  General transfer comment: used Maxi Move to transfer pt OOB to recliner then from recliner level assisted with several partial sit to stand activities.  Pt required MAX Assist "Bear Hug" to achieve partial upright stance with a very limited duration of 3 seconds.  Performed 5 eps x 2 to facilitate B LE WBing and muscle facilitation.  Pt unable to use B UE's to push off due to weakness and edema.  Pt unable to weight shift posterior to anterior due to ABD girth.  Pt was able to static sit edge of recliner > 10 min while performing B UE and B LE TE's.  Also performed some dynamic trunk activities of lateral R/L weight shift and posterior/anteruior motion.  Each activity required at least a 30 sec rest break. Profound weakness.  Would benefit from aggressive Rehab as well as Rehab equipment such as a  mat table and parallel Bars.    Follow Up Recommendations  CIR     Equipment Recommendations       Recommendations for Other Services       Precautions / Restrictions Precautions Precautions: Fall Precaution Comments: post VDRF, SIRS, B foot drop,  prolomged hospital stay    Mobility  Bed Mobility Overal bed mobility: Needs Assistance   Rolling: +2 for safety/equipment;+2 for physical assistance;Max assist         General bed mobility comments: Max Assist + 2 to roll side to side to side for hygiene, pad replacement and MAXI Move pad placeemnt.  Pt self able < 5%  Transfers Overall transfer level: Needs assistance Equipment used: None Transfers: Sit to/from Stand Sit to Stand: Total assist         General transfer comment: used Maxi Move to transfer pt OOB to recliner then from recliner level assisted with several partial sit to stand activities.  Pt required MAX Assist "Bear Hug" to achieve partial upright stance with a very limited duration of 3 seconds.  Pt unable to use B UE's to push off due to weakness and edema.  Pt unable to weight shift posterior to anterior due to ABD girth.  Pt was able to static sit edge of recliner > 10 min while performing B UE and B LE TE's.  Also performed some dynamic trunk activities of lateral R/L weight shift and posterior/anteruior motion.  Each activity required at least a 30 sec rest break.  Ambulation/Gait             General Gait Details: non amb since admitted to [facility] ED Aug 9th   Stairs  Wheelchair Mobility    Modified Rankin (Stroke Patients Only)       Balance                                            Cognition Arousal/Alertness: Awake/alert Behavior During Therapy: WFL for tasks assessed/performed                                   General Comments: AxO x 3 very pleasant, aware of her current medical condition and motivated to "get better" but absolutely declining SNF Rehab      Exercises      General Comments        Pertinent Vitals/Pain Pain Assessment: No/denies pain    Home Living                      Prior Function            PT Goals (current goals can now be found in the  care plan section) Progress towards PT goals: Progressing toward goals    Frequency    Min 3X/week      PT Plan Current plan remains appropriate    Co-evaluation              AM-PAC PT "6 Clicks" Mobility   Outcome Measure  Help needed turning from your back to your side while in a flat bed without using bedrails?: A Lot Help needed moving from lying on your back to sitting on the side of a flat bed without using bedrails?: A Lot Help needed moving to and from a bed to a chair (including a wheelchair)?: Total Help needed standing up from a chair using your arms (e.g., wheelchair or bedside chair)?: Total Help needed to walk in hospital room?: Total Help needed climbing 3-5 steps with a railing? : Total 6 Click Score: 8    End of Session   Activity Tolerance: Patient tolerated treatment well Patient left: in chair;with call bell/phone within reach Nurse Communication: Mobility status;Need for lift equipment (MAXI MOVE back to bed) PT Visit Diagnosis: Unsteadiness on feet (R26.81);Muscle weakness (generalized) (M62.81);Other symptoms and signs involving the nervous system (R29.898);Difficulty in walking, not elsewhere classified (R26.2)     Time: 7711-6579 PT Time Calculation (min) (ACUTE ONLY): 53 min  Charges:  $Therapeutic Exercise: 23-37 mins $Therapeutic Activity: 23-37 mins                     {Wm Fruchter  PTA Acute  Rehabilitation Services Pager      [phone] Office      [phone]

## 2020-05-13 NOTE — Progress Notes (Signed)
Nutrition Follow-up  DOCUMENTATION CODES:   Morbid obesity  INTERVENTION:   -Recommend Ensure Enlive po BID, each supplement provides 350 kcal and 20 grams of protein -Multivitamin with minerals daily -Magic cup TID with meals, each supplement provides 290 kcal and 9 grams of protein  NUTRITION DIAGNOSIS:   Increased nutrient needs related to acute illness, catabolic illness (TIWPY-09 infection) as evidenced by estimated needs.  Ongoing.  GOAL:   Patient will meet greater than or equal to 90% of their needs  Not meeting.  MONITOR:   Diet advancement, TF tolerance, Labs, Weight trends  ASSESSMENT:   54 year-old female with medical history of Crohn's disease/IBS, obesty, asthma, arthritis, sleep apnea, HLD, DM, and HTN. She was transferred to [facility] from [facility] d/t COVID-19 PNA. She is not COVID vaccinated.  Significant Events: 8/22-transfer from [facility] to [facility]; was intubated and R nare NGT placed prior to transfer; TF initiation  8/23-initial RD assessment; vomiting x1 in the AM 8/24-begin cycles of 16 hours proned, 8 hours supine; small bore feeding tube placed; TF re-started at trickle rate 8/25- proning 8/31- SMOG enema 9/4- extubation 9/12- TF stopped  Per chart review, pt wanted tube feeds stopped over the weekend d/t increase in diarrhea.   Per SLP note on 9/11, pt started on dysphagia 2 diet with concern for aspiration. MBS was planned for today.   Pt would likely benefit from nutritional supplements given deconditioning but will monitor for results of MBS prior to ordering.  Admission weight: 280 lbs. Current weight: 358 lbs -will continue to monitor trends as this is the highest weight this admission. Possible error? Per nursing documentation, mild-moderate edema on 9/12.  Medications: Vitamin D, Liquid MVI Labs reviewed: CBGs: 93-152  Diet Order:   Diet Order            DIET DYS 2 Room service appropriate? Yes; Fluid consistency:  Thin  Diet effective now                 EDUCATION NEEDS:   No education needs have been identified at this time  Skin:  Skin Assessment: Skin Integrity Issues: Skin Integrity Issues:: Other (Comment) Other: Skin tears x2 to R thigh (9/3)  Last BM:  9/12  Height:   Ht Readings from Last 1 Encounters:  04/29/20 5' 3"  (1.6 m)    Weight:   Wt Readings from Last 1 Encounters:  05/13/20 (!) 162.4 kg   BMI:  Body mass index is 63.42 kg/m.  Estimated Nutritional Needs:   Kcal:  2400-2600 kcal (30-32.5 kcal/kg adj BW)  Protein:  120-130 grams  Fluid:  >/= 2 L/day  Clayton Bibles, MS, RD, LDN Inpatient Clinical Dietitian Contact information available via Amion

## 2020-05-13 NOTE — Progress Notes (Signed)
  Speech Language Pathology Treatment: Dysphagia  Patient Details Name: [name] MRN: [id number] DOB: 06/13/1966 Today's Date: 05/13/2020 Time: 0076-2263 SLP Time Calculation (min) (ACUTE ONLY): 40 min  Assessment / Plan / Recommendation Clinical Impression  Today pt consumed approximately 2 ounces of tea, 2 ounces of gingerale, 3 bites of cookie and six bites of mashed potatoes.  She masticates well and only cleared her throat x2 during entire session - x1 before intake and x1 after starting liquid consumption.  No coughing, indication of aspiration or dyspnea with intake observed!  Pt is reporting she is becoming full quickly and intake is subpar.   SlP encouraged her to consume nutritionally dense foods.  At this time, recommend to advance pt to dys3/thin diet to help with energy conservation as pt admits increased in fatigue,vocal weakness, etc in the afternoon compared to morning.    At this time, recommend defer MBS as pt has advanced clinically adequately.     Pt independently stated need to brush her teeth prior to po intake without cue - to which SLP assisted pt to conduct. Will follow up for po tolerance given advancement. Pt agreeable to plan to try advanced diet.    HPI HPI: 54 yo female adm to [facility] on 8/21 from [place] after admitted there on 8/17 - after having COVID with respiratory failure.  Pt treated with remedsivir, prednisone, ABX for CAP.  Pt found to have ARDS, pneumonia, VDRF- intubated 8/21-9/4.  She has a small bore feeding tube in place. Pt is anxious  to eat and drink and have coretrak removed.      SLP Plan  Continue with current plan of care (defer MBS due to pt's clinical progress)       Recommendations  Diet recommendations: Dysphagia 3 (mechanical soft);Thin liquid Liquids provided via: Straw (given small, single sips) Medication Administration: Other (Comment) (with pudding) Supervision: Trained caregiver to feed patient;Staff to assist with  self feeding Compensations: Slow rate;Small sips/bites Postural Changes and/or Swallow Maneuvers: Seated upright 90 degrees;Upright 30-60 min after meal                Oral Care Recommendations: Oral care QID;Oral care before and after PO Follow up Recommendations: Skilled Nursing facility SLP Visit Diagnosis: Dysphagia, oropharyngeal phase (R13.12) Plan: Continue with current plan of care (defer MBS due to pt's clinical progress)       GO                [name] 05/13/2020, 5:08 PM Cheryl Lime, MS [place] Office [phone]

## 2020-05-14 LAB — COMPREHENSIVE METABOLIC PANEL
ALT: 86 U/L — ABNORMAL HIGH (ref 0–44)
AST: 57 U/L — ABNORMAL HIGH (ref 15–41)
Albumin: 2.5 g/dL — ABNORMAL LOW (ref 3.5–5.0)
Alkaline Phosphatase: 266 U/L — ABNORMAL HIGH (ref 38–126)
Anion gap: 11 (ref 5–15)
BUN: 25 mg/dL — ABNORMAL HIGH (ref 6–20)
CO2: 24 mmol/L (ref 22–32)
Calcium: 8.6 mg/dL — ABNORMAL LOW (ref 8.9–10.3)
Chloride: 107 mmol/L (ref 98–111)
Creatinine, Ser: 0.96 mg/dL (ref 0.44–1.00)
GFR calc Af Amer: >60 mL/min (ref >60)
GFR calc non Af Amer: >60 mL/min (ref >60)
Glucose, Bld: 112 mg/dL — ABNORMAL HIGH (ref 70–99)
Potassium: 3.7 mmol/L (ref 3.5–5.1)
Sodium: 142 mmol/L (ref 135–145)
Total Bilirubin: 0.9 mg/dL (ref 0.3–1.2)
Total Protein: 5.6 g/dL — ABNORMAL LOW (ref 6.5–8.1)

## 2020-05-14 LAB — CBC WITH DIFFERENTIAL/PLATELET
Abs Immature Granulocytes: 0.09 10*3/uL — ABNORMAL HIGH (ref 0.00–0.07)
Basophils Absolute: 0 10*3/uL (ref 0.0–0.1)
Basophils Relative: 0 %
Eosinophils Absolute: 0 10*3/uL (ref 0.0–0.5)
Eosinophils Relative: 1 %
HCT: 30.4 % — ABNORMAL LOW (ref 36.0–46.0)
Hemoglobin: 10 g/dL — ABNORMAL LOW (ref 12.0–15.0)
Immature Granulocytes: 1 %
Lymphocytes Relative: 15 %
Lymphs Abs: 1 10*3/uL (ref 0.7–4.0)
MCH: 33.7 pg (ref 26.0–34.0)
MCHC: 32.9 g/dL (ref 30.0–36.0)
MCV: 102.4 fL — ABNORMAL HIGH (ref 80.0–100.0)
Monocytes Absolute: 0.5 10*3/uL (ref 0.1–1.0)
Monocytes Relative: 8 %
Neutro Abs: 4.9 10*3/uL (ref 1.7–7.7)
Neutrophils Relative %: 75 %
Platelets: 142 10*3/uL — ABNORMAL LOW (ref 150–400)
RBC: 2.97 MIL/uL — ABNORMAL LOW (ref 3.87–5.11)
RDW: 14.4 % (ref 11.5–15.5)
WBC: 6.5 10*3/uL (ref 4.0–10.5)
nRBC: 0 % (ref 0.0–0.2)

## 2020-05-14 LAB — GLUCOSE, CAPILLARY
Glucose-Capillary: 107 mg/dL — ABNORMAL HIGH (ref 70–99)
Glucose-Capillary: 132 mg/dL — ABNORMAL HIGH (ref 70–99)
Glucose-Capillary: 151 mg/dL — ABNORMAL HIGH (ref 70–99)
Glucose-Capillary: 131 mg/dL — ABNORMAL HIGH (ref 70–99)

## 2020-05-14 MED ORDER — PREDNISONE 20 MG PO TABS: 20.0000 mg | ORAL_TABLET | Freq: Every day | ORAL | Status: DC

## 2020-05-14 MED ORDER — HYDROXYZINE HCL 10 MG PO TABS: 20.0000 mg | ORAL_TABLET | Freq: Three times a day (TID) | ORAL | 0 refills | Status: DC | PRN

## 2020-05-14 MED ORDER — DULOXETINE HCL 60 MG PO CPEP: 60.0000 mg | ORAL_CAPSULE | Freq: Every day | ORAL | 0 refills | Status: AC

## 2020-05-14 MED ORDER — TRAZODONE HCL 50 MG PO TABS: 50.0000 mg | ORAL_TABLET | Freq: Every evening | ORAL | 0 refills | Status: DC | PRN

## 2020-05-14 MED ORDER — METOPROLOL TARTRATE 50 MG PO TABS: 50.0000 mg | ORAL_TABLET | Freq: Three times a day (TID) | ORAL | Status: DC

## 2020-05-14 MED ORDER — ENSURE ENLIVE PO LIQD
237.0000 mL | Freq: Two times a day (BID) | ORAL | Status: DC
  Administered 2020-05-14: 237 mL via ORAL

## 2020-05-14 NOTE — Progress Notes (Signed)
Patient stabilizing for discharge hopeful for discharge a.m. 9/15-see my discharge summary Covid testing will probably come back positive given her recent diagnosis and treatment for the same-TOC aware She is NOT INFECTIOUS for coronavirus 19  Verneita Griffes, MD [facility] 3:40 PM

## 2020-05-14 NOTE — NC FL2 (Signed)
[place] LEVEL OF CARE SCREENING TOOL     IDENTIFICATION  Patient Name: [name] Birthdate: 09/06/1965 Sex: female Admission Date (Current Location): 04/20/2020  [facility] and [place] Number:  Herbalist and Address:  [facility],  [place] [address] [place]      Provider Number: [id number]  Attending Physician Name and Address:  Nita Sells, MD  Relative Name and Phone Number:       Current Level of Care: Hospital Recommended Level of Care: [place] Prior Approval Number:    Date Approved/Denied:   PASRR Number: [id number]  Discharge Plan: SNF    Current Diagnoses: Patient Active Problem List   Diagnosis Date Noted  . Pressure injury of sacral region, stage 1 05/09/2020  . Physical deconditioning 05/07/2020  . Dysphagia 05/07/2020  . ARDS (adult respiratory distress syndrome) ([place]) 04/21/2020  . History of total hip arthroplasty, right 10/12/2019  . Trochanteric bursitis, right hip 10/12/2019  . Trigger thumb, right thumb 04/21/2018  . IBS (irritable bowel syndrome) 06/18/2015  . Obesity 10/08/2014  . Osteoarthritis of right hip 04/20/2012  . Hypertension complicating diabetes ([place]) 10/08/2011  . High cholesterol 10/08/2011  . Diabetes in pregnancy 10/08/2011  . Crohn disease ([place]) 10/08/2011    Orientation RESPIRATION BLADDER Height & Weight     Self, Time, Situation, Place  Normal Continent Weight: (!) 162.4 kg Height:  5' 3"  (160 cm)  BEHAVIORAL SYMPTOMS/MOOD NEUROLOGICAL BOWEL NUTRITION STATUS      Incontinent Diet  AMBULATORY STATUS COMMUNICATION OF NEEDS Skin   Extensive Assist Verbally Normal                       Personal Care Assistance Level of Assistance  Bathing, Dressing, Total care Bathing Assistance: Maximum assistance   Dressing Assistance: Maximum assistance Total Care Assistance: Maximum assistance   Functional Limitations Info              SPECIAL CARE FACTORS FREQUENCY  PT (By licensed PT), OT (By licensed OT)     PT Frequency: 5x weekly OT Frequency: 5x weekly            Contractures Contractures Info: Not present    Additional Factors Info  Code Status, Allergies Code Status Info: Full Allergies Info: remicade, cefprozil, penicillins           Current Medications (05/14/2020):  This is the current hospital active medication list Current Facility-Administered Medications  Medication Dose Route Frequency Provider Last Rate Last Admin  . acetaminophen (TYLENOL) 160 MG/5ML solution 325 mg  325 mg Per Tube Q4H PRN Collier Bullock, MD   325 mg at 05/08/20 1838  . calcium-vitamin D (OSCAL WITH D) 500-200 MG-UNIT per tablet 1 tablet  1 tablet Oral BID Nita Sells, MD   1 tablet at 05/13/20 2229  . chlorhexidine (PERIDEX) 0.12 % solution 15 mL  15 mL Mouth Rinse BID Olalere, Adewale A, MD   15 mL at 05/13/20 2230  . Chlorhexidine Gluconate Cloth 2 % PADS 6 each  6 each Topical Daily Chesley Mires, MD   6 each at 05/13/20 1044  . diltiazem (CARDIZEM CD) 24 hr capsule 360 mg  360 mg Oral Daily Nita Sells, MD   360 mg at 05/13/20 1845  . DULoxetine (CYMBALTA) DR capsule 60 mg  60 mg Oral Daily Nita Sells, MD   60 mg at 05/13/20 1043  . enoxaparin (LOVENOX) injection 60 mg  60 mg Subcutaneous  Q24H Brand Males, MD   60 mg at 05/13/20 1411  . feeding supplement (ENSURE ENLIVE) (ENSURE ENLIVE) liquid 237 mL  237 mL Oral BID BM Samtani, Jai-Gurmukh, MD      . hydrOXYzine (ATARAX/VISTARIL) tablet 20 mg  20 mg Oral TID PRN Guilford Shi, MD   20 mg at 05/08/20 1838  . insulin aspart (novoLOG) injection 0-15 Units  0-15 Units Subcutaneous Q6H Barb Merino, MD   2 Units at 05/13/20 1846  . insulin detemir (LEVEMIR) injection 15 Units  15 Units Subcutaneous QHS Barb Merino, MD   15 Units at 05/13/20 2230  . labetalol (NORMODYNE) injection 10-20 mg  10-20 mg Intravenous Q2H PRN Eudelia Bunch, RPH   20 mg at 05/08/20 0911  . linagliptin (TRADJENTA) tablet 5 mg  5 mg Per Tube Daily Brand Males, MD   5 mg at 05/13/20 1042  . loperamide (IMODIUM) capsule 2 mg  2 mg Oral Q8H PRN Guilford Shi, MD   2 mg at 05/12/20 1805  . MEDLINE mouth rinse  15 mL Mouth Rinse q12n4p Olalere, Adewale A, MD   15 mL at 05/13/20 1847  . mesalamine (PENTASA) CR capsule 1,000 mg  1,000 mg Oral QID Guilford Shi, MD   1,000 mg at 05/13/20 2229  . metoprolol tartrate (LOPRESSOR) tablet 50 mg  50 mg Per Tube Q8H Olalere, Adewale A, MD   50 mg at 05/14/20 0221  . pneumococcal 23 valent vaccine (PNEUMOVAX-23) injection 0.5 mL  0.5 mL Intramuscular Tomorrow-1000 Adhikari, Amrit, MD      . predniSONE (DELTASONE) tablet 20 mg  20 mg Per NG tube Daily Guilford Shi, MD   20 mg at 05/13/20 1042  . sodium chloride flush (NS) 0.9 % injection 3 mL  3 mL Intravenous Q12H Collier Bullock, MD   3 mL at 05/13/20 2230  . traZODone (DESYREL) tablet 50 mg  50 mg Oral QHS PRN Guilford Shi, MD         Discharge Medications: Please see discharge summary for a list of discharge medications.  Relevant Imaging Results:  Relevant Lab Results:   Additional Information SSN [id number]  Joaquin Courts, RN

## 2020-05-14 NOTE — Discharge Summary (Signed)
Physician Discharge Summary  [name] ZJQ:[id number] DOB: [birth year] DOA: 04/20/2020  PCP: Caryl Bis, MD  Admit date: 04/20/2020 Discharge date: 05/14/2020  Time spent: 45 minutes  Recommendations for Outpatient Follow-up:  1. Continue R MST at facility (see below) with speech therapy--if she does not do well with phonation and vocalization she will need to be referred by skilled nursing facility MD to an ENT to help her meet her goals 2. Graduate glycemic control as needed at outpatient setting resumed on home meds 3. Needs Chem-12, CBC in 1 week 4. Needs chest x-ray in 3 to 4 weeks 5. Outpatient weight loss counseling needed  Discharge Diagnoses:  Principal Problem:   ARDS (adult respiratory distress syndrome) (HCC) Active Problems:   Hypertension complicating diabetes ([place])   Crohn disease (HCC)   IBS (irritable bowel syndrome)   Physical deconditioning   Dysphagia   Pressure injury of sacral region, stage 1   Discharge Condition: Improved  Diet recommendation: Diabetic  Filed Weights   05/08/20 0500 05/09/20 0500 05/13/20 0643  Weight: 121.3 kg 121.3 kg (!) 162.4 kg    History of present illness:  54 year old community dwelling white female severe obesity BMI >47 DM TY two HTN right hip repair 2013, carpal tunnel release 09/2019 with other multiple surgeries 10/2019 Crohn's colitis since 1998 followed by Dr. Melony Overly on Pentasa currently, irritable bowel syndrome  Tested positive Covid 04/08/2020 admitted [facility] 04/16/2020-intubated for VDRF ARDS-long hospital course until extubation 05/04/2020--completed remdesivir, steroids, cefepime  Not given Actemra secondary to Crohn's  Transferred to [facility] 05/06/2020 on 8 L nasal cannula Has core track in place but speech therapy felt she was at high risk for swallowing given dysphagia and prolonged intubation and NG tube Flexi-Seal discontinued 9/10 Diarrhea associated with tube feeds have resolved some  with Loperamide Reevaluated by therapy-unable to tolerate 3 hours a day of the same therefore being discharged to skilled facility  Hospital Course:  1. COVID-19 with ARDS acute respiratory (VDRF 8/17 through 9/5) failure now recovery and on room air a. Status post goal-directed therapies for Covid given other than baricitinib b. Tapering prednisone to 20 mg which is her home dose c. Received vancomycin/cefepime antibiotics finishing 9/8 2. Severe dysphagia n.p.o. at present a. Appreciate speech input, she was graduated steadily through 2 dysphagia 3 diet and will need this on discharge to skilled facility b. Modified barium swallow could not be accomplished because we do not have the facility to do so temporarily at this hospital c. If she still has persisting voice deficits she will need to be set up with ENT in the outpatient setting post acute rehab stay at facility d. Patient was given a [employer] trainer--RMST (respiratory muscle strength training) when seen by speech and is aware of how to use this e. Periodic check CXR if needed 3. Severe debilitation and possible critical illness/steroid-induced myopathy-unsure if she has bilateral foot drop? a. Right upper extremity weakness improved and is feeling stronger b. Does have lower extremity  myopathy, cannot dorsiflex c. Therapy is aware of the same she does not from their perspective require PFO 4. Tube feed associated diarrhea a. Resolved continuing Loperamide 2 mg every 8 5. AKI with hypernatremia a. Tube feeds/free water discontinued 9/12 b. AKI slowly getting better c. Saline lock on 9/14 d. Labs in the next 1 to 2 weeks at facility 6. 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 Continue linagliptin 5 mg, Levemir 15 + sliding scale-CBGs appropriate and she will  resume on discharge her usual medications as per [facility] below 7. Crohn's disease + IBS followed by Dr. Melony Overly previously on Pentasa a. Continue Pentasa 1000 4 times daily  and outpatient follow-up with him b. Continue prednisone 20 which is a home medication c. She will need outpatient follow-up with him 8. HTN 9. Sinus tachycardia a. Continue metoprolol 50 every 8,-switched her Cardizem liquid to Tiazac 360 usual home dose b. Continues to remain in sinus rhythm   Procedures:  Multiple  Consultations:  None  Discharge Exam: Vitals:   05/14/20 0625 05/14/20 1349  BP: 132/63 132/69  Pulse: 72 82  Resp: 14 16  Temp: 99 F (37.2 C) 100.2 F (37.9 C)  SpO2: 95% 94%    General: Awake alert coherent no distress EOMI NCAT thick neck Mallampati 4 no JVD no bruit Phonating well and eating okay had a sandwich yesterday and did not have any issues with the same Cardiovascul S1-S2 no murmur rub or gallop patient was given Respiratory: Clear no added sound no rales no rhonchi Abdomen morbidly obese Power to upper extremities is improved today she is able to raise her arms above her head She still has slightly weak dorsiflexion I think she may have some foot drop Otherwise neurologically 5/5 power just weak  Discharge Instructions   Discharge Instructions    Diet - low sodium heart healthy   Complete by: As directed    Diet Carb Modified   Complete by: As directed    Increase activity slowly   Complete by: As directed    No wound care   Complete by: As directed      Allergies as of 05/14/2020      Reactions   Remicade [infliximab] Swelling   Joints locked up   Cefprozil Rash   Penicillins Rash   Did it involve swelling of the face/tongue/throat, SOB, or low BP? No Did it involve sudden or severe rash/hives, skin peeling, or any reaction on the inside of your mouth or nose? No Did you need to seek medical attention at a hospital or doctor's office? No When did it last happen? If all above answers are "NO", may proceed with cephalosporin use.      Medication List    STOP taking these medications   azithromycin 500 MG  tablet Commonly known as: ZITHROMAX   CALCIUM 600 + D PO   dicyclomine 20 MG tablet Commonly known as: BENTYL   enoxaparin 40 MG/0.4ML injection Commonly known as: LOVENOX   famotidine 20 MG tablet Commonly known as: PEPCID   lisinopril-hydrochlorothiazide 20-12.5 MG tablet Commonly known as: ZESTORETIC   LORazepam 2 MG/ML injection Commonly known as: ATIVAN   Magnesium 400 MG Tabs   mercaptopurine 50 MG tablet Commonly known as: PURINETHOL   spironolactone 25 MG tablet Commonly known as: ALDACTONE     TAKE these medications   acetaminophen 650 MG CR tablet Commonly known as: TYLENOL Take 650 mg by mouth every 4 (four) hours as needed for pain.   albuterol 108 (90 Base) MCG/ACT inhaler Commonly known as: VENTOLIN HFA Inhale 2 puffs into the lungs every 4 (four) hours as needed for wheezing or shortness of breath.   albuterol 90 MCG/ACT inhaler Commonly known as: PROVENTIL,VENTOLIN Inhale 2 puffs into the lungs every 6 (six) hours as needed for wheezing or shortness of breath.   cetirizine 10 MG tablet Commonly known as: ZYRTEC Take 10 mg by mouth every evening.   cholecalciferol 25 MCG (1000 UNIT) tablet Commonly  known as: VITAMIN D3 Take 1 tablet (1,000 Units total) by mouth daily.   diltiazem 360 MG 24 hr capsule Commonly known as: TIAZAC Take 360 mg by mouth daily.   DULoxetine 60 MG capsule Commonly known as: CYMBALTA Take 1 capsule (60 mg total) by mouth daily.   Fluticasone-Salmeterol 250-50 MCG/DOSE Aepb Commonly known as: ADVAIR Inhale 1 puff into the lungs every morning.   gabapentin 300 MG capsule Commonly known as: NEURONTIN Take 300 mg by mouth 2 (two) times daily.   hydrOXYzine 10 MG tablet Commonly known as: ATARAX/VISTARIL Take 2 tablets (20 mg total) by mouth 3 (three) times daily as needed for anxiety.   insulin lispro 100 UNIT/ML injection Commonly known as: HUMALOG Inject 0-15 Units into the skin See admin instructions. 0-15  units three times daily with meals And 2-5 units nightly   Jardiance 25 MG Tabs tablet Generic drug: empagliflozin Take 25 mg by mouth daily.   loperamide 2 MG capsule Commonly known as: IMODIUM Take 2 mg by mouth as needed for diarrhea or loose stools.   mesalamine 500 MG CR capsule Commonly known as: Pentasa TAKE TWO (2) CAPSULES BY MOUTH FOUR TIMES DAILY   metoprolol tartrate 50 MG tablet Commonly known as: LOPRESSOR Place 1 tablet (50 mg total) into feeding tube every 8 (eight) hours.   multivitamin with minerals Tabs tablet Take 1 tablet by mouth every morning.   OMEGA-3 EPA FISH OIL PO Take by mouth. 1,000 1 cap once a day   pravastatin 20 MG tablet Commonly known as: PRAVACHOL Take 20 mg by mouth daily.   predniSONE 20 MG tablet Commonly known as: DELTASONE 1 tablet (20 mg total) by Per NG tube route daily. Start taking on: May 15, 2020 What changed:   how much to take  how to take this   traZODone 50 MG tablet Commonly known as: DESYREL Take 1 tablet (50 mg total) by mouth at bedtime as needed for sleep. What changed:   how much to take  when to take this  reasons to take this  Another medication with the same name was removed. Continue taking this medication, and follow the directions you see here.            Durable Medical Equipment  (From admission, onward)         Start     Ordered   05/12/20 0931  For home use only DME Specialty mattress  Once        05/12/20 0931         Allergies  Allergen Reactions  . Remicade [Infliximab] Swelling    Joints locked up  . Cefprozil Rash  . Penicillins Rash    Did it involve swelling of the face/tongue/throat, SOB, or low BP? No Did it involve sudden or severe rash/hives, skin peeling, or any reaction on the inside of your mouth or nose? No Did you need to seek medical attention at a hospital or doctor's office? No When did it last happen? If all above answers are "NO", may  proceed with cephalosporin use.       The results of significant diagnostics from this hospitalization (including imaging, microbiology, ancillary and laboratory) are listed below for reference.    Significant Diagnostic Studies: DG Abd 1 View  Result Date: 04/27/2020 CLINICAL DATA:  Evaluate feeding tube EXAM: ABDOMEN - 1 VIEW COMPARISON:  April 23, 2020 FINDINGS: The distal tip of the feeding tube is in the distal stomach. IMPRESSION: The distal tip  of the feeding tube is in the distal stomach. Recommend advancement into the distal duodenum before use. Electronically Signed   By: Dorise Bullion III M.D   On: 04/27/2020 17:14   DG Abd 1 View  Result Date: 04/23/2020 CLINICAL DATA:  Encounter for NG tube placement. EXAM: ABDOMEN - 1 VIEW COMPARISON:  Earlier today. FINDINGS: The weighted enteric tube tip is in the right upper quadrant, in the region of the distal stomach/first portion of the duodenum. Nonobstructive bowel gas pattern. Surgical clips in the right upper quadrant from cholecystectomy. IMPRESSION: Tip of the weighted enteric tube in the distal stomach/first portion of the duodenum. Electronically Signed   By: Keith Rake M.D.   On: 04/23/2020 17:17   DG Abd 1 View  Result Date: 04/23/2020 CLINICAL DATA:  Feeding tube placement EXAM: ABDOMEN - 1 VIEW COMPARISON:  04/22/2019 FINDINGS: Feeding tube with the tip projecting over the stomach. No bowel dilatation to suggest obstruction. No evidence of pneumoperitoneum, portal venous gas or pneumatosis. No pathologic calcifications along the expected course of the ureters. No acute osseous abnormality. IMPRESSION: Feeding tube with the tip projecting over the stomach. Electronically Signed   By: Kathreen Devoid   On: 04/23/2020 14:56   DG Abd 1 View  Result Date: 04/21/2020 CLINICAL DATA:  NG tube placement EXAM: ABDOMEN - 1 VIEW COMPARISON:  None. FINDINGS: NG tube tip and side port project within the stomach. Small amount of bowel  gas generally. IMPRESSION: NG tube tip and side port in the stomach. Electronically Signed   By: Ulyses Jarred M.D.   On: 04/21/2020 00:39   DG Chest Port 1 View  Result Date: 05/05/2020 CLINICAL DATA:  Respiratory failure, COVID EXAM: PORTABLE CHEST 1 VIEW COMPARISON:  05/03/2020 FINDINGS: Multifocal patchy opacities, right lower lobe predominant. No pleural effusion or pneumothorax. Interval extubation. The heart is normal in size. Enteric tube courses stomach. IMPRESSION: Interval extubation. Multifocal pneumonia in this patient with known COVID. Electronically Signed   By: Julian Hy M.D.   On: 05/05/2020 04:40   DG CHEST PORT 1 VIEW  Result Date: 05/03/2020 CLINICAL DATA:  COVID positive.  Respiratory failure. EXAM: PORTABLE CHEST 1 VIEW COMPARISON:  05/02/2020 FINDINGS: The endotracheal tube tip is stable above the carina. There is a feeding tube with tip below the field of view. Right upper extremity PICC line tip projects over the cavoatrial junction. Mild hazy interstitial opacities within the left lung and right base are again noted. When compared with the previous exam there is been re-expansion of the right lower lobe. IMPRESSION: 1. Interval re-expansion of the right lower lobe. 2. Stable support apparatus. 3. Persistent mild hazy interstitial opacities. Electronically Signed   By: Kerby Moors M.D.   On: 05/03/2020 08:28   DG CHEST PORT 1 VIEW  Result Date: 05/02/2020 CLINICAL DATA:  Shortness of breath.  COVID positive EXAM: PORTABLE CHEST 1 VIEW COMPARISON:  Yesterday FINDINGS: Endotracheal tube tip is at the clavicular heads. The feeding tube at least reaches the stomach. Right PICC with tip at the upper cavoatrial junction. Hazy density at the right base with obscured diaphragm. Mild improvement of aeration on the left where there is streaky type density. No visible pneumothorax. Artifact from support hardware. IMPRESSION: 1. Stable hardware positioning. 2. Suspect right lower  lobe collapse. Generalized airspace disease has improved since admission radiographs. Electronically Signed   By: Monte Fantasia M.D.   On: 05/02/2020 10:28   DG Chest Port 1 View  Result Date:  05/01/2020 CLINICAL DATA:  Respiratory failure.  COVID positive. EXAM: PORTABLE CHEST 1 VIEW COMPARISON:  04/28/2020 FINDINGS: 0510 hours. Endotracheal tube tip is 1.6 cm above the base of the carina. A feeding tube passes into the stomach although the distal tip position is not included on the film. Right PICC line tip overlies the expected position of the mid to distal SVC. Progressive bibasilar collapse/consolidation with cardiomegaly and vascular congestion. Potential layering bilateral pleural effusions. IMPRESSION: 1. Progressive bibasilar collapse/consolidation with cardiomegaly and vascular congestion. 2. Possible layering bilateral pleural effusions. Electronically Signed   By: Misty Stanley M.D.   On: 05/01/2020 07:50   DG CHEST PORT 1 VIEW  Result Date: 04/28/2020 CLINICAL DATA:  COVID-19 positive. Shortness of breath. ETT placement. EXAM: PORTABLE CHEST 1 VIEW COMPARISON:  April 27, 2020 FINDINGS: The ETT is in good position. The feeding tube is not well seen distally due to poor penetration but appears to terminate below today's film. Stable right PICC line. No pneumothorax. No focal infiltrate on the right. Persistent infiltrate in the left base. Possible layering effusion. IMPRESSION: 1. Possible left layering effusion. Infiltrate in left base is stable. No focal infiltrate seen on the right on today's study. 2. Support apparatus as above. Electronically Signed   By: Dorise Bullion III M.D   On: 04/28/2020 11:56   DG CHEST PORT 1 VIEW  Result Date: 04/27/2020 CLINICAL DATA:  Respiratory failure EXAM: PORTABLE CHEST 1 VIEW COMPARISON:  April 06, 2020 FINDINGS: The right PICC line is stable. The feeding tube terminates below today's film. The ETT is in good position. No pneumothorax. Bilateral  patchy pulmonary infiltrates are similar in the interval. No interval changes. IMPRESSION: 1. Support apparatus as above. 2. Bilateral pulmonary infiltrates are similar in the interval. Recommend follow-up to resolution. Electronically Signed   By: Dorise Bullion III M.D   On: 04/27/2020 11:18   DG CHEST PORT 1 VIEW  Result Date: 04/26/2020 CLINICAL DATA:  COVID. EXAM: PORTABLE CHEST 1 VIEW COMPARISON:  04/25/2020. FINDINGS: Endotracheal tube, feeding tube, right PICC line in stable position. Heart size stable. Diffuse bilateral pulmonary infiltrates again noted. Similar findings on prior exam. No pleural effusion or pneumothorax. IMPRESSION: 1.  Lines and tubes in stable position. 2. Diffuse bilateral pulmonary infiltrates again noted without interim change. Electronically Signed   By: Marcello Moores  Register   On: 04/26/2020 06:37   DG Chest Port 1 View  Result Date: 04/25/2020 CLINICAL DATA:  COVID 19. EXAM: PORTABLE CHEST 1 VIEW COMPARISON:  04/24/2020. FINDINGS: Endotracheal tube, feeding tube, right PICC line stable position. Heart size normal. Diffuse bilateral pulmonary infiltrates again noted without interim change. Interim improvement of right perihilar atelectasis. No pleural effusion or pneumothorax. IMPRESSION: 1.  Lines and tubes in stable position. 2. Diffuse bilateral pulmonary infiltrates again noted without interim change. Interim improvement of right perihilar atelectasis. Electronically Signed   By: Marcello Moores  Register   On: 04/25/2020 06:43   DG CHEST PORT 1 VIEW  Result Date: 04/24/2020 CLINICAL DATA:  54 year old female with history of adult respiratory distress syndrome. EXAM: PORTABLE CHEST 1 VIEW COMPARISON:  Chest x-ray 04/24/2020. FINDINGS: An endotracheal tube is in place with tip 3.7 cm above the carina. A feeding tube is seen extending into the abdomen, however, the tip of the feeding tube extends below the lower margin of the image. Lung volumes are normal. Patchy multifocal  interstitial and ill-defined airspace opacities are noted throughout the lungs bilaterally, with slightly improved aeration compared to the prior examination.  Trace left pleural effusion. No right pleural effusion. No definite evidence of pulmonary edema. Heart size is normal. Upper mediastinal contours are within normal limits. IMPRESSION: 1. Support apparatus, as above. 2. The appearance the chest is most suggestive of severe multilobar bilateral pneumonia, with slight improvement in aeration compared to the prior examination, as above. Electronically Signed   By: Vinnie Langton M.D.   On: 04/24/2020 15:26   DG CHEST PORT 1 VIEW  Result Date: 04/24/2020 CLINICAL DATA:  Hypoxia EXAM: PORTABLE CHEST 1 VIEW COMPARISON:  April 23, 2020 FINDINGS: Endotracheal tube tip is 2.4 cm above the carina. Feeding tube tip is below the diaphragm. Central catheter tip is in the superior vena cava. No pneumothorax. There is airspace opacity throughout the lungs bilaterally, similar to 1 day prior. No new opacity evident. Heart size and pulmonary vascularity are within normal limits. No evident adenopathy. No bone lesions. IMPRESSION: Tube and catheter positions as described without pneumothorax. Airspace opacity throughout the lungs bilaterally, stable. Stable cardiac silhouette. Electronically Signed   By: Lowella Grip III M.D.   On: 04/24/2020 08:10   DG Chest Port 1 View  Result Date: 04/23/2020 CLINICAL DATA:  Endotracheal tube placement, moving patient from prone to supine. EXAM: PORTABLE CHEST 1 VIEW COMPARISON:  Radiograph earlier this day. FINDINGS: Borderline low positioning of the endotracheal tube tip 13 mm from the carina. Enteric tube in place, tip included on concurrent abdominal radiographs. There is a right upper extremity PICC with tip in the mid SVC. Slight improvement in heterogeneous bilateral lung opacities from earlier today. Stable heart size and mediastinal contours. No evidence of  pneumothorax or large pleural effusion. No visualized pneumomediastinum. IMPRESSION: 1. Borderline low positioning of the endotracheal tube tip 13 mm from the carina. 2. Right upper extremity PICC with tip in the mid SVC. 3. Slight improvement in bilateral heterogeneous lung opacities from earlier today. Electronically Signed   By: Keith Rake M.D.   On: 04/23/2020 17:19   DG CHEST PORT 1 VIEW  Result Date: 04/23/2020 CLINICAL DATA:  COVID-19, intubation EXAM: PORTABLE CHEST 1 VIEW COMPARISON:  Portable exam 0448 hours compared to [id number] hours FINDINGS: Tip of endotracheal tube projects approximately 2.1 cm above carina. Nasogastric tube extends into stomach. RIGHT arm PICC line unchanged. Stable heart size mediastinal contours. Severe diffuse BILATERAL airspace infiltrates consistent with multifocal pneumonia and history of COVID-19, slightly increased in RIGHT upper lobe. No pleural effusion or pneumothorax. IMPRESSION: Multifocal pneumonia consistent with history of COVID-19, slightly increased. Electronically Signed   By: Lavonia Dana M.D.   On: 04/23/2020 07:16   DG Chest Port 1 View  Result Date: 04/22/2020 CLINICAL DATA:  Endotracheal tube placement EXAM: PORTABLE CHEST 1 VIEW COMPARISON:  4:56 a.m. FINDINGS: Endotracheal tube is seen 3.6 cm above the carina. Right upper extremity PICC line with its tip within the superior cavoatrial junction and nasogastric tube extending into the upper abdomen beyond the margin of the examination are unchanged. Extensive multifocal airspace infiltrates are again identified, progressive within the right upper lobe. There is diminished pulmonary insufflation when compared to prior examination. Cardiac size is at the upper limits of normal. No pneumothorax or pleural effusion. IMPRESSION: 1. Lines and tubes in satisfactory position. 2. Extensive multifocal airspace infiltrates, progressive within the right upper lobe. 3. Diminished pulmonary vascular  insufflation when compared to prior examination. Electronically Signed   By: Fidela Salisbury MD   On: 04/22/2020 20:58   DG Chest Port 1 View  Result Date:  04/22/2020 CLINICAL DATA:  Respiratory failure. ARDS secondary to COVID 19 virus. EXAM: PORTABLE CHEST 1 VIEW COMPARISON:  One-view chest x-ray 04/21/2020 FINDINGS: Heart size is exaggerated by low volumes. Diffuse interstitial and airspace opacities are similar the prior exams. Tracheal tube enteric tube are stable. IMPRESSION: Stable appearance of diffuse interstitial and airspace disease consistent with ARDS. Electronically Signed   By: San Morelle M.D.   On: 04/22/2020 06:30   DG CHEST PORT 1 VIEW  Result Date: 04/21/2020 CLINICAL DATA:  Endotracheal tube placement EXAM: PORTABLE CHEST 1 VIEW COMPARISON:  04/20/2020 FINDINGS: Endotracheal tube tip is at the level of the clavicular heads. Esophageal tube courses beyond the field of view. Bilateral upper lobe predominant airspace opacities are unchanged. IMPRESSION: 1. Endotracheal tube tip at the level of the clavicular heads. 2. Unchanged bilateral upper lobe predominant airspace opacities. Electronically Signed   By: Ulyses Jarred M.D.   On: 04/21/2020 00:38   ECHOCARDIOGRAM COMPLETE  Result Date: 04/24/2020    ECHOCARDIOGRAM REPORT   Patient Name:   BRADYN VASSEY Date of Exam: 04/24/2020 Medical Rec #:  [id number]          Height:       63.0 in Accession #:    [id number]         Weight:       276.9 lb Date of Birth:  1965-11-12         BSA:          2.220 m Patient Age:    54 years           BP:           148/55 mmHg Patient Gender: F                  HR:           81 bpm. Exam Location:  Inpatient Procedure: 2D Echo Indications:    acute respiratory insufficiency 518.82  History:        Patient has no prior history of Echocardiogram examinations.                 Risk Factors:Diabetes, Dyslipidemia and Hypertension. Covid +.  Sonographer:    Jannett Celestine RDCS (AE) Referring Phys:  [id number] MURALI RAMASWAMY  Sonographer Comments: Echo performed with patient supine and on artificial respirator. Image acquisition challenging due to patient body habitus and Image acquisition challenging due to respiratory motion. IMPRESSIONS  1. Left ventricular ejection fraction, by estimation, is 55 to 60%. The left ventricle has normal function. Left ventricular endocardial border not optimally defined to evaluate regional wall motion. Left ventricular diastolic parameters are consistent with Grade I diastolic dysfunction (impaired relaxation).  2. Right ventricular systolic function is normal. The right ventricular size is normal. Tricuspid regurgitation signal is inadequate for assessing PA pressure.  3. The mitral valve is grossly normal. No evidence of mitral valve regurgitation. No evidence of mitral stenosis.  4. The aortic valve is grossly normal. Aortic valve regurgitation is not visualized. No aortic stenosis is present. FINDINGS  Left Ventricle: Left ventricular ejection fraction, by estimation, is 55 to 60%. The left ventricle has normal function. Left ventricular endocardial border not optimally defined to evaluate regional wall motion. The left ventricular internal cavity size was normal in size. There is no left ventricular hypertrophy. Left ventricular diastolic parameters are consistent with Grade I diastolic dysfunction (impaired relaxation). Right Ventricle: The right ventricular size is normal. No increase in right ventricular wall thickness. Right  ventricular systolic function is normal. Tricuspid regurgitation signal is inadequate for assessing PA pressure. Left Atrium: Left atrial size was normal in size. Right Atrium: Right atrial size was normal in size. Pericardium: Trivial pericardial effusion is present. Presence of pericardial fat pad. Mitral Valve: The mitral valve is grossly normal. There is mild calcification of the anterior mitral valve leaflet(s). Mild mitral annular calcification.  No evidence of mitral valve regurgitation. No evidence of mitral valve stenosis. Tricuspid Valve: The tricuspid valve is grossly normal. Tricuspid valve regurgitation is trivial. No evidence of tricuspid stenosis. Aortic Valve: The aortic valve is grossly normal. Aortic valve regurgitation is not visualized. No aortic stenosis is present. Pulmonic Valve: The pulmonic valve was grossly normal. Pulmonic valve regurgitation is not visualized. No evidence of pulmonic stenosis. Aorta: The aortic root is normal in size and structure. Venous: IVC assessment for right atrial pressure unable to be performed due to mechanical ventilation. IAS/Shunts: The atrial septum is grossly normal.  LEFT VENTRICLE PLAX 2D LVIDd:         3.90 cm  Diastology LVIDs:         2.40 cm  LV e' lateral:   8.16 cm/s LV PW:         1.00 cm  LV E/e' lateral: 9.0 LV IVS:        1.00 cm  LV e' medial:    6.74 cm/s LVOT diam:     2.00 cm  LV E/e' medial:  10.9 LV SV:         58 LV SV Index:   26 LVOT Area:     3.14 cm  LEFT ATRIUM           Index LA diam:      2.90 cm 1.31 cm/m LA Vol (A4C): 18.1 ml 8.15 ml/m  AORTIC VALVE LVOT Vmax:   80.30 cm/s LVOT Vmean:  52.900 cm/s LVOT VTI:    0.186 m  AORTA Ao Root diam: 2.90 cm MITRAL VALVE MV Area (PHT): 2.42 cm    SHUNTS MV Decel Time: 313 msec    Systemic VTI:  0.19 m MV E velocity: 73.30 cm/s  Systemic Diam: 2.00 cm MV A velocity: 75.40 cm/s MV E/A ratio:  0.97 Eleonore Chiquito MD Electronically signed by Eleonore Chiquito MD Signature Date/Time: 04/24/2020/4:23:44 PM    Final     Microbiology: No results found for this or any previous visit (from the past 240 hour(s)).   Labs: Basic Metabolic Panel: Recent Labs  Lab 05/08/20 0412 05/09/20 0602 05/10/20 0636 05/11/20 0553 05/12/20 0500 05/13/20 0516 05/14/20 0626  NA 139   < > 141 144 141 141 142  K 3.5   < > 4.1 3.9 3.6 3.6 3.7  CL 110   < > 106 109 107 107 107  CO2 22   < > 22 22 23 25 24   GLUCOSE 185*   < > 170* 141* 111* 99 112*  BUN  20   < > 36* 33* 23* 23* 25*  CREATININE <0.30*   < > 1.36* 0.75 0.69 0.81 0.96  CALCIUM 7.8*   < > 8.0* 8.5* 8.3* 8.3* 8.6*  MG 2.0  --   --   --   --   --   --   PHOS 3.3  --   --   --   --   --   --    < > = values in this interval not displayed.   Liver Function Tests: Recent Labs  Lab 05/12/20 0500 05/13/20 0516 05/14/20 0626  AST 18 22 57*  ALT 49* 47* 86*  ALKPHOS 69 92 266*  BILITOT 1.0 0.8 0.9  PROT 5.9* 5.6* 5.6*  ALBUMIN 2.6* 2.5* 2.5*   No results for input(s): LIPASE, AMYLASE in the last 168 hours. No results for input(s): AMMONIA in the last 168 hours. CBC: Recent Labs  Lab 05/08/20 0412 05/11/20 0553 05/12/20 0500 05/13/20 0516 05/14/20 0626  WBC 13.7* 9.6 9.1 6.1 6.5  NEUTROABS 9.5*  --  7.0 4.1 4.9  HGB 11.3* 10.5* 10.7* 10.1* 10.0*  HCT 33.6* 32.6* 32.4* 31.2* 30.4*  MCV 101.2* 102.5* 102.2* 102.6* 102.4*  PLT 148* 131* 133* 138* 142*   Cardiac Enzymes: No results for input(s): CKTOTAL, CKMB, CKMBINDEX, TROPONINI in the last 168 hours. BNP: BNP (last 3 results) No results for input(s): BNP in the last 8760 hours.  ProBNP (last 3 results) No results for input(s): PROBNP in the last 8760 hours.  CBG: Recent Labs  Lab 05/13/20 1245 05/13/20 1831 05/13/20 2355 05/14/20 0618 05/14/20 1126  GLUCAP 152* 131* 105* 107* 132*       Signed:  Nita Sells MD   [facility] 05/14/2020, 3:30 PM

## 2020-05-14 NOTE — Progress Notes (Signed)
  Speech Language Pathology Treatment:    Patient Details Name: [name] MRN: [id number] DOB: June 02, 1966 Today's Date: 05/14/2020 Time: 1920-1930 SLP Time Calculation (min) (ACUTE ONLY): 10 min  Assessment / Plan / Recommendation Clinical Impression  Today pt seen to assure tolerance of dietary advancement to dys3/thin and tolerance/use of RMST trainers. Pt reports she consumed a [employer] sandwich last night with good toleration - no coughing or dysphagia - stating "They smashed down the bread.     RMST conducted today per pt - 10 repetitions of IMT and PEP BID, did not conduct 3rd exercise due to discomfort of stomach.  Pt able to verbalize RMST use and contraindications.  Pt reports muscular effort for inspiratory was only 6/10 overall. Will follow up tomorrow to modify as indication, suspect task familiarity has helped with pt's skill level.  RN observed providing pt medications with pudding - with pt NOT demonstrating any indication of dysphagia or airway compromise.     Pt does admit to clearing her throat once today during intake but she denies having dysphagia or sensing aspiration.  Milta Deiters, spouse, present and with direct questions advised pt's voice is hoarse but stronger daily. He does ont recall if pt would frequently clear her throat during eating prior to admission, to which pt stated "He doesn't pay attention to stuff like that".     Of note, SLP reached out to RD as pt is consuming Ensure and she is worried re: carb content and she is requesting Glucerna.  Pt is continuing to make progress with intake, comprehension/utilization of RMST and swallowing ability.  Will follow up next date to modify RMST as indicated. Thanks for allowing me to help care for this pt.    HPI HPI: 54 yo female adm to [facility] on 8/21 from [place] after admitted there on 8/17 - after having COVID with respiratory failure.  Pt treated with remedsivir, prednisone, ABX for CAP.  Pt found to have ARDS,  pneumonia, VDRF- intubated 8/21-9/4.  Pt was started on a diet over the weekend of dys2/thin with small bore feeding tube removed.  She has continued to make progress with stronger voice, improved swallowing/strength.  Today pt seen to assess po tolerance, determine readiness for dietary advancement and indication for instrumental evaluation *flouro machine remains down.      SLP Plan  Continue with current plan of care (defer MBS due to pt's clinical progress)       Recommendations  Liquids provided via: Straw (given small, single sips) Medication Administration: Other (Comment) (with pudding) Supervision: Trained caregiver to feed patient;Staff to assist with self feeding Compensations: Slow rate;Small sips/bites Postural Changes and/or Swallow Maneuvers: Seated upright 90 degrees;Upright 30-60 min after meal                Oral Care Recommendations: Oral care QID;Oral care before and after PO Follow up Recommendations: Skilled Nursing facility SLP Visit Diagnosis: Dysphagia, oropharyngeal phase (R13.12) Plan: Continue with current plan of care (defer MBS due to pt's clinical progress)       GO              Kathleen Lime, MS [place] Office [phone]   Macario Golds 05/14/2020, 7:44 PM

## 2020-05-14 NOTE — Progress Notes (Signed)
Physical Therapy Treatment Patient Details Name: [name] MRN: [id number] DOB: 04/15/1966 Today's Date: 05/14/2020    History of Present Illness 54 yo female tested positive for COVID 19 on 8/09.  Had progressive symptoms and admitted to [facility] on 8/17.  Treated with remdesivir, prednisone and ABx for CAP.  She developed worsening hypoxia and required intubation 04/21/20 AND  was transferred TO [facility]  with ARDS. Extubated 05/04/20.  RKY:[id number]'[id number] colitis, Carpal tunnel, Osteoarthritis, DM type 2, Asthma, HTN, HLD, OSA, Morbid obesity with BMI 49.64, Depression    PT Comments    Pt is progressing every session  General bed mobility comments: increased self ability to roll siade to side with increased functional use B UE's on rail as well as increased self mobility B LE's/trunk.  Scooting is still difficult but Air mattress/bed inhibts as well.  Once upright, pt able to static sit EOB at Supervision level > 10 min before c/o fatigue. General transfer comment: + 2 side by side pt was able to stand with staedy but with flaps down.  Unable to support herself fully upright just yet. Pt initially leaning forearn on front bar so redirected her to adjust trunk upright and keep elbows off support bar.  Pt was able to maintain this posture 2 min before signs of fatigue(tremors) upper body and c/o fatigue.  Allowed pt to rest with forearms on front bar but still in a partial stance in Stedy sitting on back flaps.  Repeated another min upright posture.  Moved stedy to recliner and assisted with a partial stancwe to remove back flaps and assisted to chair. "plop"   Total partial upright in steady was 7 min. Performed some B LE TE's 15 resp HS, LAQ's, 10 reps marching as well as dynamic trunk activities.  Pt tolerated sitting edge of recliner unsupported x 13 min.    Follow Up Recommendations  CIR     Equipment Recommendations       Recommendations for Other Services Rehab consult      Precautions / Restrictions Precautions Precautions: Fall Precaution Comments: post VDRF, SIRS, prolonged hospital stay Required Braces or Orthoses: Other Brace Other Brace: prevalon boots Restrictions Weight Bearing Restrictions: No    Mobility  Bed Mobility Overal bed mobility: Needs Assistance Bed Mobility: Supine to Sit Rolling: Mod assist   Supine to sit: Mod assist     General bed mobility comments: increased self ability to roll siade to side with increased functional use B UE's on rail as well as increased self mobility B LE's/trunk.  Scooting is still difficult but Air mattress/bed inhibts as well.  Once upright, pt able to static sit EOB at Supervision level > 10 min before c/o fatigue.  Transfers Overall transfer level: Needs assistance   Transfers: Sit to/from Stand Sit to Stand: Max assist;+2 physical assistance;+2 safety/equipment         General transfer comment: + 2 side by side pt was able to stand with staedy but with flaps down.  Unable to support herself fully upright just yet. Pt initially leaning forearn on front bar so redirected her to adjust trunk upright and keep elbows off support bar.  Pt was able to maintain this posture 2 min before signs of fatigue(tremors) upper body and c/o fatigue.  Allowed pt to rest with forearms on front bar but still in a partial stance in Stedy sitting on back flaps.  Repeated another min upright posture.  Moved stedy to recliner and assisted with a partial stancwe  to remove back flaps and assisted to chair. "plop"   Total partial upright in steady was 7 min.  Ambulation/Gait             General Gait Details: non amb since admitted to [facility] ED Aug 9th but started pre gait standing in stedy   Stairs             Wheelchair Mobility    Modified Rankin (Stroke Patients Only)       Balance                                            Cognition Arousal/Alertness: Awake/alert Behavior  During Therapy: WFL for tasks assessed/performed Overall Cognitive Status: Within Functional Limits for tasks assessed                                 General Comments: AxO x 3 very pleasant and more and more motivated to "do what I have to do" to gwet better.      Exercises General Exercises - Upper Extremity Elbow Flexion: AROM;10 reps (with cane as dowel) Elbow Extension: AROM;10 reps;Other (comment) (with cane as dowel) Wrist Flexion: AROM;Both;10 reps Wrist Extension: AROM;10 reps;Both Digit Composite Flexion: AROM;10 reps;Both Composite Extension: AROM;Both;10 reps Other Exercises Other Exercises: Fully recliner position, pt used light weight cane as dowel rod.  Performed 10 reps x 2 sets of BUE ceiling punches with min guard  and frequent verbal cues for paced  breathing and to support cane as needed to maximize shoulder flexion. Pt ed to perform supine to bed for increased ease. Other Exercises: Isometric shoulder adduction with 3 sec hold each x5 reps. Isometric grip sterngthening x10 with 5 second holds. Use of red foam grip for concentric grip strengthening with in-hand manipulation added to each rep x10 each hand. Other Exercises: Shoudler horiz add/abd with use of cane x10 reps. Forearm pro-sup x10 rep holding soda bottle ~2/3 full to allow for weight assisted supination stretch each hand. Other Exercises: Handout provided explaning all exercises with pictures.    General Comments        Pertinent Vitals/Pain Pain Assessment: No/denies pain    Home Living                      Prior Function            PT Goals (current goals can now be found in the care plan section) Acute Rehab PT Goals Patient Stated Goal: To go to rehab for 20 days and then home. Progress towards PT goals: Progressing toward goals    Frequency    Min 3X/week      PT Plan Current plan remains appropriate    Co-evaluation              AM-PAC PT "6 Clicks"  Mobility   Outcome Measure  Help needed turning from your back to your side while in a flat bed without using bedrails?: A Lot Help needed moving from lying on your back to sitting on the side of a flat bed without using bedrails?: A Lot Help needed moving to and from a bed to a chair (including a wheelchair)?: Total Help needed standing up from a chair using your arms (e.g., wheelchair or bedside chair)?: Total Help needed to walk in  hospital room?: Total Help needed climbing 3-5 steps with a railing? : Total 6 Click Score: 8    End of Session   Activity Tolerance: Patient tolerated treatment well Patient left: in chair;with call bell/phone within reach Nurse Communication: Mobility status;Need for lift equipment PT Visit Diagnosis: Unsteadiness on feet (R26.81);Muscle weakness (generalized) (M62.81);Other symptoms and signs involving the nervous system (R29.898);Difficulty in walking, not elsewhere classified (R26.2)     Time: 1224-4975 PT Time Calculation (min) (ACUTE ONLY): 43 min  Charges:  $Gait Training: 8-22 mins $Therapeutic Exercise: 8-22 mins $Therapeutic Activity: 8-22 mins                     {Dariya Gainer  PTA Acute  Rehabilitation Services Pager      [phone] Office      [phone]

## 2020-05-14 NOTE — TOC Progression Note (Signed)
Transition of Care [facility]) - Progression Note    Patient Details  Name: [name] MRN: [id number] Date of Birth: 1965/09/24  [facility]) CM/SW Contact  Meliya Mcconahy, Marjie Skiff, RN Phone Number: 05/14/2020, 2:53 PM  Clinical Narrative:    This CM was alerted this am that pt plan has now changed to SNF. Pt given bed offers and [facility] was chosen. [facility] liaison contacted to accept bed. Facility to start [place]. Hopeful for dc to SNF tomorrow.   Expected Discharge Plan: [place] Barriers to Discharge: Continued Medical Work up  Expected Discharge Plan and Services Expected Discharge Plan: [place]   Discharge Planning Services: CM Consult   Living arrangements for the past 2 months: [place]                                       Social Determinants of Health (SDOH) Interventions    Readmission Risk Interventions Readmission Risk Prevention Plan 05/10/2020  Transportation Screening Complete  Medication Review [employer]) Complete  PCP or Specialist appointment within 3-5 days of discharge Complete  Palliative Care Screening Not Applicable  Some recent data might be hidden

## 2020-05-14 NOTE — TOC Progression Note (Signed)
Transition of Care [facility]) - Progression Note    Patient Details  Name: [name] MRN: [id number] Date of Birth: 03/29/1966  Transition of Care [facility]) CM/SW Contact  Joaquin Courts, RN Phone Number: 05/14/2020, 9:41 AM  Clinical Narrative:    [facility] faxed out to area facilities.    Expected Discharge Plan: [place] (from [place]) Barriers to Discharge: Continued Medical Work up  Expected Discharge Plan and Services Expected Discharge Plan: [place] (from [place])   Discharge Planning Services: CM Consult   Living arrangements for the past 2 months: [place]                                       Social Determinants of Health (SDOH) Interventions    Readmission Risk Interventions Readmission Risk Prevention Plan 05/10/2020  Transportation Screening Complete  Medication Review [employer]) Complete  PCP or Specialist appointment within 3-5 days of discharge Complete  Palliative Care Screening Not Applicable  Some recent data might be hidden

## 2020-05-14 NOTE — Progress Notes (Signed)
Occupational Therapy Treatment Patient Details Name: [name] MRN: [id number] DOB: 11/19/1965 Today's Date: 05/14/2020    History of present illness 54 yo female tested positive for COVID 19 on 8/09.  Had progressive symptoms and admitted to [facility] on 8/17.  Treated with remdesivir, prednisone and ABx for CAP.  She developed worsening hypoxia and required intubation 04/21/20 AND  was transferred TO [facility]  with ARDS. Extubated 05/04/20.  KNL:[id number] colitis, Carpal tunnel, Osteoarthritis, DM type 2, Asthma, HTN, HLD, OSA, Morbid obesity with BMI 49.64, Depression   OT comments  Patient progressing and showed improved ability to perform ceiling punch with BUEs using lightweight cane as dowel rod in fully reclined position in order to allow for scapular stabilization. Pt also showing improved grip strength for self-feeding with modified enlarged handles, but with quick mm fatigue.  Patient limited by profound UE weakness, along with deficits noted below. Pt continues to demonstrate good rehab potential and would benefit from continued skilled OT to increase safety and independence with ADLs and functional transfers to allow pt to return home safely and reduce caregiver burden and fall risk.   Follow Up Recommendations  SNF;CIR    Equipment Recommendations  Hospital bed;Tub/shower bench    Recommendations for Other Services      Precautions / Restrictions Precautions Precautions: Fall Precaution Comments: post VDRF, SIRS, B foot drop, prolonged hospital stay Required Braces or Orthoses: Other Brace Other Brace: prevalon boots Restrictions Weight Bearing Restrictions: No       Mobility Bed Mobility    Pt up in recliner.              Transfers    NT                  Balance                                           ADL either performed or assessed with clinical judgement   ADL Overall ADL's : Needs assistance/impaired (Worked on UE  exercises today to allow for increased ease and decreased assitance with ADL performance.)                                             Vision       Perception     Praxis      Cognition  A&Ox4                                              Exercises General Exercises - Upper Extremity Elbow Flexion: AROM;10 reps (with cane as dowel) Elbow Extension: AROM;10 reps;Other (comment) (with cane as dowel) Wrist Flexion: AROM;Both;10 reps Wrist Extension: AROM;10 reps;Both Digit Composite Flexion: AROM;10 reps;Both Composite Extension: AROM;Both;10 reps Other Exercises Other Exercises: Fully recliner position, pt used light weight cane as dowel rod.  Performed 10 reps x 2 sets of BUE ceiling punches with min guard  and frequent verbal cues for paced  breathing and to support cane as needed to maximize shoulder flexion. Pt ed to perform supine to bed for increased ease. Other Exercises: Isometric shoulder adduction with 3 sec hold each x5 reps.  Isometric grip sterngthening x10 with 5 second holds. Use of red foam grip for concentric grip strengthening with in-hand manipulation added to each rep x10 each hand. Other Exercises: Shoudler horiz add/abd with use of cane x10 reps. Forearm pro-sup x10 rep holding soda bottle ~2/3 full to allow for weight assisted supination stretch each hand. Other Exercises: Handout provided explaning all exercises with pictures.   Shoulder Instructions       General Comments      Pertinent Vitals/ Pain          Home Living                                          Prior Functioning/Environment              Frequency           Progress Toward Goals  OT Goals(current goals can now be found in the care plan section)  Progress towards OT goals: Progressing toward goals  Acute Rehab OT Goals Patient Stated Goal: To go to rehab for 20 days and then home. OT Goal Formulation: With  patient Time For Goal Achievement: 05/17/20 Potential to Achieve Goals: Good  Plan Discharge plan remains appropriate    Co-evaluation                 AM-PAC OT "6 Clicks" Daily Activity     Outcome Measure   Help from another person eating meals?: A Little (with foam adapted grip) Help from another person taking care of personal grooming?: A Lot Help from another person toileting, which includes using toliet, bedpan, or urinal?: Total Help from another person bathing (including washing, rinsing, drying)?: A Lot Help from another person to put on and taking off regular upper body clothing?: A Lot Help from another person to put on and taking off regular lower body clothing?: Total 6 Click Score: 11    End of Session    OT Visit Diagnosis: Muscle weakness (generalized) (M62.81);Other abnormalities of gait and mobility (R26.89);Other symptoms and signs involving cognitive function   Activity Tolerance Patient tolerated treatment well;Patient limited by fatigue (Cues for breathing)   Patient Left in chair;with call bell/phone within reach;with chair alarm set   Nurse Communication  (Exercises performed)        Time: 1125-1207 OT Time Calculation (min): 42 min  Charges: OT General Charges $OT Visit: 1 Visit OT Treatments $Therapeutic Exercise: 38-52 mins  [name], [place] Office: [phone] 05/14/2020    [name] 05/14/2020, 1:37 PM

## 2020-05-15 DIAGNOSIS — N179 Acute kidney failure, unspecified: Secondary | ICD-10-CM

## 2020-05-15 LAB — GLUCOSE, CAPILLARY
Glucose-Capillary: 105 mg/dL — ABNORMAL HIGH (ref 70–99)
Glucose-Capillary: 120 mg/dL — ABNORMAL HIGH (ref 70–99)
Glucose-Capillary: 139 mg/dL — ABNORMAL HIGH (ref 70–99)
Glucose-Capillary: 190 mg/dL — ABNORMAL HIGH (ref 70–99)

## 2020-05-15 MED ORDER — GLUCERNA SHAKE PO LIQD
237.0000 mL | Freq: Three times a day (TID) | ORAL | Status: DC
  Administered 2020-05-15 – 2020-05-16 (×2): 237 mL via ORAL
  Filled 2020-05-15 (×6): qty 237

## 2020-05-15 NOTE — Progress Notes (Signed)
Occupational Therapy Treatment Patient Details Name: [name] MRN: [id number] DOB: 03/03/1966 Today's Date: 05/15/2020    History of present illness 54 yo female tested positive for COVID 19 on 8/09.  Had progressive symptoms and admitted to [facility] on 8/17.  Treated with remdesivir, prednisone and ABx for CAP.  She developed worsening hypoxia and required intubation 04/21/20 AND  was transferred TO [facility]  with ARDS. Extubated 05/04/20.  ZES:[id number] colitis, Carpal tunnel, Osteoarthritis, DM type 2, Asthma, HTN, HLD, OSA, Morbid obesity with BMI 49.64, Depression   OT comments  Treatment focused on improving upper body and core strength needed for functional mobility and ADLs. Patient tolerated 3 sets of chest presses and external rotation reps (over head) with light dowel, passing 500 ml bottle between hands, and pulling up with bilateral upper extremities into upright position from recliner position. Patient tolerated well with rest breaks. Patient continues to be motivated.   Follow Up Recommendations  SNF;CIR    Equipment Recommendations  None recommended by OT;Other (comment) (Defer to next venue)    Recommendations for Other Services      Precautions / Restrictions         Mobility Bed Mobility                  Transfers                      Balance                                           ADL either performed or assessed with clinical judgement   ADL                                               Vision       Perception     Praxis      Cognition                                                Exercises Other Exercises Other Exercises: Chest PRess with light down 10 reps x 3 sets fully supine Other Exercises: Shoulder External Rotation 10 reps x 3 sets with light dowel in supine Other Exercises: Passing 500 ml back and forth between hands, bring hand down to bed x 10 Other  Exercises: Pulling Into seated upright position with bilateral arms and maintaining postion x 10-15 seconds with therapist as counterweight x 4   Shoulder Instructions       General Comments      Pertinent Vitals/ Pain       Faces Pain Scale: Hurts a little bit Pain Location: pain Pain Descriptors / Indicators: Aching  Home Living                                          Prior Functioning/Environment              Frequency           Progress Toward Goals  OT Goals(current  goals can now be found in the care plan section)     Acute Rehab OT Goals Patient Stated Goal: To go to rehab for 20 days and then home. OT Goal Formulation: With patient Time For Goal Achievement: 05/17/20 Potential to Achieve Goals: Good  Plan      Co-evaluation          OT goals addressed during session: Strengthening/ROM      AM-PAC OT "6 Clicks" Daily Activity     Outcome Measure   Help from another person eating meals?: A Little Help from another person taking care of personal grooming?: A Lot Help from another person toileting, which includes using toliet, bedpan, or urinal?: Total Help from another person bathing (including washing, rinsing, drying)?: A Lot Help from another person to put on and taking off regular upper body clothing?: A Lot Help from another person to put on and taking off regular lower body clothing?: Total 6 Click Score: 11    End of Session    OT Visit Diagnosis: Muscle weakness (generalized) (M62.81);Other abnormalities of gait and mobility (R26.89);Other symptoms and signs involving cognitive function   Activity Tolerance Patient tolerated treatment well   Patient Left in bed;with call bell/phone within reach   Nurse Communication  (okay to see per RN)        Time: 9450-3888 OT Time Calculation (min): 26 min  Charges: OT General Charges $OT Visit: 1 Visit OT Treatments $Therapeutic Exercise: 23-37 mins  Xinyi Batton,  OTR/L [place]  Office [phone] Pager: [place] 05/15/2020, 5:05 PM

## 2020-05-15 NOTE — TOC Progression Note (Signed)
Transition of Care [facility]) - Progression Note    Patient Details  Name: [name] MRN: [id number] Date of Birth: 06-19-1966  Transition [facility] CM/SW Contact  Vickee Mormino, Marjie Skiff, RN Phone Number: 05/15/2020, 2:50 PM  Clinical Narrative:    This CM was alerted by [facility] that they were unsure if they could now accommodate pt due to her needing a bariatric bed. [facility] Hail to speak to her leadership and inquire about the availability of a bari bed at the facility.  Addendum:  This CM was alerted that pt would still be able to go to [facility] but they would need to wait until tomorrow to take her so they can get the bari bed situation completed. Pt can dc tomorrow morning. This CM alerted pt and called husband as well.   Expected Discharge Plan: [place] Barriers to Discharge: Continued Medical Work up  Expected Discharge Plan and Services Expected Discharge Plan: [place]   Discharge Planning Services: CM Consult   Living arrangements for the past 2 months: [place] Expected Discharge Date: 05/15/20                                     Social Determinants of Health (SDOH) Interventions    Readmission Risk Interventions Readmission Risk Prevention Plan 05/10/2020  Transportation Screening Complete  Medication Review Press photographer) Complete  PCP or Specialist appointment within 3-5 days of discharge Complete  Palliative Care Screening Not Applicable  Some recent data might be hidden

## 2020-05-15 NOTE — Progress Notes (Signed)
PROGRESS NOTE    [name]  PPJ:[id number] DOB: 02/27/1966 DOA: 04/20/2020 PCP: Caryl Bis, MD    No chief complaint on file.   Brief Narrative:  54 year old community dwelling white female severe obesity BMI >47 DM TY two HTN right hip repair 2013, carpal tunnel release 09/2019 with other multiple surgeries 10/2019 Crohn's colitis since 1998 followed by Dr. Melony Overly on Pentasa currently, irritable bowel syndrome  Tested positive Covid 04/08/2020 admitted [facility] 04/16/2020-intubated for VDRF ARDS-long hospital course until extubation 05/04/2020--completed remdesivir, steroids, cefepime  Not given Actemra secondary to Crohn's   Transferred to [facility] 05/06/2020 on 8 L nasal cannula Has core track in place but speech therapy felt she was at high risk for swallowing given dysphagia and prolonged intubation Rectal tube discontinued 9/10 for likely feed associated diarrhea but this has subsequently resolved to some degree with Loperamide  Requested to leave AMA because of PEG feeds 9/10 but fortunately elected to stay Been working with therapy diligently-CIR reconsulted given possible progression and more feasibility for stay    Assessment & Plan:   Principal Problem:   ARDS (adult respiratory distress syndrome) ([place]) Active Problems:   Hypertension complicating diabetes ([place])   Crohn disease ([place])   IBS (irritable bowel syndrome)   Physical deconditioning   Dysphagia   Pressure injury of sacral region, stage 1  #1 COVID-19 positivity with ARDS acute respiratory (VDRF 8/17-9/5) failure in recovery on room air Patient status post goal directed therapy for COVID-19 PCR were given other than baricitinib.  Patient was also on steroids and has been tapered down to home regimen of prednisone 20 mg daily.  Status post IV vancomycin IV cefepime which was completed on 05/08/2020.  Currently on room air with clinical improvement.  Outpatient follow-up.  2.  Severe  dysphagia Patient noted to have some severe dysphagia and seen by speech therapy.  MBS unfortunately cannot be done yet and patient currently tolerating current diet of dysphagia 2 with thin liquids.  Patient reassessed by speech and small bore feeding tube has been removed, per speech therapy patient's voice is stronger, improved swallowing and strength and as such diet has been advanced to a dysphagia 3 diet.  Will need speech to follow at facility.  3.  Severe debility and possible critical illness/steroid-induced myopathy/unsure if patient has bilateral foot drop. Patient noted with right upper extremity weakness which is improving and patient feeling stronger.  Patient unable to dorsiflex with lower extremity myopathy.  PT following.  Outpatient follow-up.  4.  Tube feeding associated diarrhea Resolved.  Currently on loperamide.  Follow.  5.  Acute kidney injury with hyponatremia Tube feeds/free water discontinued 05/12/2020.  Hyponatremia resolved.  Renal function improved.  IV fluids saline lock.  Follow.  6.  Diabetes mellitus type 2 Noted to have been exacerbated by steroids.  Continue linagliptin 5 mg, Levemir 15 units, sliding scale insulin.  Follow.  7.  Crohn's disease/IBS followed by Dr. Melony Overly previously on Pentasa Pentasa resumed.  Continue prednisone 20 mg daily.  Outpatient follow-up with GI.  8.  Hypertension Stable.  9.  Sinus tachycardia Continue metoprolol 50 mg every 8 hours, Tiazac 360 mg daily.  Follow.   DVT prophylaxis: Lovenox Code Status: Full Family Communication: Updated patient.  No family at bedside. Disposition:   Status is: Inpatient    Dispo:  Patient From: Home  Planned Disposition: [place]  Expected discharge date: 05/15/20  Medically stable for discharge: Yes awaiting placement.  Consultants:   None  Procedures:  Chest x-ray 05/05/2020, 05/03/2020, 05/02/2020, 05/01/2020, 04/28/2020, 04/27/2020, 04/26/2020,, 04/25/2020,  04/24/2020, 04/23/2020, 04/22/2020, 04/21/2020, 04/20/2020  2D echo 04/24/2020  Antimicrobials:  Anti-infectives (From admission, onward)   Start     Dose/Rate Route Frequency Ordered Stop   04/29/20 0300  vancomycin (VANCOREADY) IVPB 1250 mg/250 mL  Status:  Discontinued        1,250 mg 166.7 mL/hr over 90 Minutes Intravenous Every 12 hours 04/28/20 1351 05/01/20 1232   04/28/20 1430  vancomycin (VANCOREADY) IVPB 2000 mg/400 mL        2,000 mg 200 mL/hr over 120 Minutes Intravenous  Once 04/28/20 1351 04/28/20 1913   04/28/20 1400  ceFEPIme (MAXIPIME) 2 g in sodium chloride 0.9 % 100 mL IVPB        2 g 200 mL/hr over 30 Minutes Intravenous Every 8 hours 04/28/20 1316 05/07/20 2225   04/22/20 1000  remdesivir 100 mg in sodium chloride 0.9 % 100 mL IVPB  Status:  Discontinued       "Followed by" Linked Group Details   100 mg 200 mL/hr over 30 Minutes Intravenous Daily 04/21/20 0155 04/22/20 0911   04/21/20 0230  remdesivir 200 mg in sodium chloride 0.9% 250 mL IVPB       "Followed by" Linked Group Details   200 mg 580 mL/hr over 30 Minutes Intravenous Once 04/21/20 0155 04/21/20 0400        Subjective: Patient sitting up in bed.  Feels shortness of breath has improved daily.  Denies any chest pain.  No significant change from chronic abdominal discomfort from her Crohn's. Tolerating current diet.  Awaiting placement.  Objective: Vitals:   05/14/20 1349 05/14/20 2014 05/15/20 0251 05/15/20 0507  BP: 132/69 (!) 128/58 (!) 155/81 (!) 151/68  Pulse: 82 77 79 77  Resp: 16 20  18   Temp: 100.2 F (37.9 C) 98.5 F (36.9 C)    TempSrc: Oral Oral    SpO2: 94% 92% 95% 93%  Weight:      Height:        Intake/Output Summary (Last 24 hours) at 05/15/2020 1127 Last data filed at 05/15/2020 1015 Gross per 24 hour  Intake 1195 ml  Output 600 ml  Net 595 ml   Filed Weights   05/08/20 0500 05/09/20 0500 05/13/20 0643  Weight: 121.3 kg 121.3 kg (!) 162.4 kg    Examination:  General  exam: Appears calm and comfortable  Respiratory system: Clear to auscultation.  No wheezes, no crackles, no rhonchi.  Respiratory effort normal. Cardiovascular system: S1 & S2 heard, RRR. No JVD, murmurs, rubs, gallops or clicks. No pedal edema. Gastrointestinal system: Abdomen is nondistended, soft and nontender. No organomegaly or masses felt. Normal bowel sounds heard. Central nervous system: Alert and oriented. No focal neurological deficits. Extremities: Symmetric 5 x 5 power. Skin: No rashes, lesions or ulcers Psychiatry: Judgement and insight appear normal. Mood & affect appropriate.     Data Reviewed: I have personally reviewed following labs and imaging studies  CBC: Recent Labs  Lab 05/11/20 0553 05/12/20 0500 05/13/20 0516 05/14/20 0626  WBC 9.6 9.1 6.1 6.5  NEUTROABS  --  7.0 4.1 4.9  HGB 10.5* 10.7* 10.1* 10.0*  HCT 32.6* 32.4* 31.2* 30.4*  MCV 102.5* 102.2* 102.6* 102.4*  PLT 131* 133* 138* 142*    Basic Metabolic Panel: Recent Labs  Lab 05/10/20 0636 05/11/20 0553 05/12/20 0500 05/13/20 0516 05/14/20 0626  NA 141 144 141 141 142  K 4.1  3.9 3.6 3.6 3.7  CL 106 109 107 107 107  CO2 22 22 23 25 24   GLUCOSE 170* 141* 111* 99 112*  BUN 36* 33* 23* 23* 25*  CREATININE 1.36* 0.75 0.69 0.81 0.96  CALCIUM 8.0* 8.5* 8.3* 8.3* 8.6*    GFR: Estimated Creatinine Clearance: 103.1 mL/min (by C-G formula based on SCr of 0.96 mg/dL).  Liver Function Tests: Recent Labs  Lab 05/12/20 0500 05/13/20 0516 05/14/20 0626  AST 18 22 57*  ALT 49* 47* 86*  ALKPHOS 69 92 266*  BILITOT 1.0 0.8 0.9  PROT 5.9* 5.6* 5.6*  ALBUMIN 2.6* 2.5* 2.5*    CBG: Recent Labs  Lab 05/14/20 1126 05/14/20 1738 05/14/20 2220 05/15/20 0026 05/15/20 0506  GLUCAP 132* 151* 131* 120* 105*     No results found for this or any previous visit (from the past 240 hour(s)).       Radiology Studies: No results found.      Scheduled Meds: . calcium-vitamin D  1 tablet  Oral BID  . chlorhexidine  15 mL Mouth Rinse BID  . Chlorhexidine Gluconate Cloth  6 each Topical Daily  . diltiazem  360 mg Oral Daily  . DULoxetine  60 mg Oral Daily  . enoxaparin (LOVENOX) injection  60 mg Subcutaneous Q24H  . feeding supplement (GLUCERNA SHAKE)  237 mL Oral TID BM  . insulin aspart  0-15 Units Subcutaneous Q6H  . insulin detemir  15 Units Subcutaneous QHS  . linagliptin  5 mg Per Tube Daily  . mouth rinse  15 mL Mouth Rinse q12n4p  . mesalamine  1,000 mg Oral QID  . metoprolol tartrate  50 mg Per Tube Q8H  . pneumococcal 23 valent vaccine  0.5 mL Intramuscular Tomorrow-1000  . predniSONE  20 mg Per NG tube Daily  . sodium chloride flush  3 mL Intravenous Q12H   Continuous Infusions:   LOS: 25 days    Time spent: 35 minutes    Irine Seal, MD [facility]   To contact the attending provider between 7A-7P or the covering provider during after hours 7P-7A, please log into the web site [email or web address] and access using universal [place] password for that web site. If you do not have the password, please call the hospital operator.  05/15/2020, 11:27 AM

## 2020-05-15 NOTE — Progress Notes (Signed)
  Speech Language Pathology Treatment: Dysphagia  Patient Details Name: [name] MRN: [id number] DOB: 06/15/1966 Today's Date: 05/15/2020 Time: 3374-4514 SLP Time Calculation (min) (ACUTE ONLY): 47 min  Assessment / Plan / Recommendation Clinical Impression  Pt seen for skilled SLP to assure po tolerance with dietary advancement, determine indication for modification of [employer] to maximize swallow rehab.  Pt's voice perceptually appear weaker than on the previous date and pt concurs. She contributes vocal weakness to xerostomia and dry pharynx from use of CPap.  Moderate verbal cues initially to conduct RMST with maximum effort fading to Mod I.  Pt reports work effort to be level 6 overall and thus increased number of repetitions to 15 to increase endurance. Pt reports her intake is better than when initially transitioned to po diet.  SLP also advised pt to record herself on her phone every few days repeating same sentences to monitor for improvement/regression.  Pt to dc today per notes, follow up at SNF advised to maximize voice/swallow rehab.  Pt has made marked improvement during hospital coarse. Thanks for allowing me to help care for this pt.    HPI HPI: 54 yo female adm to [facility] on 8/21 from [place] after admitted there on 8/17 - after having COVID with respiratory failure.  Pt treated with remedsivir, prednisone, ABX for CAP.  Pt found to have ARDS, pneumonia, VDRF- intubated 8/21-9/4.  Pt was started on a diet over the weekend of dys2/thin with small bore feeding tube removed.  She has continued to make progress with stronger voice, improved swallowing/strength.  Today pt seen to assess po tolerance, determine readiness for dietary advancement and indication for instrumental evaluation *flouro machine remains down.      SLP Plan  Continue with current plan of care (defer MBS due to pt's clinical progress)       Recommendations  Diet recommendations:  Dysphagia 3 (mechanical soft);Thin liquid Liquids provided via: Straw (given small, single sips) Medication Administration: Other (Comment) (with pudding) Supervision: Trained caregiver to feed patient;Staff to assist with self feeding Compensations: Slow rate;Small sips/bites Postural Changes and/or Swallow Maneuvers: Seated upright 90 degrees;Upright 30-60 min after meal                Oral Care Recommendations: Oral care QID;Oral care before and after PO Follow up Recommendations: Skilled Nursing facility SLP Visit Diagnosis: Dysphagia, oropharyngeal phase (R13.12) Plan: Continue with current plan of care (defer MBS due to pt's clinical progress)       GO                Macario Golds 05/15/2020, 1:38 PM  Kathleen Lime, MS [place] Office [phone]

## 2020-05-16 LAB — GLUCOSE, CAPILLARY
Glucose-Capillary: 109 mg/dL — ABNORMAL HIGH (ref 70–99)
Glucose-Capillary: 116 mg/dL — ABNORMAL HIGH (ref 70–99)
Glucose-Capillary: 92 mg/dL (ref 70–99)
Glucose-Capillary: 136 mg/dL — ABNORMAL HIGH (ref 70–99)
Glucose-Capillary: 129 mg/dL — ABNORMAL HIGH (ref 70–99)

## 2020-05-16 LAB — BASIC METABOLIC PANEL
Anion gap: 9 (ref 5–15)
BUN: 16 mg/dL (ref 6–20)
CO2: 24 mmol/L (ref 22–32)
Calcium: 9 mg/dL (ref 8.9–10.3)
Chloride: 107 mmol/L (ref 98–111)
Creatinine, Ser: 0.43 mg/dL — ABNORMAL LOW (ref 0.44–1.00)
GFR calc Af Amer: >60 mL/min (ref >60)
GFR calc non Af Amer: >60 mL/min (ref >60)
Glucose, Bld: 112 mg/dL — ABNORMAL HIGH (ref 70–99)
Potassium: 3.4 mmol/L — ABNORMAL LOW (ref 3.5–5.1)
Sodium: 140 mmol/L (ref 135–145)

## 2020-05-16 MED ORDER — DICYCLOMINE HCL 20 MG PO TABS: 20.0000 mg | ORAL_TABLET | Freq: Three times a day (TID) | ORAL | Status: DC | PRN

## 2020-05-16 MED ORDER — FAMOTIDINE 20 MG PO TABS
20.0000 mg | ORAL_TABLET | Freq: Two times a day (BID) | ORAL | Status: DC
  Administered 2020-05-16: 20 mg via ORAL
  Filled 2020-05-16: qty 1

## 2020-05-16 MED ORDER — FLUTICASONE FUROATE-VILANTEROL 200-25 MCG/INH IN AEPB
1.0000 | INHALATION_SPRAY | Freq: Every day | RESPIRATORY_TRACT | Status: DC
  Filled 2020-05-16: qty 28

## 2020-05-16 MED ORDER — PRAVASTATIN SODIUM 20 MG PO TABS: 20.0000 mg | ORAL_TABLET | Freq: Every day | ORAL | Status: DC

## 2020-05-16 MED ORDER — PREDNISONE 20 MG PO TABS: 20.0000 mg | ORAL_TABLET | Freq: Every day | ORAL | Status: DC

## 2020-05-16 MED ORDER — POTASSIUM CHLORIDE 20 MEQ PO PACK
40.0000 meq | PACK | Freq: Once | ORAL | Status: AC
  Administered 2020-05-16: 40 meq via ORAL
  Filled 2020-05-16: qty 2

## 2020-05-16 MED ORDER — LINAGLIPTIN 5 MG PO TABS
5.0000 mg | ORAL_TABLET | Freq: Every day | ORAL | Status: DC
  Administered 2020-05-16: 5 mg via ORAL
  Filled 2020-05-16: qty 1

## 2020-05-16 MED ORDER — PREDNISONE 20 MG PO TABS
20.0000 mg | ORAL_TABLET | Freq: Every day | ORAL | Status: DC
  Administered 2020-05-16: 20 mg via ORAL
  Filled 2020-05-16: qty 1

## 2020-05-16 MED ORDER — LORATADINE 10 MG PO TABS
10.0000 mg | ORAL_TABLET | Freq: Every day | ORAL | Status: DC
  Administered 2020-05-16: 10 mg via ORAL
  Filled 2020-05-16: qty 1

## 2020-05-16 MED ORDER — ALBUTEROL SULFATE HFA 108 (90 BASE) MCG/ACT IN AERS
2.0000 | INHALATION_SPRAY | RESPIRATORY_TRACT | Status: DC | PRN
  Filled 2020-05-16: qty 6.7

## 2020-05-16 NOTE — TOC Transition Note (Signed)
Transition of Care [facility]) - CM/SW Discharge Note   Patient Details  Name: [name] MRN: [id number] Date of Birth: March 19, 1966  [facility]) CM/SW Contact:  Lynnell Catalan, RN Phone Number: 05/16/2020, 1:41 PM   Clinical Narrative:    Pt to dc to [place] today. [facility] to be arranged for transport. RN to call report to [phone]   Final next level of care: [place] Barriers to Discharge: Continued Medical Work up   Patient Goals and CMS Choice Patient states their goals for this hospitalization and ongoing recovery are:: unable to state due to vent      Discharge Placement              Patient chooses bed at: [facility] Patient to be transferred to facility by: [facility]      Discharge Plan and Services   Discharge Planning Services: CM Consult                                 Social Determinants of Health (SDOH) Interventions     Readmission Risk Interventions Readmission Risk Prevention Plan 05/10/2020  Transportation Screening Complete  Medication Review (RN Care Manager) Complete  PCP or Specialist appointment within 3-5 days of discharge Complete  Palliative Care Screening Not Applicable  Some recent data might be hidden

## 2020-05-16 NOTE — Progress Notes (Signed)
RA TL PICC removed per protocol for pt's discharge. Manual pressure applied for 5 mins. No bleeding or swelling noted. Instructed patient to remain in bed for thirty mins. Educated patient about S/S of infection and when to call MD; no heavy lifting or pressure on right side until tomorrow at 1600; keep dressing dry and intact until 1600 tomorrow. Pt verbalized comprehension.

## 2020-05-16 NOTE — Progress Notes (Signed)
PROGRESS NOTE    [name]  YWV:[id number] DOB: 1966-03-14 DOA: 04/20/2020 PCP: Caryl Bis, MD    No chief complaint on file.   Brief Narrative:  54 year old community dwelling white female severe obesity BMI >47 DM TY two HTN right hip repair 2013, carpal tunnel release 09/2019 with other multiple surgeries 10/2019 Crohn's colitis since 1998 followed by Dr. Melony Overly on Pentasa currently, irritable bowel syndrome  Tested positive Covid 04/08/2020 admitted [facility] 04/16/2020-intubated for VDRF ARDS-long hospital course until extubation 05/04/2020--completed remdesivir, steroids, cefepime  Not given Actemra secondary to Crohn's   Transferred to [facility] 05/06/2020 on 8 L nasal cannula Has core track in place but speech therapy felt she was at high risk for swallowing given dysphagia and prolonged intubation Rectal tube discontinued 9/10 for likely feed associated diarrhea but this has subsequently resolved to some degree with Loperamide  Requested to leave AMA because of PEG feeds 9/10 but fortunately elected to stay Been working with therapy diligently-CIR reconsulted given possible progression and more feasibility for stay    Assessment & Plan:   Principal Problem:   ARDS (adult respiratory distress syndrome) ([place]) Active Problems:   Hypertension complicating diabetes ([place])   Crohn disease ([place])   IBS (irritable bowel syndrome)   Physical deconditioning   Dysphagia   Pressure injury of sacral region, stage 1   AKI (acute kidney injury) ([place])  1 COVID-19 positivity with ARDS acute respiratory (VDRF 8/17-9/5) failure in recovery on room air Patient status post goal directed therapy for COVID-19 PCR were given other than baricitinib.  Patient was also on steroids and has been tapered down to home regimen of prednisone 20 mg daily.  Status post IV vancomycin IV cefepime which was completed on 05/08/2020.  Patient with sats of 96% on room air.  Improved  clinically.  Outpatient follow-up.   2.  Severe dysphagia Patient noted to have some severe dysphagia and seen by speech therapy.  MBS unfortunately cannot be done yet and patient currently tolerating current diet of dysphagia 2 with thin liquids.  Patient reassessed by speech and small bore feeding tube has been removed, per speech therapy patient's voice is stronger, improved swallowing and strength and as such diet has been advanced to a dysphagia 3 diet.  Outpatient follow-up with speech therapy at facility.   3.  Severe debility and possible critical illness/steroid-induced myopathy/unsure if patient has bilateral foot drop. Patient noted with right upper extremity weakness which is improving and patient feeling stronger.  Patient unable to dorsiflex with lower extremity myopathy.  PT following.  Outpatient follow-up.  4.  Tube feeding associated diarrhea Resolved.  Currently on loperamide.  Follow.  5.  Acute kidney injury with hyponatremia Tube feeds/free water discontinued 05/12/2020.  Hyponatremia resolved.  Renal function improved.  IV fluids saline lock.  Follow.  6.  Diabetes mellitus type 2 Noted to have been exacerbated by steroids.  Continue linagliptin 5 mg, Levemir 15 units, sliding scale insulin.  Outpatient follow-up.  Follow.  7.  Crohn's disease/IBS followed by Dr. Melony Overly previously on Pentasa Continue Pentasa.  Continue prednisone.  Outpatient follow-up with GI.   8.  Hypertension Stable.  Continue metoprolol and Tiazac.  9.  Sinus tachycardia Improved on current regimen of metoprolol 50 mg p.o. every 8 hours, Tiazac 360 mg daily.  FolloW.   DVT prophylaxis: Lovenox Code Status: Full Family Communication: Updated patient.  No family at bedside. Disposition:   Status is: Inpatient    Dispo:  Patient  From: Home  Planned Disposition: [place]  Expected discharge date: Today  Medically stable for discharge: Yes awaiting placement.          Consultants:   None  Procedures:  Chest x-ray 05/05/2020, 05/03/2020, 05/02/2020, 05/01/2020, 04/28/2020, 04/27/2020, 04/26/2020,, 04/25/2020, 04/24/2020, 04/23/2020, 04/22/2020, 04/21/2020, 04/20/2020  2D echo 04/24/2020  Antimicrobials:  Anti-infectives (From admission, onward)   Start     Dose/Rate Route Frequency Ordered Stop   04/29/20 0300  vancomycin (VANCOREADY) IVPB 1250 mg/250 mL  Status:  Discontinued        1,250 mg 166.7 mL/hr over 90 Minutes Intravenous Every 12 hours 04/28/20 1351 05/01/20 1232   04/28/20 1430  vancomycin (VANCOREADY) IVPB 2000 mg/400 mL        2,000 mg 200 mL/hr over 120 Minutes Intravenous  Once 04/28/20 1351 04/28/20 1913   04/28/20 1400  ceFEPIme (MAXIPIME) 2 g in sodium chloride 0.9 % 100 mL IVPB        2 g 200 mL/hr over 30 Minutes Intravenous Every 8 hours 04/28/20 1316 05/07/20 2225   04/22/20 1000  remdesivir 100 mg in sodium chloride 0.9 % 100 mL IVPB  Status:  Discontinued       "Followed by" Linked Group Details   100 mg 200 mL/hr over 30 Minutes Intravenous Daily 04/21/20 0155 04/22/20 0911   04/21/20 0230  remdesivir 200 mg in sodium chloride 0.9% 250 mL IVPB       "Followed by" Linked Group Details   200 mg 580 mL/hr over 30 Minutes Intravenous Once 04/21/20 0155 04/21/20 0400       Subjective: Patient sitting up in bed.  States she feels much better today.  States abdominal discomfort has improved after stopping Glucerna.  Tolerating current diet.  No chest pain.   Objective: Vitals:   05/15/20 1345 05/15/20 1950 05/16/20 0009 05/16/20 0545  BP: (!) 157/76 (!) 146/66 (!) 153/73 (!) 149/61  Pulse: 77 71 71 71  Resp: 14 18  18   Temp: 98.9 F (37.2 C) 98.3 F (36.8 C)  98.7 F (37.1 C)  TempSrc: Oral Oral  Oral  SpO2: 95% 94%  96%  Weight:      Height:        Intake/Output Summary (Last 24 hours) at 05/16/2020 1144 Last data filed at 05/16/2020 0545 Gross per 24 hour  Intake 240 ml  Output 1350 ml  Net -1110 ml   Filed Weights    05/08/20 0500 05/09/20 0500 05/13/20 0643  Weight: 121.3 kg 121.3 kg (!) 162.4 kg    Examination:  General exam: NAD Respiratory system: CTAB.  No wheezes, no crackles, no rhonchi.  Normal respiratory effort.  Speaking in full sentences.  Cardiovascular system: Regular rate and rhythm no murmurs rubs or gallops.  No JVD.  No lower extremity edema.  Gastrointestinal system: Abdomen is soft, nontender, nondistended, positive bowel sounds.  No rebound.  No guarding.  Central nervous system: Alert and oriented. No focal neurological deficits. Extremities: Symmetric 5 x 5 power. Skin: No rashes, lesions or ulcers Psychiatry: Judgement and insight appear normal. Mood & affect appropriate.     Data Reviewed: I have personally reviewed following labs and imaging studies  CBC: Recent Labs  Lab 05/11/20 0553 05/12/20 0500 05/13/20 0516 05/14/20 0626  WBC 9.6 9.1 6.1 6.5  NEUTROABS  --  7.0 4.1 4.9  HGB 10.5* 10.7* 10.1* 10.0*  HCT 32.6* 32.4* 31.2* 30.4*  MCV 102.5* 102.2* 102.6* 102.4*  PLT 131* 133* 138* 142*  Basic Metabolic Panel: Recent Labs  Lab 05/11/20 0553 05/12/20 0500 05/13/20 0516 05/14/20 0626 05/16/20 0819  NA 144 141 141 142 140  K 3.9 3.6 3.6 3.7 3.4*  CL 109 107 107 107 107  CO2 22 23 25 24 24   GLUCOSE 141* 111* 99 112* 112*  BUN 33* 23* 23* 25* 16  CREATININE 0.75 0.69 0.81 0.96 0.43*  CALCIUM 8.5* 8.3* 8.3* 8.6* 9.0    GFR: Estimated Creatinine Clearance: 123.8 mL/min (A) (by C-G formula based on SCr of 0.43 mg/dL (L)).  Liver Function Tests: Recent Labs  Lab 05/12/20 0500 05/13/20 0516 05/14/20 0626  AST 18 22 57*  ALT 49* 47* 86*  ALKPHOS 69 92 266*  BILITOT 1.0 0.8 0.9  PROT 5.9* 5.6* 5.6*  ALBUMIN 2.6* 2.5* 2.5*    CBG: Recent Labs  Lab 05/15/20 1202 05/15/20 1757 05/15/20 2221 05/16/20 0011 05/16/20 0541  GLUCAP 190* 139* 116* 109* 92     No results found for this or any previous visit (from the past 240 hour(s)).        Radiology Studies: No results found.      Scheduled Meds: . calcium-vitamin D  1 tablet Oral BID  . chlorhexidine  15 mL Mouth Rinse BID  . Chlorhexidine Gluconate Cloth  6 each Topical Daily  . diltiazem  360 mg Oral Daily  . DULoxetine  60 mg Oral Daily  . enoxaparin (LOVENOX) injection  60 mg Subcutaneous Q24H  . famotidine  20 mg Oral BID  . feeding supplement (GLUCERNA SHAKE)  237 mL Oral TID BM  . fluticasone furoate-vilanterol  1 puff Inhalation Daily  . insulin aspart  0-15 Units Subcutaneous Q6H  . insulin detemir  15 Units Subcutaneous QHS  . linagliptin  5 mg Oral Daily  . loratadine  10 mg Oral Daily  . mouth rinse  15 mL Mouth Rinse q12n4p  . mesalamine  1,000 mg Oral QID  . metoprolol tartrate  50 mg Per Tube Q8H  . pneumococcal 23 valent vaccine  0.5 mL Intramuscular Tomorrow-1000  . predniSONE  20 mg Oral Daily  . sodium chloride flush  3 mL Intravenous Q12H   Continuous Infusions:   LOS: 26 days    Time spent: 35 minutes    Irine Seal, MD [facility]   To contact the attending provider between 7A-7P or the covering provider during after hours 7P-7A, please log into the web site [email or web address] and access using universal [place] password for that web site. If you do not have the password, please call the hospital operator.  05/16/2020, 11:44 AM

## 2020-05-16 NOTE — Discharge Summary (Signed)
Physician Discharge Summary  [name] IRW:[id number] DOB: 04/19/1966 DOA: 04/20/2020  PCP: Caryl Bis, MD  Admit date: 04/20/2020 Discharge date: 05/16/2020  Time spent: 25 minutes  Recommendations for Outpatient Follow-up:  1. For follow-up recommendations please see prior discharge summary dictated by Dr. Verlon Au 05/14/2020   Discharge Diagnoses:  Principal Problem:   ARDS (adult respiratory distress syndrome) ([place]) Active Problems:   Hypertension complicating diabetes ([place])   Crohn disease ([place])   IBS (irritable bowel syndrome)   Physical deconditioning   Dysphagia   Pressure injury of sacral region, stage 1   AKI (acute kidney injury) ([place])   Discharge Condition: Stable and improved  Diet recommendation: Carb modified/heart healthy  Filed Weights   05/08/20 0500 05/09/20 0500 05/13/20 0643  Weight: 121.3 kg 121.3 kg (!) 162.4 kg    History of present illness:  54 year old community dwelling white female severe obesity BMI >47 DM TY two HTN right hip repair 2013, carpal tunnel release 09/2019 with other multiple surgeries 10/2019 Crohn's colitis since 1998 followed by Dr. Melony Overly on Pentasa currently, irritable bowel syndrome  Tested positive Covid 04/08/2020 admitted [facility] 04/16/2020-intubated for VDRF ARDS-long hospital course until extubation 05/04/2020--completed remdesivir, steroids, cefepime  Not given Actemra secondary to Crohn's   Transferred to [facility] 05/06/2020 on 8 L nasal cannula Has core track in place but speech therapy felt she was at high risk for swallowing given dysphagia and prolonged intubation Rectal tube discontinued 9/10 for likely feed associated diarrhea but this has subsequently resolved to some degree with Loperamide  Requested to leave AMA because of PEG feeds 9/10 but fortunately elected to stay Been working with therapy diligently-CIR reconsulted given possible progression and more feasibility for stay  Hospital  Course:  For hospital course please see prior discharge summary dictated by Dr. Verlon Au 05/14/2020  Procedures: Chest x-ray 05/05/2020, 05/03/2020, 05/02/2020, 05/01/2020, 04/28/2020, 04/27/2020, 04/26/2020,, 04/25/2020, 04/24/2020, 04/23/2020, 04/22/2020, 04/21/2020, 04/20/2020  2D echo 04/24/2020  Consultations:  None  Discharge Exam: Vitals:   05/16/20 0009 05/16/20 0545  BP: (!) 153/73 (!) 149/61  Pulse: 71 71  Resp:  18  Temp:  98.7 F (37.1 C)  SpO2:  96%    General: NAD Cardiovascular: RRR Respiratory: CTAB Anterior lung fields.  Discharge Instructions   Discharge Instructions    Diet - low sodium heart healthy   Complete by: As directed    Diet Carb Modified   Complete by: As directed    Discharge wound care:   Complete by: As directed    As above   Increase activity slowly   Complete by: As directed    No wound care   Complete by: As directed      Allergies as of 05/16/2020      Reactions   Remicade [infliximab] Swelling   Joints locked up   Cefprozil Rash   Penicillins Rash   Did it involve swelling of the face/tongue/throat, SOB, or low BP? No Did it involve sudden or severe rash/hives, skin peeling, or any reaction on the inside of your mouth or nose? No Did you need to seek medical attention at a hospital or doctor's office? No When did it last happen? If all above answers are "NO", may proceed with cephalosporin use.      Medication List    STOP taking these medications   azithromycin 500 MG tablet Commonly known as: ZITHROMAX   CALCIUM 600 + D PO   enoxaparin 40 MG/0.4ML injection Commonly known as: LOVENOX  lisinopril-hydrochlorothiazide 20-12.5 MG tablet Commonly known as: ZESTORETIC   LORazepam 2 MG/ML injection Commonly known as: ATIVAN   Magnesium 400 MG Tabs   mercaptopurine 50 MG tablet Commonly known as: PURINETHOL   spironolactone 25 MG tablet Commonly known as: ALDACTONE     TAKE these medications   acetaminophen 650 MG  CR tablet Commonly known as: TYLENOL Take 650 mg by mouth every 4 (four) hours as needed for pain.   albuterol 108 (90 Base) MCG/ACT inhaler Commonly known as: VENTOLIN HFA Inhale 2 puffs into the lungs every 4 (four) hours as needed for wheezing or shortness of breath.   albuterol 90 MCG/ACT inhaler Commonly known as: PROVENTIL,VENTOLIN Inhale 2 puffs into the lungs every 6 (six) hours as needed for wheezing or shortness of breath.   cetirizine 10 MG tablet Commonly known as: ZYRTEC Take 10 mg by mouth every evening.   cholecalciferol 25 MCG (1000 UNIT) tablet Commonly known as: VITAMIN D3 Take 1 tablet (1,000 Units total) by mouth daily.   dicyclomine 20 MG tablet Commonly known as: BENTYL TAKE ONE TABLET BY MOUTH THREE TIMES DAILY AS NEEDED FOR SPASMS What changed: See the new instructions.   diltiazem 360 MG 24 hr capsule Commonly known as: TIAZAC Take 360 mg by mouth daily.   DULoxetine 60 MG capsule Commonly known as: CYMBALTA Take 1 capsule (60 mg total) by mouth daily.   famotidine 20 MG tablet Commonly known as: PEPCID Take 20 mg by mouth 2 (two) times daily.   Fluticasone-Salmeterol 250-50 MCG/DOSE Aepb Commonly known as: ADVAIR Inhale 1 puff into the lungs every morning.   gabapentin 300 MG capsule Commonly known as: NEURONTIN Take 300 mg by mouth 2 (two) times daily.   hydrOXYzine 10 MG tablet Commonly known as: ATARAX/VISTARIL Take 2 tablets (20 mg total) by mouth 3 (three) times daily as needed for anxiety.   insulin lispro 100 UNIT/ML injection Commonly known as: HUMALOG Inject 0-15 Units into the skin See admin instructions. 0-15 units three times daily with meals And 2-5 units nightly   Jardiance 25 MG Tabs tablet Generic drug: empagliflozin Take 25 mg by mouth daily.   loperamide 2 MG capsule Commonly known as: IMODIUM Take 2 mg by mouth as needed for diarrhea or loose stools.   mesalamine 500 MG CR capsule Commonly known as:  Pentasa TAKE TWO (2) CAPSULES BY MOUTH FOUR TIMES DAILY   metoprolol tartrate 50 MG tablet Commonly known as: LOPRESSOR Place 1 tablet (50 mg total) into feeding tube every 8 (eight) hours.   multivitamin with minerals Tabs tablet Take 1 tablet by mouth every morning.   OMEGA-3 EPA FISH OIL PO Take by mouth. 1,000 1 cap once a day   pravastatin 20 MG tablet Commonly known as: PRAVACHOL Take 20 mg by mouth daily.   predniSONE 20 MG tablet Commonly known as: DELTASONE Take 1 tablet (20 mg total) by mouth daily. What changed: how much to take   traZODone 50 MG tablet Commonly known as: DESYREL Take 1 tablet (50 mg total) by mouth at bedtime as needed for sleep. What changed:   how much to take  when to take this  reasons to take this  Another medication with the same name was removed. Continue taking this medication, and follow the directions you see here.            Durable Medical Equipment  (From admission, onward)         Start     Ordered  05/12/20 0931  For home use only DME Specialty mattress  Once        05/12/20 0931           Discharge Care Instructions  (From admission, onward)         Start     Ordered   05/16/20 0000  Discharge wound care:       Comments: As above   05/16/20 1324         Allergies  Allergen Reactions  . Remicade [Infliximab] Swelling    Joints locked up  . Cefprozil Rash  . Penicillins Rash    Did it involve swelling of the face/tongue/throat, SOB, or low BP? No Did it involve sudden or severe rash/hives, skin peeling, or any reaction on the inside of your mouth or nose? No Did you need to seek medical attention at a hospital or doctor's office? No When did it last happen? If all above answers are "NO", may proceed with cephalosporin use.     Contact information for after-discharge care    [facility] .   Service: Skilled Nursing Contact information: [address] [place] [phone]                   The results of significant diagnostics from this hospitalization (including imaging, microbiology, ancillary and laboratory) are listed below for reference.    Significant Diagnostic Studies: DG Abd 1 View  Result Date: 04/27/2020 CLINICAL DATA:  Evaluate feeding tube EXAM: ABDOMEN - 1 VIEW COMPARISON:  April 23, 2020 FINDINGS: The distal tip of the feeding tube is in the distal stomach. IMPRESSION: The distal tip of the feeding tube is in the distal stomach. Recommend advancement into the distal duodenum before use. Electronically Signed   By: Dorise Bullion III M.D   On: 04/27/2020 17:14   DG Abd 1 View  Result Date: 04/23/2020 CLINICAL DATA:  Encounter for NG tube placement. EXAM: ABDOMEN - 1 VIEW COMPARISON:  Earlier today. FINDINGS: The weighted enteric tube tip is in the right upper quadrant, in the region of the distal stomach/first portion of the duodenum. Nonobstructive bowel gas pattern. Surgical clips in the right upper quadrant from cholecystectomy. IMPRESSION: Tip of the weighted enteric tube in the distal stomach/first portion of the duodenum. Electronically Signed   By: Keith Rake M.D.   On: 04/23/2020 17:17   DG Abd 1 View  Result Date: 04/23/2020 CLINICAL DATA:  Feeding tube placement EXAM: ABDOMEN - 1 VIEW COMPARISON:  04/22/2019 FINDINGS: Feeding tube with the tip projecting over the stomach. No bowel dilatation to suggest obstruction. No evidence of pneumoperitoneum, portal venous gas or pneumatosis. No pathologic calcifications along the expected course of the ureters. No acute osseous abnormality. IMPRESSION: Feeding tube with the tip projecting over the stomach. Electronically Signed   By: Kathreen Devoid   On: 04/23/2020 14:56   DG Abd 1 View  Result Date: 04/21/2020 CLINICAL DATA:  NG tube placement EXAM: ABDOMEN - 1 VIEW COMPARISON:  None. FINDINGS: NG tube tip and side port project  within the stomach. Small amount of bowel gas generally. IMPRESSION: NG tube tip and side port in the stomach. Electronically Signed   By: Ulyses Jarred M.D.   On: 04/21/2020 00:39   DG Chest Port 1 View  Result Date: 05/05/2020 CLINICAL DATA:  Respiratory failure, COVID EXAM: PORTABLE CHEST 1 VIEW COMPARISON:  05/03/2020 FINDINGS: Multifocal patchy opacities, right lower  lobe predominant. No pleural effusion or pneumothorax. Interval extubation. The heart is normal in size. Enteric tube courses stomach. IMPRESSION: Interval extubation. Multifocal pneumonia in this patient with known COVID. Electronically Signed   By: Julian Hy M.D.   On: 05/05/2020 04:40   DG CHEST PORT 1 VIEW  Result Date: 05/03/2020 CLINICAL DATA:  COVID positive.  Respiratory failure. EXAM: PORTABLE CHEST 1 VIEW COMPARISON:  05/02/2020 FINDINGS: The endotracheal tube tip is stable above the carina. There is a feeding tube with tip below the field of view. Right upper extremity PICC line tip projects over the cavoatrial junction. Mild hazy interstitial opacities within the left lung and right base are again noted. When compared with the previous exam there is been re-expansion of the right lower lobe. IMPRESSION: 1. Interval re-expansion of the right lower lobe. 2. Stable support apparatus. 3. Persistent mild hazy interstitial opacities. Electronically Signed   By: Kerby Moors M.D.   On: 05/03/2020 08:28   DG CHEST PORT 1 VIEW  Result Date: 05/02/2020 CLINICAL DATA:  Shortness of breath.  COVID positive EXAM: PORTABLE CHEST 1 VIEW COMPARISON:  Yesterday FINDINGS: Endotracheal tube tip is at the clavicular heads. The feeding tube at least reaches the stomach. Right PICC with tip at the upper cavoatrial junction. Hazy density at the right base with obscured diaphragm. Mild improvement of aeration on the left where there is streaky type density. No visible pneumothorax. Artifact from support hardware. IMPRESSION: 1. Stable  hardware positioning. 2. Suspect right lower lobe collapse. Generalized airspace disease has improved since admission radiographs. Electronically Signed   By: Monte Fantasia M.D.   On: 05/02/2020 10:28   DG Chest Port 1 View  Result Date: 05/01/2020 CLINICAL DATA:  Respiratory failure.  COVID positive. EXAM: PORTABLE CHEST 1 VIEW COMPARISON:  04/28/2020 FINDINGS: 0510 hours. Endotracheal tube tip is 1.6 cm above the base of the carina. A feeding tube passes into the stomach although the distal tip position is not included on the film. Right PICC line tip overlies the expected position of the mid to distal SVC. Progressive bibasilar collapse/consolidation with cardiomegaly and vascular congestion. Potential layering bilateral pleural effusions. IMPRESSION: 1. Progressive bibasilar collapse/consolidation with cardiomegaly and vascular congestion. 2. Possible layering bilateral pleural effusions. Electronically Signed   By: Misty Stanley M.D.   On: 05/01/2020 07:50   DG CHEST PORT 1 VIEW  Result Date: 04/28/2020 CLINICAL DATA:  COVID-19 positive. Shortness of breath. ETT placement. EXAM: PORTABLE CHEST 1 VIEW COMPARISON:  April 27, 2020 FINDINGS: The ETT is in good position. The feeding tube is not well seen distally due to poor penetration but appears to terminate below today's film. Stable right PICC line. No pneumothorax. No focal infiltrate on the right. Persistent infiltrate in the left base. Possible layering effusion. IMPRESSION: 1. Possible left layering effusion. Infiltrate in left base is stable. No focal infiltrate seen on the right on today's study. 2. Support apparatus as above. Electronically Signed   By: Dorise Bullion III M.D   On: 04/28/2020 11:56   DG CHEST PORT 1 VIEW  Result Date: 04/27/2020 CLINICAL DATA:  Respiratory failure EXAM: PORTABLE CHEST 1 VIEW COMPARISON:  April 06, 2020 FINDINGS: The right PICC line is stable. The feeding tube terminates below today's film. The ETT is in  good position. No pneumothorax. Bilateral patchy pulmonary infiltrates are similar in the interval. No interval changes. IMPRESSION: 1. Support apparatus as above. 2. Bilateral pulmonary infiltrates are similar in the interval. Recommend follow-up to resolution.  Electronically Signed   By: Dorise Bullion III M.D   On: 04/27/2020 11:18   DG CHEST PORT 1 VIEW  Result Date: 04/26/2020 CLINICAL DATA:  COVID. EXAM: PORTABLE CHEST 1 VIEW COMPARISON:  04/25/2020. FINDINGS: Endotracheal tube, feeding tube, right PICC line in stable position. Heart size stable. Diffuse bilateral pulmonary infiltrates again noted. Similar findings on prior exam. No pleural effusion or pneumothorax. IMPRESSION: 1.  Lines and tubes in stable position. 2. Diffuse bilateral pulmonary infiltrates again noted without interim change. Electronically Signed   By: Marcello Moores  Register   On: 04/26/2020 06:37   DG Chest Port 1 View  Result Date: 04/25/2020 CLINICAL DATA:  COVID 19. EXAM: PORTABLE CHEST 1 VIEW COMPARISON:  04/24/2020. FINDINGS: Endotracheal tube, feeding tube, right PICC line stable position. Heart size normal. Diffuse bilateral pulmonary infiltrates again noted without interim change. Interim improvement of right perihilar atelectasis. No pleural effusion or pneumothorax. IMPRESSION: 1.  Lines and tubes in stable position. 2. Diffuse bilateral pulmonary infiltrates again noted without interim change. Interim improvement of right perihilar atelectasis. Electronically Signed   By: Marcello Moores  Register   On: 04/25/2020 06:43   DG CHEST PORT 1 VIEW  Result Date: 04/24/2020 CLINICAL DATA:  54 year old female with history of adult respiratory distress syndrome. EXAM: PORTABLE CHEST 1 VIEW COMPARISON:  Chest x-ray 04/24/2020. FINDINGS: An endotracheal tube is in place with tip 3.7 cm above the carina. A feeding tube is seen extending into the abdomen, however, the tip of the feeding tube extends below the lower margin of the image. Lung  volumes are normal. Patchy multifocal interstitial and ill-defined airspace opacities are noted throughout the lungs bilaterally, with slightly improved aeration compared to the prior examination. Trace left pleural effusion. No right pleural effusion. No definite evidence of pulmonary edema. Heart size is normal. Upper mediastinal contours are within normal limits. IMPRESSION: 1. Support apparatus, as above. 2. The appearance the chest is most suggestive of severe multilobar bilateral pneumonia, with slight improvement in aeration compared to the prior examination, as above. Electronically Signed   By: Vinnie Langton M.D.   On: 04/24/2020 15:26   DG CHEST PORT 1 VIEW  Result Date: 04/24/2020 CLINICAL DATA:  Hypoxia EXAM: PORTABLE CHEST 1 VIEW COMPARISON:  April 23, 2020 FINDINGS: Endotracheal tube tip is 2.4 cm above the carina. Feeding tube tip is below the diaphragm. Central catheter tip is in the superior vena cava. No pneumothorax. There is airspace opacity throughout the lungs bilaterally, similar to 1 day prior. No new opacity evident. Heart size and pulmonary vascularity are within normal limits. No evident adenopathy. No bone lesions. IMPRESSION: Tube and catheter positions as described without pneumothorax. Airspace opacity throughout the lungs bilaterally, stable. Stable cardiac silhouette. Electronically Signed   By: Lowella Grip III M.D.   On: 04/24/2020 08:10   DG Chest Port 1 View  Result Date: 04/23/2020 CLINICAL DATA:  Endotracheal tube placement, moving patient from prone to supine. EXAM: PORTABLE CHEST 1 VIEW COMPARISON:  Radiograph earlier this day. FINDINGS: Borderline low positioning of the endotracheal tube tip 13 mm from the carina. Enteric tube in place, tip included on concurrent abdominal radiographs. There is a right upper extremity PICC with tip in the mid SVC. Slight improvement in heterogeneous bilateral lung opacities from earlier today. Stable heart size and  mediastinal contours. No evidence of pneumothorax or large pleural effusion. No visualized pneumomediastinum. IMPRESSION: 1. Borderline low positioning of the endotracheal tube tip 13 mm from the carina. 2. Right upper extremity  PICC with tip in the mid SVC. 3. Slight improvement in bilateral heterogeneous lung opacities from earlier today. Electronically Signed   By: Keith Rake M.D.   On: 04/23/2020 17:19   DG CHEST PORT 1 VIEW  Result Date: 04/23/2020 CLINICAL DATA:  COVID-19, intubation EXAM: PORTABLE CHEST 1 VIEW COMPARISON:  Portable exam 0448 hours compared to [id number] hours FINDINGS: Tip of endotracheal tube projects approximately 2.1 cm above carina. Nasogastric tube extends into stomach. RIGHT arm PICC line unchanged. Stable heart size mediastinal contours. Severe diffuse BILATERAL airspace infiltrates consistent with multifocal pneumonia and history of COVID-19, slightly increased in RIGHT upper lobe. No pleural effusion or pneumothorax. IMPRESSION: Multifocal pneumonia consistent with history of COVID-19, slightly increased. Electronically Signed   By: Lavonia Dana M.D.   On: 04/23/2020 07:16   DG Chest Port 1 View  Result Date: 04/22/2020 CLINICAL DATA:  Endotracheal tube placement EXAM: PORTABLE CHEST 1 VIEW COMPARISON:  4:56 a.m. FINDINGS: Endotracheal tube is seen 3.6 cm above the carina. Right upper extremity PICC line with its tip within the superior cavoatrial junction and nasogastric tube extending into the upper abdomen beyond the margin of the examination are unchanged. Extensive multifocal airspace infiltrates are again identified, progressive within the right upper lobe. There is diminished pulmonary insufflation when compared to prior examination. Cardiac size is at the upper limits of normal. No pneumothorax or pleural effusion. IMPRESSION: 1. Lines and tubes in satisfactory position. 2. Extensive multifocal airspace infiltrates, progressive within the right upper lobe. 3.  Diminished pulmonary vascular insufflation when compared to prior examination. Electronically Signed   By: Fidela Salisbury MD   On: 04/22/2020 20:58   DG Chest Port 1 View  Result Date: 04/22/2020 CLINICAL DATA:  Respiratory failure. ARDS secondary to COVID 19 virus. EXAM: PORTABLE CHEST 1 VIEW COMPARISON:  One-view chest x-ray 04/21/2020 FINDINGS: Heart size is exaggerated by low volumes. Diffuse interstitial and airspace opacities are similar the prior exams. Tracheal tube enteric tube are stable. IMPRESSION: Stable appearance of diffuse interstitial and airspace disease consistent with ARDS. Electronically Signed   By: San Morelle M.D.   On: 04/22/2020 06:30   DG CHEST PORT 1 VIEW  Result Date: 04/21/2020 CLINICAL DATA:  Endotracheal tube placement EXAM: PORTABLE CHEST 1 VIEW COMPARISON:  04/20/2020 FINDINGS: Endotracheal tube tip is at the level of the clavicular heads. Esophageal tube courses beyond the field of view. Bilateral upper lobe predominant airspace opacities are unchanged. IMPRESSION: 1. Endotracheal tube tip at the level of the clavicular heads. 2. Unchanged bilateral upper lobe predominant airspace opacities. Electronically Signed   By: Ulyses Jarred M.D.   On: 04/21/2020 00:38   ECHOCARDIOGRAM COMPLETE  Result Date: 04/24/2020    ECHOCARDIOGRAM REPORT   Patient Name:   [name] Date of Exam: 04/24/2020 Medical Rec #:  [id number]          Height:       63.0 in Accession #:    [id number]         Weight:       276.9 lb Date of Birth:  06/16/1966         BSA:          2.220 m Patient Age:    54 years           BP:           148/55 mmHg Patient Gender: F  HR:           81 bpm. Exam Location:  Inpatient Procedure: 2D Echo Indications:    acute respiratory insufficiency 518.82  History:        Patient has no prior history of Echocardiogram examinations.                 Risk Factors:Diabetes, Dyslipidemia and Hypertension. Covid +.  Sonographer:    Jannett Celestine RDCS (AE) Referring Phys: [id number] MURALI RAMASWAMY  Sonographer Comments: Echo performed with patient supine and on artificial respirator. Image acquisition challenging due to patient body habitus and Image acquisition challenging due to respiratory motion. IMPRESSIONS  1. Left ventricular ejection fraction, by estimation, is 55 to 60%. The left ventricle has normal function. Left ventricular endocardial border not optimally defined to evaluate regional wall motion. Left ventricular diastolic parameters are consistent with Grade I diastolic dysfunction (impaired relaxation).  2. Right ventricular systolic function is normal. The right ventricular size is normal. Tricuspid regurgitation signal is inadequate for assessing PA pressure.  3. The mitral valve is grossly normal. No evidence of mitral valve regurgitation. No evidence of mitral stenosis.  4. The aortic valve is grossly normal. Aortic valve regurgitation is not visualized. No aortic stenosis is present. FINDINGS  Left Ventricle: Left ventricular ejection fraction, by estimation, is 55 to 60%. The left ventricle has normal function. Left ventricular endocardial border not optimally defined to evaluate regional wall motion. The left ventricular internal cavity size was normal in size. There is no left ventricular hypertrophy. Left ventricular diastolic parameters are consistent with Grade I diastolic dysfunction (impaired relaxation). Right Ventricle: The right ventricular size is normal. No increase in right ventricular wall thickness. Right ventricular systolic function is normal. Tricuspid regurgitation signal is inadequate for assessing PA pressure. Left Atrium: Left atrial size was normal in size. Right Atrium: Right atrial size was normal in size. Pericardium: Trivial pericardial effusion is present. Presence of pericardial fat pad. Mitral Valve: The mitral valve is grossly normal. There is mild calcification of the anterior mitral valve leaflet(s).  Mild mitral annular calcification. No evidence of mitral valve regurgitation. No evidence of mitral valve stenosis. Tricuspid Valve: The tricuspid valve is grossly normal. Tricuspid valve regurgitation is trivial. No evidence of tricuspid stenosis. Aortic Valve: The aortic valve is grossly normal. Aortic valve regurgitation is not visualized. No aortic stenosis is present. Pulmonic Valve: The pulmonic valve was grossly normal. Pulmonic valve regurgitation is not visualized. No evidence of pulmonic stenosis. Aorta: The aortic root is normal in size and structure. Venous: IVC assessment for right atrial pressure unable to be performed due to mechanical ventilation. IAS/Shunts: The atrial septum is grossly normal.  LEFT VENTRICLE PLAX 2D LVIDd:         3.90 cm  Diastology LVIDs:         2.40 cm  LV e' lateral:   8.16 cm/s LV PW:         1.00 cm  LV E/e' lateral: 9.0 LV IVS:        1.00 cm  LV e' medial:    6.74 cm/s LVOT diam:     2.00 cm  LV E/e' medial:  10.9 LV SV:         58 LV SV Index:   26 LVOT Area:     3.14 cm  LEFT ATRIUM           Index LA diam:      2.90 cm 1.31 cm/m LA Vol (A4C):  18.1 ml 8.15 ml/m  AORTIC VALVE LVOT Vmax:   80.30 cm/s LVOT Vmean:  52.900 cm/s LVOT VTI:    0.186 m  AORTA Ao Root diam: 2.90 cm MITRAL VALVE MV Area (PHT): 2.42 cm    SHUNTS MV Decel Time: 313 msec    Systemic VTI:  0.19 m MV E velocity: 73.30 cm/s  Systemic Diam: 2.00 cm MV A velocity: 75.40 cm/s MV E/A ratio:  0.97 Eleonore Chiquito MD Electronically signed by Eleonore Chiquito MD Signature Date/Time: 04/24/2020/4:23:44 PM    Final     Microbiology: No results found for this or any previous visit (from the past 240 hour(s)).   Labs: Basic Metabolic Panel: Recent Labs  Lab 05/11/20 0553 05/12/20 0500 05/13/20 0516 05/14/20 0626 05/16/20 0819  NA 144 141 141 142 140  K 3.9 3.6 3.6 3.7 3.4*  CL 109 107 107 107 107  CO2 22 23 25 24 24   GLUCOSE 141* 111* 99 112* 112*  BUN 33* 23* 23* 25* 16  CREATININE 0.75 0.69  0.81 0.96 0.43*  CALCIUM 8.5* 8.3* 8.3* 8.6* 9.0   Liver Function Tests: Recent Labs  Lab 05/12/20 0500 05/13/20 0516 05/14/20 0626  AST 18 22 57*  ALT 49* 47* 86*  ALKPHOS 69 92 266*  BILITOT 1.0 0.8 0.9  PROT 5.9* 5.6* 5.6*  ALBUMIN 2.6* 2.5* 2.5*   No results for input(s): LIPASE, AMYLASE in the last 168 hours. No results for input(s): AMMONIA in the last 168 hours. CBC: Recent Labs  Lab 05/11/20 0553 05/12/20 0500 05/13/20 0516 05/14/20 0626  WBC 9.6 9.1 6.1 6.5  NEUTROABS  --  7.0 4.1 4.9  HGB 10.5* 10.7* 10.1* 10.0*  HCT 32.6* 32.4* 31.2* 30.4*  MCV 102.5* 102.2* 102.6* 102.4*  PLT 131* 133* 138* 142*   Cardiac Enzymes: No results for input(s): CKTOTAL, CKMB, CKMBINDEX, TROPONINI in the last 168 hours. BNP: BNP (last 3 results) No results for input(s): BNP in the last 8760 hours.  ProBNP (last 3 results) No results for input(s): PROBNP in the last 8760 hours.  CBG: Recent Labs  Lab 05/15/20 1757 05/15/20 2221 05/16/20 0011 05/16/20 0541 05/16/20 1219  GLUCAP 139* 116* 109* 92 136*     No charge  Signed:  Irine Seal MD.  [facility] 05/16/2020, 1:25 PM

## 2020-05-27 ENCOUNTER — Other Ambulatory Visit (HOSPITAL_COMMUNITY): Payer: BC Managed Care – PPO

## 2020-05-27 ENCOUNTER — Encounter (HOSPITAL_COMMUNITY): Admission: RE | Admit: 2020-05-27 | Payer: BC Managed Care – PPO | Source: Ambulatory Visit

## 2020-05-29 ENCOUNTER — Encounter (HOSPITAL_COMMUNITY): Admission: RE | Payer: Self-pay | Source: Home / Self Care

## 2020-05-29 ENCOUNTER — Ambulatory Visit (HOSPITAL_COMMUNITY)
Admission: RE | Admit: 2020-05-29 | Payer: BC Managed Care – PPO | Source: Home / Self Care | Admitting: Internal Medicine

## 2020-05-29 SURGERY — COLONOSCOPY WITH PROPOFOL
Anesthesia: Monitor Anesthesia Care

## 2020-05-30 DIAGNOSIS — I2699 Other pulmonary embolism without acute cor pulmonale: Secondary | ICD-10-CM

## 2020-05-30 DIAGNOSIS — I82409 Acute embolism and thrombosis of unspecified deep veins of unspecified lower extremity: Secondary | ICD-10-CM

## 2020-05-30 HISTORY — PX: CARDIAC CATHETERIZATION: SHX172

## 2020-05-30 HISTORY — DX: Acute embolism and thrombosis of unspecified deep veins of unspecified lower extremity: I82.409

## 2020-05-30 HISTORY — DX: Other pulmonary embolism without acute cor pulmonale: I26.99

## 2020-06-04 LAB — FUNGUS CULTURE WITH STAIN

## 2020-06-04 LAB — FUNGAL ORGANISM REFLEX

## 2020-06-04 LAB — FUNGUS CULTURE RESULT

## 2020-06-18 LAB — ACID FAST CULTURE WITH REFLEXED SENSITIVITIES (MYCOBACTERIA): Acid Fast Culture: NEGATIVE

## 2020-07-29 ENCOUNTER — Telehealth (INDEPENDENT_AMBULATORY_CARE_PROVIDER_SITE_OTHER): Payer: Self-pay | Admitting: *Deleted

## 2020-07-29 NOTE — Telephone Encounter (Signed)
Noted, thank you

## 2020-07-29 NOTE — Telephone Encounter (Signed)
Patient LM - wants to resch'd TCS with propofol she had to cancel in Sept - would like it before end of year if possible - please call [phone]

## 2020-07-30 ENCOUNTER — Other Ambulatory Visit (INDEPENDENT_AMBULATORY_CARE_PROVIDER_SITE_OTHER): Payer: Self-pay

## 2020-07-30 ENCOUNTER — Telehealth (INDEPENDENT_AMBULATORY_CARE_PROVIDER_SITE_OTHER): Payer: Self-pay

## 2020-07-30 DIAGNOSIS — K5010 Crohn's disease of large intestine without complications: Secondary | ICD-10-CM

## 2020-07-30 MED ORDER — PLENVU 140 G PO SOLR
280.0000 g | Freq: Once | ORAL | 0 refills | Status: AC
Start: 2020-07-30 — End: 2020-07-30

## 2020-07-30 NOTE — Telephone Encounter (Signed)
Patient has coupon card

## 2020-07-31 ENCOUNTER — Encounter (INDEPENDENT_AMBULATORY_CARE_PROVIDER_SITE_OTHER): Payer: Self-pay

## 2020-07-31 ENCOUNTER — Telehealth (INDEPENDENT_AMBULATORY_CARE_PROVIDER_SITE_OTHER): Payer: Self-pay

## 2020-07-31 DIAGNOSIS — K5010 Crohn's disease of large intestine without complications: Secondary | ICD-10-CM

## 2020-07-31 MED ORDER — PLENVU 140 G PO SOLR: 280.0000 g | Freq: Once | ORAL | 0 refills | Status: AC

## 2020-07-31 NOTE — Telephone Encounter (Signed)
[name], CMA  

## 2020-08-01 ENCOUNTER — Other Ambulatory Visit (INDEPENDENT_AMBULATORY_CARE_PROVIDER_SITE_OTHER): Payer: Self-pay | Admitting: Internal Medicine

## 2020-08-01 MED ORDER — DICYCLOMINE HCL 20 MG PO TABS
20.0000 mg | ORAL_TABLET | Freq: Three times a day (TID) | ORAL | 5 refills | Status: DC | PRN
Start: 2020-08-01 — End: 2021-03-05

## 2020-08-01 NOTE — Progress Notes (Signed)
Prescription for dicyclomine 20 mg 3 times a day as needed sent to patient's pharmacy 90 doses with 5 refills.

## 2020-08-20 NOTE — Patient Instructions (Signed)
[name]  08/20/2020     @PREFPERIOPPHARMACY @   Your procedure is scheduled on  08/28/2020  Report to [facility] at  1100  A.M.  Call this number if you have problems the morning of surgery:  [phone]   Remember:  Follow the diet and prep instructions given to you by the office.                       Take these medicines the morning of surgery with A SIP OF WATER  Zyrtec, bentyl, cymbalta, gabapentin,mercaptopurin. Use your inhalers before you come. Follow any instructions given to you concerning your eliquis.    Do not wear jewelry, make-up or nail polish.  Do not wear lotions, powders, or perfumes. Please wear deodorant and brush your teeth.  Do not shave 48 hours prior to surgery.  Men may shave face and neck.  Do not bring valuables to the hospital.  [facility] is not responsible for any belongings or valuables.  Contacts, dentures or bridgework may not be worn into surgery.  Leave your suitcase in the car.  After surgery it may be brought to your room.  For patients admitted to the hospital, discharge time will be determined by your treatment team.  Patients discharged the day of surgery will not be allowed to drive home.   Name and phone number of your driver:   family Special instructions:   DO NOT smoke the morning of your procedure.  Please read over the following fact sheets that you were given. Anesthesia Post-op Instructions and Care and Recovery After Surgery       Colonoscopy, Adult, Care After This sheet gives you information about how to care for yourself after your procedure. Your health care provider may also give you more specific instructions. If you have problems or questions, contact your health care provider. What can I expect after the procedure? After the procedure, it is common to have:  A small amount of blood in your stool for 24 hours after the procedure.  Some gas.  Mild cramping or bloating of your  abdomen. Follow these instructions at home: Eating and drinking   Drink enough fluid to keep your urine pale yellow.  Follow instructions from your health care provider about eating or drinking restrictions.  Resume your normal diet as instructed by your health care provider. Avoid heavy or fried foods that are hard to digest. Activity  Rest as told by your health care provider.  Avoid sitting for a long time without moving. Get up to take short walks every 1-2 hours. This is important to improve blood flow and breathing. Ask for help if you feel weak or unsteady.  Return to your normal activities as told by your health care provider. Ask your health care provider what activities are safe for you. Managing cramping and bloating   Try walking around when you have cramps or feel bloated.  Apply heat to your abdomen as told by your health care provider. Use the heat source that your health care provider recommends, such as a moist heat pack or a heating pad. ? Place a towel between your skin and the heat source. ? Leave the heat on for 20-30 minutes. ? Remove the heat if your skin turns bright red. This is especially important if you are unable to feel pain, heat, or cold. You may have a greater risk of getting burned. General instructions  For  the first 24 hours after the procedure: ? Do not drive or use machinery. ? Do not sign important documents. ? Do not drink alcohol. ? Do your regular daily activities at a slower pace than normal. ? Eat soft foods that are easy to digest.  Take over-the-counter and prescription medicines only as told by your health care provider.  Keep all follow-up visits as told by your health care provider. This is important. Contact a health care provider if:  You have blood in your stool 2-3 days after the procedure. Get help right away if you have:  More than a small spotting of blood in your stool.  Large blood clots in your stool.  Swelling  of your abdomen.  Nausea or vomiting.  A fever.  Increasing pain in your abdomen that is not relieved with medicine. Summary  After the procedure, it is common to have a small amount of blood in your stool. You may also have mild cramping and bloating of your abdomen.  For the first 24 hours after the procedure, do not drive or use machinery, sign important documents, or drink alcohol.  Get help right away if you have a lot of blood in your stool, nausea or vomiting, a fever, or increased pain in your abdomen. This information is not intended to replace advice given to you by your health care provider. Make sure you discuss any questions you have with your health care provider. Document Revised: 03/13/2019 Document Reviewed: 03/13/2019 Elsevier Patient Education  [place] After These instructions provide you with information about caring for yourself after your procedure. Your health care provider may also give you more specific instructions. Your treatment has been planned according to current medical practices, but problems sometimes occur. Call your health care provider if you have any problems or questions after your procedure. What can I expect after the procedure? After your procedure, you may:  Feel sleepy for several hours.  Feel clumsy and have poor balance for several hours.  Feel forgetful about what happened after the procedure.  Have poor judgment for several hours.  Feel nauseous or vomit.  Have a sore throat if you had a breathing tube during the procedure. Follow these instructions at home: For at least 24 hours after the procedure:      Have a responsible adult stay with you. It is important to have someone help care for you until you are awake and alert.  Rest as needed.  Do not: ? Participate in activities in which you could fall or become injured. ? Drive. ? Use heavy machinery. ? Drink alcohol. ? Take  sleeping pills or medicines that cause drowsiness. ? Make important decisions or sign legal documents. ? Take care of children on your own. Eating and drinking  Follow the diet that is recommended by your health care provider.  If you vomit, drink water, juice, or soup when you can drink without vomiting.  Make sure you have little or no nausea before eating solid foods. General instructions  Take over-the-counter and prescription medicines only as told by your health care provider.  If you have sleep apnea, surgery and certain medicines can increase your risk for breathing problems. Follow instructions from your health care provider about wearing your sleep device: ? Anytime you are sleeping, including during daytime naps. ? While taking prescription pain medicines, sleeping medicines, or medicines that make you drowsy.  If you smoke, do not smoke without supervision.  Keep all  follow-up visits as told by your health care provider. This is important. Contact a health care provider if:  You keep feeling nauseous or you keep vomiting.  You feel light-headed.  You develop a rash.  You have a fever. Get help right away if:  You have trouble breathing. Summary  For several hours after your procedure, you may feel sleepy and have poor judgment.  Have a responsible adult stay with you for at least 24 hours or until you are awake and alert. This information is not intended to replace advice given to you by your health care provider. Make sure you discuss any questions you have with your health care provider. Document Revised: 11/15/2017 Document Reviewed: 12/08/2015 Elsevier Patient Education  [place].

## 2020-08-21 ENCOUNTER — Encounter (HOSPITAL_COMMUNITY)
Admission: RE | Admit: 2020-08-21 | Discharge: 2020-08-21 | Disposition: A | Discharge status: Home / Self Care | Payer: BC Managed Care – PPO | Source: Ambulatory Visit | Attending: Internal Medicine | Admitting: Internal Medicine

## 2020-08-21 ENCOUNTER — Telehealth (INDEPENDENT_AMBULATORY_CARE_PROVIDER_SITE_OTHER): Payer: Self-pay

## 2020-08-21 ENCOUNTER — Encounter (INDEPENDENT_AMBULATORY_CARE_PROVIDER_SITE_OTHER): Payer: Self-pay

## 2020-08-21 ENCOUNTER — Other Ambulatory Visit: Payer: Self-pay

## 2020-08-21 ENCOUNTER — Encounter (HOSPITAL_COMMUNITY): Payer: Self-pay

## 2020-08-21 DIAGNOSIS — Z01812 Encounter for preprocedural laboratory examination: Secondary | ICD-10-CM | POA: Diagnosis not present

## 2020-08-21 DIAGNOSIS — Z1211 Encounter for screening for malignant neoplasm of colon: Secondary | ICD-10-CM

## 2020-08-21 LAB — BASIC METABOLIC PANEL
Anion gap: 11 (ref 5–15)
BUN: 13 mg/dL (ref 6–20)
CO2: 28 mmol/L (ref 22–32)
Calcium: 8.8 mg/dL — ABNORMAL LOW (ref 8.9–10.3)
Chloride: 100 mmol/L (ref 98–111)
Creatinine, Ser: 0.64 mg/dL (ref 0.44–1.00)
GFR, Estimated: >60 mL/min (ref >60)
Glucose, Bld: 121 mg/dL — ABNORMAL HIGH (ref 70–99)
Potassium: 3.6 mmol/L (ref 3.5–5.1)
Sodium: 139 mmol/L (ref 135–145)

## 2020-08-21 MED ORDER — PEG 3350-KCL-NA BICARB-NACL 420 G PO SOLR: 4000.0000 mL | ORAL | 0 refills | Status: DC

## 2020-08-21 NOTE — Telephone Encounter (Signed)
LeighAnn Nitza Schmid, CMA  

## 2020-08-21 NOTE — Telephone Encounter (Signed)
LeighAnn Maude Gloor, CMA  

## 2020-08-27 ENCOUNTER — Other Ambulatory Visit: Payer: Self-pay

## 2020-08-27 ENCOUNTER — Other Ambulatory Visit (HOSPITAL_COMMUNITY)
Admission: RE | Admit: 2020-08-27 | Discharge: 2020-08-27 | Disposition: A | Discharge status: Home / Self Care | Payer: BC Managed Care – PPO | Source: Ambulatory Visit | Attending: Internal Medicine | Admitting: Internal Medicine

## 2020-08-27 DIAGNOSIS — U071 COVID-19: Secondary | ICD-10-CM | POA: Insufficient documentation

## 2020-08-27 DIAGNOSIS — Z01812 Encounter for preprocedural laboratory examination: Secondary | ICD-10-CM | POA: Insufficient documentation

## 2020-08-28 ENCOUNTER — Encounter (HOSPITAL_COMMUNITY): Admission: RE | Payer: Self-pay | Source: Home / Self Care

## 2020-08-28 ENCOUNTER — Telehealth (INDEPENDENT_AMBULATORY_CARE_PROVIDER_SITE_OTHER): Payer: Self-pay | Admitting: *Deleted

## 2020-08-28 ENCOUNTER — Ambulatory Visit (HOSPITAL_COMMUNITY)
Admission: RE | Admit: 2020-08-28 | Payer: BC Managed Care – PPO | Source: Home / Self Care | Admitting: Internal Medicine

## 2020-08-28 LAB — SARS CORONAVIRUS 2 (TAT 6-24 HRS): SARS Coronavirus 2: POSITIVE — AB

## 2020-08-28 SURGERY — COLONOSCOPY WITH PROPOFOL
Anesthesia: Monitor Anesthesia Care

## 2020-08-28 NOTE — OR Nursing (Signed)
Dr. Laural Golden  Notified of covid positive. He will evaluate situation on arrival to Short Stay.

## 2020-08-28 NOTE — Telephone Encounter (Signed)
Patient was called and made aware that the CoVid Test was Positive. She denies any symptoms of CoVid.  Procedure for the patient has been cancelled for today. Patient was advised to contact her PCP and make aware.  Per Dr.[name] advised that the patient may go back on her anticoagulates and diet. Patient will contact us when she has tested negative so that the procedure may be put back on schedule.

## 2020-08-30 ENCOUNTER — Other Ambulatory Visit (INDEPENDENT_AMBULATORY_CARE_PROVIDER_SITE_OTHER): Payer: Self-pay | Admitting: Internal Medicine

## 2020-09-02 NOTE — Telephone Encounter (Signed)
Last seen 04/03/2020 by Thayer Headings for Crohns

## 2020-12-05 ENCOUNTER — Other Ambulatory Visit (INDEPENDENT_AMBULATORY_CARE_PROVIDER_SITE_OTHER): Payer: Self-pay | Admitting: Internal Medicine

## 2020-12-05 DIAGNOSIS — K5010 Crohn's disease of large intestine without complications: Secondary | ICD-10-CM

## 2020-12-09 ENCOUNTER — Telehealth (INDEPENDENT_AMBULATORY_CARE_PROVIDER_SITE_OTHER): Payer: Self-pay

## 2020-12-09 ENCOUNTER — Other Ambulatory Visit (INDEPENDENT_AMBULATORY_CARE_PROVIDER_SITE_OTHER): Payer: Self-pay

## 2020-12-09 DIAGNOSIS — K5010 Crohn's disease of large intestine without complications: Secondary | ICD-10-CM

## 2020-12-09 MED ORDER — MESALAMINE ER 500 MG PO CPCR: ORAL_CAPSULE | ORAL | 11 refills | Status: DC

## 2020-12-09 NOTE — Telephone Encounter (Signed)
[name], CMA  

## 2021-03-05 ENCOUNTER — Other Ambulatory Visit (INDEPENDENT_AMBULATORY_CARE_PROVIDER_SITE_OTHER): Payer: Self-pay | Admitting: Internal Medicine

## 2021-03-05 NOTE — Telephone Encounter (Signed)
Last seen 04/03/2020 by Thayer Headings for Crohn's

## 2021-03-26 ENCOUNTER — Other Ambulatory Visit: Payer: Self-pay | Admitting: Orthopaedic Surgery

## 2021-03-26 DIAGNOSIS — M25512 Pain in left shoulder: Secondary | ICD-10-CM

## 2021-04-11 ENCOUNTER — Ambulatory Visit
Admission: RE | Admit: 2021-04-11 | Discharge: 2021-04-11 | Disposition: A | Discharge status: Home / Self Care | Payer: BC Managed Care – PPO | Source: Ambulatory Visit | Attending: Orthopaedic Surgery | Admitting: Orthopaedic Surgery

## 2021-04-11 ENCOUNTER — Other Ambulatory Visit: Payer: Self-pay

## 2021-04-11 DIAGNOSIS — M25512 Pain in left shoulder: Secondary | ICD-10-CM

## 2021-04-14 ENCOUNTER — Other Ambulatory Visit (INDEPENDENT_AMBULATORY_CARE_PROVIDER_SITE_OTHER): Payer: Self-pay

## 2021-04-14 ENCOUNTER — Other Ambulatory Visit (INDEPENDENT_AMBULATORY_CARE_PROVIDER_SITE_OTHER): Payer: Self-pay | Admitting: *Deleted

## 2021-04-14 ENCOUNTER — Telehealth (INDEPENDENT_AMBULATORY_CARE_PROVIDER_SITE_OTHER): Payer: Self-pay | Admitting: *Deleted

## 2021-04-14 ENCOUNTER — Encounter (INDEPENDENT_AMBULATORY_CARE_PROVIDER_SITE_OTHER): Payer: Self-pay | Admitting: *Deleted

## 2021-04-14 DIAGNOSIS — Z1211 Encounter for screening for malignant neoplasm of colon: Secondary | ICD-10-CM

## 2021-04-14 NOTE — Telephone Encounter (Signed)
Referring MD/PCP: daniel  Procedure: tcs w propofol  Reason/Indication:  hx crohn's colitis  Has patient had this procedure before?  Yes, 2016  If so, when, by whom and where?    Is there a family history of colon cancer?  no  Who?  What age when diagnosed?    Is patient diabetic? If yes, Type 1 or Type 2   yes      Does patient have prosthetic heart valve or mechanical valve?  no  Do you have a pacemaker/defibrillator?  no  Has patient ever had endocarditis/atrial fibrillation? no  Does patient use oxygen? no  Has patient had joint replacement within last 12 months?  no  Is patient constipated or do they take laxatives? no  Does patient have a history of alcohol/drug use?  no  Have you had a stroke/heart attack last 6 mths? no  Do you take medicine for weight loss?  no  For female patients,: do you still have your menstrual cycle? no  Is patient on blood thinner such as Coumadin, Plavix and/or Aspirin? no  Medications: tylenol 500 mg prn, albuterol 108 mcg 2 puffs prn, calcium daily, zyrtec 10 mg daily, dicyclomine 20 mg tid, Cymbalta 60 mg daily, jardiance 25 mg daily, Flonase prn, Advair 250/50 mcg 1 puff in am, lisinopril/hctz 20/12.5 mg daily, magnesium 400 mg daily, mercaptopurine 50 mg 1 1/2 tab daily, mesalamine 500 mg 2 tab qid, MVI daily, trazodone 50 mg daily, crestor 20 mg daily  Allergies: remicade, pcn, cefprozil  Medication Adjustment per Dr [name]/Dr Jenetta Downer hold diabetic meds evening before and morning of  Procedure date & time: 05/14/21

## 2021-04-14 NOTE — Telephone Encounter (Signed)
Patient needs trilyte 

## 2021-04-15 ENCOUNTER — Telehealth (INDEPENDENT_AMBULATORY_CARE_PROVIDER_SITE_OTHER): Payer: Self-pay

## 2021-04-15 MED ORDER — PEG 3350-KCL-NA BICARB-NACL 420 G PO SOLR: 4000.0000 mL | ORAL | 0 refills | Status: DC

## 2021-04-15 MED ORDER — PEG 3350-KCL-NA BICARB-NACL 420 G PO SOLR: 4000.0000 mL | Freq: Once | ORAL | 0 refills | Status: AC

## 2021-04-15 NOTE — Telephone Encounter (Signed)
[name], CMA  

## 2021-05-08 NOTE — Patient Instructions (Signed)
[name]  05/08/2021     @PREFPERIOPPHARMACY @   Your procedure is scheduled on  05/14/2021.   Report to [facility] at  [place]  A.M.   Call this number if you have problems the morning of surgery:  [phone]   Remember:  Follow the diet and prep instructions given to you by the office.     Use your nebulizer and you inhalers before you come. Bring your rescue inhaler with you.    DO NOT take any medications for diabetes the morning of you procedure.     Take these medicines the morning of surgery with A SIP OF WATER                  Cymbalta, bentyl, purinethol, pentasa,     Do not wear jewelry, make-up or nail polish.  Do not wear lotions, powders, or perfumes, or deodorant.  Do not shave 48 hours prior to surgery.  Men may shave face and neck.  Do not bring valuables to the hospital.  [facility] is not responsible for any belongings or valuables.  Contacts, dentures or bridgework may not be worn into surgery.  Leave your suitcase in the car.  After surgery it may be brought to your room.  For patients admitted to the hospital, discharge time will be determined by your treatment team.  Patients discharged the day of surgery will not be allowed to drive home and must have someone with them for 24 hours.    Special instructions:   DO NOT smoke tobacco or vape for 24 hours before your procedure.  Please read over the following fact sheets that you were given. Anesthesia Post-op Instructions and Care and Recovery After Surgery      Colonoscopy, Adult, Care After This sheet gives you information about how to care for yourself after your procedure. Your health care provider may also give you more specific instructions. If you have problems or questions, contact your health care provider. What can I expect after the procedure? After the procedure, it is common to have: A small amount of blood in your stool for 24 hours after the procedure. Some  gas. Mild cramping or bloating of your abdomen. Follow these instructions at home: Eating and drinking  Drink enough fluid to keep your urine pale yellow. Follow instructions from your health care provider about eating or drinking restrictions. Resume your normal diet as instructed by your health care provider. Avoid heavy or fried foods that are hard to digest. Activity Rest as told by your health care provider. Avoid sitting for a long time without moving. Get up to take short walks every 1-2 hours. This is important to improve blood flow and breathing. Ask for help if you feel weak or unsteady. Return to your normal activities as told by your health care provider. Ask your health care provider what activities are safe for you. Managing cramping and bloating  Try walking around when you have cramps or feel bloated. Apply heat to your abdomen as told by your health care provider. Use the heat source that your health care provider recommends, such as a moist heat pack or a heating pad. Place a towel between your skin and the heat source. Leave the heat on for 20-30 minutes. Remove the heat if your skin turns bright red. This is especially important if you are unable to feel pain, heat, or cold. You may have a greater risk of getting burned.  General instructions If you were given a sedative during the procedure, it can affect you for several hours. Do not drive or operate machinery until your health care provider says that it is safe. For the first 24 hours after the procedure: Do not sign important documents. Do not drink alcohol. Do your regular daily activities at a slower pace than normal. Eat soft foods that are easy to digest. Take over-the-counter and prescription medicines only as told by your health care provider. Keep all follow-up visits as told by your health care provider. This is important. Contact a health care provider if: You have blood in your stool 2-3 days after the  procedure. Get help right away if you have: More than a small spotting of blood in your stool. Large blood clots in your stool. Swelling of your abdomen. Nausea or vomiting. A fever. Increasing pain in your abdomen that is not relieved with medicine. Summary After the procedure, it is common to have a small amount of blood in your stool. You may also have mild cramping and bloating of your abdomen. If you were given a sedative during the procedure, it can affect you for several hours. Do not drive or operate machinery until your health care provider says that it is safe. Get help right away if you have a lot of blood in your stool, nausea or vomiting, a fever, or increased pain in your abdomen. This information is not intended to replace advice given to you by your health care provider. Make sure you discuss any questions you have with your health care provider. Document Revised: 08/11/2019 Document Reviewed: 03/13/2019 Elsevier Patient Education  [place] After This sheet gives you information about how to care for yourself after your procedure. Your health care provider may also give you more specific instructions. If you have problems or questions, contact your health care provider. What can I expect after the procedure? After the procedure, it is common to have: Tiredness. Forgetfulness about what happened after the procedure. Impaired judgment for important decisions. Nausea or vomiting. Some difficulty with balance. Follow these instructions at home: For the time period you were told by your health care provider:   Rest as needed. Do not participate in activities where you could fall or become injured. Do not drive or use machinery. Do not drink alcohol. Do not take sleeping pills or medicines that cause drowsiness. Do not make important decisions or sign legal documents. Do not take care of children on your own. Eating and  drinking Follow the diet that is recommended by your health care provider. Drink enough fluid to keep your urine pale yellow. If you vomit: Drink water, juice, or soup when you can drink without vomiting. Make sure you have little or no nausea before eating solid foods. General instructions Have a responsible adult stay with you for the time you are told. It is important to have someone help care for you until you are awake and alert. Take over-the-counter and prescription medicines only as told by your health care provider. If you have sleep apnea, surgery and certain medicines can increase your risk for breathing problems. Follow instructions from your health care provider about wearing your sleep device: Anytime you are sleeping, including during daytime naps. While taking prescription pain medicines, sleeping medicines, or medicines that make you drowsy. Avoid smoking. Keep all follow-up visits as told by your health care provider. This is important. Contact a health care provider if: You keep  feeling nauseous or you keep vomiting. You feel light-headed. You are still sleepy or having trouble with balance after 24 hours. You develop a rash. You have a fever. You have redness or swelling around the IV site. Get help right away if: You have trouble breathing. You have new-onset confusion at home. Summary For several hours after your procedure, you may feel tired. You may also be forgetful and have poor judgment. Have a responsible adult stay with you for the time you are told. It is important to have someone help care for you until you are awake and alert. Rest as told. Do not drive or operate machinery. Do not drink alcohol or take sleeping pills. Get help right away if you have trouble breathing, or if you suddenly become confused. This information is not intended to replace advice given to you by your health care provider. Make sure you discuss any questions you have with your  health care provider. Document Revised: 05/02/2020 Document Reviewed: 07/20/2019 Elsevier Patient Education  2022 [employer].

## 2021-05-12 ENCOUNTER — Other Ambulatory Visit: Payer: Self-pay

## 2021-05-12 ENCOUNTER — Encounter (HOSPITAL_COMMUNITY)
Admission: RE | Admit: 2021-05-12 | Discharge: 2021-05-12 | Disposition: A | Discharge status: Home / Self Care | Payer: BC Managed Care – PPO | Source: Ambulatory Visit | Attending: Internal Medicine | Admitting: Internal Medicine

## 2021-05-12 ENCOUNTER — Encounter (HOSPITAL_COMMUNITY): Payer: Self-pay

## 2021-05-12 DIAGNOSIS — I10 Essential (primary) hypertension: Secondary | ICD-10-CM | POA: Diagnosis not present

## 2021-05-12 DIAGNOSIS — Z01818 Encounter for other preprocedural examination: Secondary | ICD-10-CM | POA: Insufficient documentation

## 2021-05-12 DIAGNOSIS — Z1211 Encounter for screening for malignant neoplasm of colon: Secondary | ICD-10-CM

## 2021-05-12 DIAGNOSIS — K635 Polyp of colon: Secondary | ICD-10-CM | POA: Diagnosis not present

## 2021-05-12 DIAGNOSIS — Z79899 Other long term (current) drug therapy: Secondary | ICD-10-CM | POA: Diagnosis not present

## 2021-05-12 DIAGNOSIS — E7800 Pure hypercholesterolemia, unspecified: Secondary | ICD-10-CM | POA: Diagnosis not present

## 2021-05-12 DIAGNOSIS — E669 Obesity, unspecified: Secondary | ICD-10-CM | POA: Diagnosis not present

## 2021-05-12 DIAGNOSIS — G4730 Sleep apnea, unspecified: Secondary | ICD-10-CM | POA: Diagnosis not present

## 2021-05-12 DIAGNOSIS — Z86711 Personal history of pulmonary embolism: Secondary | ICD-10-CM | POA: Diagnosis not present

## 2021-05-12 DIAGNOSIS — Z7951 Long term (current) use of inhaled steroids: Secondary | ICD-10-CM | POA: Diagnosis not present

## 2021-05-12 DIAGNOSIS — Z6841 Body Mass Index (BMI) 40.0 and over, adult: Secondary | ICD-10-CM | POA: Diagnosis not present

## 2021-05-12 DIAGNOSIS — Z8616 Personal history of COVID-19: Secondary | ICD-10-CM | POA: Diagnosis not present

## 2021-05-12 DIAGNOSIS — E119 Type 2 diabetes mellitus without complications: Secondary | ICD-10-CM | POA: Diagnosis not present

## 2021-05-12 DIAGNOSIS — Z9049 Acquired absence of other specified parts of digestive tract: Secondary | ICD-10-CM | POA: Diagnosis not present

## 2021-05-12 DIAGNOSIS — K5010 Crohn's disease of large intestine without complications: Secondary | ICD-10-CM | POA: Diagnosis not present

## 2021-05-12 DIAGNOSIS — Z888 Allergy status to other drugs, medicaments and biological substances status: Secondary | ICD-10-CM | POA: Diagnosis not present

## 2021-05-12 DIAGNOSIS — Z96641 Presence of right artificial hip joint: Secondary | ICD-10-CM | POA: Diagnosis not present

## 2021-05-12 DIAGNOSIS — Z86718 Personal history of other venous thrombosis and embolism: Secondary | ICD-10-CM | POA: Diagnosis not present

## 2021-05-12 DIAGNOSIS — Z880 Allergy status to penicillin: Secondary | ICD-10-CM | POA: Diagnosis not present

## 2021-05-12 LAB — BASIC METABOLIC PANEL
Anion gap: 8 (ref 5–15)
BUN: 16 mg/dL (ref 6–20)
CO2: 27 mmol/L (ref 22–32)
Calcium: 8.6 mg/dL — ABNORMAL LOW (ref 8.9–10.3)
Chloride: 101 mmol/L (ref 98–111)
Creatinine, Ser: 0.66 mg/dL (ref 0.44–1.00)
GFR, Estimated: >60 mL/min (ref >60)
Glucose, Bld: 185 mg/dL — ABNORMAL HIGH (ref 70–99)
Potassium: 3.4 mmol/L — ABNORMAL LOW (ref 3.5–5.1)
Sodium: 136 mmol/L (ref 135–145)

## 2021-05-14 ENCOUNTER — Ambulatory Visit (HOSPITAL_COMMUNITY)
Admission: RE | Admit: 2021-05-14 | Discharge: 2021-05-14 | Disposition: A | Discharge status: Home / Self Care | Payer: BC Managed Care – PPO | Attending: Internal Medicine | Admitting: Internal Medicine

## 2021-05-14 ENCOUNTER — Ambulatory Visit (HOSPITAL_COMMUNITY): Payer: BC Managed Care – PPO | Admitting: Anesthesiology

## 2021-05-14 ENCOUNTER — Other Ambulatory Visit: Payer: Self-pay

## 2021-05-14 ENCOUNTER — Encounter (HOSPITAL_COMMUNITY): Payer: Self-pay | Admitting: Internal Medicine

## 2021-05-14 ENCOUNTER — Encounter (HOSPITAL_COMMUNITY)
Admission: RE | Disposition: A | Discharge status: Home / Self Care | Payer: Self-pay | Source: Home / Self Care | Attending: Internal Medicine

## 2021-05-14 DIAGNOSIS — Z8616 Personal history of COVID-19: Secondary | ICD-10-CM | POA: Insufficient documentation

## 2021-05-14 DIAGNOSIS — Z86711 Personal history of pulmonary embolism: Secondary | ICD-10-CM | POA: Insufficient documentation

## 2021-05-14 DIAGNOSIS — Z9049 Acquired absence of other specified parts of digestive tract: Secondary | ICD-10-CM | POA: Insufficient documentation

## 2021-05-14 DIAGNOSIS — Z6841 Body Mass Index (BMI) 40.0 and over, adult: Secondary | ICD-10-CM | POA: Insufficient documentation

## 2021-05-14 DIAGNOSIS — K635 Polyp of colon: Secondary | ICD-10-CM | POA: Insufficient documentation

## 2021-05-14 DIAGNOSIS — Z09 Encounter for follow-up examination after completed treatment for conditions other than malignant neoplasm: Secondary | ICD-10-CM | POA: Diagnosis not present

## 2021-05-14 DIAGNOSIS — E119 Type 2 diabetes mellitus without complications: Secondary | ICD-10-CM | POA: Insufficient documentation

## 2021-05-14 DIAGNOSIS — Z1211 Encounter for screening for malignant neoplasm of colon: Secondary | ICD-10-CM | POA: Insufficient documentation

## 2021-05-14 DIAGNOSIS — K5010 Crohn's disease of large intestine without complications: Secondary | ICD-10-CM | POA: Insufficient documentation

## 2021-05-14 DIAGNOSIS — Z86718 Personal history of other venous thrombosis and embolism: Secondary | ICD-10-CM | POA: Insufficient documentation

## 2021-05-14 DIAGNOSIS — Z888 Allergy status to other drugs, medicaments and biological substances status: Secondary | ICD-10-CM | POA: Insufficient documentation

## 2021-05-14 DIAGNOSIS — D123 Benign neoplasm of transverse colon: Secondary | ICD-10-CM | POA: Diagnosis not present

## 2021-05-14 DIAGNOSIS — I10 Essential (primary) hypertension: Secondary | ICD-10-CM | POA: Insufficient documentation

## 2021-05-14 DIAGNOSIS — Z79899 Other long term (current) drug therapy: Secondary | ICD-10-CM | POA: Insufficient documentation

## 2021-05-14 DIAGNOSIS — Z7951 Long term (current) use of inhaled steroids: Secondary | ICD-10-CM | POA: Insufficient documentation

## 2021-05-14 DIAGNOSIS — G4730 Sleep apnea, unspecified: Secondary | ICD-10-CM | POA: Insufficient documentation

## 2021-05-14 DIAGNOSIS — E7800 Pure hypercholesterolemia, unspecified: Secondary | ICD-10-CM | POA: Insufficient documentation

## 2021-05-14 DIAGNOSIS — E669 Obesity, unspecified: Secondary | ICD-10-CM | POA: Insufficient documentation

## 2021-05-14 DIAGNOSIS — Z96641 Presence of right artificial hip joint: Secondary | ICD-10-CM | POA: Insufficient documentation

## 2021-05-14 DIAGNOSIS — Z880 Allergy status to penicillin: Secondary | ICD-10-CM | POA: Insufficient documentation

## 2021-05-14 HISTORY — PX: POLYPECTOMY: SHX5525

## 2021-05-14 HISTORY — PX: COLONOSCOPY WITH PROPOFOL: SHX5780

## 2021-05-14 LAB — GLUCOSE, CAPILLARY: Glucose-Capillary: 114 mg/dL — ABNORMAL HIGH (ref 70–99)

## 2021-05-14 LAB — HM COLONOSCOPY

## 2021-05-14 SURGERY — COLONOSCOPY WITH PROPOFOL
Anesthesia: General

## 2021-05-14 MED ORDER — PROPOFOL 500 MG/50ML IV EMUL
INTRAVENOUS | Status: DC | PRN
  Administered 2021-05-14: 100 ug/kg/min via INTRAVENOUS

## 2021-05-14 MED ORDER — LIDOCAINE HCL (CARDIAC) PF 100 MG/5ML IV SOSY
PREFILLED_SYRINGE | INTRAVENOUS | Status: DC | PRN
  Administered 2021-05-14: 50 mg via INTRAVENOUS

## 2021-05-14 MED ORDER — STERILE WATER FOR IRRIGATION IR SOLN
Status: DC | PRN
  Administered 2021-05-14: 1.5 mL

## 2021-05-14 MED ORDER — PHENYLEPHRINE 40 MCG/ML (10ML) SYRINGE FOR IV PUSH (FOR BLOOD PRESSURE SUPPORT)
PREFILLED_SYRINGE | INTRAVENOUS | Status: DC | PRN
  Administered 2021-05-14 (×3): 80 ug via INTRAVENOUS

## 2021-05-14 MED ORDER — PROPOFOL 10 MG/ML IV BOLUS
INTRAVENOUS | Status: DC | PRN
  Administered 2021-05-14: 100 mg via INTRAVENOUS
  Administered 2021-05-14: 40 mg via INTRAVENOUS
  Administered 2021-05-14: 50 mg via INTRAVENOUS
  Administered 2021-05-14: 40 mg via INTRAVENOUS

## 2021-05-14 MED ORDER — KETAMINE HCL 50 MG/5ML IJ SOSY
PREFILLED_SYRINGE | INTRAMUSCULAR | Status: AC
  Filled 2021-05-14: qty 5

## 2021-05-14 MED ORDER — KETAMINE HCL 10 MG/ML IJ SOLN
INTRAMUSCULAR | Status: DC | PRN
  Administered 2021-05-14 (×2): 20 mg via INTRAVENOUS
  Administered 2021-05-14: 10 mg via INTRAVENOUS

## 2021-05-14 MED ORDER — LACTATED RINGERS IV SOLN: INTRAVENOUS | Status: DC

## 2021-05-14 MED ORDER — PHENYLEPHRINE 40 MCG/ML (10ML) SYRINGE FOR IV PUSH (FOR BLOOD PRESSURE SUPPORT)
PREFILLED_SYRINGE | INTRAVENOUS | Status: AC
  Filled 2021-05-14: qty 10

## 2021-05-14 NOTE — Anesthesia Procedure Notes (Signed)
Date/Time: 05/14/2021 9:27 AM Performed by: Orlie Dakin, CRNA Pre-anesthesia Checklist: Patient identified, Emergency Drugs available, Suction available and Patient being monitored Patient Re-evaluated:Patient Re-evaluated prior to induction Oxygen Delivery Method: Nasal cannula Induction Type: IV induction Placement Confirmation: positive ETCO2

## 2021-05-14 NOTE — H&P (Signed)
[name] is an 55 y.o. female.   Chief Complaint: Patient is here for colonoscopy. HPI: Patient is 55 year old Caucasian female who was over 22-year history of Crohn's colitis who is here for surveillance colonoscopy.  Last exam was in 2016 revealing mild focal disease.  She is having formed stools.  She generally has 2 stools per day.  She denies abdominal pain or rectal bleeding.  Family history is negative for CRC.  She is not taking anticoagulant anymore.  Past Medical History:  Diagnosis Date   Anxiety    No medications currently   Arthritis    Asthma    Bilateral carpal tunnel syndrome    COVID 04/08/2020   VDRF ARDS   04/16/20-extubated 05/04/2020   Crohn disease ([place])    Diabetes mellitus    diet controlled   DVT (deep venous thrombosis) ([place]) 05/30/2020   Heart murmur    mild   High cholesterol    Hypertension    pcp  Dr Coralyn Mark Quillian Quince    [place]   IBS (irritable bowel syndrome)    Obesity    Pneumonia 2011   History of   Pulmonary embolism ([place]) 05/30/2020   DVT/PE    Sleep apnea 03-2009 study   pt forgot to mention at pre admit visit.  uses c-pap brought her own  machine from home.    Trigger thumb, right thumb     Past Surgical History:  Procedure Laterality Date   CARDIAC CATHETERIZATION  05/30/2020   Removal pulmonary embolus    [facility]   CARPAL TUNNEL RELEASE Right 09/20/2019   Procedure: CARPAL TUNNEL RELEASE;  Surgeon: Hiram Gash, MD;  Location: [facility];  Service: Orthopedics;  Laterality: Right;   CARPAL TUNNEL RELEASE Left 11/15/2019   Procedure: CARPAL TUNNEL RELEASE, EXCISION OF GANGLION CYST;  Surgeon: Hiram Gash, MD;  Location: [facility];  Service: Orthopedics;  Laterality: Left;   CHOLECYSTECTOMY N/A 03/31/2013   Procedure: LAPAROSCOPIC CHOLECYSTECTOMY;  Surgeon: Jamesetta So, MD;  Location: [facility];  Service: General;  Laterality: N/A;   COLONOSCOPY N/A 03/16/2013   Procedure: COLONOSCOPY;  Surgeon: Rogene Houston, MD;  Location: [facility];   Service: Endoscopy;  Laterality: N/A;  255   COLONOSCOPY N/A 12/31/2014   Procedure: COLONOSCOPY;  Surgeon: Rogene Houston, MD;  [facility]: [facility];  Service: Endoscopy;  Laterality: N/A;  730   CYST REMOVAL HAND     cyst removed from left hand     FLEXIBLE SIGMOIDOSCOPY N/A 08/21/2014   Procedure: FLEXIBLE SIGMOIDOSCOPY;  Surgeon: Rogene Houston, MD;  [facility]: [facility];  Service: Endoscopy;  Laterality: N/A;  [place] ARTHROPLASTY  04/18/2012   Procedure: TOTAL HIP ARTHROPLASTY;  Surgeon: Marybelle Killings, MD;  Location: [facility];  Service: Orthopedics;  Laterality: Right;  Right Total Hip Arthroplasty    History reviewed. No pertinent family history. Social History:  reports that she has never smoked. She has never used smokeless tobacco. She reports that she does not drink alcohol and does not use drugs.  Allergies:  Allergies  Allergen Reactions   Remicade [Infliximab] Swelling    Joints locked up   Cefprozil Rash   Penicillins Rash    Did it involve swelling of the face/tongue/throat, SOB, or low BP? No Did it involve sudden or severe rash/hives, skin peeling, or any reaction on the inside of your mouth or nose? No Did you need to seek medical  attention at a hospital or doctor's office? No When did it last happen?       If all above answers are "NO", may proceed with cephalosporin use.     Medications Prior to Admission  Medication Sig Dispense Refill   acetaminophen (TYLENOL) 500 MG tablet Take 1,000 mg by mouth every 8 (eight) hours as needed for moderate pain.     albuterol (PROVENTIL) (2.5 MG/3ML) 0.083% nebulizer solution Inhale 2.5 mg into the lungs daily as needed for shortness of breath or wheezing.     albuterol (VENTOLIN HFA) 108 (90 Base) MCG/ACT inhaler Inhale 2 puffs into the lungs every 4 (four) hours as needed for wheezing or shortness of breath.     Calcium Carb-Cholecalciferol (CALCIUM 600/VITAMIN D3 PO) Take 1 tablet by mouth  daily.     cetirizine (ZYRTEC) 10 MG tablet Take 10 mg by mouth every evening.      dicyclomine (BENTYL) 20 MG tablet TAKE ONE TABLET BY MOUTH THREE TIMES DAILY AS NEEDED FOR SPASMS. 90 tablet 3   DULoxetine (CYMBALTA) 60 MG capsule Take 1 capsule (60 mg total) by mouth daily. 5 capsule 0   fluticasone (FLONASE) 50 MCG/ACT nasal spray Place 1 spray into both nostrils daily as needed for allergies or rhinitis.     Fluticasone-Salmeterol (ADVAIR) 250-50 MCG/DOSE AEPB Inhale 1 puff into the lungs daily.     JARDIANCE 25 MG TABS tablet Take 25 mg by mouth daily.      lisinopril-hydrochlorothiazide (ZESTORETIC) 20-12.5 MG tablet Take 2 tablets by mouth daily.     MAGNESIUM OXIDE PO Take 1,000 mg by mouth at bedtime.     mercaptopurine (PURINETHOL) 50 MG tablet TAKE 1 AND 1/2 TABLETS BY MOUTH DAILY ON EMPTY STOMACH ONE HOUR PRIOR TO MEALS OR TWO HOURS AFTER MEALS. 45 tablet 5   mesalamine (PENTASA) 500 MG CR capsule TAKE TWO (2) CAPSULES BY MOUTH FOUR TIMES DAILY 240 capsule 11   Multiple Vitamin (MULITIVITAMIN WITH MINERALS) TABS Take 1 tablet by mouth every morning.      rosuvastatin (CRESTOR) 20 MG tablet Take 20 mg by mouth at bedtime.     traZODone (DESYREL) 50 MG tablet Take 1 tablet (50 mg total) by mouth at bedtime as needed for sleep. (Patient taking differently: Take 100 mg by mouth at bedtime as needed for sleep.) 5 tablet 0    Results for orders placed or performed during the hospital encounter of 05/14/21 (from the past 48 hour(s))  Glucose, capillary     Status: Abnormal   Collection Time: 05/14/21  7:42 AM  Result Value Ref Range   Glucose-Capillary 114 (H) 70 - 99 mg/dL    Comment: Glucose reference range applies only to samples taken after fasting for at least 8 hours.   No results found.  Review of Systems  Blood pressure (!) 143/71, temperature 98.4 F (36.9 C), temperature source Oral, SpO2 93 %. Physical Exam HENT:     Mouth/Throat:     Mouth: Mucous membranes are  moist.     Pharynx: Oropharynx is clear.  Eyes:     Comments: Xanthelasma at lower eyelids  Cardiovascular:     Rate and Rhythm: Normal rate and regular rhythm.     Heart sounds: Normal heart sounds. No murmur heard. Pulmonary:     Effort: Pulmonary effort is normal.     Breath sounds: Normal breath sounds.  Abdominal:     General: There is no distension.     Palpations: Abdomen is soft.  There is no mass.     Tenderness: There is no abdominal tenderness.  Musculoskeletal:        General: No swelling.  Lymphadenopathy:     Cervical: No cervical adenopathy.  Skin:    General: Skin is warm and dry.  Neurological:     Mental Status: She is alert.     Assessment/Plan  Crohn's colitis. Surveillance colonoscopy.  Hildred Laser, MD 05/14/2021, 9:24 AM

## 2021-05-14 NOTE — Discharge Instructions (Addendum)
No aspirin or NSAIDs for 24 hours. Resume usual medications and diet as before. No driving for 24 hours. Physician will call with biopsy results.

## 2021-05-14 NOTE — Op Note (Signed)
[facility] Patient Name: [name] Procedure Date: 05/14/2021 9:05 AM MRN: [id number] Date of Birth: 09-Oct-1965 Attending MD: Hildred Laser , MD CSN: [id number] Age: 55 Admit Type: Outpatient Procedure:                Colonoscopy Indications:              High risk colon cancer surveillance: Crohn's                            colitis of 8 (or more) years duration with                            one-third (or more) of the colon involved Providers:                Hildred Laser, MD, Lurline Del, RN, Casimer Bilis, Technician Referring MD:             Mitzie Na. Quillian Quince, MD Medicines:                Propofol per Anesthesia Complications:            No immediate complications. Estimated Blood Loss:     Estimated blood loss: none. Procedure:                Pre-Anesthesia Assessment:                           - Prior to the procedure, a History and Physical                            was performed, and patient medications and                            allergies were reviewed. The patient's tolerance of                            previous anesthesia was also reviewed. The risks                            and benefits of the procedure and the sedation                            options and risks were discussed with the patient.                            All questions were answered, and informed consent                            was obtained. Prior Anticoagulants: The patient has                            taken no previous anticoagulant or antiplatelet  agents. ASA Grade Assessment: III - A patient with                            severe systemic disease. After reviewing the risks                            and benefits, the patient was deemed in                            satisfactory condition to undergo the procedure.                           After obtaining informed consent, the colonoscope                             was passed under direct vision. Throughout the                            procedure, the patient's blood pressure, pulse, and                            oxygen saturations were monitored continuously. The                            [id number]-[id number] ([id number]) scope was introduced through                            the anus and advanced to the the cecum, identified                            by appendiceal orifice and ileocecal valve. The                            colonoscopy was somewhat difficult due to a                            redundant colon. The patient tolerated the                            procedure well. The quality of the bowel                            preparation was good. The ileocecal valve,                            appendiceal orifice, and rectum were photographed. Scope In: 9:32:21 AM Scope Out: 10:04:44 AM Scope Withdrawal Time: 0 hours 10 minutes 6 seconds  Total Procedure Duration: 0 hours 32 minutes 23 seconds  Findings:      The perianal and digital rectal examinations were normal.      Three polyps were found in the transverse colon and hepatic flexure. The       polyps were diminutive in size. These were biopsied with a cold forceps       for histology. The  pathology specimen was placed into Bottle Number 1.      A 1 mm polyp was found in the proximal transverse colon. The polyp was       removed with a cold snare. Resection and retrieval were complete. The       pathology specimen was placed into Bottle Number 1.      The exam was otherwise normal throughout the examined colon.      The retroflexed view of the distal rectum and anal verge was normal and       showed no anal or rectal abnormalities. Impression:               - Three diminutive polyps in the transverse colon                            and at the hepatic flexure. Biopsied.                           - One 1 mm polyp in the proximal transverse colon,                            removed with a cold  snare. Resected and retrieved. Moderate Sedation:      Per Anesthesia Care Recommendation:           - Patient has a contact number available for                            emergencies. The signs and symptoms of potential                            delayed complications were discussed with the                            patient. Return to normal activities tomorrow.                            Written discharge instructions were provided to the                            patient.                           - Resume previous diet today.                           - Continue present medications.                           - No aspirin, ibuprofen, naproxen, or other                            non-steroidal anti-inflammatory drugs for 1 day.                           - Await pathology results.                           -  Repeat colonoscopy in 5 years for surveillance. Procedure Code(s):        --- Professional ---                           [phone], Colonoscopy, flexible; with removal of                            tumor(s), polyp(s), or other lesion(s) by snare                            technique                           45380, 59, Colonoscopy, flexible; with biopsy,                            single or multiple Diagnosis Code(s):        --- Professional ---                           K63.5, Polyp of colon                           K50.10, Crohn's disease of large intestine without                            complications CPT copyright 2019 [employer]. All rights reserved. The codes documented in this report are preliminary and upon coder review may  be revised to meet current compliance requirements. Hildred Laser, MD Hildred Laser, MD 05/14/2021 10:11:55 AM This report has been signed electronically. Number of Addenda: 0

## 2021-05-14 NOTE — Anesthesia Postprocedure Evaluation (Signed)
Anesthesia Post Note  Patient: [name]  Procedure(s) Performed: COLONOSCOPY WITH PROPOFOL POLYPECTOMY  Patient location during evaluation: Phase II Anesthesia Type: General Level of consciousness: awake and alert and oriented Pain management: pain level controlled Vital Signs Assessment: post-procedure vital signs reviewed and stable Respiratory status: spontaneous breathing and respiratory function stable Cardiovascular status: blood pressure returned to baseline and stable Postop Assessment: no apparent nausea or vomiting Anesthetic complications: no   No notable events documented.   Last Vitals:  Vitals:   05/14/21 0754 05/14/21 1008  BP: (!) 143/71 107/64  Resp:  20  Temp: 36.9 C 36.9 C  SpO2: 93% 98%    Last Pain:  Vitals:   05/14/21 1008  TempSrc: Oral  PainSc: 0-No pain                 Paloma [place]ange C Janie Capp

## 2021-05-14 NOTE — Transfer of Care (Signed)
Immediate Anesthesia Transfer of Care Note  Patient: [name]  Procedure(s) Performed: COLONOSCOPY WITH PROPOFOL POLYPECTOMY  Patient Location: Short Stay  Anesthesia Type:General  Level of Consciousness: awake, alert  and oriented  Airway & Oxygen Therapy: Patient Spontanous Breathing  Post-op Assessment: Report given to RN, Post -op Vital signs reviewed and stable and Patient moving all extremities X 4  Post vital signs: Reviewed and stable  Last Vitals:  Vitals Value Taken Time  BP 107/64 05/14/21 1008  Temp 36.9 C 05/14/21 1008  Pulse    Resp 20 05/14/21 1008  SpO2 98 % 05/14/21 1008    Last Pain:  Vitals:   05/14/21 1008  TempSrc: Oral  PainSc: 0-No pain      Patients Stated Pain Goal: 1 (51/76/16 0737)  Complications: No notable events documented.

## 2021-05-14 NOTE — Anesthesia Preprocedure Evaluation (Signed)
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 on medications + DVT  + Valvular Problems/Murmurs  Rhythm:Regular Rate:Tachycardia + Systolic murmurs    Neuro/Psych Anxiety  Neuromuscular disease    GI/Hepatic Neg liver ROS, Bowel prep,Crohn's disease   Endo/Other  diabetes, Well Controlled, Type 2, Oral Hypoglycemic AgentsMorbid obesity  Renal/GU Renal disease     Musculoskeletal  (+) Arthritis ,   Abdominal   Peds  Hematology   Anesthesia Other Findings   Reproductive/Obstetrics                            Anesthesia Physical Anesthesia Plan  ASA: 3  Anesthesia Plan: General   Post-op Pain Management:    Induction: Intravenous  PONV Risk Score and Plan: TIVA  Airway Management Planned: Nasal Cannula, Natural Airway and Simple Face Mask  Additional Equipment:   Intra-op Plan:   Post-operative Plan:   Informed Consent: I have reviewed the patients History and Physical, chart, labs and discussed the procedure including the risks, benefits and alternatives for the proposed anesthesia with the patient or authorized representative who has indicated his/her understanding and acceptance.     Dental advisory given  Plan Discussed with: CRNA and Surgeon  Anesthesia Plan  Comments:         Anesthesia Quick Evaluation

## 2021-05-16 LAB — SURGICAL PATHOLOGY

## 2021-05-20 ENCOUNTER — Encounter (INDEPENDENT_AMBULATORY_CARE_PROVIDER_SITE_OTHER): Payer: Self-pay | Admitting: *Deleted

## 2021-05-20 ENCOUNTER — Encounter (HOSPITAL_COMMUNITY): Payer: Self-pay | Admitting: Internal Medicine

## 2021-06-12 ENCOUNTER — Encounter (INDEPENDENT_AMBULATORY_CARE_PROVIDER_SITE_OTHER): Payer: Self-pay

## 2021-06-18 NOTE — Progress Notes (Signed)
Please enter orders for PAT visit scheduled for 06-26-21.

## 2021-06-25 NOTE — Patient Instructions (Addendum)
DUE TO COVID-19 ONLY ONE VISITOR IS ALLOWED TO COME WITH YOU AND STAY IN THE WAITING ROOM ONLY DURING PRE OP AND PROCEDURE.   **NO VISITORS ARE ALLOWED IN THE SHORT STAY AREA OR RECOVERY ROOM!!**  IF YOU WILL BE ADMITTED INTO THE HOSPITAL YOU ARE ALLOWED ONLY TWO SUPPORT PEOPLE DURING VISITATION HOURS ONLY (7 AM -8PM)   The support person(s) must pass our screening, gel in and out, and wear a mask at all times, including in the patient's room. Patients must also wear a mask when staff or their support person are in the room. Visitors GUEST BADGE MUST BE WORN VISIBLY  One adult visitor may remain with you overnight and MUST be in the room by 8 P.M.  No visitors under the age of 36. Any visitor under the age of 14 must be accompanied by an adult.        Your procedure is scheduled on: 07/09/21   Report to [facility] Main Entrance    Report to admitting at: 12:00 PM   Call this number if you have problems the morning of surgery [phone]   Do not eat food :After Midnight.   May have liquids until : 11:30 AM   day of surgery  CLEAR LIQUID DIET  Foods Allowed                                                                     Foods Excluded  Water, Black Coffee and tea, regular and decaf                             liquids that you cannot  Plain Jell-O in any flavor  (No red)                                           see through such as: Fruit ices (not with fruit pulp)                                     milk, soups, orange juice              Iced Popsicles (No red)                                    All solid food                                   Apple juices Sports drinks like Gatorade (No red) Lightly seasoned clear broth or consume(fat free) Sugar  Sample Menu Breakfast                                Lunch  Supper Cranberry juice                    Beef broth                            Chicken broth Jell-O                                      Grape juice                           Apple juice Coffee or tea                        Jell-O                                      Popsicle                                                Coffee or tea                        Coffee or tea      Oral Hygiene is also important to reduce your risk of infection.                                    Remember - BRUSH YOUR TEETH THE MORNING OF SURGERY WITH YOUR REGULAR TOOTHPASTE   Do NOT smoke after Midnight   Take these medicines the morning of surgery with A SIP OF WATER: duloxetine.Use inhalers as usual.  How to Manage Your Diabetes Before and After Surgery  Why is it important to control my blood sugar before and after surgery? Improving blood sugar levels before and after surgery helps healing and can limit problems. A way of improving blood sugar control is eating a healthy diet by:  Eating less sugar and carbohydrates  Increasing activity/exercise  Talking with your doctor about reaching your blood sugar goals High blood sugars (greater than 180 mg/dL) can raise your risk of infections and slow your recovery, so you will need to focus on controlling your diabetes during the weeks before surgery. Make sure that the doctor who takes care of your diabetes knows about your planned surgery including the date and location.  How do I manage my blood sugar before surgery? Check your blood sugar at least 4 times a day, starting 2 days before surgery, to make sure that the level is not too high or low. Check your blood sugar the morning of your surgery when you wake up and every 2 hours until you get to the Short Stay unit. If your blood sugar is less than 70 mg/dL, you will need to treat for low blood sugar: Do not take insulin. Treat a low blood sugar (less than 70 mg/dL) with  cup of clear juice (cranberry or apple), 4 glucose tablets, OR glucose gel. Recheck blood sugar in 15 minutes after treatment (to make sure it is greater than  70  mg/dL). If your blood sugar is not greater than 70 mg/dL on recheck, call [phone] for further instructions. Report your blood sugar to the short stay nurse when you get to Short Stay.  If you are admitted to the hospital after surgery: Your blood sugar will be checked by the staff and you will probably be given insulin after surgery (instead of oral diabetes medicines) to make sure you have good blood sugar levels. The goal for blood sugar control after surgery is 80-180 mg/dL.   WHAT DO I DO ABOUT MY DIABETES MEDICATION?  Do not take oral diabetes medicines (pills) the morning of surgery.  THE DAY BEFORE SURGERY, DO NOT take jardiance.      THE MORNING OF SURGERY, DO NOT TAKE ANY ORAL DIABETIC MEDICATIONS DAY OF YOUR SURGERY                              You may not have any metal on your body including hair pins, jewelry, and body piercing             Do not wear make-up, lotions, powders, perfumes/cologne, or deodorant  Do not wear nail polish including gel and S&S, artificial/acrylic nails, or any other type of covering on natural nails including finger and toenails. If you have artificial nails, gel coating, etc. that needs to be removed by a nail salon please have this removed prior to surgery or surgery may need to be canceled/ delayed if the surgeon/ anesthesia feels like they are unable to be safely monitored.   Do not shave  48 hours prior to surgery.    Do not bring valuables to the hospital. [place].   Contacts, dentures or bridgework may not be worn into surgery.   Bring small overnight bag day of surgery.    Patients discharged on the day of surgery will not be allowed to drive home.   Special Instructions: Bring a copy of your healthcare power of attorney and living will documents         the day of surgery if you haven't scanned them before.              Please read over the following fact sheets you were  given: IF YOU HAVE QUESTIONS ABOUT YOUR PRE-OP INSTRUCTIONS PLEASE CALL [phone]     [facility] - Preparing for Surgery Before surgery, you can play an important role.  Because skin is not sterile, your skin needs to be as free of germs as possible.  You can reduce the number of germs on your skin by washing with CHG (chlorahexidine gluconate) soap before surgery.  CHG is an antiseptic cleaner which kills germs and bonds with the skin to continue killing germs even after washing. Please DO NOT use if you have an allergy to CHG or antibacterial soaps.  If your skin becomes reddened/irritated stop using the CHG and inform your nurse when you arrive at Short Stay. Do not shave (including legs and underarms) for at least 48 hours prior to the first CHG shower.  You may shave your face/neck. Please follow these instructions carefully:  1.  Shower with CHG Soap the night before surgery and the  morning of Surgery.  2.  If you choose to wash your hair, wash your hair first as usual with your  normal  shampoo.  3.  After you shampoo, rinse your hair and body thoroughly to remove the  shampoo.                           4.  Use CHG as you would any other liquid soap.  You can apply chg directly  to the skin and wash                       Gently with a scrungie or clean washcloth.  5.  Apply the CHG Soap to your body ONLY FROM THE NECK DOWN.   Do not use on face/ open                           Wound or open sores. Avoid contact with eyes, ears mouth and genitals (private parts).                       Wash face,  Genitals (private parts) with your normal soap.             6.  Wash thoroughly, paying special attention to the area where your surgery  will be performed.  7.  Thoroughly rinse your body with warm water from the neck down.  8.  DO NOT shower/wash with your normal soap after using and rinsing off  the CHG Soap.                9.  Pat yourself dry with a clean towel.            10.  Wear clean  pajamas.            11.  Place clean sheets on your bed the night of your first shower and do not  sleep with pets. Day of Surgery : Do not apply any lotions/deodorants the morning of surgery.  Please wear clean clothes to the hospital/surgery center.  FAILURE TO FOLLOW THESE INSTRUCTIONS MAY RESULT IN THE CANCELLATION OF YOUR SURGERY PATIENT SIGNATURE_________________________________  NURSE SIGNATURE__________________________________  ________________________________________________________________________  [facility]- Preparing for Total Shoulder Arthroplasty    Before surgery, you can play an important role. Because skin is not sterile, your skin needs to be as free of germs as possible. You can reduce the number of germs on your skin by using the following products. Benzoyl Peroxide Gel Reduces the number of germs present on the skin Applied twice a day to shoulder area starting two days before surgery    ==================================================================  Please follow these instructions carefully:  BENZOYL PEROXIDE 5% GEL  Please do not use if you have an allergy to benzoyl peroxide.   If your skin becomes reddened/irritated stop using the benzoyl peroxide.  Starting two days before surgery, apply as follows: Apply benzoyl peroxide in the morning and at night. Apply after taking a shower. If you are not taking a shower clean entire shoulder front, back, and side along with the armpit with a clean wet washcloth.  Place a quarter-sized dollop on your shoulder and rub in thoroughly, making sure to cover the front, back, and side of your shoulder, along with the armpit.   2 days before ____ AM   ____ PM              1 day before ____ AM   ____ PM  Do this twice a day for two days.  (Last application is the night before surgery, AFTER using the CHG soap as described below).  Do NOT apply benzoyl peroxide gel on the day of surgery.

## 2021-06-26 ENCOUNTER — Encounter (HOSPITAL_COMMUNITY): Payer: Self-pay

## 2021-06-26 ENCOUNTER — Other Ambulatory Visit: Payer: Self-pay

## 2021-06-26 ENCOUNTER — Encounter (HOSPITAL_COMMUNITY)
Admission: RE | Admit: 2021-06-26 | Discharge: 2021-06-26 | Disposition: A | Discharge status: Home / Self Care | Payer: BC Managed Care – PPO | Source: Ambulatory Visit | Attending: Orthopaedic Surgery | Admitting: Orthopaedic Surgery

## 2021-06-26 VITALS — BP 150/98 | HR 100 | Temp 98.5°F | Ht 63.0 in | Wt 296.0 lb

## 2021-06-26 DIAGNOSIS — Z7984 Long term (current) use of oral hypoglycemic drugs: Secondary | ICD-10-CM | POA: Diagnosis not present

## 2021-06-26 DIAGNOSIS — Z86718 Personal history of other venous thrombosis and embolism: Secondary | ICD-10-CM | POA: Diagnosis not present

## 2021-06-26 DIAGNOSIS — J45909 Unspecified asthma, uncomplicated: Secondary | ICD-10-CM | POA: Insufficient documentation

## 2021-06-26 DIAGNOSIS — M65312 Trigger thumb, left thumb: Secondary | ICD-10-CM | POA: Insufficient documentation

## 2021-06-26 DIAGNOSIS — Z7951 Long term (current) use of inhaled steroids: Secondary | ICD-10-CM | POA: Diagnosis not present

## 2021-06-26 DIAGNOSIS — Z79899 Other long term (current) drug therapy: Secondary | ICD-10-CM | POA: Insufficient documentation

## 2021-06-26 DIAGNOSIS — Z01812 Encounter for preprocedural laboratory examination: Secondary | ICD-10-CM | POA: Diagnosis not present

## 2021-06-26 DIAGNOSIS — M19012 Primary osteoarthritis, left shoulder: Secondary | ICD-10-CM | POA: Diagnosis not present

## 2021-06-26 DIAGNOSIS — I152 Hypertension secondary to endocrine disorders: Secondary | ICD-10-CM | POA: Insufficient documentation

## 2021-06-26 DIAGNOSIS — Z8616 Personal history of COVID-19: Secondary | ICD-10-CM | POA: Diagnosis not present

## 2021-06-26 DIAGNOSIS — Z01818 Encounter for other preprocedural examination: Secondary | ICD-10-CM

## 2021-06-26 DIAGNOSIS — E119 Type 2 diabetes mellitus without complications: Secondary | ICD-10-CM

## 2021-06-26 DIAGNOSIS — Z7901 Long term (current) use of anticoagulants: Secondary | ICD-10-CM | POA: Diagnosis not present

## 2021-06-26 DIAGNOSIS — E1159 Type 2 diabetes mellitus with other circulatory complications: Secondary | ICD-10-CM | POA: Diagnosis not present

## 2021-06-26 LAB — CBC
HCT: 44.5 % (ref 36.0–46.0)
Hemoglobin: 14.4 g/dL (ref 12.0–15.0)
MCH: 33.7 pg (ref 26.0–34.0)
MCHC: 32.4 g/dL (ref 30.0–36.0)
MCV: 104.2 fL — ABNORMAL HIGH (ref 80.0–100.0)
Platelets: 262 10*3/uL (ref 150–400)
RBC: 4.27 MIL/uL (ref 3.87–5.11)
RDW: 12.3 % (ref 11.5–15.5)
WBC: 6.6 10*3/uL (ref 4.0–10.5)
nRBC: 0 % (ref 0.0–0.2)

## 2021-06-26 LAB — HEMOGLOBIN A1C
Hgb A1c MFr Bld: 6 % — ABNORMAL HIGH (ref 4.8–5.6)
Mean Plasma Glucose: 125.5 mg/dL

## 2021-06-26 LAB — BASIC METABOLIC PANEL
Anion gap: 4 — ABNORMAL LOW (ref 5–15)
BUN: 17 mg/dL (ref 6–20)
CO2: 30 mmol/L (ref 22–32)
Calcium: 9.3 mg/dL (ref 8.9–10.3)
Chloride: 106 mmol/L (ref 98–111)
Creatinine, Ser: 0.59 mg/dL (ref 0.44–1.00)
GFR, Estimated: >60 mL/min (ref >60)
Glucose, Bld: 135 mg/dL — ABNORMAL HIGH (ref 70–99)
Potassium: 3.9 mmol/L (ref 3.5–5.1)
Sodium: 140 mmol/L (ref 135–145)

## 2021-06-26 LAB — GLUCOSE, CAPILLARY: Glucose-Capillary: 127 mg/dL — ABNORMAL HIGH (ref 70–99)

## 2021-06-26 LAB — SURGICAL PCR SCREEN
MRSA, PCR: NEGATIVE
Staphylococcus aureus: NEGATIVE

## 2021-06-26 NOTE — Progress Notes (Addendum)
COVID Vaccine Completed: Yes Date COVID Vaccine completed: 05/2020 x 2 COVID vaccine manufacturer:  Moderna    COVID Test: N/A PCP - dr. Gar Ponto Cardiologist - Dr. Alison Stalling. LOV: 06/06/21  Chest x-ray -  EKG - 05/12/21 Stress Test -  ECHO - 04/24/20 Cardiac Cath -  Pacemaker/ICD device last checked:  Sleep Study - Yes CPAP - Yes  Fasting Blood Sugar - 100's Checks Blood Sugar __1___ times a day  Blood Thinner Instructions: Aspirin Instructions: Last Dose:  Anesthesia review: Hx: DIA,HTN,DVT,PE,heart murmur,OSA(CPAP)  Patient denies shortness of breath, fever, cough and chest pain at PAT appointment   Patient verbalized understanding of instructions that were given to them at the PAT appointment. Patient was also instructed that they will need to review over the PAT instructions again at home before surgery.

## 2021-06-27 NOTE — Progress Notes (Signed)
Anesthesia Chart Review   Case: [id number] Date/Time: 07/09/21 1415   Procedures:      TOTAL SHOULDER ARTHROPLASTY (Left: Shoulder)     RELEASE TRIGGER FINGER/A-1 PULLEY (Left)   Anesthesia type: Choice   Pre-op diagnosis: djd left shoulder, left trigger thumb   Location: [facility] ROOM 06 / [facility]   Surgeons: Hiram Gash, MD       DISCUSSION:55 y.o. never smoker with h/o HTN, DM II (A1C 6.0), asthma, sleep apnea, PE, DVT, djd left shoulder, left trigger thumb scheduled for above procedure 07/09/2021 with Dr. Ophelia Charter.   Pt last seen by cardiology 06/06/2021. Per OV note, "Doing well. Stable. No longer on AC. Followed by heme and pulm. Going for ortho surgery. No further cardiovascular testing needed prior to orthopedic surgery."  Anticipate pt can proceed with planned procedure barring acute status change.   VS: BP (!) 150/98   Pulse 100   Temp 36.9 C (Oral)   Ht 5' 3"  (1.6 m)   Wt 134.3 kg   SpO2 98%   BMI 52.43 kg/m   PROVIDERS: Caryl Bis, MD is PCP   Cecille Po, MD is Cardiologist  LABS: Labs reviewed: Acceptable for surgery. (all labs ordered are listed, but only abnormal results are displayed)  Labs Reviewed  CBC - Abnormal; Notable for the following components:      Result Value   MCV 104.2 (*)    All other components within normal limits  BASIC METABOLIC PANEL - Abnormal; Notable for the following components:   Glucose, Bld 135 (*)    Anion gap 4 (*)    All other components within normal limits  HEMOGLOBIN A1C - Abnormal; Notable for the following components:   Hgb A1c MFr Bld 6.0 (*)    All other components within normal limits  GLUCOSE, CAPILLARY - Abnormal; Notable for the following components:   Glucose-Capillary 127 (*)    All other components within normal limits  SURGICAL PCR SCREEN     IMAGES:   EKG: 05/12/2021 Rate 107 bpm  Sinus tachycardia Cannot rule out anterior infarct, age undetermined   CV: Echo:  TTE 02/04/21: Summary 1.  The left ventricle is normal in size with normal wall thickness. 2. The left ventricular systolic function is normal, LVEF is visually estimated at 60-65%. 3. The right ventricle is normal in size, with normal systolic function. 4. Pulmonary systolic pressure cannot be estimated due to insufficient TR Jet.  CATH:  RHC w/ Intervention 05/20/20 Summary Diagnostic Findings: 1. Large bilateral pulmonary emboli with a mean PA pressure of 38 mmHg 2. Successful retreival of large bilateral thrombi with improvement in oxygen saturation to 100% from 93%, reduction in mean PA pressure from 38 mmHg to 20 mmHg, reduced HR from 130 to 110, and subjective improvement in respiratory effort.    Echo 04/24/2020 1. Left ventricular ejection fraction, by estimation, is 55 to 60%. The  left ventricle has normal function. Left ventricular endocardial border  not optimally defined to evaluate regional wall motion. Left ventricular  diastolic parameters are consistent  with Grade I diastolic dysfunction (impaired relaxation).   2. Right ventricular systolic function is normal. The right ventricular  size is normal. Tricuspid regurgitation signal is inadequate for assessing  PA pressure.   3. The mitral valve is grossly normal. No evidence of mitral valve  regurgitation. No evidence of mitral stenosis.   4. The aortic valve is grossly normal. Aortic valve regurgitation is not  visualized. No aortic  stenosis is present.  Past Medical History:  Diagnosis Date   Anxiety    No medications currently   Arthritis    Asthma    Bilateral carpal tunnel syndrome    COVID 04/08/2020   VDRF ARDS   04/16/20-extubated 05/04/2020   Crohn disease ([place])    Diabetes mellitus    diet controlled   DVT (deep venous thrombosis) ([place]) 05/30/2020   Heart murmur    mild   High cholesterol    Hypertension    pcp  Dr Coralyn Mark Qu[place]    [place]   IBS (irritable bowel syndrome)    Obesity    Pneumonia 2011   History of   Pulmonary  embolism ([place]) 05/30/2020   DVT/PE    Sleep apnea 03-2009 study   pt forgot to mention at pre admit visit.  uses c-pap brought her own  machine from home.    Trigger thumb, right thumb     Past Surgical History:  Procedure Laterality Date   CARDIAC CATHETERIZATION  05/30/2020   Removal pulmonary embolus    [facility]   CARPAL TUNNEL RELEASE Right 09/20/2019   Procedure: CARPAL TUNNEL RELEASE;  Surgeon: Hiram Gash, MD;  Location[facility];  Service: Orthopedics;  Laterality: Right;   CARPAL TUNNEL RELEASE Left 11/15/2019   Procedure: CARPAL TUNNEL RELEASE, EXCISION OF GANGLION CYST;  Surgeon: Hiram Gash, MD;  [facility];  Service: Orthopedics;  Laterality: Left;   CHOLECYSTECTOMY N/A 03/31/2013   Procedure: LAPAROSCOPIC CHOLECYSTECTOMY;  Surgeon: Jamesetta So, MD;  Location: [facility];  Service: General;  Laterality: N/A;   COLONOSCOPY N/A 03/16/2013   Procedure: COLONOSCOPY;  Surgeon: Rogene Houston, MD;  [facility];  Service: Endoscopy;  Laterality: N/A;  255   COLONOSCOPY N/A 12/31/2014   Procedure: COLONOSCOPY;  Surgeon: Rogene Houston, MD;  [facility];  Service: Endoscopy;  Laterality: N/A;  730   COLONOSCOPY WITH PROPOFOL N/A 05/14/2021   Procedure: COLONOSCOPY WITH PROPOFOL;  Surgeon: Rogene Houston, MD;  [facility];  Service: Endoscopy;  Laterality: N/A;  915   CYST REMOVAL HAND     cyst removed from left hand     FLEXIBLE SIGMOIDOSCOPY N/A 08/21/2014   Procedure: FLEXIBLE SIGMOIDOSCOPY;  Surgeon: Rogene Houston, MD;  [facility];  Service: Endoscopy;  Laterality: N/A;  1030   POLYPECTOMY  05/14/2021   Procedure: POLYPECTOMY;  Surgeon: Rogene Houston, MD;  [facility];  Service: Endoscopy;;   TONSILLECTOMY     TOTAL HIP ARTHROPLASTY  04/18/2012   Procedure: TOTAL HIP ARTHROPLASTY;  Surgeon: Marybelle Killings, MD;  Location: [place];  Service: Orthopedics;  Laterality: Right;  Right Total Hip Arthroplasty     MEDICATIONS:  acetaminophen (TYLENOL) 500 MG tablet   albuterol (PROVENTIL) (2.5 MG/3ML) 0.083% nebulizer solution   albuterol (VENTOLIN HFA) 108 (90 Base) MCG/ACT inhaler   Calcium Carb-Cholecalciferol (CALCIUM 600/VITAMIN D3 PO)   cetirizine (ZYRTEC) 10 MG tablet   dicyclomine (BENTYL) 20 MG tablet   DULoxetine (CYMBALTA) 60 MG capsule   fluticasone (FLONASE) 50 MCG/ACT nasal spray   Fluticasone-Salmeterol (ADVAIR) 250-50 MCG/DOSE AEPB   gabapentin (NEURONTIN) 600 MG tablet   JARDIANCE 25 MG TABS tablet   lisinopril-hydrochlorothiazide (ZESTORETIC) 20-12.5 MG tablet   MAGNESIUM OXIDE PO   mercaptopurine (PURINETHOL) 50 MG tablet   mesalamine (PENTASA) 500 MG CR capsule   Multiple Vitamin (MULITIVITAMIN WITH MINERALS) TABS   Probiotic Product (PROBIOTIC DAILY PO)  rosuvastatin (CRESTOR) 20 MG tablet   traZODone (DESYREL) 50 MG tablet   No current facility-administered medications for this encounter.    Konrad Felix Ward, PA-C [facility] [phone]

## 2021-06-27 NOTE — Anesthesia Preprocedure Evaluation (Addendum)
Anesthesia Evaluation  Patient identified by MRN, date of birth, ID band Patient awake    Reviewed: Allergy & Precautions, NPO status , Patient's Chart, lab work & pertinent test results  Airway Mallampati: IV  TM Distance: >3 FB Neck ROM: Full    Dental no notable dental hx.    Pulmonary asthma , sleep apnea and Continuous Positive Airway Pressure Ventilation , PE   Pulmonary exam normal breath sounds clear to auscultation       Cardiovascular hypertension, Pt. on medications + DVT   Rhythm:Regular Rate:Tachycardia  ECG: ST   Neuro/Psych Anxiety  Neuromuscular disease    GI/Hepatic Neg liver ROS, Crohn disease IBS (irritable bowel syndrome)   Endo/Other  diabetes, Oral Hypoglycemic AgentsMorbid obesity  Renal/GU negative Renal ROS     Musculoskeletal  (+) Arthritis ,   Abdominal (+) + obese,   Peds  Hematology [facility]   Anesthesia Other Findings   Reproductive/Obstetrics                           Anesthesia Physical Anesthesia Plan  ASA: 4  Anesthesia Plan: General and Regional   Post-op Pain Management: GA combined w/ Regional for post-op pain   Induction: Intravenous  PONV Risk Score and Plan: 3 and Ondansetron, Dexamethasone, Midazolam and Treatment may vary due to age or medical condition  Airway Management Planned: Oral ETT  Additional Equipment:   Intra-op Plan:   Post-operative Plan: Extubation in OR  Informed Consent: I have reviewed the patients History and Physical, chart, labs and discussed the procedure including the risks, benefits and alternatives for the proposed anesthesia with the patient or authorized representative who has indicated his/her understanding and acceptance.     Dental advisory given  Plan Discussed with: CRNA  Anesthesia Plan Comments: (Reviewed PAT note 06/26/2021, Konrad Felix Ward, PA-C)      Anesthesia Quick Evaluation

## 2021-07-01 ENCOUNTER — Other Ambulatory Visit (INDEPENDENT_AMBULATORY_CARE_PROVIDER_SITE_OTHER): Payer: Self-pay | Admitting: Gastroenterology

## 2021-07-01 NOTE — Telephone Encounter (Signed)
Last seen 04/03/20 and has appt in march 2023

## 2021-07-02 ENCOUNTER — Ambulatory Visit: Payer: BC Managed Care – PPO | Admitting: Physician Assistant

## 2021-07-02 NOTE — H&P (Signed)
PREOPERATIVE H&P  Chief Complaint: djd left shoulder, left trigger thumb  HPI: [name] is a 55 y.o. female who is scheduled for Procedure(s): TOTAL SHOULDER ARTHROPLASTY RELEASE TRIGGER FINGER/A-1 PULLEY.   Patient has a past medical history significant for Crohn's disease, DM, DVT, IBS, HTN, PE.   She is a 55 year old who had carpal tunnel releases in the past with Dr. Griffin Basil. 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 She also has a left trigger thumb. We have done a trigger release on her in the past as well. She wants to consider her options. She had an injection recently. She has a pertinent significant medical history with COVID, she was on the ventilator for 14 days, she had DVT's associated with this. She is off her Xarelto now.   Her symptoms are rated as moderate to severe, and have been worsening.  This is significantly impairing activities of daily living.    Please see clinic note for further details on this patient's care.    She has elected for surgical management.   Past Medical History:  Diagnosis Date   Anxiety    No medications currently   Arthritis    Asthma    Bilateral carpal tunnel syndrome    COVID 04/08/2020   VDRF ARDS   04/16/20-extubated 05/04/2020   Crohn disease ([place])    Diabetes mellitus    diet controlled   DVT (deep venous thrombosis) ([place]) 05/30/2020   Heart murmur    mild   High cholesterol    Hypertension    pcp  Dr Coralyn Mark Quillian Quince    [place]   IBS (irritable bowel syndrome)    Obesity    Pneumonia 2011   History of   Pulmonary embolism ([place]) 05/30/2020   DVT/PE    Sleep apnea 03-2009 study   pt forgot to mention at pre admit visit.  uses c-pap brought her own  machine from home.    Trigger thumb, right thumb    Past Surgical History:  Procedure Laterality Date   CARDIAC CATHETERIZATION  05/30/2020   Removal pulmonary embolus    [facility]   CARPAL TUNNEL RELEASE Right 09/20/2019   Procedure:  CARPAL TUNNEL RELEASE;  Surgeon: Hiram Gash, MD;  Location: [facility];  Service: Orthopedics;  Laterality: Right;   CARPAL TUNNEL RELEASE Left 11/15/2019   Procedure: CARPAL TUNNEL RELEASE, EXCISION OF GANGLION CYST;  Surgeon: Hiram Gash, MD;  Location: [facility];  Service: Orthopedics;  Laterality: Left;   CHOLECYSTECTOMY N/A 03/31/2013   Procedure: LAPAROSCOPIC CHOLECYSTECTOMY;  Surgeon: Jamesetta So, MD;  Location: [facility];  Service: General;  Laterality: N/A;   COLONOSCOPY N/A 03/16/2013   Procedure: COLONOSCOPY;  Surgeon: Rogene Houston, MD;  Location: [facility];  Service: Endoscopy;  Laterality: N/A;  255   COLONOSCOPY N/A 12/31/2014   Procedure: COLONOSCOPY;  Surgeon: Rogene Houston, MD;  [facility]: [facility];  Service: Endoscopy;  Laterality: N/A;  730   COLONOSCOPY WITH PROPOFOL N/A 05/14/2021   Procedure: COLONOSCOPY WITH PROPOFOL;  Surgeon: Rogene Houston, MD;  [facility];  Service: Endoscopy;  Laterality: N/A;  915   CYST REMOVAL HAND     cyst removed from left hand     FLEXIBLE SIGMOIDOSCOPY N/A 08/21/2014   Procedure: FLEXIBLE SIGMOIDOSCOPY;  Surgeon: Rogene Houston, MD;  [facility];  Service: Endoscopy;  Laterality: N/A;  1030   POLYPECTOMY  05/14/2021  Procedure: POLYPECTOMY;  Surgeon: Rogene Houston, MD;  [facility];  Service: Endoscopy;;   TONSILLECTOMY     TOTAL HIP ARTHROPLASTY  04/18/2012   Procedure: TOTAL HIP ARTHROPLASTY;  Surgeon: Marybelle Killings, MD;  Location: [place];  Service: Orthopedics;  Laterality: Right;  Right Total Hip Arthroplasty   Social History   Socioeconomic History   Marital status: Married    Spouse name: Not on file   Number of children: Not on file   Years of education: Not on file   Highest education level: Not on file  Occupational History   Not on file  Tobacco Use   Smoking status: Never   Smokeless tobacco: Never  Vaping Use   Vaping Use: Never used  Substance and Sexual Activity    Alcohol use: No    Alcohol/week: 0.0 standard drinks   Drug use: No   Sexual activity: Yes    Birth control/protection: Post-menopausal  Other Topics Concern   Not on file  Social History Narrative   Not on file   Social Determinants of Health   Financial Resource Strain: Not on file  Food Insecurity: Not on file  Transportation Needs: Not on file  Physical Activity: Not on file  Stress: Not on file  Social Connections: Not on file   No family history on file. Allergies  Allergen Reactions   Remicade [Infliximab] Swelling    Joints locked up   Cefprozil Rash   Penicillins Rash    Did it involve swelling of the face/tongue/throat, SOB, or low BP? No Did it involve sudden or severe rash/hives, skin peeling, or any reaction on the inside of your mouth or nose? No Did you need to seek medical attention at a hospital or doctor's office? No When did it last happen?       If all above answers are "NO", may proceed with cephalosporin use.    Prior to Admission medications   Medication Sig Start Date End Date Taking? Authorizing Provider  acetaminophen (TYLENOL) 500 MG tablet Take 1,000 mg by mouth every 8 (eight) hours as needed for moderate pain.   Yes [provider]  albuterol (PROVENTIL) (2.5 MG/3ML) 0.083% nebulizer solution Inhale 2.5 mg into the lungs daily as needed for shortness of breath or wheezing. 11/20/20  Yes [provider]  albuterol (VENTOLIN HFA) 108 (90 Base) MCG/ACT inhaler Inhale 2 puffs into the lungs every 4 (four) hours as needed for wheezing or shortness of breath. 04/08/20  Yes [provider]  Calcium Carb-Cholecalciferol (CALCIUM 600/VITAMIN D3 PO) Take 1,200 mg by mouth daily.   Yes [provider]  cetirizine (ZYRTEC) 10 MG tablet Take 10 mg by mouth at bedtime.   Yes [provider]  DULoxetine (CYMBALTA) 60 MG capsule Take 1 capsule (60 mg total) by mouth daily. Patient taking differently: Take 120 mg by mouth  daily. 05/14/20  Yes Nita Sells, MD  fluticasone (FLONASE) 50 MCG/ACT nasal spray Place 1 spray into both nostrils daily as needed for allergies or rhinitis.   Yes [provider]  Fluticasone-Salmeterol (ADVAIR) 250-50 MCG/DOSE AEPB Inhale 1 puff into the lungs daily.   Yes [provider]  gabapentin (NEURONTIN) 600 MG tablet Take 1,200 mg by mouth at bedtime. 06/05/21  Yes [provider]  JARDIANCE 25 MG TABS tablet Take 25 mg by mouth daily.  09/14/14  Yes [provider]  lisinopril-hydrochlorothiazide (ZESTORETIC) 20-12.5 MG tablet Take 2 tablets by mouth daily.   Yes  [provider]  MAGNESIUM OXIDE PO Take 1,000 mg by mouth at bedtime.   Yes [provider]  mercaptopurine (PURINETHOL) 50 MG tablet TAKE 1 AND 1/2 TABLETS BY MOUTH DAILY ON EMPTY STOMACH ONE HOUR PRIOR TO MEALS OR TWO HOURS AFTER MEALS. 09/02/20  Yes Montez Morita, Daniel, MD  mesalamine (PENTASA) 500 MG CR capsule TAKE TWO (2) CAPSULES BY MOUTH FOUR TIMES DAILY 12/09/20  Yes [name], Mechele Dawley, MD  Multiple Vitamin (MULITIVITAMIN WITH MINERALS) TABS Take 1 tablet by mouth every morning.    Yes [provider]  Probiotic Product (PROBIOTIC DAILY PO) Take 200 mg by mouth daily.   Yes [provider]  rosuvastatin (CRESTOR) 20 MG tablet Take 20 mg by mouth at bedtime. 05/02/21  Yes [provider]  traZODone (DESYREL) 50 MG tablet Take 1 tablet (50 mg total) by mouth at bedtime as needed for sleep. Patient taking differently: Take 100 mg by mouth at bedtime as needed for sleep. 05/14/20  Yes Nita Sells, MD  dicyclomine (BENTYL) 20 MG tablet TAKE ONE TABLET BY MOUTH THREE TIMES DAILY AS NEEDED FOR SPASMS. 07/01/21   Rogene Houston, MD    ROS: All other systems have been reviewed and were otherwise negative with the exception of those mentioned in the HPI and as above.  Physical Exam: General: Alert, no acute  distress Cardiovascular: No pedal edema Respiratory: No cyanosis, no use of accessory musculature GI: No organomegaly, abdomen is soft and non-tender Skin: No lesions in the area of chief complaint Neurologic: Sensation intact distally Psychiatric: Patient is competent for consent with normal mood and affect Lymphatic: No axillary or cervical lymphadenopathy  MUSCULOSKELETAL:  Trigger thumb is noted on the left thumb.   The left shoulder has a range of motion of 120, passive 140. External rotation  to 10 versus 30. Internal rotation to the back pocket. Cuff strength is 5/5.   Imaging: Left shoulder X-rays show primary glenohumeral arthritis is noted.  End stage bone on bone.    Hand X-rays demonstrate no obvious acute osseous abnormality.  Assessment: djd left shoulder, left trigger thumb  Plan: Plan for Procedure(s): TOTAL SHOULDER ARTHROPLASTY RELEASE TRIGGER FINGER/A-1 PULLEY  The risks benefits and alternatives were discussed with the patient including but not limited to the risks of nonoperative treatment, versus surgical intervention including infection, bleeding, nerve injury,  blood clots, cardiopulmonary complications, morbidity, mortality, among others, and they were willing to proceed.   We additionally specifically discussed risks of axillary nerve injury, infection, periprosthetic fracture, continued pain and longevity of implants prior to beginning procedure.    Patient will be closely monitored in PACU for medical stabilization and pain control. If found stable in PACU, patient may be discharged home with outpatient follow-up. If any concerns regarding patient's stabilization patient will be admitted for observation after surgery. The patient is planning to be discharged home with outpatient PT.   The patient acknowledged the explanation, agreed to proceed with the plan and consent was signed.   Operative Plan: left anatomic total shoulder arthroplasty and left thumb  trigger release Discharge Medications: Standard DVT Prophylaxis: Xarelto Physical Therapy: Outpatient PT Special Discharge needs: Sling. [place], PA-C  07/02/2021 4:45 PM

## 2021-07-08 NOTE — Progress Notes (Signed)
Pt aware to arrive at [facility] admitting at 11:30 am on Wednesday 07/09/2021 for scheduled surgical procedure. No food after midnight; clear liquids from midnight till 11:00 am then nothing by mouth.

## 2021-07-09 ENCOUNTER — Encounter (HOSPITAL_COMMUNITY)
Admission: RE | Disposition: A | Discharge status: Home / Self Care | Payer: Self-pay | Source: Ambulatory Visit | Attending: Orthopaedic Surgery

## 2021-07-09 ENCOUNTER — Ambulatory Visit (HOSPITAL_COMMUNITY)
Admission: RE | Admit: 2021-07-09 | Discharge: 2021-07-09 | Disposition: A | Discharge status: Home / Self Care | Payer: BC Managed Care – PPO | Source: Ambulatory Visit | Attending: Orthopaedic Surgery | Admitting: Orthopaedic Surgery

## 2021-07-09 ENCOUNTER — Ambulatory Visit (HOSPITAL_COMMUNITY): Payer: BC Managed Care – PPO | Admitting: Anesthesiology

## 2021-07-09 ENCOUNTER — Ambulatory Visit (HOSPITAL_COMMUNITY): Payer: BC Managed Care – PPO

## 2021-07-09 ENCOUNTER — Ambulatory Visit (HOSPITAL_COMMUNITY): Payer: BC Managed Care – PPO | Admitting: Physician Assistant

## 2021-07-09 ENCOUNTER — Encounter (HOSPITAL_COMMUNITY): Payer: Self-pay | Admitting: Orthopaedic Surgery

## 2021-07-09 DIAGNOSIS — I10 Essential (primary) hypertension: Secondary | ICD-10-CM | POA: Insufficient documentation

## 2021-07-09 DIAGNOSIS — Z86718 Personal history of other venous thrombosis and embolism: Secondary | ICD-10-CM | POA: Diagnosis not present

## 2021-07-09 DIAGNOSIS — Z8616 Personal history of COVID-19: Secondary | ICD-10-CM | POA: Insufficient documentation

## 2021-07-09 DIAGNOSIS — Z86711 Personal history of pulmonary embolism: Secondary | ICD-10-CM | POA: Insufficient documentation

## 2021-07-09 DIAGNOSIS — K5090 Crohn's disease, unspecified, without complications: Secondary | ICD-10-CM | POA: Diagnosis not present

## 2021-07-09 DIAGNOSIS — M65312 Trigger thumb, left thumb: Secondary | ICD-10-CM | POA: Diagnosis not present

## 2021-07-09 DIAGNOSIS — Z09 Encounter for follow-up examination after completed treatment for conditions other than malignant neoplasm: Secondary | ICD-10-CM

## 2021-07-09 DIAGNOSIS — M19012 Primary osteoarthritis, left shoulder: Secondary | ICD-10-CM | POA: Diagnosis not present

## 2021-07-09 HISTORY — PX: TRIGGER FINGER RELEASE: SHX641

## 2021-07-09 HISTORY — PX: TOTAL SHOULDER ARTHROPLASTY: SHX126

## 2021-07-09 LAB — GLUCOSE, CAPILLARY
Glucose-Capillary: 138 mg/dL — ABNORMAL HIGH (ref 70–99)
Glucose-Capillary: 162 mg/dL — ABNORMAL HIGH (ref 70–99)

## 2021-07-09 SURGERY — ARTHROPLASTY, SHOULDER, TOTAL
Anesthesia: Regional | Site: Shoulder | Laterality: Left

## 2021-07-09 MED ORDER — OXYCODONE HCL 5 MG PO TABS: 5.0000 mg | ORAL_TABLET | Freq: Once | ORAL | Status: DC | PRN

## 2021-07-09 MED ORDER — DEXAMETHASONE SODIUM PHOSPHATE 10 MG/ML IJ SOLN
INTRAMUSCULAR | Status: AC
  Filled 2021-07-09: qty 1

## 2021-07-09 MED ORDER — 0.9 % SODIUM CHLORIDE (POUR BTL) OPTIME
TOPICAL | Status: DC | PRN
  Administered 2021-07-09: 1000 mL

## 2021-07-09 MED ORDER — OXYCODONE HCL 5 MG PO TABS: ORAL_TABLET | ORAL | 0 refills | Status: AC

## 2021-07-09 MED ORDER — GABAPENTIN 300 MG PO CAPS
300.0000 mg | ORAL_CAPSULE | Freq: Once | ORAL | Status: AC
  Administered 2021-07-09: 300 mg via ORAL
  Filled 2021-07-09: qty 1

## 2021-07-09 MED ORDER — PROPOFOL 10 MG/ML IV BOLUS
INTRAVENOUS | Status: DC | PRN
  Administered 2021-07-09: 200 mg via INTRAVENOUS

## 2021-07-09 MED ORDER — VANCOMYCIN HCL 1000 MG IV SOLR
INTRAVENOUS | Status: AC
  Filled 2021-07-09: qty 20

## 2021-07-09 MED ORDER — LIDOCAINE 2% (20 MG/ML) 5 ML SYRINGE
INTRAMUSCULAR | Status: DC | PRN
Start: 2021-07-09 — End: 2021-07-09
  Administered 2021-07-09: 40 mg via INTRAVENOUS

## 2021-07-09 MED ORDER — PROPOFOL 10 MG/ML IV BOLUS
INTRAVENOUS | Status: AC
  Filled 2021-07-09: qty 40

## 2021-07-09 MED ORDER — FENTANYL CITRATE PF 50 MCG/ML IJ SOSY
50.0000 ug | PREFILLED_SYRINGE | INTRAMUSCULAR | Status: DC
  Filled 2021-07-09: qty 2

## 2021-07-09 MED ORDER — AMISULPRIDE (ANTIEMETIC) 5 MG/2ML IV SOLN: 10.0000 mg | Freq: Once | INTRAVENOUS | Status: DC | PRN

## 2021-07-09 MED ORDER — ORAL CARE MOUTH RINSE: 15.0000 mL | Freq: Once | OROMUCOSAL | Status: AC

## 2021-07-09 MED ORDER — PHENYLEPHRINE 40 MCG/ML (10ML) SYRINGE FOR IV PUSH (FOR BLOOD PRESSURE SUPPORT)
PREFILLED_SYRINGE | INTRAVENOUS | Status: DC | PRN
  Administered 2021-07-09: 80 ug via INTRAVENOUS

## 2021-07-09 MED ORDER — ROCURONIUM BROMIDE 10 MG/ML (PF) SYRINGE
PREFILLED_SYRINGE | INTRAVENOUS | Status: DC | PRN
  Administered 2021-07-09: 40 mg via INTRAVENOUS

## 2021-07-09 MED ORDER — BUPIVACAINE-EPINEPHRINE (PF) 0.25% -1:200000 IJ SOLN
INTRAMUSCULAR | Status: AC
  Filled 2021-07-09: qty 30

## 2021-07-09 MED ORDER — OXYCODONE HCL 5 MG/5ML PO SOLN: 5.0000 mg | Freq: Once | ORAL | Status: DC | PRN

## 2021-07-09 MED ORDER — LACTATED RINGERS IV BOLUS
250.0000 mL | Freq: Once | INTRAVENOUS | Status: AC
  Administered 2021-07-09: 250 mL via INTRAVENOUS

## 2021-07-09 MED ORDER — VANCOMYCIN HCL 1 G IV SOLR
INTRAVENOUS | Status: DC | PRN
  Administered 2021-07-09: 1000 mg via TOPICAL

## 2021-07-09 MED ORDER — FENTANYL CITRATE (PF) 100 MCG/2ML IJ SOLN
INTRAMUSCULAR | Status: AC
  Filled 2021-07-09: qty 2

## 2021-07-09 MED ORDER — MIDAZOLAM HCL 2 MG/2ML IJ SOLN
1.0000 mg | INTRAMUSCULAR | Status: AC
  Administered 2021-07-09: 2 mg via INTRAVENOUS
  Filled 2021-07-09: qty 2

## 2021-07-09 MED ORDER — ACETAMINOPHEN 500 MG PO TABS: 1000.0000 mg | ORAL_TABLET | Freq: Three times a day (TID) | ORAL | 0 refills | Status: AC

## 2021-07-09 MED ORDER — ONDANSETRON HCL 4 MG PO TABS: 4.0000 mg | ORAL_TABLET | Freq: Three times a day (TID) | ORAL | 0 refills | Status: AC | PRN

## 2021-07-09 MED ORDER — ONDANSETRON HCL 4 MG/2ML IJ SOLN
INTRAMUSCULAR | Status: DC | PRN
  Administered 2021-07-09: 4 mg via INTRAVENOUS

## 2021-07-09 MED ORDER — CEFAZOLIN SODIUM-DEXTROSE 2-4 GM/100ML-% IV SOLN
2.0000 g | INTRAVENOUS | Status: AC
  Administered 2021-07-09: 3 g via INTRAVENOUS
  Filled 2021-07-09: qty 100

## 2021-07-09 MED ORDER — STERILE WATER FOR IRRIGATION IR SOLN
Status: DC | PRN
  Administered 2021-07-09: 2000 mL

## 2021-07-09 MED ORDER — PHENYLEPHRINE HCL-NACL 20-0.9 MG/250ML-% IV SOLN
INTRAVENOUS | Status: DC | PRN
  Administered 2021-07-09: 50 ug/min via INTRAVENOUS

## 2021-07-09 MED ORDER — LACTATED RINGERS IV SOLN: INTRAVENOUS | Status: DC

## 2021-07-09 MED ORDER — CHLORHEXIDINE GLUCONATE 0.12 % MT SOLN
15.0000 mL | Freq: Once | OROMUCOSAL | Status: AC
  Administered 2021-07-09: 15 mL via OROMUCOSAL

## 2021-07-09 MED ORDER — FENTANYL CITRATE (PF) 100 MCG/2ML IJ SOLN
INTRAMUSCULAR | Status: DC | PRN
  Administered 2021-07-09: 100 ug via INTRAVENOUS
  Administered 2021-07-09 (×2): 25 ug via INTRAVENOUS
  Administered 2021-07-09: 50 ug via INTRAVENOUS

## 2021-07-09 MED ORDER — SUGAMMADEX SODIUM 200 MG/2ML IV SOLN
INTRAVENOUS | Status: DC | PRN
  Administered 2021-07-09: 300 mg via INTRAVENOUS

## 2021-07-09 MED ORDER — TRANEXAMIC ACID-NACL 1000-0.7 MG/100ML-% IV SOLN
1000.0000 mg | INTRAVENOUS | Status: AC
  Administered 2021-07-09: 1000 mg via INTRAVENOUS
  Filled 2021-07-09: qty 100

## 2021-07-09 MED ORDER — PROMETHAZINE HCL 25 MG/ML IJ SOLN: 6.2500 mg | INTRAMUSCULAR | Status: DC | PRN

## 2021-07-09 MED ORDER — RIVAROXABAN 10 MG PO TABS: 10.0000 mg | ORAL_TABLET | Freq: Every day | ORAL | 0 refills | Status: AC

## 2021-07-09 MED ORDER — PHENYLEPHRINE HCL (PRESSORS) 10 MG/ML IV SOLN
INTRAVENOUS | Status: AC
  Filled 2021-07-09: qty 2

## 2021-07-09 MED ORDER — LACTATED RINGERS IV BOLUS
500.0000 mL | Freq: Once | INTRAVENOUS | Status: AC
  Administered 2021-07-09: 500 mL via INTRAVENOUS

## 2021-07-09 MED ORDER — LIDOCAINE HCL (PF) 1 % IJ SOLN
INTRAMUSCULAR | Status: AC
  Filled 2021-07-09: qty 30

## 2021-07-09 MED ORDER — BUPIVACAINE LIPOSOME 1.3 % IJ SUSP
INTRAMUSCULAR | Status: DC | PRN
  Administered 2021-07-09: 10 mL via PERINEURAL

## 2021-07-09 MED ORDER — SODIUM CHLORIDE 0.9 % IR SOLN
Status: DC | PRN
  Administered 2021-07-09: 1000 mL

## 2021-07-09 MED ORDER — BUPIVACAINE HCL (PF) 0.5 % IJ SOLN
INTRAMUSCULAR | Status: DC | PRN
  Administered 2021-07-09: 15 mL via PERINEURAL

## 2021-07-09 MED ORDER — FENTANYL CITRATE PF 50 MCG/ML IJ SOSY: 25.0000 ug | PREFILLED_SYRINGE | INTRAMUSCULAR | Status: DC | PRN

## 2021-07-09 MED ORDER — DEXAMETHASONE SODIUM PHOSPHATE 10 MG/ML IJ SOLN
INTRAMUSCULAR | Status: DC | PRN
  Administered 2021-07-09: 5 mg via INTRAVENOUS

## 2021-07-09 MED ORDER — ACETAMINOPHEN 500 MG PO TABS
1000.0000 mg | ORAL_TABLET | Freq: Once | ORAL | Status: AC
  Administered 2021-07-09: 1000 mg via ORAL
  Filled 2021-07-09: qty 2

## 2021-07-09 SURGICAL SUPPLY — 103 items
AID PSTN UNV HD RSTRNT DISP (MISCELLANEOUS) ×2
APL PRP STRL LF DISP 70% ISPRP (MISCELLANEOUS) ×4
BAG COUNTER SPONGE SURGICOUNT (BAG) ×3 IMPLANT
BAG SPNG CNTER NS LX DISP (BAG) ×2
BLADE MINI RND TIP GREEN BEAV (BLADE) IMPLANT
BLADE SAW SAG 73X25 THK (BLADE) ×1
BLADE SAW SGTL 73X25 THK (BLADE) ×2 IMPLANT
BLADE SURG 15 STRL LF DISP TIS (BLADE) ×2 IMPLANT
BLADE SURG 15 STRL SS (BLADE) ×3
BNDG CMPR 9X4 STRL LF SNTH (GAUZE/BANDAGES/DRESSINGS)
BNDG ELASTIC 2X5.8 VLCR STR LF (GAUZE/BANDAGES/DRESSINGS) ×3 IMPLANT
BNDG ELASTIC 3X5.8 VLCR STR LF (GAUZE/BANDAGES/DRESSINGS) ×1 IMPLANT
BNDG ESMARK 4X9 LF (GAUZE/BANDAGES/DRESSINGS) IMPLANT
BNDG GAUZE ELAST 4 BULKY (GAUZE/BANDAGES/DRESSINGS) ×3 IMPLANT
CEMENT BONE DEPUY (Cement) ×1 IMPLANT
CHLORAPREP W/TINT 26 (MISCELLANEOUS) ×6 IMPLANT
CLSR STERI-STRIP ANTIMIC 1/2X4 (GAUZE/BANDAGES/DRESSINGS) ×3 IMPLANT
COMP GLENOID AEQUALIS CL S30 (Shoulder) ×3 IMPLANT
COMPONENT GLENOD AEQLS CL S30 (Shoulder) IMPLANT
COOLER ICEMAN CLASSIC (MISCELLANEOUS) ×1 IMPLANT
COVER BACK TABLE 60X90IN (DRAPES) ×3 IMPLANT
COVER MAYO STAND STRL (DRAPES) ×3 IMPLANT
COVER SURGICAL LIGHT HANDLE (MISCELLANEOUS) ×3 IMPLANT
CUFF TOURN SGL QUICK 18X4 (TOURNIQUET CUFF) IMPLANT
DRAPE EXTREMITY T 121X128X90 (DISPOSABLE) ×2 IMPLANT
DRAPE INCISE IOBAN 66X45 STRL (DRAPES) ×3 IMPLANT
DRAPE ORTHO SPLIT 77X108 STRL (DRAPES) ×6
DRAPE SHEET LG 3/4 BI-LAMINATE (DRAPES) ×3 IMPLANT
DRAPE SURG ORHT 6 SPLT 77X108 (DRAPES) ×4 IMPLANT
DRAPE TOP 10253 STERILE (DRAPES) ×3 IMPLANT
DRAPE U-SHAPE 47X51 STRL (DRAPES) IMPLANT
DRSG AQUACEL AG ADV 3.5X 6 (GAUZE/BANDAGES/DRESSINGS) ×3 IMPLANT
DRSG EMULSION OIL 3X3 NADH (GAUZE/BANDAGES/DRESSINGS) ×3 IMPLANT
DURAPREP 26ML APPLICATOR (WOUND CARE) ×3 IMPLANT
ELECT BLADE TIP CTD 4 INCH (ELECTRODE) ×3 IMPLANT
ELECT NDL TIP 2.8 STRL (NEEDLE) IMPLANT
ELECT NEEDLE TIP 2.8 STRL (NEEDLE) IMPLANT
ELECT REM PT RETURN 15FT ADLT (MISCELLANEOUS) ×3 IMPLANT
FACESHIELD WRAPAROUND (MASK) ×6 IMPLANT
FACESHIELD WRAPAROUND OR TEAM (MASK) ×2 IMPLANT
GAUZE SPONGE 4X4 12PLY STRL (GAUZE/BANDAGES/DRESSINGS) ×3 IMPLANT
GAUZE XEROFORM 1X8 LF (GAUZE/BANDAGES/DRESSINGS) ×1 IMPLANT
GLOVE SRG 8 PF TXTR STRL LF DI (GLOVE) ×4 IMPLANT
GLOVE SURG ENC MOIS LTX SZ6.5 (GLOVE) ×6 IMPLANT
GLOVE SURG ENC MOIS LTX SZ8 (GLOVE) ×6 IMPLANT
GLOVE SURG ENC TEXT LTX SZ6.5 (GLOVE) ×3 IMPLANT
GLOVE SURG NEOPR MICRO LF SZ8 (GLOVE) ×6 IMPLANT
GLOVE SURG UNDER LTX SZ6.5 (GLOVE) ×3 IMPLANT
GLOVE SURG UNDER POLY LF SZ6.5 (GLOVE) ×3 IMPLANT
GLOVE SURG UNDER POLY LF SZ8 (GLOVE) ×6
GOWN STRL REUS W/ TWL LRG LVL3 (GOWN DISPOSABLE) ×2 IMPLANT
GOWN STRL REUS W/ TWL XL LVL3 (GOWN DISPOSABLE) ×2 IMPLANT
GOWN STRL REUS W/TWL LRG LVL3 (GOWN DISPOSABLE) ×9 IMPLANT
GOWN STRL REUS W/TWL XL LVL3 (GOWN DISPOSABLE) ×3
GUIDE PIN 3X75 SHOULDER (PIN) ×3
GUIDEWIRE GLENOID 2.5X220 (WIRE) ×1 IMPLANT
HANDPIECE INTERPULSE COAX TIP (DISPOSABLE) ×3
HEAD HUMERAL 43X16 SHLDR (Miscellaneous) ×1 IMPLANT
KIT BASIN OR (CUSTOM PROCEDURE TRAY) ×3 IMPLANT
KIT STABILIZATION SHOULDER (MISCELLANEOUS) ×3 IMPLANT
KIT TURNOVER KIT A (KITS) IMPLANT
LOOP VESSEL MAXI BLUE (MISCELLANEOUS) IMPLANT
MANIFOLD NEPTUNE II (INSTRUMENTS) ×3 IMPLANT
NDL HYPO 25X1 1.5 SAFETY (NEEDLE) IMPLANT
NDL MAYO CATGUT SZ4 TPR NDL (NEEDLE) IMPLANT
NEEDLE HYPO 25X1 1.5 SAFETY (NEEDLE) IMPLANT
NEEDLE MAYO CATGUT SZ4 (NEEDLE) IMPLANT
NS IRRIG 1000ML POUR BTL (IV SOLUTION) ×3 IMPLANT
NUCLEUS SHOULDER SZ 2 (Miscellaneous) IMPLANT
NUCLEUS SZ 2 (Miscellaneous) ×3 IMPLANT
PACK SHOULDER (CUSTOM PROCEDURE TRAY) ×3 IMPLANT
PAD COLD SHLDR WRAP-ON (PAD) ×1 IMPLANT
PENCIL SMOKE EVACUATOR (MISCELLANEOUS) IMPLANT
PIN GUIDE 3X75 SHOULDER (PIN) IMPLANT
RESTRAINT HEAD UNIVERSAL NS (MISCELLANEOUS) ×3 IMPLANT
SET HNDPC FAN SPRY TIP SCT (DISPOSABLE) ×2 IMPLANT
SLING ARM IMMOBILIZER LRG (SOFTGOODS) ×1 IMPLANT
SLING ULTRA II L (ORTHOPEDIC SUPPLIES) IMPLANT
SLING ULTRA III MED (ORTHOPEDIC SUPPLIES) IMPLANT
SMARTMIX MINI TOWER (MISCELLANEOUS) ×3
SPONGE T-LAP 18X18 ~~LOC~~+RFID (SPONGE) IMPLANT
STOCKINETTE 4X48 STRL (DRAPES) ×3 IMPLANT
STRIP CLOSURE SKIN 1/2X4 (GAUZE/BANDAGES/DRESSINGS) ×1 IMPLANT
SUCTION FRAZIER HANDLE 12FR (TUBING) ×3
SUCTION TUBE FRAZIER 12FR DISP (TUBING) ×2 IMPLANT
SUT ETHIBOND 2 V 37 (SUTURE) ×3 IMPLANT
SUT ETHIBOND NAB CT1 #1 30IN (SUTURE) ×3 IMPLANT
SUT ETHILON 4 0 PS 2 18 (SUTURE) ×3 IMPLANT
SUT FIBERWIRE #5 38 CONV NDL (SUTURE)
SUT MNCRL AB 4-0 PS2 18 (SUTURE) ×3 IMPLANT
SUT VIC AB 0 CT1 36 (SUTURE) IMPLANT
SUT VIC AB 3-0 SH 27 (SUTURE) ×3
SUT VIC AB 3-0 SH 27X BRD (SUTURE) ×2 IMPLANT
SUT VIC AB 4-0 P-3 18XBRD (SUTURE) IMPLANT
SUT VIC AB 4-0 P3 18 (SUTURE)
SUTURE FIBERWR #5 38 CONV NDL (SUTURE) IMPLANT
SYR BULB EAR ULCER 3OZ GRN STR (SYRINGE) IMPLANT
SYR CONTROL 10ML LL (SYRINGE) IMPLANT
TOWEL OR 17X26 10 PK STRL BLUE (TOWEL DISPOSABLE) ×3 IMPLANT
TOWER SMARTMIX MINI (MISCELLANEOUS) IMPLANT
TUBE SUCTION HIGH CAP CLEAR NV (SUCTIONS) ×3 IMPLANT
UNDERPAD 30X36 HEAVY ABSORB (UNDERPADS AND DIAPERS) ×3 IMPLANT
WATER STERILE IRR 1000ML POUR (IV SOLUTION) ×3 IMPLANT

## 2021-07-09 NOTE — Anesthesia Postprocedure Evaluation (Signed)
Anesthesia Post Note  Patient: [name]  Procedure(s) Performed: TOTAL SHOULDER ARTHROPLASTY (Left: Shoulder) RELEASE TRIGGER FINGER/A-1 PULLEY (Left: Hand)     Patient location during evaluation: PACU Anesthesia Type: Regional and General Level of consciousness: awake Pain management: pain level controlled Vital Signs Assessment: post-procedure vital signs reviewed and stable Respiratory status: spontaneous breathing, nonlabored ventilation, respiratory function stable and patient connected to nasal cannula oxygen Cardiovascular status: blood pressure returned to baseline and stable Postop Assessment: no apparent nausea or vomiting Anesthetic complications: no   No notable events documented.  Last Vitals:  Vitals:   07/09/21 1600 07/09/21 1617  BP: (!) 130/49 (!) 119/59  Pulse: (!) 107 99  Resp: 13   Temp: 37 C 36.6 C  SpO2: 93% 90%    Last Pain:  Vitals:   07/09/21 1617  TempSrc:   PainSc: 0-No pain                 Francessca Friis P Danyle Boening

## 2021-07-09 NOTE — Anesthesia Procedure Notes (Addendum)
Anesthesia Regional Block: Interscalene brachial plexus block   Pre-Anesthetic Checklist: , timeout performed,  Correct Patient, Correct Site, Correct Laterality,  Correct Procedure, Correct Position, site marked,  Risks and benefits discussed,  Surgical consent,  Pre-op evaluation,  At surgeon's request and post-op pain management  Laterality: Left  Prep: chloraprep       Needles:  Injection technique: Single-shot  Needle Type: Echogenic Stimulator Needle     Needle Length: 9cm  Needle Gauge: 21     Additional Needles:   Procedures:,,,, ultrasound used (permanent image in chart),,    Narrative:  Start time: 07/09/2021 12:40 PM End time: 07/09/2021 12:50 PM Injection made incrementally with aspirations every 5 mL.  Performed by: Personally  Anesthesiologist: Murvin Natal, MD  Additional Notes: Functioning IV was confirmed and monitors were applied.  A timeout was performed. Sterile prep, hand hygiene and sterile gloves were used. A 70m 21ga Arrow echogenic stimulator needle was used. Negative aspiration and negative test dose prior to incremental administration of local anesthetic. The patient tolerated the procedure well.  Ultrasound guidance: relevent anatomy identified, needle position confirmed, local anesthetic spread visualized around nerve(s), vascular puncture avoided.  Image printed for medical record.

## 2021-07-09 NOTE — Op Note (Signed)
Orthopaedic Surgery Operative Note (CSN: [id number])  KAILEIA FLOW  10/18/1965 Date of Surgery: 07/09/2021   Diagnoses:  Left shoulder end-stage glenohumeral arthritis and thumb A1 pulley tenosynovitis  Procedure: Left anatomic stemless total Shoulder Arthroplasty Left A1 pulley release thumb   Operative Finding Successful completion of planned procedure.  Patient subscapularis had less excursion than we would expect however we had a appropriate repair.  The superior interval closure was robust.  Stable implants.  A1 pulley release was complete and we were careful to ensure that the bundles were protected throughout.  Post-operative plan: The patient will be NWB in sling.  Sutures can be removed likely at 2 to 4 weeks from the thumb.  The patient will be will be discharged from PACU if continues to be stable as was plan prior to surgery.  DVT prophylaxis Xarelto 10 mg/day.  Pain control with PRN pain medication preferring oral medicines.  Follow up plan will be scheduled in approximately 7 days for incision check and XR.  Physical therapy to start after first visit.  Implants: Tornier size 2 nucleus simplicity, 43 head, 30 small glenoid  Post-Op Diagnosis: Same Surgeons:Primary: Hiram Gash, MD Assistants:Caroline McBane PA-C Location: [facility] ROOM 06 Anesthesia: General with Exparel Interscalene Antibiotics: Ancef 2g preop, Vancomycin 1084m locally Tourniquet time: None Estimated Blood Loss: 1169Complications: None Specimens: None Implants: Implant Name Type Inv. Item Serial No. Manufacturer Lot No. LRB No. Used Action  CEMENT BONE DEPUY - [id number] CEMENT BONE DEPUY  DEPUY ORTHOPAEDICS [id number] 1 Implanted  COMP GLENOID AEQUALIS CL S30 - [id number] COMP GLENOID AEQUALIS CL S30 [id number]  Left 1 Implanted  NUCLEUS SZ 2 - [id number] NUCLEUS SZ 2 [id number]  Left 1 Implanted  HEAD HUMERAL [id number] [facility]- [id number] Miscellaneous HEAD HUMERAL 43X16 [id number]  Left 1 Implanted    Indications for Surgery:   JAUDRIS SPEAKERis a 55y.o. female with end-stage glenohumeral arthritis and thumb triggering.  Benefits and risks of operative and nonoperative management were discussed prior to surgery with patient/guardian(s) and informed consent form was completed.  Infection and need for further surgery were discussed as was prosthetic stability and cuff issues.  We additionally specifically discussed risks of axillary nerve injury, infection, periprosthetic fracture, continued pain and longevity of implants prior to beginning procedure.      Procedure:   The patient was identified in the preoperative holding area where the surgical site was marked. Block placed by anesthesia with exparel.  The patient was taken to the OR where a procedural timeout was called and the above noted anesthesia was induced.  The patient was positioned beachchair on allen table with spider arm positioner.  Preoperative antibiotics were dosed.  The patient's left shoulder was prepped and draped in the usual sterile fashion.  A second preoperative timeout was called.       Standard deltopectoral approach was performed with a #10 blade. We dissected down to the subcutaneous tissues and the cephalic vein was taken laterally with the deltoid. Clavipectoral fascia was incised in line with the incision. Deep retractors were placed. The long of the biceps tendon was identified and there was significant tenosynovitis present.  Tenodesis was performed to the pectoralis tendon with #2 Ethibond. The remaining biceps was followed up into the rotator interval where it was released. The subscapularis was taken down with a lesser tuberosity osteotomy using an osteotome. The osteotomy fragment and underlying capsular elevated off  of the humeral neck and the osteophytes inferiorly. #2 Ethibond sutures are passed through the bone tendon junction  for subscap manipulation. We continued releasing the capsule directly off of the osteophytes inferiorly all the way around the corner. This allowed [place] to dislocate the humeral head. The humeral head had evidence of severe osteoarthritic wear with full-thickness cartilage loss and exposed subchondral bone. There was significant flattening of the humeral head.   The rotator cuff was carefully examined and noted to be intact without sign of wear.  The decision was confirmed that an anatomic total shoulder was indicated for this patient.  There were osteophytes along the inferior humeral neck. The osteophytes were removed with an osteotome and a rongeur.  Osteophytes were removed with a rongeur and an osteotome and the anatomic neck was well visualized.   We next made our humeral osteotomy with an oscillating saw along the anatomic neck. The head fragment was passed off the back table and measured approximately 43 mm in diameter.   Bone quality was reasonable and we decided that it was appropriate to use simpliciti stemless implants and placed a guidepin perpendicular to our cut using a guide.  This obtained bicortical purchase.  We then reamed off of this to a flat surface and drilled and placed our nucleus.  A cut protector was placed.  The subscapularis was again identified and immediately we took care to palpate the axillary nerve anteriorly and verify its position with gentle palpation as well as the tug test.  We then released the SGHL with bovie cautery prior to placing a curved mayo at the junction of the anterior glenoid well above the axillary nerve and bluntly dissecting the subscapularis from the capsule.  We then carefully protected the axillary nerve as we gently released the inferior capsule to fully mobilize the subscapularis.  An anterior deltoid retractor was then placed as well as a small Hohmann retractor superiorly.    The glenoid was inspected and had evidence of severe osteoarthritic  wear with full-thickness cartilage loss and exposed subchondral bone. The remaining labrum was removed circumferentially taking great care not to disrupt the posterior capsule. The glenoid was sized as listed in the implants above. The center hole was marked and we used a glenoid drill guide to place a guide pin in the center position.  The glenoid was reamed concentrically over the guide pin. Next the center hole was enlarged and the drill guide for the peripheral pegs was placed. The vault was intact. The peripheral pegs were drilled in standard fashion. A trial glenoid was placed and was stable. We removed the trial components.   The glenoid bone was prepared with pulsatile lavage and a sponge to dry the bone prior to cement application. Cement was mixed on the back table the peripheral peg holes were cemented. The glenoid was impacted securely.   We turned attention back to the humeral side. The cut protector was removed. We trialed with multiple size head options and selected a 43 which re-created the patient's anatomy. The offset was dialed in to match the normal anatomy. The shoulder was trialed.  There was good ROM in all planes and the shoulder was stable with approximately 50% posterior spring back and no inferior translation.  The real humeral implants were opened on the back table and assembled.  The trial was removed. #5 Fiberwire sutures passed through the humeral neck for subscap repair. The humeral component was press-fit obtaining a secure fit.  The joint was reduced  and thoroughly irrigated with pulsatile lavage. Subscap and lesser tuberosity osteotomy repaired back in a double row fashion with #5 Fiberwire sutures through bone tunnels. Next the rotator interval was closed with #2 ethibond suture. Hemostasis was obtained. The deltopectoral interval was reapproximated with #1 Ethibond. The subcutaneous tissues were closed with 2-0 Vicryl and the skin was closed with a running monocryl. The  incisions were cleaned and dried and an Aquacel dressing was placed.   We turned our attention to the left thumb.  We identified the A1 pulley by palpation.  At that point we were able to make a 3 cm incision overlying the A1 pulley we dissected down bluntly protecting medial and lateral neurovascular bundles.  We identified the A1 pulley and opened it with a sharp scissor.  We did this with direct visualization.  The flexor tendon was pulled out of the incision and it was clear that it was moving without issue.  We irrigated copiously and closed the incision with Prolene suture and placed a bulky sterile dressing.  The drapes taken down. The arm was placed into sling without pillow due to the patient's body habitus. Patient was awakened, extubated, and transferred to the recovery room in stable condition. There were no intraoperative complications. The sponge, needle, and attention counts were correct at the end of the case.   Noemi Chapel, PA-C, present and scrubbed throughout the case, critical for completion in a timely fashion, and for retraction, instrumentation, closure.

## 2021-07-09 NOTE — Progress Notes (Signed)
AssistedDr. Roanna Banning with left, ultrasound guided, interscalene  block. Side rails up, monitors on throughout procedure. See vital signs in flow sheet. Tolerated Procedure well.

## 2021-07-09 NOTE — Transfer of Care (Signed)
Immediate Anesthesia Transfer of Care Note  Patient: [name]  Procedure(s) Performed: TOTAL SHOULDER ARTHROPLASTY (Left: Shoulder) RELEASE TRIGGER FINGER/A-1 PULLEY (Left: Hand)  Patient Location: PACU  Anesthesia Type:GA combined with regional for post-op pain  Level of Consciousness: awake, alert , oriented and patient cooperative  Airway & Oxygen Therapy: Patient Spontanous Breathing and Patient connected to face mask oxygen  Post-op Assessment: Report given to RN and Post -op Vital signs reviewed and stable  Post vital signs: Reviewed and stable  Last Vitals:  Vitals Value Taken Time  BP 153/61 07/09/21 1521  Temp    Pulse 112 07/09/21 1524  Resp 14 07/09/21 1524  SpO2 100 % 07/09/21 1524  Vitals shown include unvalidated device data.  Last Pain:  Vitals:   07/09/21 1251  TempSrc:   PainSc: 0-No pain      Patients Stated Pain Goal: 6 (12/39/35 9409)  Complications: No notable events documented.

## 2021-07-09 NOTE — Anesthesia Procedure Notes (Signed)
Procedure Name: Intubation Date/Time: 07/09/2021 1:31 PM Performed by: Cleda Daub, CRNA Pre-anesthesia Checklist: Patient identified, Emergency Drugs available, Suction available and Patient being monitored Patient Re-evaluated:Patient Re-evaluated prior to induction Oxygen Delivery Method: Circle system utilized Preoxygenation: Pre-oxygenation with 100% oxygen Induction Type: IV induction Ventilation: Two handed mask ventilation required Laryngoscope Size: Glidescope and 4 (elective; difficult to optimize intubation position on shoulder bed.) Grade View: Grade I Tube type: Oral Tube size: 7.0 mm Number of attempts: 1 Airway Equipment and Method: Stylet Placement Confirmation: ETT inserted through vocal cords under direct vision, positive ETCO2 and breath sounds checked- equal and bilateral Secured at: 21 cm Tube secured with: Tape Dental Injury: Teeth and Oropharynx as per pre-operative assessment

## 2021-07-09 NOTE — Interval H&P Note (Signed)
All questions answered, patient wants to proceed with procedure.

## 2021-07-09 NOTE — Discharge Instructions (Addendum)
Ophelia Charter MD, MPH Noemi Chapel, PA-C [place] [address] [phone] (tel)   [phone] (fax)   POST-OPERATIVE INSTRUCTIONS - TOTAL SHOULDER REPLACEMENT    WOUND CARE - shoulder You may leave the operative dressing in place until your follow-up appointment. KEEP THE INCISIONS CLEAN AND DRY. There may be a small amount of fluid/bleeding leaking at the surgical site. This is normal after surgery.  If it fills with liquid or blood please call us immediately to change it for you. Use the provided ice machine or Ice packs as often as possible for the first 3-4 days, then as needed for pain relief.   Keep a layer of cloth or a shirt between your skin and the cooling unit to prevent frost bite as it can get very cold.  WOUND CARE - hand - You may remove your bandage on postop day 3 and get the hand wet.   - No ointments or lotions to be applied to the incision.   - Do not submerge the incision for 1 month. - No washing dishes by hand or submerging hand in dirty water for 1 month  SHOWERING: - You may shower on Post-Op Day #3.  - The dressing is water resistant but do not scrub it as it may start to peel up.   - You may remove the sling for showering, but keep a water resistant pillow under the arm to keep both the  elbow and shoulder away from the body (mimicking the abduction sling).  - Gently pat the area dry.  - Do not soak the shoulder in water. Do not go swimming in the pool or ocean until your incision has completely healed (about 4-6 weeks after surgery) - KEEP THE INCISIONS CLEAN AND DRY.  EXERCISES Wear the sling at all times You may remove the sling for showering, but keep the arm across the chest or in a secondary sling.    Accidental/Purposeful External Rotation and shoulder flexion (reaching behind you) is to be avoided at all costs for the first month. It is ok to come out of your sling if your are sitting and have assistance  for eating.   Do not lift anything heavier than 1 pound until we discuss it further in clinic.   REGIONAL ANESTHESIA (NERVE BLOCKS) The anesthesia team may have performed a nerve block for you if safe in the setting of your care.  This is a great tool used to minimize pain.  Typically the block may start wearing off overnight but the long acting medicine may last for 3-4 days.  The nerve block wearing off can be a challenging period but please utilize your as needed pain medications to try and manage this period.    POST-OP MEDICATIONS- Multimodal approach to pain control In general your pain will be controlled with a combination of substances.  Prescriptions unless otherwise discussed are electronically sent to your pharmacy.  This is a carefully made plan we use to minimize narcotic use.      Acetaminophen - Non-narcotic pain medicine taken on a scheduled basis  Oxycodone - This is a strong narcotic, to be used only on an "as needed" basis for SEVERE pain. Xarelto - This medicine is used to minimize the risk of blood clots after surgery. Zofran -  take as needed for nausea   FOLLOW-UP If you develop a Fever (>101.5), Redness or Drainage from the surgical incision site, please call our office to arrange for an  evaluation. Please call the office to schedule a follow-up appointment for a wound check, 7-10 days post-operatively.  IF YOU HAVE ANY QUESTIONS, PLEASE FEEL FREE TO CALL OUR OFFICE.  HELPFUL INFORMATION  If you had a block, it will wear off between 8-24 hrs postop typically.  This is period when your pain may go from nearly zero to the pain you would have had post-op without the block.  This is an abrupt transition but nothing dangerous is happening.  You may take an extra dose of narcotic when this happens.  Your arm will be in a sling following surgery. You will be in this sling for the next 4 weeks.  I will let you know the exact duration at your follow-up visit.  You may be  more comfortable sleeping in a semi-seated position the first few nights following surgery.  Keep a pillow propped under the elbow and forearm for comfort.  If you have a recliner type of chair it might be beneficial.  If not that is fine too, but it would be helpful to sleep propped up with pillows behind your operated shoulder as well under your elbow and forearm.  This will reduce pulling on the suture lines.  When dressing, put your operative arm in the sleeve first.  When getting undressed, take your operative arm out last.  Loose fitting, button-down shirts are recommended.  In most states it is against the law to drive while your arm is in a sling. And certainly against the law to drive while taking narcotics.  You may return to work/school in the next couple of days when you feel up to it. Desk work and typing in the sling is fine.  We suggest you use the pain medication the first night prior to going to bed, in order to ease any pain when the anesthesia wears off. You should avoid taking pain medications on an empty stomach as it will make you nauseous.  Do not drink alcoholic beverages or take illicit drugs when taking pain medications.  Pain medication may make you constipated.  Below are a few solutions to try in this order: Decrease the amount of pain medication if you aren't having pain. Drink lots of decaffeinated fluids. Drink prune juice and/or each dried prunes  If the first 3 don't work start with additional solutions Take Colace - an over-the-counter stool softener Take Senokot - an over-the-counter laxative Take Miralax - a stronger over-the-counter laxative   Dental Antibiotics:  In most cases prophylactic antibiotics for Dental procdeures after total joint surgery are not necessary.  Exceptions are as follows:  1. History of prior total joint infection  2. Severely immunocompromised (Organ Transplant, cancer chemotherapy, Rheumatoid biologic meds such as  [place])  3. Poorly controlled diabetes (A1C &gt; 8.0, blood glucose over 200)  If you have one of these conditions, contact your surgeon for an antibiotic prescription, prior to your dental procedure.   For more information including helpful videos and documents visit our website:   [email or web address]

## 2021-07-14 ENCOUNTER — Encounter (HOSPITAL_COMMUNITY): Payer: Self-pay | Admitting: Orthopaedic Surgery

## 2021-07-31 ENCOUNTER — Other Ambulatory Visit (INDEPENDENT_AMBULATORY_CARE_PROVIDER_SITE_OTHER): Payer: Self-pay | Admitting: Gastroenterology

## 2021-08-01 NOTE — Telephone Encounter (Signed)
Last seen 04/03/20, next visit in march

## 2021-08-04 NOTE — Telephone Encounter (Signed)
Dr Laural Golden approved 5 refills of this med. When I went to send in. Warning came up stating taking mercaptopurine and mesalamine - bone marrow toxicity may occur. Is it ok to send in refills?

## 2021-09-16 ENCOUNTER — Ambulatory Visit: Payer: BC Managed Care – PPO | Admitting: Physician Assistant

## 2021-09-16 ENCOUNTER — Encounter: Payer: Self-pay | Admitting: Physician Assistant

## 2021-09-16 ENCOUNTER — Other Ambulatory Visit: Payer: Self-pay

## 2021-09-16 DIAGNOSIS — Z86018 Personal history of other benign neoplasm: Secondary | ICD-10-CM | POA: Diagnosis not present

## 2021-09-16 DIAGNOSIS — Z1283 Encounter for screening for malignant neoplasm of skin: Secondary | ICD-10-CM | POA: Diagnosis not present

## 2021-09-16 DIAGNOSIS — D485 Neoplasm of uncertain behavior of skin: Secondary | ICD-10-CM | POA: Diagnosis not present

## 2021-09-16 NOTE — Patient Instructions (Signed)

## 2021-09-22 ENCOUNTER — Encounter: Payer: Self-pay | Admitting: Physician Assistant

## 2021-09-22 NOTE — Progress Notes (Signed)
° °  New Patient   Subjective  [name] is a 56 y.o. female who presents for the following: Annual Exam (2016/17 last visit history of mild/mod/severe atypia, no new concerns).   The following portions of the chart were reviewed this encounter and updated as appropriate:  Tobacco   Allergies   Meds   Problems   Med Hx   Surg Hx   Fam Hx       Objective  Well appearing patient in no apparent distress; mood and affect are within normal limits.  A full examination was performed including scalp, head, eyes, ears, nose, lips, neck, chest, axillae, abdomen, back, buttocks, bilateral upper extremities, bilateral lower extremities, hands, feet, fingers, toes, fingernails, and toenails. All findings within normal limits unless otherwise noted below.  Left Upper Back Bichromic dark nested macule.        Right Lower Leg - Anterior Bichromic dark nested macule.         Assessment & Plan  Neoplasm of uncertain behavior of skin (2) Left Upper Back  Skin / nail biopsy Type of biopsy: tangential   Informed consent: discussed and consent obtained   Timeout: patient name, date of birth, surgical site, and procedure verified   Anesthesia: the lesion was anesthetized in a standard fashion   Anesthetic:  1% lidocaine w/ epinephrine 1-100,000 local infiltration Instrument used: flexible razor blade   Hemostasis achieved with: aluminum chloride and electrodesiccation   Outcome: patient tolerated procedure well   Post-procedure details: wound care instructions given    Specimen 1 - Surgical pathology Differential Diagnosis: R/O ATYPIA   Check Margins: YES  Right Lower Leg - Anterior  Skin / nail biopsy Type of biopsy: tangential   Informed consent: discussed and consent obtained   Timeout: patient name, date of birth, surgical site, and procedure verified   Anesthesia: the lesion was anesthetized in a standard fashion   Anesthetic:  1% lidocaine w/ epinephrine 1-100,000  local infiltration Instrument used: flexible razor blade   Hemostasis achieved with: aluminum chloride and electrodesiccation   Outcome: patient tolerated procedure well   Post-procedure details: wound care instructions given    Specimen 2 - Surgical pathology Differential Diagnosis: R/O ATYPIA  Check Margins: YES     I, Danashia Landers, PA-C, have reviewed all documentation's for this visit.  The documentation on 09/22/21 for the exam, diagnosis, procedures and orders are all accurate and complete.

## 2021-09-23 ENCOUNTER — Telehealth: Payer: Self-pay

## 2021-09-23 NOTE — Telephone Encounter (Signed)
-----   Message from Warren Danes, [place] sent at 09/22/2021  2:48 PM EST ----- ws

## 2021-09-23 NOTE — Telephone Encounter (Signed)
Phone call to patient with her pathology results. Patient aware of results.  

## 2021-09-24 ENCOUNTER — Other Ambulatory Visit (INDEPENDENT_AMBULATORY_CARE_PROVIDER_SITE_OTHER): Payer: Self-pay | Admitting: *Deleted

## 2021-09-24 DIAGNOSIS — K50919 Crohn's disease, unspecified, with unspecified complications: Secondary | ICD-10-CM

## 2021-10-03 ENCOUNTER — Other Ambulatory Visit: Payer: Self-pay | Admitting: Orthopaedic Surgery

## 2021-10-03 DIAGNOSIS — M545 Low back pain, unspecified: Secondary | ICD-10-CM

## 2021-10-07 LAB — COMPREHENSIVE METABOLIC PANEL
AG Ratio: 1.6 (calc) (ref 1.0–2.5)
ALT: 26 U/L (ref 6–29)
AST: 20 U/L (ref 10–35)
Albumin: 4.1 g/dL (ref 3.6–5.1)
Alkaline phosphatase (APISO): 65 U/L (ref 37–153)
BUN: 17 mg/dL (ref 7–25)
CO2: 29 mmol/L (ref 20–32)
Calcium: 9.1 mg/dL (ref 8.6–10.4)
Chloride: 101 mmol/L (ref 98–110)
Creat: 0.57 mg/dL (ref 0.50–1.03)
Globulin: 2.5 g/dL (calc) (ref 1.9–3.7)
Glucose, Bld: 156 mg/dL — ABNORMAL HIGH (ref 65–139)
Potassium: 4 mmol/L (ref 3.5–5.3)
Sodium: 138 mmol/L (ref 135–146)
Total Bilirubin: 0.7 mg/dL (ref 0.2–1.2)
Total Protein: 6.6 g/dL (ref 6.1–8.1)

## 2021-10-07 LAB — CBC WITH DIFFERENTIAL/PLATELET
Absolute Monocytes: 442 cells/uL (ref 200–950)
Basophils Absolute: 32 cells/uL (ref 0–200)
Basophils Relative: 0.5 %
Eosinophils Absolute: 128 cells/uL (ref 15–500)
Eosinophils Relative: 2 %
HCT: 45.7 % — ABNORMAL HIGH (ref 35.0–45.0)
Hemoglobin: 15.5 g/dL (ref 11.7–15.5)
Lymphs Abs: 2221 cells/uL (ref 850–3900)
MCH: 32.9 pg (ref 27.0–33.0)
MCHC: 33.9 g/dL (ref 32.0–36.0)
MCV: 97 fL (ref 80.0–100.0)
MPV: 9.9 fL (ref 7.5–12.5)
Monocytes Relative: 6.9 %
Neutro Abs: 3578 cells/uL (ref 1500–7800)
Neutrophils Relative %: 55.9 %
Platelets: 277 10*3/uL (ref 140–400)
RBC: 4.71 10*6/uL (ref 3.80–5.10)
RDW: 12.1 % (ref 11.0–15.0)
Total Lymphocyte: 34.7 %
WBC: 6.4 10*3/uL (ref 3.8–10.8)

## 2021-10-07 IMAGING — DX DG CHEST 1V PORT
1 series · 1 of 1 positions shown · non-contrast
Comparison: 04/25/2020.

CLINICAL DATA: COVID.

EXAM:
PORTABLE CHEST 1 VIEW

[chest ap]
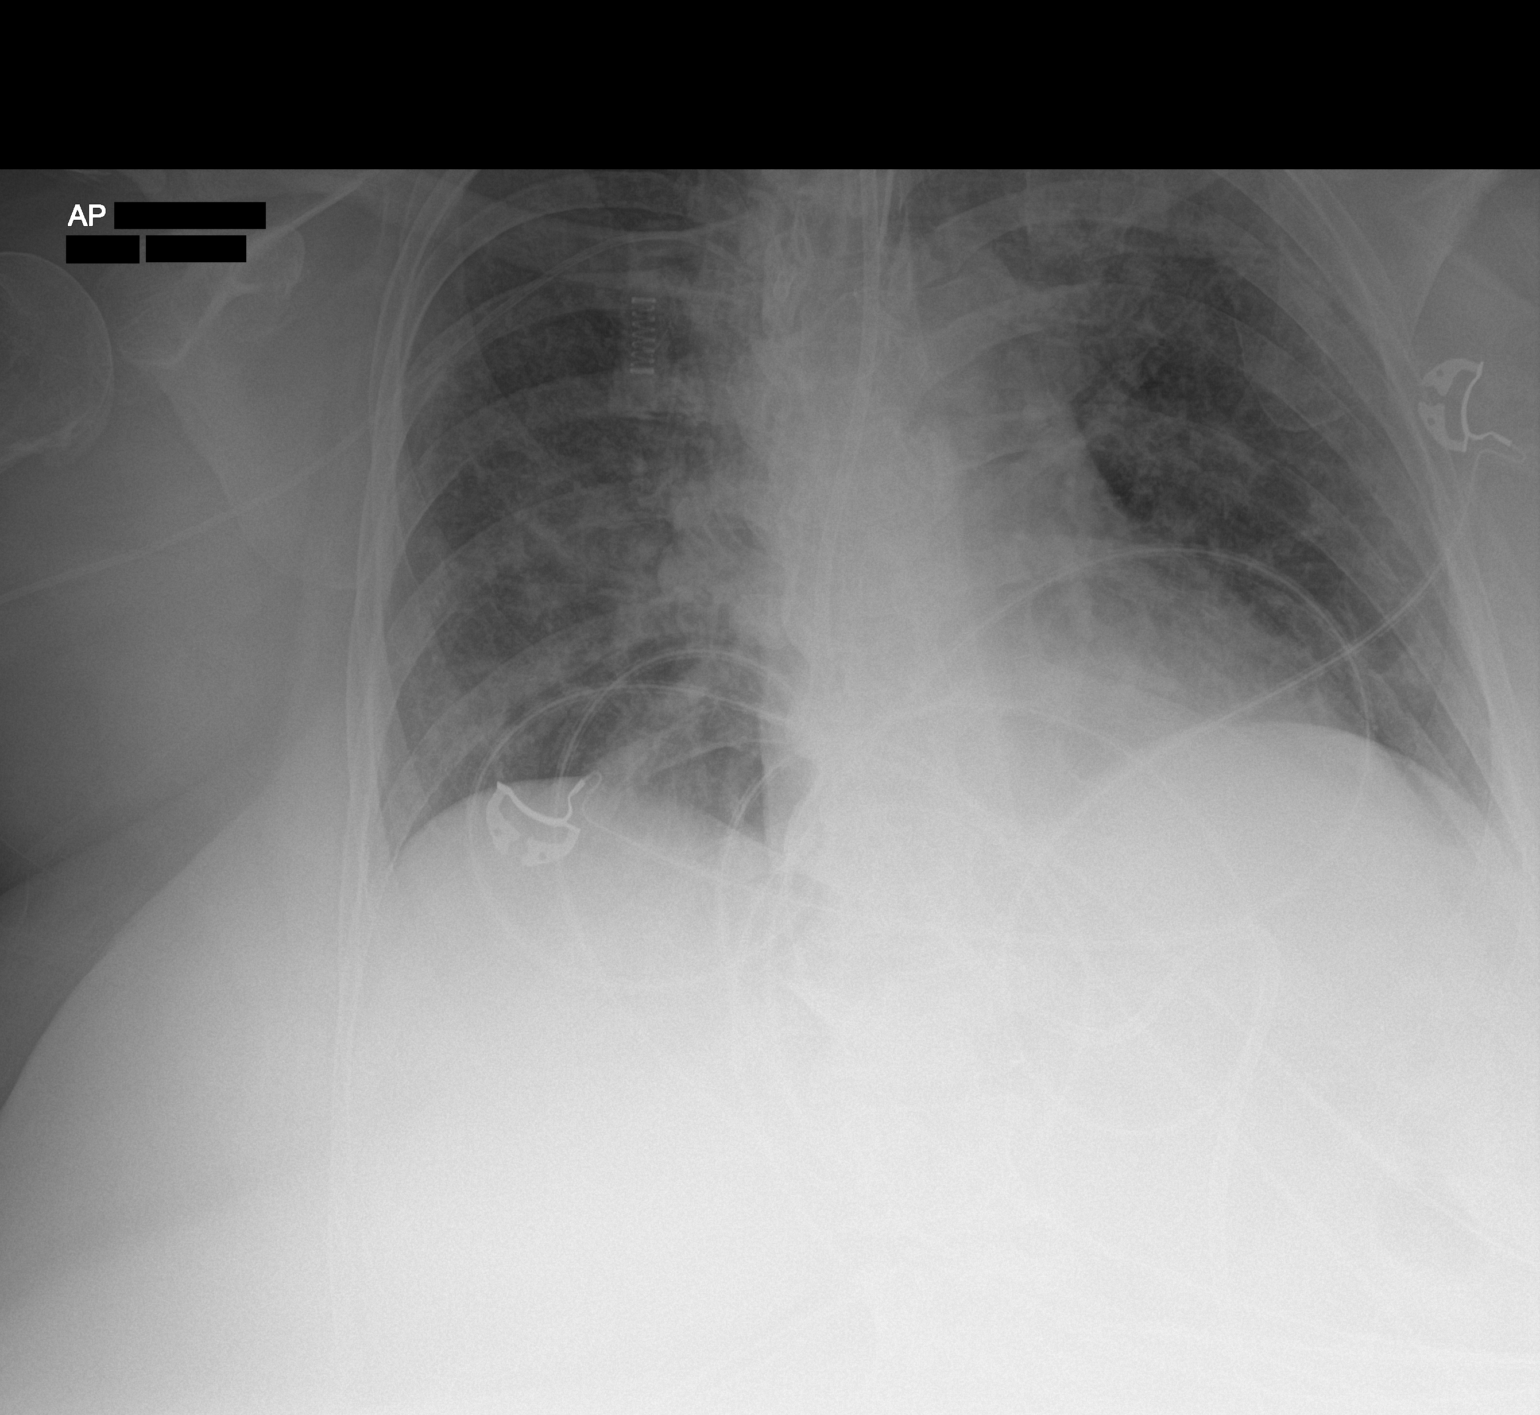

[1 of 1 positions shown; findings below may reference images not displayed]

FINDINGS: Endotracheal tube, feeding tube, right PICC line in stable position.
Heart size stable. Diffuse bilateral pulmonary infiltrates again
noted. Similar findings on prior exam. No pleural effusion or
pneumothorax.
IMPRESSION: 1.  Lines and tubes in stable position.

2. Diffuse bilateral pulmonary infiltrates again noted without
interim change.

## 2021-10-08 IMAGING — DX DG CHEST 1V PORT
1 series · 1 of 1 positions shown · non-contrast
Comparison: April 06, 2020

CLINICAL DATA: Respiratory failure

EXAM:
PORTABLE CHEST 1 VIEW

[chest ap]
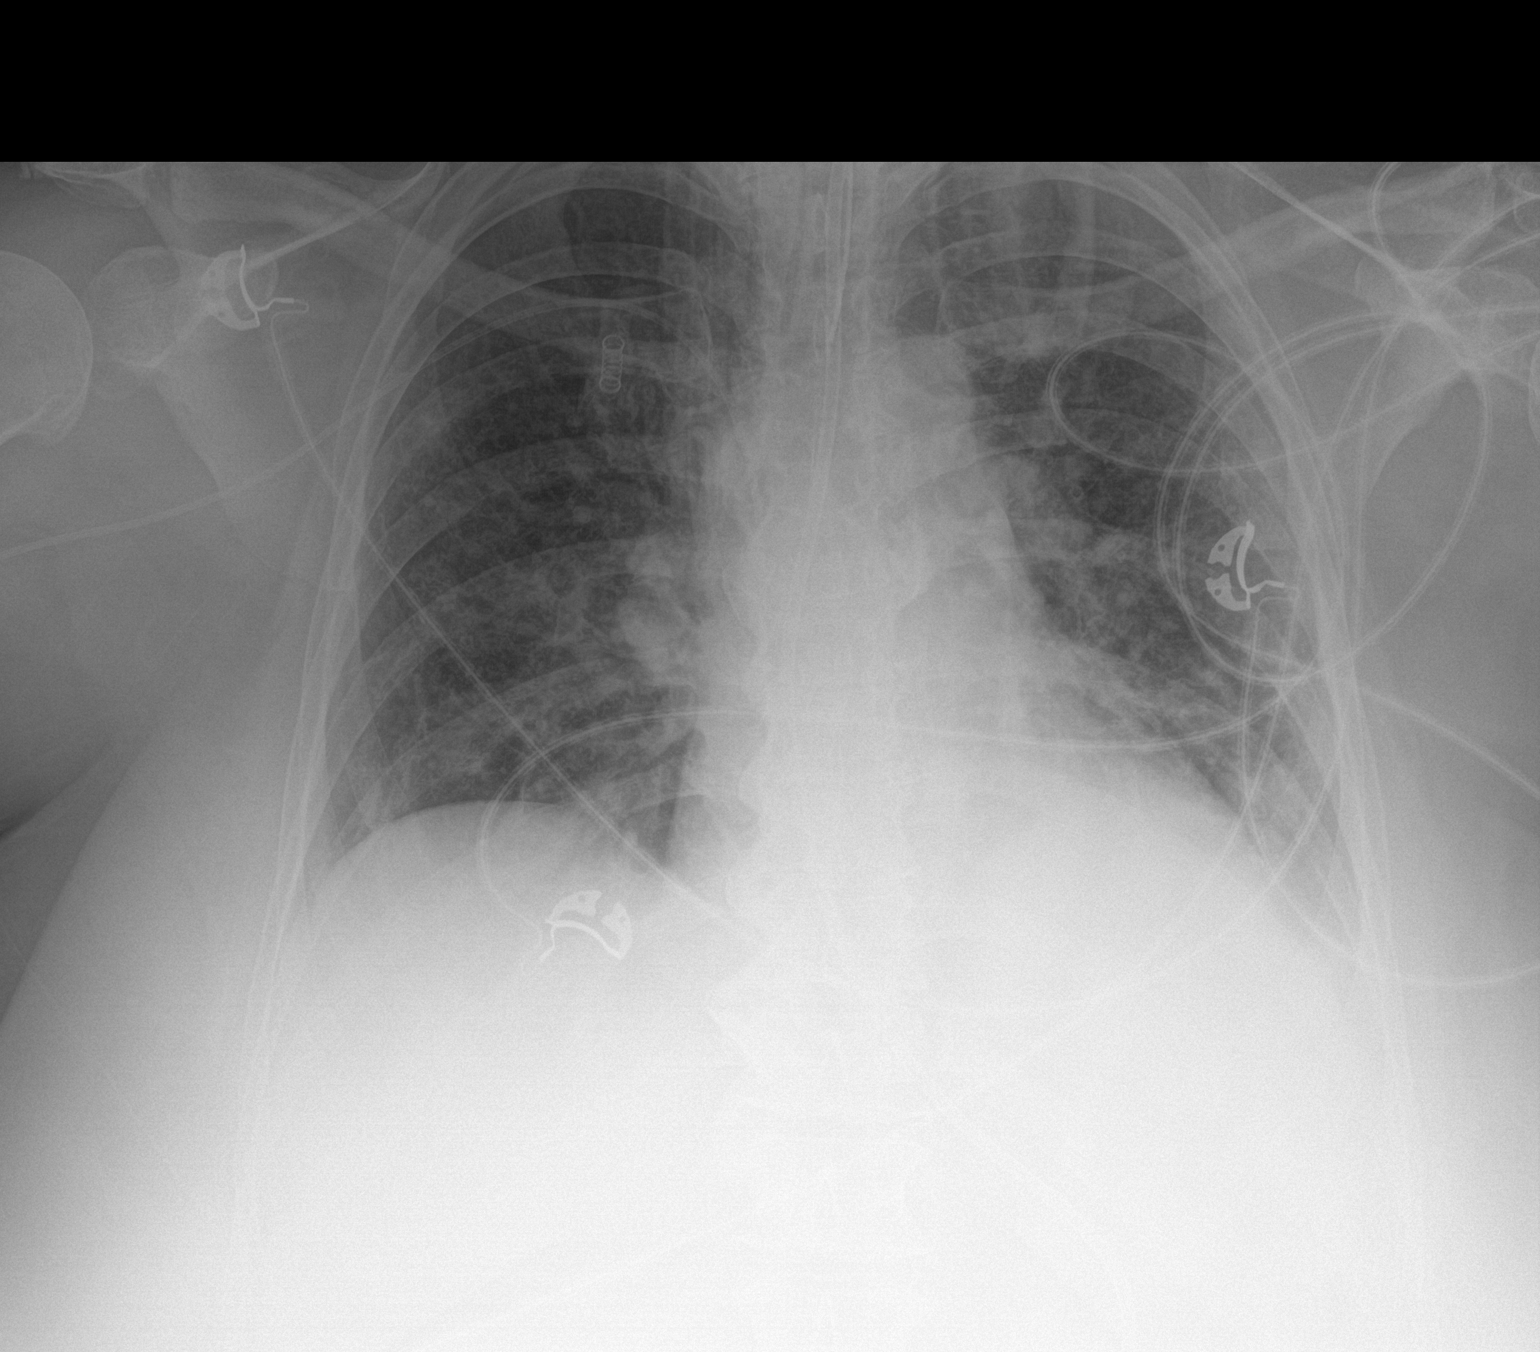

[1 of 1 positions shown; findings below may reference images not displayed]

FINDINGS: The right PICC line is stable. The feeding tube terminates below
today's film. The ETT is in good position. No pneumothorax.
Bilateral patchy pulmonary infiltrates are similar in the interval.
No interval changes.
IMPRESSION: 1. Support apparatus as above.
2. Bilateral pulmonary infiltrates are similar in the interval.
Recommend follow-up to resolution.

## 2021-10-09 IMAGING — DX DG CHEST 1V PORT
1 series · 1 of 1 positions shown · non-contrast
Comparison: April 27, 2020

CLINICAL DATA: [id number] positive. Shortness of breath. ETT
placement.

EXAM:
PORTABLE CHEST 1 VIEW

[chest ap]
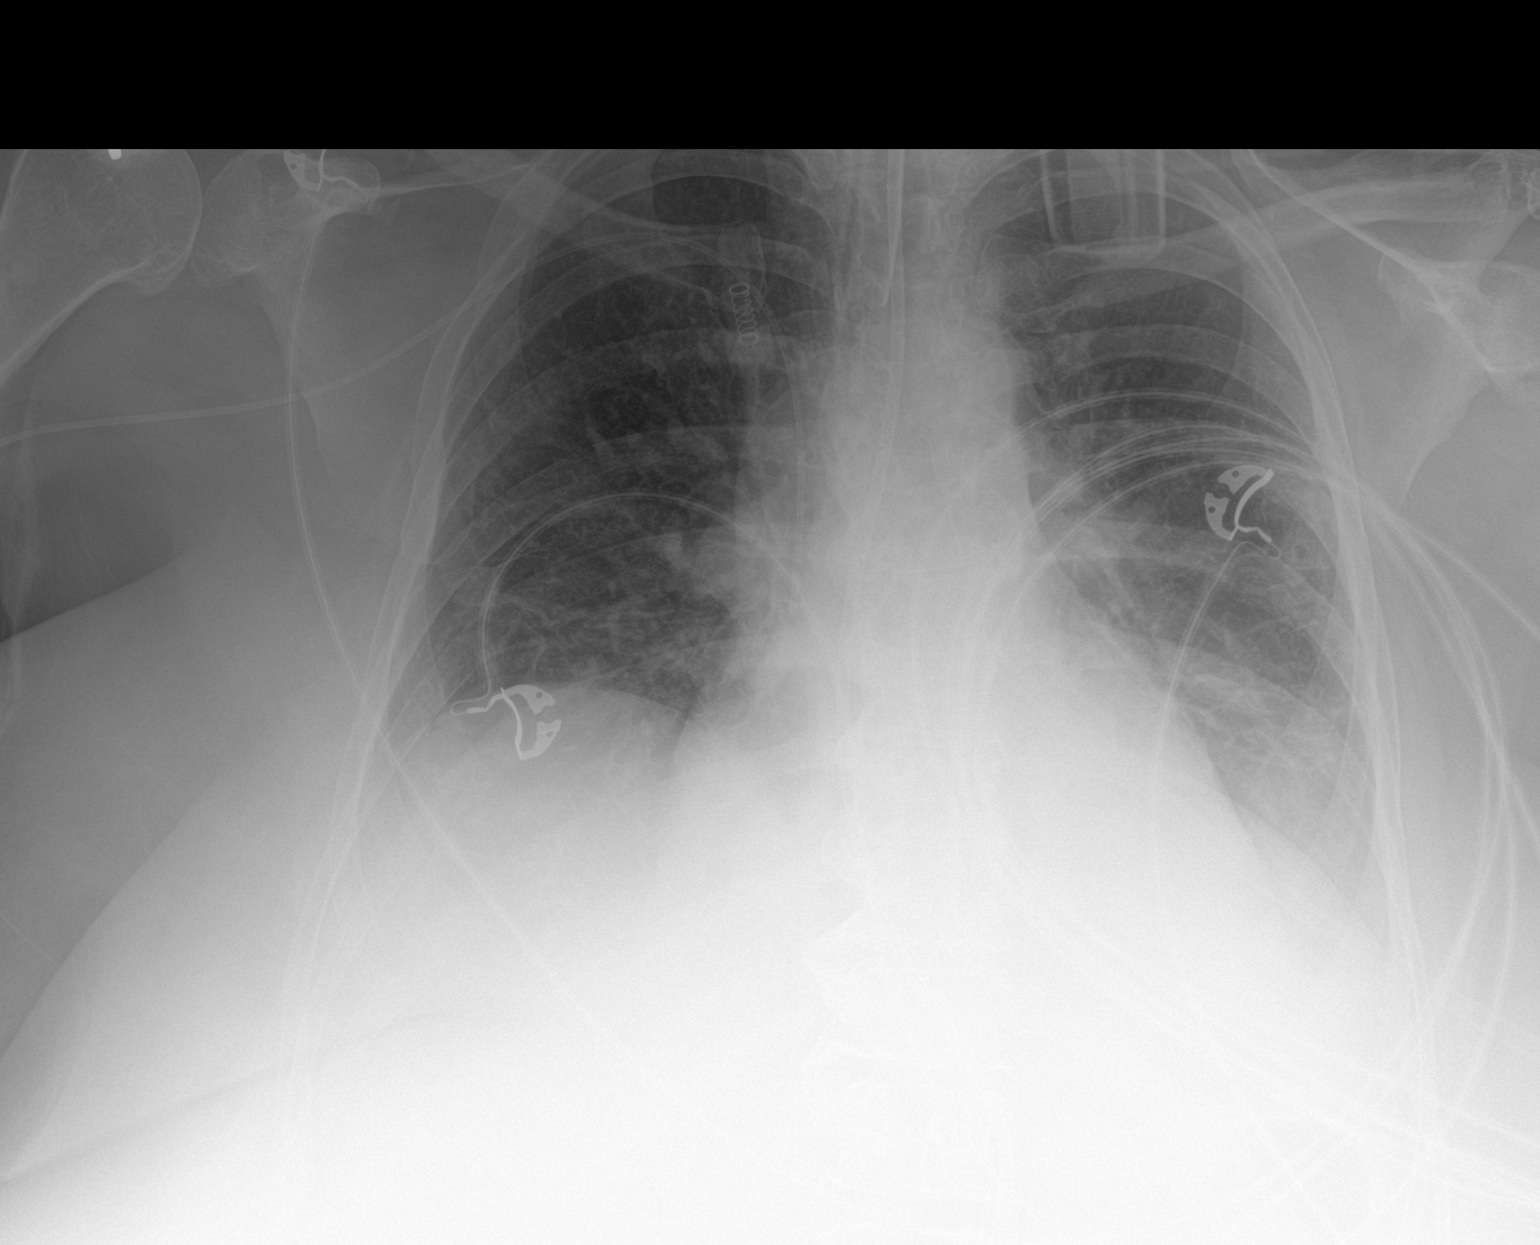

[1 of 1 positions shown; findings below may reference images not displayed]

FINDINGS: The ETT is in good position. The feeding tube is not well seen
distally due to poor penetration but appears to terminate below
today's film. Stable right PICC line. No pneumothorax. No focal
infiltrate on the right. Persistent infiltrate in the left base.
Possible layering effusion.
IMPRESSION: 1. Possible left layering effusion. Infiltrate in left base is
stable. No focal infiltrate seen on the right on today's study.
2. Support apparatus as above.

## 2021-10-13 IMAGING — DX DG CHEST 1V PORT
1 series · 1 of 1 positions shown · non-contrast
Comparison: Yesterday

CLINICAL DATA: Shortness of breath.  COVID positive

EXAM:
PORTABLE CHEST 1 VIEW

[chest ap]
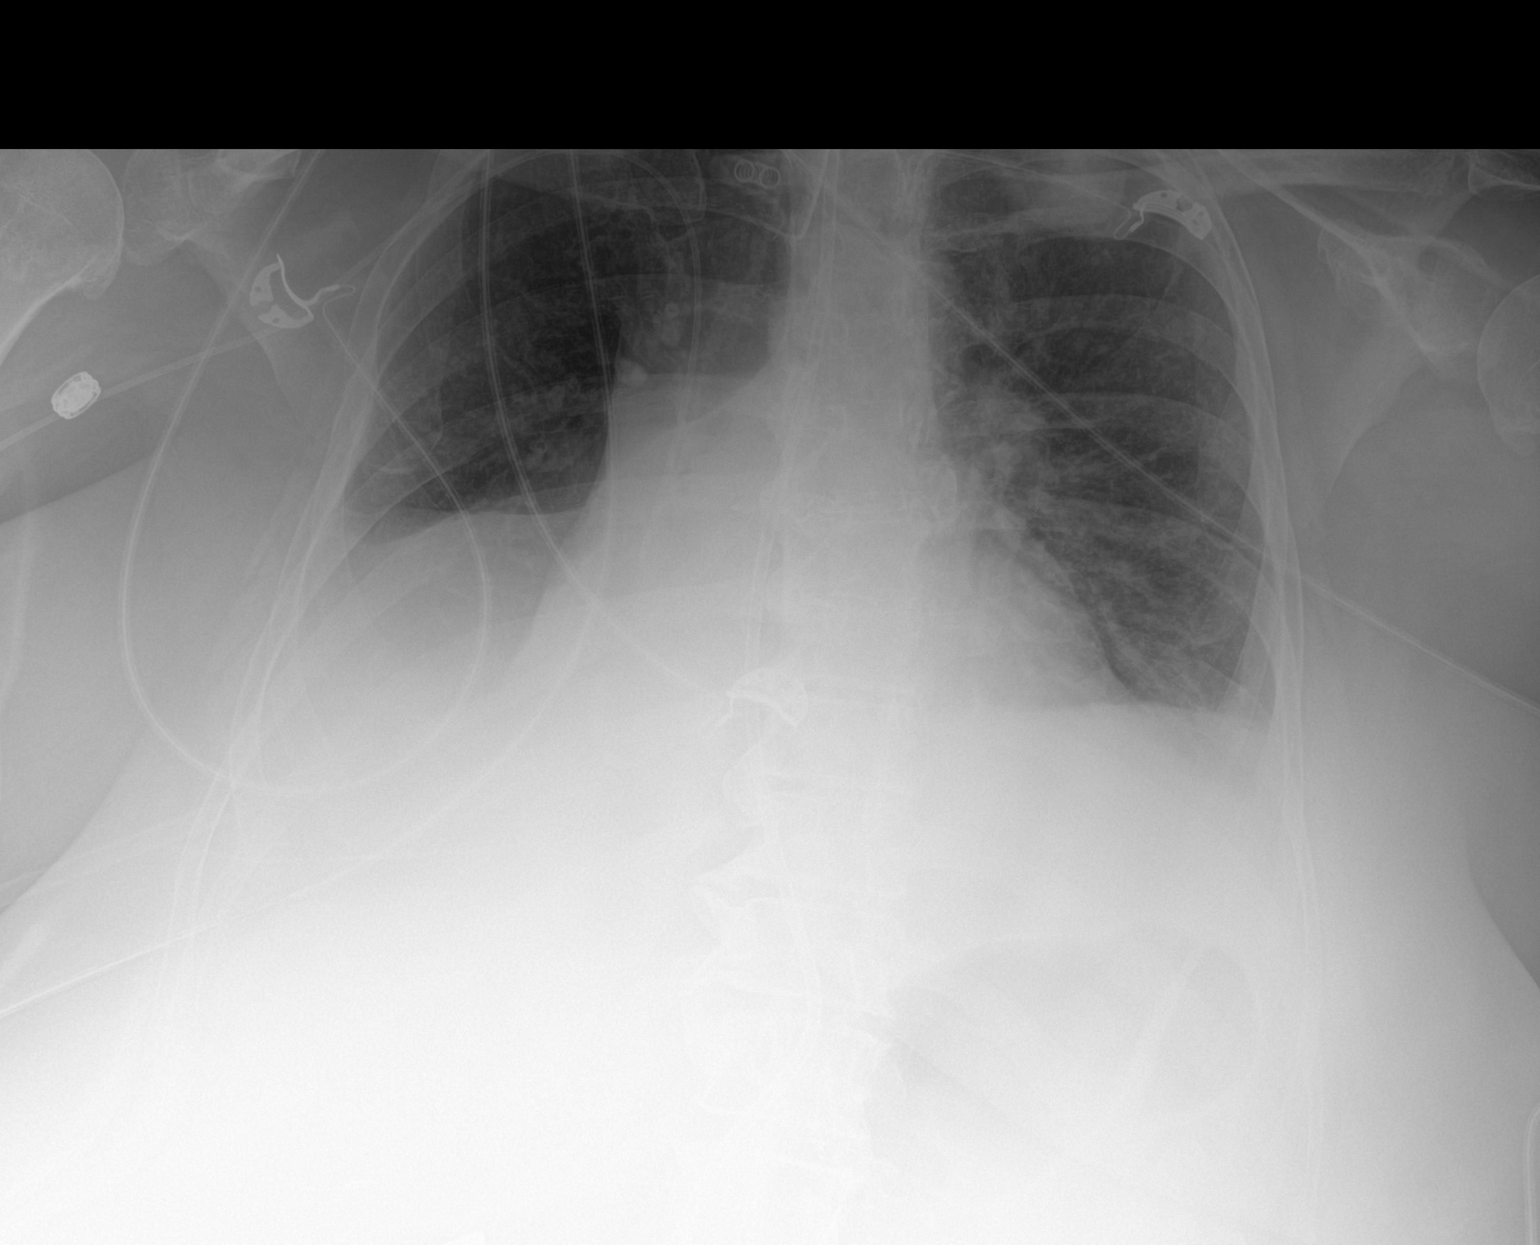

[1 of 1 positions shown; findings below may reference images not displayed]

FINDINGS: Endotracheal tube tip is at the clavicular heads. The feeding tube
at least reaches the stomach. Right PICC with tip at the upper
cavoatrial junction. Hazy density at the right base with obscured
diaphragm. Mild improvement of aeration on the left where there is
streaky type density. No visible pneumothorax. Artifact from support
hardware.
IMPRESSION: 1. Stable hardware positioning.
2. Suspect right lower lobe collapse. Generalized airspace disease
has improved since admission radiographs.

## 2021-10-15 ENCOUNTER — Other Ambulatory Visit: Payer: BC Managed Care – PPO

## 2021-10-16 ENCOUNTER — Other Ambulatory Visit: Payer: Self-pay

## 2021-10-16 ENCOUNTER — Ambulatory Visit
Admission: RE | Admit: 2021-10-16 | Discharge: 2021-10-16 | Disposition: A | Discharge status: Home / Self Care | Payer: BC Managed Care – PPO | Source: Ambulatory Visit | Attending: Orthopaedic Surgery | Admitting: Orthopaedic Surgery

## 2021-10-16 DIAGNOSIS — M545 Low back pain, unspecified: Secondary | ICD-10-CM

## 2021-10-30 ENCOUNTER — Other Ambulatory Visit (INDEPENDENT_AMBULATORY_CARE_PROVIDER_SITE_OTHER): Payer: Self-pay | Admitting: Internal Medicine

## 2021-10-30 ENCOUNTER — Ambulatory Visit: Payer: BC Managed Care – PPO | Admitting: Physician Assistant

## 2021-10-30 DIAGNOSIS — K5010 Crohn's disease of large intestine without complications: Secondary | ICD-10-CM

## 2021-11-05 ENCOUNTER — Encounter: Payer: Self-pay | Admitting: Physician Assistant

## 2021-11-05 ENCOUNTER — Other Ambulatory Visit: Payer: Self-pay

## 2021-11-05 ENCOUNTER — Ambulatory Visit: Payer: BC Managed Care – PPO | Admitting: Physician Assistant

## 2021-11-05 DIAGNOSIS — D239 Other benign neoplasm of skin, unspecified: Secondary | ICD-10-CM

## 2021-11-05 DIAGNOSIS — D485 Neoplasm of uncertain behavior of skin: Secondary | ICD-10-CM | POA: Diagnosis not present

## 2021-11-05 NOTE — Patient Instructions (Signed)

## 2021-11-05 NOTE — Progress Notes (Signed)
? ?  Follow-Up Visit ?  ?Subjective  ?[name] is a 56 y.o. female who presents for the following: Procedure Mindi Slicker left upper back mod/sev). ? ? ?The following portions of the chart were reviewed this encounter and updated as appropriate:  Tobacco  Allergies  Meds  Problems  Med Hx  Surg Hx  Fam Hx   ?  ? ?Objective  ?Well appearing patient in no apparent distress; mood and affect are within normal limits. ? ?A focused examination was performed including back. Relevant physical exam findings are noted in the Assessment and Plan. ? ?Left Upper Back ?No atypical nevi No signs of non-mole skin cancer.  ? ? ?Assessment & Plan  ?Dysplastic nevus ?Left Upper Back ? ?Epidermal / dermal shaving - Left Upper Back ? ?Lesion diameter (cm):  1.3 ?Informed consent: discussed and consent obtained   ?Timeout: patient name, date of birth, surgical site, and procedure verified   ?Procedure prep:  Patient was prepped and draped in usual sterile fashion ?Prep type:  Chlorhexidine ?Anesthesia: the lesion was anesthetized in a standard fashion   ?Anesthetic:  1% lidocaine w/ epinephrine 1-100,000 local infiltration ?Instrument used: DermaBlade   ?Hemostasis achieved with: aluminum chloride   ?Outcome: patient tolerated procedure well   ?Post-procedure details: sterile dressing applied and wound care instructions given   ?Dressing type: petrolatum gauze, petrolatum and bandage   ? ?Specimen 1 - Surgical pathology ?Differential Diagnosis: r/Io atypia ?[id number] ? ?Check Margins: yes ? ? ? ?I, Meiling Hendriks, PA-C, have reviewed all documentation's for this visit.  The documentation on 11/05/21 for the exam, diagnosis, procedures and orders are all accurate and complete. ?

## 2021-11-11 ENCOUNTER — Telehealth: Payer: Self-pay | Admitting: *Deleted

## 2021-11-11 NOTE — Telephone Encounter (Signed)
-----   Message from Warren Danes, [place] sent at 11/11/2021  9:31 AM EDT ----- ?Clear margins 6 mo fu ?

## 2021-11-11 NOTE — Telephone Encounter (Signed)
Left patient a message to call back for results.  ?

## 2021-11-11 NOTE — Telephone Encounter (Signed)
Path to patient. Patient has 6 month follow up scheduled.  ?

## 2021-11-18 ENCOUNTER — Ambulatory Visit (INDEPENDENT_AMBULATORY_CARE_PROVIDER_SITE_OTHER): Payer: BC Managed Care – PPO | Admitting: Internal Medicine

## 2022-01-29 ENCOUNTER — Other Ambulatory Visit (INDEPENDENT_AMBULATORY_CARE_PROVIDER_SITE_OTHER): Payer: Self-pay | Admitting: Gastroenterology

## 2022-01-29 ENCOUNTER — Other Ambulatory Visit (INDEPENDENT_AMBULATORY_CARE_PROVIDER_SITE_OTHER): Payer: Self-pay | Admitting: Internal Medicine

## 2022-01-29 NOTE — Telephone Encounter (Signed)
Last seen 04/03/20. Has appt in june

## 2022-01-29 NOTE — Telephone Encounter (Signed)
Last seen 04/03/20

## 2022-02-05 ENCOUNTER — Encounter (INDEPENDENT_AMBULATORY_CARE_PROVIDER_SITE_OTHER): Payer: Self-pay | Admitting: Gastroenterology

## 2022-02-05 ENCOUNTER — Ambulatory Visit (INDEPENDENT_AMBULATORY_CARE_PROVIDER_SITE_OTHER): Payer: BC Managed Care – PPO | Admitting: Gastroenterology

## 2022-02-05 VITALS — BP 117/73 | HR 96 | Temp 98.2°F | Ht 63.0 in | Wt 269.6 lb

## 2022-02-05 DIAGNOSIS — K5010 Crohn's disease of large intestine without complications: Secondary | ICD-10-CM | POA: Diagnosis not present

## 2022-02-05 DIAGNOSIS — K50119 Crohn's disease of large intestine with unspecified complications: Secondary | ICD-10-CM

## 2022-02-05 MED ORDER — MESALAMINE ER 500 MG PO CPCR: ORAL_CAPSULE | ORAL | 11 refills | Status: DC

## 2022-02-05 MED ORDER — MERCAPTOPURINE 50 MG PO TABS: 75.0000 mg | ORAL_TABLET | Freq: Every day | ORAL | 5 refills | Status: DC

## 2022-02-05 NOTE — Patient Instructions (Addendum)
Perform blood workup Perform stool workup Continue 6-MP 75 mg qday Continue Pentasa 1 g every 6 hours Can read about Stelara, Skyrizi or [employer] as treatments for Crohn's disease

## 2022-02-05 NOTE — Progress Notes (Signed)
[name], M.D. Gastroenterology & Hepatology [facility] [address] [place], [place] [address]  Primary Care Physician: Caryl Bis, MD [place] [id number]  I will communicate my assessment and recommendations to the referring MD via EMR.  Problems: Crohn's colitis  History of Present Illness: NOTNAMED CROUCHER is a 56 y.o. female with past medical history of Crohn's colitis, asthma, anxiety, pulmonary embolism and DVT due to COVID, sleep apnea, obesity, IBS, hyperlipidemia, hypertension, who presents for follow up of Crohn's disease.  The patient was last seen on 04/03/2020. At that time, the patient was continued on Pentasa and 6-MP.  Patient was diagnosed with Crohn's colitis in 1999.  Since then she has been on Pentasa and 6-MP for multiple years.She had Remicade in the past but had joint issues.  Patient reports she feels well and denies any complaints. Has 1-2 Bms per day which is formed. The patient denies having any nausea, vomiting, fever, chills, hematochezia, melena, hematemesis, abdominal distention, abdominal pain, diarrhea, jaundice, pruritus. Has lost 31 lb since she started using Mounjaro.  Most recent labs from 10/07/2021 showed an AST of 20, ALT of 26, total bilirubin 0.7, normal creatinine 0.57, alkaline phosphatase 65, normal electrolytes, CBC with white blood cell count 6.4, hemoglobin 15.5, platelets 277.  Patient reports that she has not required prednisone for >5 years.  Last flu shot: 2 years ago Last pneumonia shot: yes, <5 years ago Last Pap smear: had one in the past, has one due next week Last evaluation by dermatology: 10/2021, has an appointment in 05/2021 Last zoster vaccine: yes, had it in the past Last DEXA scan: had it in March 2023 , normal per patient COVID-19 shot: Moderna  Last EGD: Last Colonoscopy: 05/14/2021 - performed by Dr. Laural Golden - Three diminutive polyps in the  transverse colon and at the hepatic flexure. Biopsied. - One 1 mm polyp in the proximal transverse colon, removed with a cold snare. Resected and retrieved. No TI intubation performed.  No active inflammation in the colon  Path: A. COLON, TRANSVERSE, HEPATIC FLEXURE, POLYPECTOMY:  - Polypoid colonic mucosa with a prominent lymphoid aggregate  - Negative for dysplasia  - Multiple step sections were examined   Past Medical History: Past Medical History:  Diagnosis Date   Anxiety    No medications currently   Arthritis    Asthma    Atypical mole 06/21/2015   mod/ sev left upper back tx w/s   Atypical mole 06/21/2015   mod/sev left post shoulder med tx w/s   Atypical mole 06/21/2015   mod/ sev left post shjouldeer lateral  tx w/s   Atypical mole 08/01/2015   mild right upper back no tx   Atypical mole 09/16/2021   left upper back (moderate to severe)   Bilateral carpal tunnel syndrome    COVID 04/08/2020   VDRF ARDS   04/16/20-extubated 05/04/2020   Crohn disease ([place])    Diabetes mellitus    diet controlled   DVT (deep venous thrombosis) ([place]) 05/30/2020   Heart murmur    mild   High cholesterol    Hypertension    pcp  Dr Coralyn Mark Quillian Qu[place]    [place]   IBS (irritable bowel syndrome)    Obesity    Pneumonia 2011   History of   Pulmonary embolism ([place]) 05/30/2020   DVT/PE    Sleep apnea 03-2009 study   pt forgot to mention at pre admit visit.  uses c-pap brought her own  machine from home.    Trigger thumb, right thumb     Past Surgical History: Past Surgical History:  Procedure Laterality Date   CARDIAC CATHETERIZATION  05/30/2020   Removal pulmonary embolus    [facility]   CARPAL TUNNEL RELEASE Right 09/20/2019   Procedure: CARPAL TUNNEL RELEASE;  Surgeon: Hiram Gash, MD;  Location: [facility];  Service: Orthopedics;  Laterality: Right;   CARPAL TUNNEL RELEASE Left 11/15/2019   Procedure: CARPAL TUNNEL RELEASE, EXCISION OF GANGLION CYST;  Surgeon: Hiram Gash, MD;  Location:  [facility];  Service: Orthopedics;  Laterality: Left;   CHOLECYSTECTOMY N/A 03/31/2013   Procedure: LAPAROSCOPIC CHOLECYSTECTOMY;  Surgeon: Jamesetta So, MD;  Location: [facility];  Service: General;  Laterality: N/A;   COLONOSCOPY N/A 03/16/2013   Procedure: COLONOSCOPY;  Surgeon: Rogene Houston, MD;  [facility];  Service: Endoscopy;  Laterality: N/A;  255   COLONOSCOPY N/A 12/31/2014   Procedure: COLONOSCOPY;  Surgeon: Rogene [place], MD;  [facility];  Service: Endoscopy;  Laterality: N/A;  730   COLONOSCOPY WITH PROPOFOL N/A 05/14/2021   Procedure: COLONOSCOPY WITH PROPOFOL;  Surgeon: Rogene Houston, MD;  [facility];  Service: Endoscopy;  Laterality: N/A;  915   CYST REMOVAL HAND     cyst removed from left hand     FLEXIBLE SIGMOIDOSCOPY N/A 08/21/2014   Procedure: FLEXIBLE SIGMOIDOSCOPY;  Surgeon: Rogene Houston, MD;  [facility];  Service: Endoscopy;  Laterality: N/A;  1030   POLYPECTOMY  05/14/2021   Procedure: POLYPECTOMY;  Surgeon: Rogene Houston, MD;  [facility];  Service: Endoscopy;;   TONSILLECTOMY     TOTAL HIP ARTHROPLASTY  04/18/2012   Procedure: TOTAL HIP ARTHROPLASTY;  Surgeon: Marybelle Killings, MD;  Location: [place];  Service: Orthopedics;  Laterality: Right;  Right Total Hip Arthroplasty   TOTAL SHOULDER ARTHROPLASTY Left 07/09/2021   Procedure: TOTAL SHOULDER ARTHROPLASTY;  Surgeon: Hiram Gash, MD;  Location: [facility];  Service: Orthopedics;  Laterality: Left;   TRIGGER FINGER RELEASE Left 07/09/2021   Procedure: RELEASE TRIGGER FINGER/A-1 PULLEY;  Surgeon: Hiram Gash, MD;  Location: [facility];  Service: Orthopedics;  Laterality: Left;    Family History:History reviewed. No pertinent family history.  Social History: Social History   Tobacco Use  Smoking Status Never  Smokeless Tobacco Never   Social History   Substance and Sexual Activity  Alcohol Use No   Alcohol/week: 0.0 standard drinks of alcohol    Social History   Substance and Sexual Activity  Drug Use No    Allergies: Allergies  Allergen Reactions   Remicade [Infliximab] Swelling    Joints locked up   Cefprozil Rash    Tolerates Ancef   Penicillins Rash    Did it involve swelling of the face/tongue/throat, SOB, or low BP? No Did it involve sudden or severe rash/hives, skin peeling, or any reaction on the inside of your mouth or nose? No Did you need to seek medical attention at a hospital or doctor's office? No When did it last happen?       If all above answers are "NO", may proceed with cephalosporin use.     Medications: Current Outpatient Medications  Medication Sig Dispense Refill   albuterol (PROVENTIL) (2.5 MG/3ML) 0.083% nebulizer solution Inhale 2.5 mg into the lungs daily as needed for shortness of breath or wheezing.     albuterol (VENTOLIN  HFA) 108 (90 Base) MCG/ACT inhaler Inhale 2 puffs into the lungs every 4 (four) hours as needed for wheezing or shortness of breath.     Calcium Carb-Cholecalciferol (CALCIUM 600/VITAMIN D3 PO) Take 1,200 mg by mouth daily.     dicyclomine (BENTYL) 20 MG tablet TAKE ONE TABLET BY MOUTH THREE TIMES DAILY AS NEEDED FOR SPASMS. 90 tablet 5   DULoxetine (CYMBALTA) 60 MG capsule Take 1 capsule (60 mg total) by mouth daily. (Patient taking differently: Take 120 mg by mouth daily.) 5 capsule 0   fluticasone (FLONASE) 50 MCG/ACT nasal spray Place 1 spray into both nostrils daily as needed for allergies or rhinitis.     Fluticasone-Salmeterol (ADVAIR) 250-50 MCG/DOSE AEPB Inhale 1 puff into the lungs daily.     JARDIANCE 25 MG TABS tablet Take 25 mg by mouth daily.      levocetirizine (XYZAL) 5 MG tablet Take 5 mg by mouth every evening.     lisinopril-hydrochlorothiazide (ZESTORETIC) 20-12.5 MG tablet Take 2 tablets by mouth daily.     MAGNESIUM OXIDE PO Take 1,000 mg by mouth at bedtime.     mercaptopurine (PURINETHOL) 50 MG tablet TAKE 1 AND 1/2 TABLETS BY MOUTH DAILY ON EMPTY  STOMACH ONE HOUR PRIOR TO A MEAL OR TWO HOURS AFTER A MEAL. 45 tablet 5   mesalamine (PENTASA) 500 MG CR capsule TAKE TWO (2) CAPSULES BY MOUTH FOUR TIMES DAILY 240 capsule 11   MOUNJARO 2.5 MG/0.5ML Pen SMARTSIG:0.5 Milliliter(s) SUB-Q Once a Week     Multiple Vitamin (MULITIVITAMIN WITH MINERALS) TABS Take 1 tablet by mouth every morning.      Probiotic Product (PROBIOTIC DAILY PO) Take 200 mg by mouth daily.     rosuvastatin (CRESTOR) 20 MG tablet Take 20 mg by mouth at bedtime.     traZODone (DESYREL) 50 MG tablet Take 1 tablet (50 mg total) by mouth at bedtime as needed for sleep. (Patient taking differently: Take 100 mg by mouth at bedtime as needed for sleep.) 5 tablet 0   No current facility-administered medications for this visit.    Review of Systems: GENERAL: negative for malaise, night sweats HEENT: No changes in hearing or vision, no nose bleeds or other nasal problems. NECK: Negative for lumps, goiter, pain and significant neck swelling RESPIRATORY: Negative for cough, wheezing CARDIOVASCULAR: Negative for chest pain, leg swelling, palpitations, orthopnea GI: SEE HPI MUSCULOSKELETAL: Negative for joint pain or swelling, back pain, and muscle pain. SKIN: Negative for lesions, rash PSYCH: Negative for sleep disturbance, mood disorder and recent psychosocial stressors. HEMATOLOGY Negative for prolonged bleeding, bruising easily, and swollen nodes. ENDOCRINE: Negative for cold or heat intolerance, polyuria, polydipsia and goiter. NEURO: negative for tremor, gait imbalance, syncope and seizures. The remainder of the review of systems is noncontributory.   Physical Exam: BP 117/73 (BP Location: Left Arm, Patient Position: Sitting, Cuff Size: Large)   Pulse 96   Temp 98.2 F (36.8 C) (Oral)   Ht 5' 3"  (1.6 m)   Wt 269 lb 9.6 oz (122.3 kg)   BMI 47.76 kg/m  GENERAL: The patient is AO x3, in no acute distress. HEENT: Head is normocephalic and atraumatic. EOMI are intact.  Mouth is well hydrated and without lesions. NECK: Supple. No masses LUNGS: Clear to auscultation. No presence of rhonchi/wheezing/rales. Adequate chest expansion HEART: RRR, normal s1 and s2. ABDOMEN: Soft, nontender, no guarding, no peritoneal signs, and nondistended. BS +. No masses. EXTREMITIES: Without any cyanosis, clubbing, rash, lesions or edema. NEUROLOGIC: AOx3, no  focal motor deficit. SKIN: no jaundice, no rashes  Imaging/Labs: as above  I personally reviewed and interpreted the available labs, imaging and endoscopic files.  Impression and Plan: KEYLEN UZELAC is a 56 y.o. female with past medical history of Crohn's colitis, asthma, anxiety, pulmonary embolism and DVT due to COVID, sleep apnea, obesity, IBS, hyperlipidemia, hypertension, who presents for follow up of Crohn's disease.  The patient has presented a chronic history of Crohn's colitis which has been managed with 6-MP and Pentasa for multiple years.  It seems that the patient has a chief clinical remission and based on her most recent colonoscopy she also reach histologic remission.  However, she is concerned about the potential side effects of this medicine after using it for multiple years.  We had a thorough discussion about the possible medications that we can use for mild to moderate Crohn's disease of the colon, she will be interested in using medications to have an adequate efficacy but with low risk of side effects.  She will read about Larinda Buttery or [place] and we will reconvene about possibly switching her medications.  We will perform surveillance labs today including CRP and stool testing to assess noninvasively her disease activity.  She will need to have a repeat colonoscopy in September 2025 for colorectal cancer screening  -Check CBC, CMP, CRP and vitamin D -Check fecal calprotectin - Continue 6-MP 75 mg qday - Continue Pentasa 1 g every 6 hours -We will check QuantiFERON and hepatitis B in  follow-up appointment if she decides to switch her therapy -Recommend repeat colonoscopy in September 2025 for colorectal cancer screening  All questions were answered.      Harvel Quale, MD Gastroenterology and Hepatology [facility]

## 2022-02-06 LAB — CBC WITH DIFFERENTIAL/PLATELET
Absolute Monocytes: 534 cells/uL (ref 200–950)
Basophils Absolute: 17 cells/uL (ref 0–200)
Basophils Relative: 0.3 %
Eosinophils Absolute: 93 cells/uL (ref 15–500)
Eosinophils Relative: 1.6 %
HCT: 44.4 % (ref 35.0–45.0)
Hemoglobin: 14.8 g/dL (ref 11.7–15.5)
Lymphs Abs: 1334 cells/uL (ref 850–3900)
MCH: 33.5 pg — ABNORMAL HIGH (ref 27.0–33.0)
MCHC: 33.3 g/dL (ref 32.0–36.0)
MCV: 100.5 fL — ABNORMAL HIGH (ref 80.0–100.0)
MPV: 9.9 fL (ref 7.5–12.5)
Monocytes Relative: 9.2 %
Neutro Abs: 3822 cells/uL (ref 1500–7800)
Neutrophils Relative %: 65.9 %
Platelets: 277 10*3/uL (ref 140–400)
RBC: 4.42 10*6/uL (ref 3.80–5.10)
RDW: 12.7 % (ref 11.0–15.0)
Total Lymphocyte: 23 %
WBC: 5.8 10*3/uL (ref 3.8–10.8)

## 2022-02-06 LAB — COMPREHENSIVE METABOLIC PANEL
AG Ratio: 1.6 (calc) (ref 1.0–2.5)
ALT: 27 U/L (ref 6–29)
AST: 19 U/L (ref 10–35)
Albumin: 3.9 g/dL (ref 3.6–5.1)
Alkaline phosphatase (APISO): 57 U/L (ref 37–153)
BUN: 15 mg/dL (ref 7–25)
CO2: 29 mmol/L (ref 20–32)
Calcium: 9.2 mg/dL (ref 8.6–10.4)
Chloride: 105 mmol/L (ref 98–110)
Creat: 0.56 mg/dL (ref 0.50–1.03)
Globulin: 2.5 g/dL (calc) (ref 1.9–3.7)
Glucose, Bld: 93 mg/dL (ref 65–99)
Potassium: 4.2 mmol/L (ref 3.5–5.3)
Sodium: 143 mmol/L (ref 135–146)
Total Bilirubin: 0.6 mg/dL (ref 0.2–1.2)
Total Protein: 6.4 g/dL (ref 6.1–8.1)

## 2022-02-06 LAB — VITAMIN D 25 HYDROXY (VIT D DEFICIENCY, FRACTURES): Vit D, 25-Hydroxy: 48 ng/mL (ref 30–100)

## 2022-02-06 LAB — C-REACTIVE PROTEIN: CRP: 2.2 mg/L (ref <8.0)

## 2022-02-23 LAB — CALPROTECTIN: Calprotectin: 15 mcg/g

## 2022-03-09 ENCOUNTER — Other Ambulatory Visit (INDEPENDENT_AMBULATORY_CARE_PROVIDER_SITE_OTHER): Payer: Self-pay | Admitting: Gastroenterology

## 2022-03-09 ENCOUNTER — Telehealth (INDEPENDENT_AMBULATORY_CARE_PROVIDER_SITE_OTHER): Payer: Self-pay

## 2022-03-09 DIAGNOSIS — K50119 Crohn's disease of large intestine with unspecified complications: Secondary | ICD-10-CM

## 2022-03-09 MED ORDER — MESALAMINE 1.2 G PO TBEC: 2.4000 g | DELAYED_RELEASE_TABLET | Freq: Every day | ORAL | 1 refills | Status: DC

## 2022-03-09 NOTE — Telephone Encounter (Signed)
Per patient her drug store called her this morning told her that the generic Pentasa was on back order and insurance would not pay for the name brand. She has a week worth of medication left,and says you had mentioned to her about switching to another medication at her last visit. She says she did a little investigation on the different meds and did not come to a conclusion of which she would prefer. Please advise.

## 2022-03-09 NOTE — Telephone Encounter (Signed)
Thanks, I will put her on Lialda for now and she should continue 6-MP as well. If she wants to discuss the medication options, she can make an early appointment to discuss this.

## 2022-03-10 NOTE — Telephone Encounter (Signed)
Patient aware of all, she says she will try the new medication Lialda, and will let us know is she thinks she needs an appointment to discuss other options.

## 2022-03-10 NOTE — Telephone Encounter (Signed)
I called and left a message asked that patient please return call.

## 2022-05-13 ENCOUNTER — Ambulatory Visit: Payer: BC Managed Care – PPO | Admitting: Physician Assistant

## 2022-07-02 ENCOUNTER — Other Ambulatory Visit (INDEPENDENT_AMBULATORY_CARE_PROVIDER_SITE_OTHER): Payer: Self-pay | Admitting: Gastroenterology

## 2022-07-02 DIAGNOSIS — K50119 Crohn's disease of large intestine with unspecified complications: Secondary | ICD-10-CM

## 2022-07-31 ENCOUNTER — Other Ambulatory Visit (INDEPENDENT_AMBULATORY_CARE_PROVIDER_SITE_OTHER): Payer: Self-pay | Admitting: Internal Medicine

## 2022-08-10 ENCOUNTER — Ambulatory Visit (INDEPENDENT_AMBULATORY_CARE_PROVIDER_SITE_OTHER): Payer: BC Managed Care – PPO | Admitting: Gastroenterology

## 2022-08-10 ENCOUNTER — Encounter (INDEPENDENT_AMBULATORY_CARE_PROVIDER_SITE_OTHER): Payer: Self-pay | Admitting: Gastroenterology

## 2022-08-10 VITALS — BP 109/73 | HR 103 | Temp 98.2°F | Ht 63.0 in | Wt 248.7 lb

## 2022-08-10 DIAGNOSIS — K50119 Crohn's disease of large intestine with unspecified complications: Secondary | ICD-10-CM

## 2022-08-10 NOTE — Progress Notes (Signed)
[name], M.D. Gastroenterology & Hepatology [facility] [address] [place], [place] [address]  Primary Care Physician: Caryl Bis, MD [place] [id number]  I will communicate my assessment and recommendations to the referring MD via EMR.  Problems: Crohn's colitis   History of Present Illness: [name] is a 56 y.o. female with past medical history of Crohn's colitis, asthma, anxiety, pulmonary embolism and DVT due to COVID, sleep apnea, obesity, IBS, hyperlipidemia, hypertension, who presents for follow up of Crohn's disease.  The patient was last seen on 02/05/2022. At that time, the patient was advised to read about Larinda Buttery or Weyman Rodney so she could potentially switch to another medication.  However, calprotectin was normal at 50 mcg/g.  The patient was advised to.  The patient was advised to continue on 6-MP 75 mg every day and Pentasa 1 g every 6 hours. She has been taking 6-MP compliantly and was switched to Lialda 2.4 q day due to insurance coverage.  Patient was diagnosed with Crohn's colitis in 1999.  Since then she has been on Pentasa and 6-MP for multiple years.She had Remicade in the past but had joint issues.   Patient reports that for the last 3 weeks she has presented worsening diarrhea and abdominal pain in the upper abdomen and right side of the abdomen. She is having 4-5 Bms per day, which have a watery to very loose consistency.  She has had some intermittent nausea. States that pior to the onset of her symptoms, she was feeling well. The patient denies having any nausea, vomiting, fever, chills, hematochezia, melena, hematemesis, abdominal distention, jaundice, pruritus or weight loss.  No sick contacts or recent travels.  Last flu shot: 2022 Last pneumonia shot: yes, <5 years ago Last Pap smear: 03/2022 Last evaluation by dermatology: 10/2021 Last zoster vaccine: yes, had it in the  past Last DEXA scan: had it in March 2023 , normal per patient COVID-19 shot: Moderna   Last Colonoscopy: 05/14/2021 - performed by Dr. Laural Golden - Three diminutive polyps in the transverse colon and at the hepatic flexure. Biopsied. - One 1 mm polyp in the proximal transverse colon, removed with a cold snare. Resected and retrieved. No TI intubation performed.   No active inflammation in the colon   Path: A. COLON, TRANSVERSE, HEPATIC FLEXURE, POLYPECTOMY:  - Polypoid colonic mucosa with a prominent lymphoid aggregate  - Negative for dysplasia  - Multiple step sections were examined   Past Medical History: Past Medical History:  Diagnosis Date   Anxiety    No medications currently   Arthritis    Asthma    Atypical mole 06/21/2015   mod/ sev left upper back tx w/s   Atypical mole 06/21/2015   mod/sev left post shoulder med tx w/s   Atypical mole 06/21/2015   mod/ sev left post shjouldeer lateral  tx w/s   Atypical mole 08/01/2015   mild right upper back no tx   Atypical mole 09/16/2021   left upper back (moderate to severe)   Bilateral carpal tunnel syndrome    COVID 04/08/2020   VDRF ARDS   04/16/20-extubated 05/04/2020   Crohn disease ([place])    Diabetes mellitus    diet controlled   DVT (deep venous thrombosis) ([place]) 05/30/2020   Heart murmur    mild   High cholesterol    Hypertension    pcp  Dr Coralyn Mark Quillian [facility]    [place]   IBS (  irritable bowel syndrome)    Obesity    Pneumonia 2011   History of   Pulmonary embolism ([place]) 05/30/2020   DVT/PE    Sleep apnea 03-2009 study   pt forgot to mention at pre admit visit.  uses c-pap brought her own  machine from home.    Trigger thumb, right thumb     Past Surgical History: Past Surgical History:  Procedure Laterality Date   CARDIAC CATHETERIZATION  05/30/2020   Removal pulmonary embolus    [facility]   CARPAL TUNNEL RELEASE Right 09/20/2019   Procedure: CARPAL TUNNEL RELEASE;  Surgeon: Hiram Gash, MD;  Location: [facility];   Service: Orthopedics;  Laterality: Right;   CARPAL TUNNEL RELEASE Left 11/15/2019   Procedure: CARPAL TUNNEL RELEASE, EXCISION OF GANGLION CYST;  Surgeon: Hiram Gash, MD;  Location: [facility];  Service: Orthopedics;  Laterality: Left;   CHOLECYSTECTOMY N/A 03/31/2013   Procedure: LAPAROSCOPIC CHOLECYSTECTOMY;  Surgeon: Jamesetta So, MD;  Location: [facility];  Service: General;  Laterality: N/A;   COLONOSCOPY N/A 03/16/2013   Procedure: COLONOSCOPY;  Surgeon: Rogene Houston, MD;  [facility];  Service: Endoscopy;  Laterality: N/A;  255   COLONOSCOPY N/A 12/31/2014   Procedure: COLONOSCOPY;  Surgeon: Rogene Houston, MD;  [facility];  Service: Endoscopy;  Laterality: N/A;  730   COLONOSCOPY WITH PROPOFOL N/A 05/14/2021   Procedure: COLONOSCOPY WITH PROPOFOL;  Surgeon: Rogene Houston, MD;  [facility];  Service: Endoscopy;  Laterality: N/A;  915   CYST REMOVAL HAND     cyst removed from left hand     FLEXIBLE SIGMOIDOSCOPY N/A 08/21/2014   Procedure: FLEXIBLE SIGMOIDOSCOPY;  Surgeon: Rogene Houston, MD;  [facility];  Service: Endoscopy;  Laterality: N/A;  1030   POLYPECTOMY  05/14/2021   Procedure: POLYPECTOMY;  Surgeon: Rogene Houston, MD;  [facility];  Service: Endoscopy;;   TONSILLECTOMY     TOTAL HIP ARTHROPLASTY  04/18/2012   Procedure: TOTAL HIP ARTHROPLASTY;  Surgeon: Marybelle Killings, MD;  Location: [facility];  Service: Orthopedics;  Laterality: Right;  Right Total Hip Arthroplasty   TOTAL SHOULDER ARTHROPLASTY Left 07/09/2021   Procedure: TOTAL SHOULDER ARTHROPLASTY;  Surgeon: Hiram Gash, MD;  Location: [facility];  Service: Orthopedics;  Laterality: Left;   TRIGGER FINGER RELEASE Left 07/09/2021   Procedure: RELEASE TRIGGER FINGER/A-1 PULLEY;  Surgeon: Hiram Gash, MD;  Location: [facility];  Service: Orthopedics;  Laterality: Left;    Family History:History reviewed. No pertinent family history.  Social History: Social History    Tobacco Use  Smoking Status Never  Smokeless Tobacco Never   Social History   Substance and Sexual Activity  Alcohol Use No   Alcohol/week: 0.0 standard drinks of alcohol   Social History   Substance and Sexual Activity  Drug Use No    Allergies: Allergies  Allergen Reactions   Remicade [Infliximab] Swelling    Joints locked up   Cefprozil Rash    Tolerates Ancef   Penicillins Rash    Did it involve swelling of the face/tongue/throat, SOB, or low BP? No Did it involve sudden or severe rash/hives, skin peeling, or any reaction on the inside of your mouth or nose? No Did you need to seek medical attention at a hospital or doctor's office? No When did it last happen?       If all above answers are "NO", may proceed with cephalosporin use.  Medications: Current Outpatient Medications  Medication Sig Dispense Refill   albuterol (PROVENTIL) (2.5 MG/3ML) 0.083% nebulizer solution Inhale 2.5 mg into the lungs daily as needed for shortness of breath or wheezing.     albuterol (VENTOLIN HFA) 108 (90 Base) MCG/ACT inhaler Inhale 2 puffs into the lungs every 4 (four) hours as needed for wheezing or shortness of breath.     Calcium Carb-Cholecalciferol (CALCIUM 600/VITAMIN D3 PO) Take 1,200 mg by mouth daily.     dicyclomine (BENTYL) 20 MG tablet TAKE ONE TABLET BY MOUTH THREE TIMES DAILY AS NEEDED FOR SPASMS. 90 tablet 5   DULoxetine (CYMBALTA) 60 MG capsule Take 1 capsule (60 mg total) by mouth daily. (Patient taking differently: Take 120 mg by mouth daily.) 5 capsule 0   fluticasone (FLONASE) 50 MCG/ACT nasal spray Place 1 spray into both nostrils daily as needed for allergies or rhinitis.     Fluticasone-Salmeterol (ADVAIR) 250-50 MCG/DOSE AEPB Inhale 1 puff into the lungs daily.     JARDIANCE 25 MG TABS tablet Take 25 mg by mouth daily.      levocetirizine (XYZAL) 5 MG tablet Take 5 mg by mouth every evening.     lisinopril-hydrochlorothiazide (ZESTORETIC) 20-12.5 MG tablet  Take 2 tablets by mouth daily.     MAGNESIUM OXIDE PO Take 1,000 mg by mouth at bedtime.     mercaptopurine (PURINETHOL) 50 MG tablet Take 1.5 tablets (75 mg total) by mouth daily. Give on an empty stomach 1 hour before or 2 hours after meals. Caution: Chemotherapy. 45 tablet 5   mesalamine (LIALDA) 1.2 g EC tablet TAKE TWO (2) TABLETS BY MOUTH DAILY WITH BREAKFAST 180 tablet 1   Multiple Vitamin (MULITIVITAMIN WITH MINERALS) TABS Take 1 tablet by mouth every morning.      Probiotic Product (PROBIOTIC DAILY PO) Take 200 mg by mouth daily.     rosuvastatin (CRESTOR) 20 MG tablet Take 20 mg by mouth at bedtime.     Semaglutide (RYBELSUS PO) Take 7 mg by mouth daily at 6 (six) AM.     traZODone (DESYREL) 50 MG tablet Take 1 tablet (50 mg total) by mouth at bedtime as needed for sleep. (Patient taking differently: Take 100 mg by mouth at bedtime as needed for sleep.) 5 tablet 0   No current facility-administered medications for this visit.    Review of Systems: GENERAL: negative for malaise, night sweats HEENT: No changes in hearing or vision, no nose bleeds or other nasal problems. NECK: Negative for lumps, goiter, pain and significant neck swelling RESPIRATORY: Negative for cough, wheezing CARDIOVASCULAR: Negative for chest pain, leg swelling, palpitations, orthopnea GI: SEE HPI MUSCULOSKELETAL: Negative for joint pain or swelling, back pain, and muscle pain. SKIN: Negative for lesions, rash PSYCH: Negative for sleep disturbance, mood disorder and recent psychosocial stressors. HEMATOLOGY Negative for prolonged bleeding, bruising easily, and swollen nodes. ENDOCRINE: Negative for cold or heat intolerance, polyuria, polydipsia and goiter. NEURO: negative for tremor, gait imbalance, syncope and seizures. The remainder of the review of systems is noncontributory.   Physical Exam: BP 109/73 (BP Location: Left Arm, Patient Position: Sitting, Cuff Size: Large)   Pulse (!) 103   Temp 98.2 F  (36.8 C) (Temporal)   Ht 5' 3"  (1.6 m)   Wt 248 lb 11.2 oz (112.8 kg)   BMI 44.06 kg/m  GENERAL: The patient is AO x3, in no acute distress. Obese  HEENT: Head is normocephalic and atraumatic. EOMI are intact. Mouth is well hydrated and without lesions. NECK:  Supple. No masses LUNGS: Clear to auscultation. No presence of rhonchi/wheezing/rales. Adequate chest expansion HEART: RRR, normal s1 and s2. ABDOMEN tender to palpation in the RUQ and RLQ area, no guarding, no peritoneal signs, and nondistended. BS +. No masses. EXTREMITIES: Without any cyanosis, clubbing, rash, lesions or edema. NEUROLOGIC: AOx3, no focal motor deficit. SKIN: no jaundice, no rashes  Imaging/Labs: as above  I personally reviewed and interpreted the available labs, imaging and endoscopic files.  Impression and Plan: SHANDORA KOOGLER is a 56 y.o. female with past medical history of Crohn's colitis, asthma, anxiety, pulmonary embolism and DVT due to COVID, sleep apnea, obesity, IBS, hyperlipidemia, hypertension, who presents for follow up of Crohn's disease. The patient has been managed chronically for her Crohn's colitis recommendation of 6-MP and Lialda, which also led to normalization of fecal calprotectin.  She was doing well until recently when her symptoms flared up.  We discussed that this could be related to concomitant infection for which we will need to perform stool testing.  Will also check CBC and CMP today.  The patient will need to continue with her current regimen of 6-MP and Lialda.  However, if stool testing is negative for any infection, will need to proceed with colonoscopy to assess her disease activity.  Perform blood workup  Perform stool workup Continue with 6-MP 75 mg qday Continue with Lialda 2.4 g qday If normal stool testing, will need to proceed with colonoscopy  All questions were answered.      [name], MD Gastroenterology and Hepatology [facility]

## 2022-08-10 NOTE — Patient Instructions (Signed)
Perform blood workup  Perform stool workup Continue with 6-MP 75 mg qday Continue with Lialda 2.4 g qday If normal stool testing, will need to proceed with colonoscopy

## 2022-08-11 LAB — COMPREHENSIVE METABOLIC PANEL
AG Ratio: 1.7 (calc) (ref 1.0–2.5)
ALT: 17 U/L (ref 6–29)
AST: 14 U/L (ref 10–35)
Albumin: 4 g/dL (ref 3.6–5.1)
Alkaline phosphatase (APISO): 57 U/L (ref 37–153)
BUN: 13 mg/dL (ref 7–25)
CO2: 28 mmol/L (ref 20–32)
Calcium: 9.1 mg/dL (ref 8.6–10.4)
Chloride: 103 mmol/L (ref 98–110)
Creat: 0.55 mg/dL (ref 0.50–1.03)
Globulin: 2.3 g/dL (calc) (ref 1.9–3.7)
Glucose, Bld: 123 mg/dL — ABNORMAL HIGH (ref 65–99)
Potassium: 3.6 mmol/L (ref 3.5–5.3)
Sodium: 141 mmol/L (ref 135–146)
Total Bilirubin: 0.6 mg/dL (ref 0.2–1.2)
Total Protein: 6.3 g/dL (ref 6.1–8.1)

## 2022-08-11 LAB — CBC WITH DIFFERENTIAL/PLATELET
Absolute Monocytes: 554 cells/uL (ref 200–950)
Basophils Absolute: 44 cells/uL (ref 0–200)
Basophils Relative: 0.5 %
Eosinophils Absolute: 97 cells/uL (ref 15–500)
Eosinophils Relative: 1.1 %
HCT: 44.2 % (ref 35.0–45.0)
Hemoglobin: 15.3 g/dL (ref 11.7–15.5)
Lymphs Abs: 1962 cells/uL (ref 850–3900)
MCH: 33.8 pg — ABNORMAL HIGH (ref 27.0–33.0)
MCHC: 34.6 g/dL (ref 32.0–36.0)
MCV: 97.8 fL (ref 80.0–100.0)
MPV: 10 fL (ref 7.5–12.5)
Monocytes Relative: 6.3 %
Neutro Abs: 6142 cells/uL (ref 1500–7800)
Neutrophils Relative %: 69.8 %
Platelets: 237 10*3/uL (ref 140–400)
RBC: 4.52 10*6/uL (ref 3.80–5.10)
RDW: 11.9 % (ref 11.0–15.0)
Total Lymphocyte: 22.3 %
WBC: 8.8 10*3/uL (ref 3.8–10.8)

## 2022-08-14 LAB — C. DIFFICILE GDH AND TOXIN A/B
GDH ANTIGEN: NOT DETECTED
MICRO NUMBER:: 14304996
SPECIMEN QUALITY:: ADEQUATE
TOXIN A AND B: NOT DETECTED

## 2022-08-14 LAB — GASTROINTESTINAL PATHOGEN PNL

## 2022-08-17 ENCOUNTER — Other Ambulatory Visit (INDEPENDENT_AMBULATORY_CARE_PROVIDER_SITE_OTHER): Payer: Self-pay | Admitting: *Deleted

## 2022-08-17 DIAGNOSIS — R197 Diarrhea, unspecified: Secondary | ICD-10-CM

## 2022-08-17 DIAGNOSIS — R109 Unspecified abdominal pain: Secondary | ICD-10-CM

## 2022-08-17 DIAGNOSIS — K50119 Crohn's disease of large intestine with unspecified complications: Secondary | ICD-10-CM

## 2022-09-02 ENCOUNTER — Other Ambulatory Visit (INDEPENDENT_AMBULATORY_CARE_PROVIDER_SITE_OTHER): Payer: Self-pay | Admitting: Gastroenterology

## 2022-09-02 DIAGNOSIS — K50119 Crohn's disease of large intestine with unspecified complications: Secondary | ICD-10-CM

## 2022-09-02 DIAGNOSIS — K5010 Crohn's disease of large intestine without complications: Secondary | ICD-10-CM

## 2023-01-04 ENCOUNTER — Other Ambulatory Visit (INDEPENDENT_AMBULATORY_CARE_PROVIDER_SITE_OTHER): Payer: Self-pay | Admitting: *Deleted

## 2023-01-04 DIAGNOSIS — K50119 Crohn's disease of large intestine with unspecified complications: Secondary | ICD-10-CM

## 2023-01-04 MED ORDER — MESALAMINE 1.2 G PO TBEC: DELAYED_RELEASE_TABLET | ORAL | 1 refills | Status: DC

## 2023-02-15 ENCOUNTER — Ambulatory Visit (INDEPENDENT_AMBULATORY_CARE_PROVIDER_SITE_OTHER): Payer: BC Managed Care – PPO | Admitting: Gastroenterology

## 2023-03-01 ENCOUNTER — Ambulatory Visit (INDEPENDENT_AMBULATORY_CARE_PROVIDER_SITE_OTHER): Payer: BC Managed Care – PPO | Admitting: Gastroenterology

## 2023-03-01 ENCOUNTER — Other Ambulatory Visit (INDEPENDENT_AMBULATORY_CARE_PROVIDER_SITE_OTHER): Payer: Self-pay | Admitting: *Deleted

## 2023-03-01 ENCOUNTER — Encounter (INDEPENDENT_AMBULATORY_CARE_PROVIDER_SITE_OTHER): Payer: Self-pay | Admitting: Gastroenterology

## 2023-03-01 VITALS — BP 109/72 | HR 88 | Temp 99.0°F | Ht 63.0 in | Wt 252.4 lb

## 2023-03-01 DIAGNOSIS — K5010 Crohn's disease of large intestine without complications: Secondary | ICD-10-CM | POA: Diagnosis not present

## 2023-03-01 DIAGNOSIS — K219 Gastro-esophageal reflux disease without esophagitis: Secondary | ICD-10-CM

## 2023-03-01 DIAGNOSIS — R1012 Left upper quadrant pain: Secondary | ICD-10-CM | POA: Diagnosis not present

## 2023-03-01 MED ORDER — FAMOTIDINE 20 MG PO TABS: 20.0000 mg | ORAL_TABLET | Freq: Every day | ORAL | 3 refills | Status: DC

## 2023-03-01 NOTE — Progress Notes (Unsigned)
Referring Provider: Richardean Chimera, MD Primary Care Physician:  Richardean Chimera, MD Primary GI Physician: Dr. Levon Hedger   Chief Complaint  Patient presents with   Crohn's Disease    Follow up on Crohn's disease. Has some pain on left upper side off and on. Started having nausea about one month ago. Stools has changed about one month ago and goes from diarrhea to constipation. She is being tested for inflammation by rheumatologist Dr. Deanne Coffer at [facility]. Having pain in joints for about one year.    Gastroesophageal Reflux    Follow up on GERD. Reports reflux is better. Taking protonix 20mg  one bid.    HPI:   [name] is a 57 y.o. female with past medical history of Crohn's colitis, asthma, anxiety, pulmonary embolism and DVT due to COVID, sleep apnea, obesity, IBS, hyperlipidemia, hypertension   Patient presenting today for follow up of Crohn's disease and GERD  Last seen December 2023, at that time Patient was diagnosed with Crohn's colitis in 1999.  Since then she had been on Pentasa and 6-MP for multiple years.She had Remicade in the past but had joint issues. reported that for the last 3 weeks she has presented worsening diarrhea and abdominal pain in the upper abdomen and right side of the abdomen. She is having 4-5 Bms per day, which have a watery to very loose consistency.  She has had some intermittent nausea.  Recommended stool and lab testing, continue 6-MP, lialda, consider colonoscopy pending workup results.  C diff negative, GI pathogen panel not completed, last Calprotecin Normal in June 2023.   Present:  Recent labs: negative Hep B serologies, Negative TB testing,CRP <1, Sed rate 13,   She note she has changed her diet recently, doing more Keto friendly diet. She may go 2-3 days without a BM, other days she may have looser stools. Denies rectal bleeding. She is having formed stools, sometimes having to strain more when she goes. Notes  approximately 75% of her stools are formed.   She has some LUQ pain that is intermittent, she endorses some nausea with it. She feels that this pain started about 3 months ago. Certain foods tend to make her LUQ pain worse, such as fatty foods. Low carb does not seem to bother her as much.   She is seeing rheumatology, concerns for RA or inflammatory arthritis. They are considering starting her on Humira. She is seeing them again in 7/29 to discuss recent labs results and plan further.   Last flu shot: 2022  Last pneumonia shot: yes, <5 years ago Last Pap smear: 03/2022 Last evaluation by dermatology: June 2024  Last zoster vaccine: yes, had it in the past Last DEXA scan: had it in March 2023 , normal per patient COVID-19 shot: Moderna   She is taking protonix 20mg  BID, she feels symptoms are some better though she notes more silent reflux symptoms with cough and globus sensation which have improved some, though still having some of these symptoms in the evenings. Denies heartburn or acid regurgitation.   Last Colonoscopy: 05/14/2021 - performed by Dr. Karilyn Cota - Three diminutive polyps in the transverse colon and at the hepatic flexure. Biopsied. - One 1 mm polyp in the proximal transverse colon, removed with a cold snare. Resected and retrieved. No TI intubation performed. No active inflammation in the colon  Path: A. COLON, TRANSVERSE, HEPATIC FLEXURE, POLYPECTOMY:  - Polypoid colonic mucosa with a prominent lymphoid aggregate  - Negative for dysplasia  -  Multiple step sections were examined     Past Medical History:  Diagnosis Date   Anxiety    No medications currently   Arthritis    Asthma    Atypical mole 06/21/2015   mod/ sev left upper back tx w/s   Atypical mole 06/21/2015   mod/sev left post shoulder med tx w/s   Atypical mole 06/21/2015   mod/ sev left post shjouldeer lateral  tx w/s   Atypical mole 08/01/2015   mild right upper back no tx   Atypical mole 09/16/2021    left upper back (moderate to severe)   Bilateral carpal tunnel syndrome    COVID 04/08/2020   VDRF ARDS   04/16/20-extubated 05/04/2020   Crohn disease (HCC)    Diabetes mellitus    diet controlled   DVT (deep venous thrombosis) (HCC) 05/30/2020   Heart murmur    mild   High cholesterol    Hypertension    pcp  Dr Aurther Loft Reuel Boom    [place]   IBS (irritable bowel syndrome)    Obesity    Pneumonia 2011   History of   Pulmonary embolism (HCC) 05/30/2020   DVT/PE    Sleep apnea 03-2009 study   pt forgot to mention at pre admit visit.  uses c-pap brought her own  machine from home.    Trigger thumb, right thumb     Past Surgical History:  Procedure Laterality Date   CARDIAC CATHETERIZATION  05/30/2020   Removal pulmonary embolus    [facility]   CARPAL TUNNEL RELEASE Right 09/20/2019   Procedure: CARPAL TUNNEL RELEASE;  Surgeon: Bjorn Pippin, MD;  Location: [facility];  Service: Orthopedics;  Laterality: Right;   CARPAL TUNNEL RELEASE Left 11/15/2019   Procedure: CARPAL TUNNEL RELEASE, EXCISION OF GANGLION CYST;  Surgeon: Bjorn Pippin, MD;  Location: [facility];  Service: Orthopedics;  Laterality: Left;   CHOLECYSTECTOMY N/A 03/31/2013   Procedure: LAPAROSCOPIC CHOLECYSTECTOMY;  Surgeon: Dalia Heading, MD;  Location: [facility];  Service: General;  Laterality: N/A;   COLONOSCOPY N/A 03/16/2013   Procedure: COLONOSCOPY;  Surgeon: Malissa Hippo, MD;  [facility]: [facility];  Service: Endoscopy;  Laterality: N/A;  255   COLONOSCOPY N/A 12/31/2014   Procedure: COLONOSCOPY;  Surgeon: Malissa Hippo, MD;  [facility]: [facility];  Service: Endoscopy;  Laterality: N/A;  730   COLONOSCOPY WITH PROPOFOL N/A 05/14/2021   Procedure: COLONOSCOPY WITH PROPOFOL;  Surgeon: Malissa Hippo, MD;  [facility]: [facility];  Service: Endoscopy;  Laterality: N/A;  915   CYST REMOVAL HAND     cyst removed from left hand     FLEXIBLE SIGMOIDOSCOPY N/A 08/21/2014   Procedure: FLEXIBLE SIGMOIDOSCOPY;  Surgeon: Malissa Hippo, MD;  [facility];  Service: Endoscopy;  Laterality: N/A;  1030   POLYPECTOMY  05/14/2021   Procedure: POLYPECTOMY;  Surgeon: Malissa Hippo, MD;  [facility]: [facility];  Service: Endoscopy;;   TONSILLECTOMY     TOTAL HIP ARTHROPLASTY  04/18/2012   Procedure: TOTAL HIP ARTHROPLASTY;  Surgeon: Eldred Manges, MD;  Location: [facility];  Service: Orthopedics;  Laterality: Right;  Right Total Hip Arthroplasty   TOTAL SHOULDER ARTHROPLASTY Left 07/09/2021   Procedure: TOTAL SHOULDER ARTHROPLASTY;  Surgeon: Bjorn Pippin, MD;  Location: [facility];  Service: Orthopedics;  Laterality: Left;   TRIGGER FINGER RELEASE Left 07/09/2021   Procedure: RELEASE TRIGGER FINGER/A-1 PULLEY;  Surgeon: Bjorn Pippin, MD;  Location: [facility];  Service: Orthopedics;  Laterality: Left;    Current Outpatient Medications  Medication Sig Dispense Refill   albuterol (PROVENTIL) (2.5 MG/3ML) 0.083% nebulizer solution Inhale 2.5 mg into the lungs daily as needed for shortness of breath or wheezing.     albuterol (VENTOLIN HFA) 108 (90 Base) MCG/ACT inhaler Inhale 2 puffs into the lungs every 4 (four) hours as needed for wheezing or shortness of breath.     busPIRone (BUSPAR) 15 MG tablet Take 15 mg by mouth. One qam and one half at night     Calcium Carb-Cholecalciferol (CALCIUM 600/VITAMIN D3 PO) Take 1,200 mg by mouth daily.     dicyclomine (BENTYL) 20 MG tablet TAKE ONE TABLET BY MOUTH THREE TIMES DAILY AS NEEDED FOR SPASMS. 90 tablet 5   DULoxetine (CYMBALTA) 60 MG capsule Take 1 capsule (60 mg total) by mouth daily. (Patient taking differently: Take 120 mg by mouth daily.) 5 capsule 0   fluticasone (FLONASE) 50 MCG/ACT nasal spray Place 1 spray into both nostrils daily as needed for allergies or rhinitis.     Fluticasone-Salmeterol (ADVAIR) 250-50 MCG/DOSE AEPB Inhale 1 puff into the lungs daily.     JARDIANCE 25 MG TABS tablet Take 25 mg by mouth daily.      levocetirizine (XYZAL) 5 MG tablet Take 5 mg by  mouth every evening.     MAGNESIUM OXIDE PO Take 1,000 mg by mouth at bedtime.     mercaptopurine (PURINETHOL) 50 MG tablet Take 1.5 tablets (75 mg total) by mouth daily. Give on an empty stomach 1 hour before or 2 hours after meals. Caution: Chemotherapy. 45 tablet 5   mesalamine (LIALDA) 1.2 g EC tablet TAKE TWO (2) TABLETS BY MOUTH DAILY WITH BREAKFAST 180 tablet 1   Multiple Vitamin (MULITIVITAMIN WITH MINERALS) TABS Take 1 tablet by mouth every morning.      pantoprazole (PROTONIX) 20 MG tablet Take 20 mg by mouth 2 (two) times daily.     Probiotic Product (PROBIOTIC DAILY PO) Take 200 mg by mouth daily.     rosuvastatin (CRESTOR) 20 MG tablet Take 20 mg by mouth at bedtime.     Semaglutide (RYBELSUS PO) Take 7 mg by mouth daily at 6 (six) AM.     traZODone (DESYREL) 50 MG tablet Take 1 tablet (50 mg total) by mouth at bedtime as needed for sleep. (Patient taking differently: Take 100 mg by mouth at bedtime as needed for sleep.) 5 tablet 0   No current facility-administered medications for this visit.    Allergies as of 03/01/2023 - Review Complete 03/01/2023  Allergen Reaction Noted   Remicade [infliximab] Swelling 10/08/2011   Cefprozil Rash 10/08/2011   Penicillins Rash 10/08/2011    No family history on file.  Social History   Socioeconomic History   Marital status: Married    Spouse name: Not on file   Number of children: Not on file   Years of education: Not on file   Highest education level: Not on file  Occupational History   Not on file  Tobacco Use   Smoking status: Never    Passive exposure: Past   Smokeless tobacco: Never  Vaping Use   Vaping Use: Never used  Substance and Sexual Activity   Alcohol use: No    Alcohol/week: 0.0 standard drinks of alcohol   Drug use: No   Sexual activity: Yes    Birth control/protection: Post-menopausal  Other Topics Concern   Not on file  Social History Narrative   Not  on file   Social Determinants of Health    Financial Resource Strain: Not on file  Food Insecurity: Not on file  Transportation Needs: Not on file  Physical Activity: Not on file  Stress: Not on file  Social Connections: Not on file   Review of systems General: negative for malaise, night sweats, fever, chills, weight loss Neck: Negative for lumps, goiter, pain and significant neck swelling Resp: Negative for cough, wheezing, dyspnea at rest CV: Negative for chest pain, leg swelling, palpitations, orthopnea GI: denies melena, hematochezia, nausea, vomiting, diarrhea, dysphagia, odyonophagia, early satiety or unintentional weight loss. +constipation +LUQ Pain  MSK: Negative for joint pain or swelling, back pain, and muscle pain. Derm: Negative for itching or rash Psych: Denies depression, anxiety, memory loss, confusion. No homicidal or suicidal ideation.  Heme: Negative for prolonged bleeding, bruising easily, and swollen nodes. Endocrine: Negative for cold or heat intolerance, polyuria, polydipsia and goiter. Neuro: negative for tremor, gait imbalance, syncope and seizures. The remainder of the review of systems is noncontributory.  Physical Exam: BP 109/72 (BP Location: Left Arm, Patient Position: Sitting, Cuff Size: Large)   Pulse 88   Temp 99 F (37.2 C) (Oral)   Ht 5\' 3"  (1.6 m)   Wt 252 lb 6.4 oz (114.5 kg)   BMI 44.71 kg/m  General:   Alert and oriented. No distress noted. Pleasant and cooperative.  Head:  Normocephalic and atraumatic. Eyes:  Conjuctiva clear without scleral icterus. Mouth:  Oral mucosa pink and moist. Good dentition. No lesions. Heart: Normal rate and rhythm, s1 and s2 heart sounds present.  Lungs: Clear lung sounds in all lobes. Respirations equal and unlabored. Abdomen:  +BS, soft, non-tender and non-distended. No rebound or guarding. No HSM or masses noted. Derm: No palmar erythema or jaundice Msk:  Symmetrical without gross deformities. Normal posture. Extremities:  Without  edema. Neurologic:  Alert and  oriented x4 Psych:  Alert and cooperative. Normal mood and affect.  Invalid input(s): "6 MONTHS"   ASSESSMENT: [name] is a 57 y.o. female presenting today   Crohn's Colitis: diagnosed in 1999. Has been on Lialda and 6-MP for the last few years with last Colonoscopy in 2022 with absence of inflammation. Most Recent CRP was <1. She reports recent basic labs with her PCP which she thought were normal, we will obtain these. She endorses some loose stools but lately with some harder stools, can sometimes go 2-3 days without a BM, she notes changes in bowel habits when she began Keto diet. No rectal bleeding or melena. No lower abdominal pain. Will update fecal calprotectin. She is in the process of potentially starting biologic therapy for possible RA (humira). She has follow up with Rheumatology at the end of the month, we discussed that if they start her on Humira this could be used to also control her Crohn's disease and we can discuss stopping 6-MP and Mesalamine. She will keep me updated on any biologic therapy started for her RA.   LUQ pain/GERD: some LUQ pain with occasional nausea, greasy foods tend to make her pain worse. She notes that she is taking protonix 20mg  BID with some improvement though still having some coughing/globus sensation in the evenings. She does note she is taking 1000mg  ibuprofen 2-3x/week. No rectal bleeding or melena. No weight loss. I did counsel the patient on adverse effects of frequent NSAID use on the GI tract, especially in presence of her Crohn's disease. Will continue with PPI BID and add pepcid 20mg   at bedtime, as she has no alarm symptoms, will hold off on endoscopic evaluation, however, if LUQ pain is not improving, should consider EGD to further evaluate her symptoms.    PLAN:  Fecal Calprotectin  2. Obtain labs from PCP  3. Continue with lialda 2.4g daily/6-MP 75mg  daily 4. Continue protonix 20mg  BID 5. Start pepcid  20mg   6. EGD if LUQ pain persists 7. Avoid NSAID use 8. Patient to update on plans to start biologic therapy for RA  All questions were answered, patient verbalized understanding and is in agreement with plan as outlined above.   Follow Up: 6 months   Ettie Krontz L. Jeanmarie Hubert, MSN, APRN, AGNP-C Adult-Gerontology Nurse Practitioner [facility]   I have reviewed the note and agree with the APP's assessment as described in this progress note  If started on Humira, may stop 6-MP and eventually discuss stopping Lialda.  Katrinka Blazing, MD Gastroenterology and Hepatology [facility]

## 2023-03-01 NOTE — Patient Instructions (Addendum)
Continue with protonix 40mg  twice daily Will add famotidine 20mg  at bedtime to see if this controls GERD symptoms better As discussed, high/frequent doses of ibuprofen can cause inflammation/bleeding/ulcers in the stomach and are also not good for your Crohn's disease, please try to decrease your use of these If your left upper abdominal pain does not improve, please let me know  We will check a fecal calprotectin to monitor inflammation in the colon, I will obtain labs from your PCP   Please update me on therapy your rheumatologist decides to start, as discussed, if they start Humira this may be used to manage your Crohn's as well and you could potentially come off of the mesalamine and 6-MP, we will continue these for now.  Follow up 6 months  It was a pleasure to see you today. I want to create trusting relationships with patients and provide genuine, compassionate, and quality care. I truly value your feedback! please be on the lookout for a survey regarding your visit with me today. I appreciate your input about our visit and your time in completing this!    Earon Rivest L. Jeanmarie Hubert, MSN, APRN, AGNP-C Adult-Gerontology Nurse Practitioner [facility] at [facility]

## 2023-03-02 DIAGNOSIS — K219 Gastro-esophageal reflux disease without esophagitis: Secondary | ICD-10-CM | POA: Insufficient documentation

## 2023-03-02 DIAGNOSIS — R1012 Left upper quadrant pain: Secondary | ICD-10-CM | POA: Insufficient documentation

## 2023-03-07 LAB — CALPROTECTIN, FECAL: Calprotectin, Fecal: 50 ug/g (ref 0–120)

## 2023-03-08 ENCOUNTER — Encounter (INDEPENDENT_AMBULATORY_CARE_PROVIDER_SITE_OTHER): Payer: Self-pay

## 2023-03-24 ENCOUNTER — Telehealth (INDEPENDENT_AMBULATORY_CARE_PROVIDER_SITE_OTHER): Payer: Self-pay | Admitting: *Deleted

## 2023-03-24 NOTE — Telephone Encounter (Signed)
Pt called wanting dates she started some of her meds. mesalamine 1.2 ( started 03/09/22 ), dicyclomine (08/21/2014 ) and 6mp was 02/24/13. Pt states she needs date for disablitly forms and I gave her the dates above. She states she does not print out of dates.

## 2023-03-29 ENCOUNTER — Other Ambulatory Visit (INDEPENDENT_AMBULATORY_CARE_PROVIDER_SITE_OTHER): Payer: Self-pay | Admitting: *Deleted

## 2023-03-29 ENCOUNTER — Other Ambulatory Visit (INDEPENDENT_AMBULATORY_CARE_PROVIDER_SITE_OTHER): Payer: Self-pay | Admitting: Gastroenterology

## 2023-03-29 DIAGNOSIS — K50119 Crohn's disease of large intestine with unspecified complications: Secondary | ICD-10-CM

## 2023-03-29 DIAGNOSIS — K5010 Crohn's disease of large intestine without complications: Secondary | ICD-10-CM

## 2023-03-29 NOTE — Telephone Encounter (Signed)
Left message to return call 

## 2023-03-29 NOTE — Telephone Encounter (Signed)
Pt notified to do labs and she requested [facility]. Orders put in.

## 2023-03-29 NOTE — Telephone Encounter (Signed)
Seen by chelsea on 03/01/23 and Dr. Salena Saner 08/10/22 for Crohn's. Chelsea's last note states to continue 6 -mp.

## 2023-03-29 NOTE — Telephone Encounter (Signed)
Please send her an order for CBC and CMP.  I will refill the medication.

## 2023-03-30 ENCOUNTER — Telehealth (INDEPENDENT_AMBULATORY_CARE_PROVIDER_SITE_OTHER): Payer: Self-pay | Admitting: *Deleted

## 2023-03-30 NOTE — Telephone Encounter (Signed)
Pt saw RA doctor yesterday and states he agreed to take her off of the infusion and put her on cimizia she states that is one of the meds Dr. Levon Hedger wanted her to go on so she could come off of .   [phone]

## 2023-03-30 NOTE — Telephone Encounter (Signed)
Sounds good. She should take the last dose of 6-MP the day that before she takes her first Cimzia shot.  Thanks

## 2023-03-30 NOTE — Telephone Encounter (Signed)
Called pt to get more information and she states RA dr will be giving cimizia injections in office to start. She will be doing injection eery 2 weeks for 2 months and then monthly and she will start to give to herself at home. They are working on getting med approved and she does not know her start date yet. Wants to know when she will stop ?

## 2023-03-31 NOTE — Telephone Encounter (Signed)
Patient notified

## 2023-04-14 ENCOUNTER — Encounter (INDEPENDENT_AMBULATORY_CARE_PROVIDER_SITE_OTHER): Payer: Self-pay

## 2023-05-31 ENCOUNTER — Other Ambulatory Visit: Payer: Self-pay | Admitting: *Deleted

## 2023-05-31 DIAGNOSIS — K429 Umbilical hernia without obstruction or gangrene: Secondary | ICD-10-CM

## 2023-06-03 ENCOUNTER — Ambulatory Visit: Payer: BC Managed Care – PPO | Admitting: General Surgery

## 2023-06-17 ENCOUNTER — Encounter: Payer: Self-pay | Admitting: General Surgery

## 2023-06-17 ENCOUNTER — Ambulatory Visit: Payer: BC Managed Care – PPO | Admitting: General Surgery

## 2023-06-17 VITALS — BP 121/75 | HR 93 | Temp 98.3°F | Resp 16 | Ht 63.0 in | Wt 261.0 lb

## 2023-06-17 DIAGNOSIS — K432 Incisional hernia without obstruction or gangrene: Secondary | ICD-10-CM

## 2023-06-17 NOTE — Progress Notes (Signed)
[name]; [id number]; 05/24/1966   HPI Patient is a 57 year old white female who was referred to my care by Dr. Reuel Boom for evaluation and treatment of a periumbilical hernia.  Patient states that she first noticed pain around her umbilicus approximately 6 months ago.  She did notice some swelling above the umbilicus and it is made worse with straining.  No nausea or vomiting have been noted.  She has not had an episode of incarceration.  She did have an episode of a spot of blood coming from the umbilicus.  This has since resolved.  She has never had cellulitis of that area.  She is status post a laparoscopic cholecystectomy in 2014. Past Medical History:  Diagnosis Date   Anxiety    No medications currently   Arthritis    Asthma    Atypical mole 06/21/2015   mod/ sev left upper back tx w/s   Atypical mole 06/21/2015   mod/sev left post shoulder med tx w/s   Atypical mole 06/21/2015   mod/ sev left post shjouldeer lateral  tx w/s   Atypical mole 08/01/2015   mild right upper back no tx   Atypical mole 09/16/2021   left upper back (moderate to severe)   Bilateral carpal tunnel syndrome    COVID 04/08/2020   VDRF ARDS   04/16/20-extubated 05/04/2020   Crohn disease (HCC)    Diabetes mellitus    diet controlled   DVT (deep venous thrombosis) (HCC) 05/30/2020   Heart murmur    mild   High cholesterol    Hypertension    pcp  Dr Aurther Loft Reuel Boom    [place]   IBS (irritable bowel syndrome)    Obesity    Pneumonia 2011   History of   Pulmonary embolism (HCC) 05/30/2020   DVT/PE    Sleep apnea 03-2009 study   pt forgot to mention at pre admit visit.  uses c-pap brought her own  machine from home.    Trigger thumb, right thumb     Past Surgical History:  Procedure Laterality Date   CARDIAC CATHETERIZATION  05/30/2020   Removal pulmonary embolus    [facility]   CARPAL TUNNEL RELEASE Right 09/20/2019   Procedure: CARPAL TUNNEL RELEASE;  Surgeon: Bjorn Pippin, MD;  [facility]: [facility];   Service: Orthopedics;  Laterality: Right;   CARPAL TUNNEL RELEASE Left 11/15/2019   Procedure: CARPAL TUNNEL RELEASE, EXCISION OF GANGLION CYST;  Surgeon: Bjorn Pippin, MD;  [facility];  Service: Orthopedics;  Laterality: Left;   CHOLECYSTECTOMY N/A 03/31/2013   Procedure: LAPAROSCOPIC CHOLECYSTECTOMY;  Surgeon: Dalia Heading, MD;  [facility]: [facility];  Service: General;  Laterality: N/A;   COLONOSCOPY N/A 03/16/2013   Procedure: COLONOSCOPY;  Surgeon: Malissa Hippo, MD;  [facility];  Service: Endoscopy;  Laterality: N/A;  255   COLONOSCOPY N/A 12/31/2014   Procedure: COLONOSCOPY;  Surgeon: Malissa Hippo, MD;  [facility];  Service: Endoscopy;  Laterality: N/A;  730   COLONOSCOPY WITH PROPOFOL N/A 05/14/2021   Procedure: COLONOSCOPY WITH PROPOFOL;  Surgeon: Malissa Hippo, MD;  [facility];  Service: Endoscopy;  Laterality: N/A;  915   CYST REMOVAL HAND     cyst removed from left hand     FLEXIBLE SIGMOIDOSCOPY N/A 08/21/2014   Procedure: FLEXIBLE SIGMOIDOSCOPY;  Surgeon: Malissa Hippo, MD;  [facility];  Service: Endoscopy;  Laterality: N/A;  1030   POLYPECTOMY  05/14/2021   Procedure:  [name]; [id number]; 05/24/1966   HPI Patient is a 57 year old white female who was referred to my care by Dr. Reuel Boom for evaluation and treatment of a periumbilical hernia.  Patient states that she first noticed pain around her umbilicus approximately 6 months ago.  She did notice some swelling above the umbilicus and it is made worse with straining.  No nausea or vomiting have been noted.  She has not had an episode of incarceration.  She did have an episode of a spot of blood coming from the umbilicus.  This has since resolved.  She has never had cellulitis of that area.  She is status post a laparoscopic cholecystectomy in 2014. Past Medical History:  Diagnosis Date   Anxiety    No medications currently   Arthritis    Asthma    Atypical mole 06/21/2015   mod/ sev left upper back tx w/s   Atypical mole 06/21/2015   mod/sev left post shoulder med tx w/s   Atypical mole 06/21/2015   mod/ sev left post shjouldeer lateral  tx w/s   Atypical mole 08/01/2015   mild right upper back no tx   Atypical mole 09/16/2021   left upper back (moderate to severe)   Bilateral carpal tunnel syndrome    COVID 04/08/2020   VDRF ARDS   04/16/20-extubated 05/04/2020   Crohn disease (HCC)    Diabetes mellitus    diet controlled   DVT (deep venous thrombosis) (HCC) 05/30/2020   Heart murmur    mild   High cholesterol    Hypertension    pcp  Dr Aurther Loft Reuel Boom    [place]   IBS (irritable bowel syndrome)    Obesity    Pneumonia 2011   History of   Pulmonary embolism (HCC) 05/30/2020   DVT/PE    Sleep apnea 03-2009 study   pt forgot to mention at pre admit visit.  uses c-pap brought her own  machine from home.    Trigger thumb, right thumb     Past Surgical History:  Procedure Laterality Date   CARDIAC CATHETERIZATION  05/30/2020   Removal pulmonary embolus    [facility]   CARPAL TUNNEL RELEASE Right 09/20/2019   Procedure: CARPAL TUNNEL RELEASE;  Surgeon: Bjorn Pippin, MD;  [facility];   Service: Orthopedics;  Laterality: Right;   CARPAL TUNNEL RELEASE Left 11/15/2019   Procedure: CARPAL TUNNEL RELEASE, EXCISION OF GANGLION CYST;  Surgeon: Bjorn Pippin, MD;  [facility];  Service: Orthopedics;  Laterality: Left;   CHOLECYSTECTOMY N/A 03/31/2013   Procedure: LAPAROSCOPIC CHOLECYSTECTOMY;  Surgeon: Dalia Heading, MD;  [facility];  Service: General;  Laterality: N/A;   COLONOSCOPY N/A 03/16/2013   Procedure: COLONOSCOPY;  Surgeon: Malissa Hippo, MD;  [facility];  Service: Endoscopy;  Laterality: N/A;  255   COLONOSCOPY N/A 12/31/2014   Procedure: COLONOSCOPY;  Surgeon: Malissa Hippo, MD;  [facility];  Service: Endoscopy;  Laterality: N/A;  730   COLONOSCOPY WITH PROPOFOL N/A 05/14/2021   Procedure: COLONOSCOPY WITH PROPOFOL;  Surgeon: Malissa Hippo, MD;  [facility];  Service: Endoscopy;  Laterality: N/A;  915   CYST REMOVAL HAND     cyst removed from left hand     FLEXIBLE SIGMOIDOSCOPY N/A 08/21/2014   Procedure: FLEXIBLE SIGMOIDOSCOPY;  Surgeon: Malissa Hippo, MD;  [facility];  Service: Endoscopy;  Laterality: N/A;  1030   POLYPECTOMY  05/14/2021   Procedure:  [name]; [id number]; 05/24/1966   HPI Patient is a 57 year old white female who was referred to my care by Dr. Reuel Boom for evaluation and treatment of a periumbilical hernia.  Patient states that she first noticed pain around her umbilicus approximately 6 months ago.  She did notice some swelling above the umbilicus and it is made worse with straining.  No nausea or vomiting have been noted.  She has not had an episode of incarceration.  She did have an episode of a spot of blood coming from the umbilicus.  This has since resolved.  She has never had cellulitis of that area.  She is status post a laparoscopic cholecystectomy in 2014. Past Medical History:  Diagnosis Date   Anxiety    No medications currently   Arthritis    Asthma    Atypical mole 06/21/2015   mod/ sev left upper back tx w/s   Atypical mole 06/21/2015   mod/sev left post shoulder med tx w/s   Atypical mole 06/21/2015   mod/ sev left post shjouldeer lateral  tx w/s   Atypical mole 08/01/2015   mild right upper back no tx   Atypical mole 09/16/2021   left upper back (moderate to severe)   Bilateral carpal tunnel syndrome    COVID 04/08/2020   VDRF ARDS   04/16/20-extubated 05/04/2020   Crohn disease (HCC)    Diabetes mellitus    diet controlled   DVT (deep venous thrombosis) (HCC) 05/30/2020   Heart murmur    mild   High cholesterol    Hypertension    pcp  Dr Aurther Loft Reuel Boom    [place]   IBS (irritable bowel syndrome)    Obesity    Pneumonia 2011   History of   Pulmonary embolism (HCC) 05/30/2020   DVT/PE    Sleep apnea 03-2009 study   pt forgot to mention at pre admit visit.  uses c-pap brought her own  machine from home.    Trigger thumb, right thumb     Past Surgical History:  Procedure Laterality Date   CARDIAC CATHETERIZATION  05/30/2020   Removal pulmonary embolus    [facility]   CARPAL TUNNEL RELEASE Right 09/20/2019   Procedure: CARPAL TUNNEL RELEASE;  Surgeon: Bjorn Pippin, MD;  [facility];   Service: Orthopedics;  Laterality: Right;   CARPAL TUNNEL RELEASE Left 11/15/2019   Procedure: CARPAL TUNNEL RELEASE, EXCISION OF GANGLION CYST;  Surgeon: Bjorn Pippin, MD;  [facility];  Service: Orthopedics;  Laterality: Left;   CHOLECYSTECTOMY N/A 03/31/2013   Procedure: LAPAROSCOPIC CHOLECYSTECTOMY;  Surgeon: Dalia Heading, MD;  [facility];  Service: General;  Laterality: N/A;   COLONOSCOPY N/A 03/16/2013   Procedure: COLONOSCOPY;  Surgeon: Malissa Hippo, MD;  [facility];  Service: Endoscopy;  Laterality: N/A;  255   COLONOSCOPY N/A 12/31/2014   Procedure: COLONOSCOPY;  Surgeon: Malissa Hippo, MD;  [facility];  Service: Endoscopy;  Laterality: N/A;  730   COLONOSCOPY WITH PROPOFOL N/A 05/14/2021   Procedure: COLONOSCOPY WITH PROPOFOL;  Surgeon: Malissa Hippo, MD;  [facility];  Service: Endoscopy;  Laterality: N/A;  915   CYST REMOVAL HAND     cyst removed from left hand     FLEXIBLE SIGMOIDOSCOPY N/A 08/21/2014   Procedure: FLEXIBLE SIGMOIDOSCOPY;  Surgeon: Malissa Hippo, MD;  [facility];  Service: Endoscopy;  Laterality: N/A;  1030   POLYPECTOMY  05/14/2021   Procedure:

## 2023-06-17 NOTE — Addendum Note (Signed)
Addended by: Phillips Odor on: 06/17/2023 10:01 AM   Modules accepted: Orders

## 2023-06-19 ENCOUNTER — Encounter (INDEPENDENT_AMBULATORY_CARE_PROVIDER_SITE_OTHER): Payer: Self-pay | Admitting: Gastroenterology

## 2023-07-05 NOTE — H&P (Signed)
[name]; [id number]; 05/24/1966   HPI Patient is a 57 year old white female who was referred to my care by Dr. Reuel Boom for evaluation and treatment of a periumbilical hernia.  Patient states that she first noticed pain around her umbilicus approximately 6 months ago.  She did notice some swelling above the umbilicus and it is made worse with straining.  No nausea or vomiting have been noted.  She has not had an episode of incarceration.  She did have an episode of a spot of blood coming from the umbilicus.  This has since resolved.  She has never had cellulitis of that area.  She is status post a laparoscopic cholecystectomy in 2014. Past Medical History:  Diagnosis Date   Anxiety    No medications currently   Arthritis    Asthma    Atypical mole 06/21/2015   mod/ sev left upper back tx w/s   Atypical mole 06/21/2015   mod/sev left post shoulder med tx w/s   Atypical mole 06/21/2015   mod/ sev left post shjouldeer lateral  tx w/s   Atypical mole 08/01/2015   mild right upper back no tx   Atypical mole 09/16/2021   left upper back (moderate to severe)   Bilateral carpal tunnel syndrome    COVID 04/08/2020   VDRF ARDS   04/16/20-extubated 05/04/2020   Crohn disease (HCC)    Diabetes mellitus    diet controlled   DVT (deep venous thrombosis) (HCC) 05/30/2020   Heart murmur    mild   High cholesterol    Hypertension    pcp  Dr Aurther Loft Reuel Boom    [place]   IBS (irritable bowel syndrome)    Obesity    Pneumonia 2011   History of   Pulmonary embolism (HCC) 05/30/2020   DVT/PE    Sleep apnea 03-2009 study   pt forgot to mention at pre admit visit.  uses c-pap brought her own  machine from home.    Trigger thumb, right thumb     Past Surgical History:  Procedure Laterality Date   CARDIAC CATHETERIZATION  05/30/2020   Removal pulmonary embolus    [facility]   CARPAL TUNNEL RELEASE Right 09/20/2019   Procedure: CARPAL TUNNEL RELEASE;  Surgeon: Bjorn Pippin, MD;  [facility];   Service: Orthopedics;  Laterality: Right;   CARPAL TUNNEL RELEASE Left 11/15/2019   Procedure: CARPAL TUNNEL RELEASE, EXCISION OF GANGLION CYST;  Surgeon: Bjorn Pippin, MD;  [facility];  Service: Orthopedics;  Laterality: Left;   CHOLECYSTECTOMY N/A 03/31/2013   Procedure: LAPAROSCOPIC CHOLECYSTECTOMY;  Surgeon: Dalia Heading, MD;  [facility];  Service: General;  Laterality: N/A;   COLONOSCOPY N/A 03/16/2013   Procedure: COLONOSCOPY;  Surgeon: Malissa Hippo, MD;  [facility];  Service: Endoscopy;  Laterality: N/A;  255   COLONOSCOPY N/A 12/31/2014   Procedure: COLONOSCOPY;  Surgeon: Malissa Hippo, MD;  [facility];  Service: Endoscopy;  Laterality: N/A;  730   COLONOSCOPY WITH PROPOFOL N/A 05/14/2021   Procedure: COLONOSCOPY WITH PROPOFOL;  Surgeon: Malissa Hippo, MD;  [facility];  Service: Endoscopy;  Laterality: N/A;  915   CYST REMOVAL HAND     cyst removed from left hand     FLEXIBLE SIGMOIDOSCOPY N/A 08/21/2014   Procedure: FLEXIBLE SIGMOIDOSCOPY;  Surgeon: Malissa Hippo, MD;  [facility];  Service: Endoscopy;  Laterality: N/A;  1030   POLYPECTOMY  05/14/2021   Procedure:  [name]; [id number]; 05/24/1966   HPI Patient is a 57 year old white female who was referred to my care by Dr. Reuel Boom for evaluation and treatment of a periumbilical hernia.  Patient states that she first noticed pain around her umbilicus approximately 6 months ago.  She did notice some swelling above the umbilicus and it is made worse with straining.  No nausea or vomiting have been noted.  She has not had an episode of incarceration.  She did have an episode of a spot of blood coming from the umbilicus.  This has since resolved.  She has never had cellulitis of that area.  She is status post a laparoscopic cholecystectomy in 2014. Past Medical History:  Diagnosis Date   Anxiety    No medications currently   Arthritis    Asthma    Atypical mole 06/21/2015   mod/ sev left upper back tx w/s   Atypical mole 06/21/2015   mod/sev left post shoulder med tx w/s   Atypical mole 06/21/2015   mod/ sev left post shjouldeer lateral  tx w/s   Atypical mole 08/01/2015   mild right upper back no tx   Atypical mole 09/16/2021   left upper back (moderate to severe)   Bilateral carpal tunnel syndrome    COVID 04/08/2020   VDRF ARDS   04/16/20-extubated 05/04/2020   Crohn disease (HCC)    Diabetes mellitus    diet controlled   DVT (deep venous thrombosis) (HCC) 05/30/2020   Heart murmur    mild   High cholesterol    Hypertension    pcp  Dr Aurther Loft Reuel Boom    [place]   IBS (irritable bowel syndrome)    Obesity    Pneumonia 2011   History of   Pulmonary embolism (HCC) 05/30/2020   DVT/PE    Sleep apnea 03-2009 study   pt forgot to mention at pre admit visit.  uses c-pap brought her own  machine from home.    Trigger thumb, right thumb     Past Surgical History:  Procedure Laterality Date   CARDIAC CATHETERIZATION  05/30/2020   Removal pulmonary embolus    [facility]   CARPAL TUNNEL RELEASE Right 09/20/2019   Procedure: CARPAL TUNNEL RELEASE;  Surgeon: Bjorn Pippin, MD;  [facility];   Service: Orthopedics;  Laterality: Right;   CARPAL TUNNEL RELEASE Left 11/15/2019   Procedure: CARPAL TUNNEL RELEASE, EXCISION OF GANGLION CYST;  Surgeon: Bjorn Pippin, MD;  [facility];  Service: Orthopedics;  Laterality: Left;   CHOLECYSTECTOMY N/A 03/31/2013   Procedure: LAPAROSCOPIC CHOLECYSTECTOMY;  Surgeon: Dalia Heading, MD;  [facility];  Service: General;  Laterality: N/A;   COLONOSCOPY N/A 03/16/2013   Procedure: COLONOSCOPY;  Surgeon: Malissa Hippo, MD;  [facility];  Service: Endoscopy;  Laterality: N/A;  255   COLONOSCOPY N/A 12/31/2014   Procedure: COLONOSCOPY;  Surgeon: Malissa Hippo, MD;  [facility];  Service: Endoscopy;  Laterality: N/A;  730   COLONOSCOPY WITH PROPOFOL N/A 05/14/2021   Procedure: COLONOSCOPY WITH PROPOFOL;  Surgeon: Malissa Hippo, MD;  [facility];  Service: Endoscopy;  Laterality: N/A;  915   CYST REMOVAL HAND     cyst removed from left hand     FLEXIBLE SIGMOIDOSCOPY N/A 08/21/2014   Procedure: FLEXIBLE SIGMOIDOSCOPY;  Surgeon: Malissa Hippo, MD;  [facility];  Service: Endoscopy;  Laterality: N/A;  1030   POLYPECTOMY  05/14/2021   Procedure:  [name]; [id number]; 05/24/1966   HPI Patient is a 57 year old white female who was referred to my care by Dr. Reuel Boom for evaluation and treatment of a periumbilical hernia.  Patient states that she first noticed pain around her umbilicus approximately 6 months ago.  She did notice some swelling above the umbilicus and it is made worse with straining.  No nausea or vomiting have been noted.  She has not had an episode of incarceration.  She did have an episode of a spot of blood coming from the umbilicus.  This has since resolved.  She has never had cellulitis of that area.  She is status post a laparoscopic cholecystectomy in 2014. Past Medical History:  Diagnosis Date   Anxiety    No medications currently   Arthritis    Asthma    Atypical mole 06/21/2015   mod/ sev left upper back tx w/s   Atypical mole 06/21/2015   mod/sev left post shoulder med tx w/s   Atypical mole 06/21/2015   mod/ sev left post shjouldeer lateral  tx w/s   Atypical mole 08/01/2015   mild right upper back no tx   Atypical mole 09/16/2021   left upper back (moderate to severe)   Bilateral carpal tunnel syndrome    COVID 04/08/2020   VDRF ARDS   04/16/20-extubated 05/04/2020   Crohn disease (HCC)    Diabetes mellitus    diet controlled   DVT (deep venous thrombosis) (HCC) 05/30/2020   Heart murmur    mild   High cholesterol    Hypertension    pcp  Dr Aurther Loft Reuel Boom    [place]   IBS (irritable bowel syndrome)    Obesity    Pneumonia 2011   History of   Pulmonary embolism (HCC) 05/30/2020   DVT/PE    Sleep apnea 03-2009 study   pt forgot to mention at pre admit visit.  uses c-pap brought her own  machine from home.    Trigger thumb, right thumb     Past Surgical History:  Procedure Laterality Date   CARDIAC CATHETERIZATION  05/30/2020   Removal pulmonary embolus    [facility]   CARPAL TUNNEL RELEASE Right 09/20/2019   Procedure: CARPAL TUNNEL RELEASE;  Surgeon: Bjorn Pippin, MD;  [facility];   Service: Orthopedics;  Laterality: Right;   CARPAL TUNNEL RELEASE Left 11/15/2019   Procedure: CARPAL TUNNEL RELEASE, EXCISION OF GANGLION CYST;  Surgeon: Bjorn Pippin, MD;  [facility];  Service: Orthopedics;  Laterality: Left;   CHOLECYSTECTOMY N/A 03/31/2013   Procedure: LAPAROSCOPIC CHOLECYSTECTOMY;  Surgeon: Dalia Heading, MD;  [facility];  Service: General;  Laterality: N/A;   COLONOSCOPY N/A 03/16/2013   Procedure: COLONOSCOPY;  Surgeon: Malissa Hippo, MD;  [facility];  Service: Endoscopy;  Laterality: N/A;  255   COLONOSCOPY N/A 12/31/2014   Procedure: COLONOSCOPY;  Surgeon: Malissa Hippo, MD;  [facility];  Service: Endoscopy;  Laterality: N/A;  730   COLONOSCOPY WITH PROPOFOL N/A 05/14/2021   Procedure: COLONOSCOPY WITH PROPOFOL;  Surgeon: Malissa Hippo, MD;  [facility];  Service: Endoscopy;  Laterality: N/A;  915   CYST REMOVAL HAND     cyst removed from left hand     FLEXIBLE SIGMOIDOSCOPY N/A 08/21/2014   Procedure: FLEXIBLE SIGMOIDOSCOPY;  Surgeon: Malissa Hippo, MD;  [facility];  Service: Endoscopy;  Laterality: N/A;  1030   POLYPECTOMY  05/14/2021   Procedure:

## 2023-07-06 ENCOUNTER — Encounter (INDEPENDENT_AMBULATORY_CARE_PROVIDER_SITE_OTHER): Payer: Self-pay

## 2023-07-06 ENCOUNTER — Other Ambulatory Visit (INDEPENDENT_AMBULATORY_CARE_PROVIDER_SITE_OTHER): Payer: Self-pay | Admitting: Gastroenterology

## 2023-07-06 NOTE — Telephone Encounter (Signed)
I sent a my chart message asking the patient if she is current needing this medication.

## 2023-07-06 NOTE — Telephone Encounter (Signed)
I called the patient to see if she is still taking this.

## 2023-07-07 ENCOUNTER — Other Ambulatory Visit (INDEPENDENT_AMBULATORY_CARE_PROVIDER_SITE_OTHER): Payer: Self-pay | Admitting: *Deleted

## 2023-07-07 MED ORDER — DICYCLOMINE HCL 20 MG PO TABS: ORAL_TABLET | ORAL | 5 refills | Status: DC

## 2023-07-07 NOTE — Telephone Encounter (Signed)
Per Warner Mccreedy, here she spoke with the patient and the patient is still taking this. Toniann Fail has sent this script to [facility] to reorder as this had been removed from the patient's chart.

## 2023-07-07 NOTE — Telephone Encounter (Signed)
Called patient and she said she is taking this med every day. Can it be put back on list?

## 2023-07-08 NOTE — H&P (Signed)
[name]; [id number]; 05/24/1966   HPI Patient is a 57 year old white female who was referred to my care by Dr. Reuel Boom for evaluation and treatment of a periumbilical hernia.  Patient states that she first noticed pain around her umbilicus approximately 6 months ago.  She did notice some swelling above the umbilicus and it is made worse with straining.  No nausea or vomiting have been noted.  She has not had an episode of incarceration.  She did have an episode of a spot of blood coming from the umbilicus.  This has since resolved.  She has never had cellulitis of that area.  She is status post a laparoscopic cholecystectomy in 2014. Past Medical History:  Diagnosis Date   Anxiety    No medications currently   Arthritis    Asthma    Atypical mole 06/21/2015   mod/ sev left upper back tx w/s   Atypical mole 06/21/2015   mod/sev left post shoulder med tx w/s   Atypical mole 06/21/2015   mod/ sev left post shjouldeer lateral  tx w/s   Atypical mole 08/01/2015   mild right upper back no tx   Atypical mole 09/16/2021   left upper back (moderate to severe)   Bilateral carpal tunnel syndrome    COVID 04/08/2020   VDRF ARDS   04/16/20-extubated 05/04/2020   Crohn disease (HCC)    Diabetes mellitus    diet controlled   DVT (deep venous thrombosis) (HCC) 05/30/2020   Heart murmur    mild   High cholesterol    Hypertension    pcp  Dr Aurther Loft Reuel Boom    [place]   IBS (irritable bowel syndrome)    Obesity    Pneumonia 2011   History of   Pulmonary embolism (HCC) 05/30/2020   DVT/PE    Sleep apnea 03-2009 study   pt forgot to mention at pre admit visit.  uses c-pap brought her own  machine from home.    Trigger thumb, right thumb     Past Surgical History:  Procedure Laterality Date   CARDIAC CATHETERIZATION  05/30/2020   Removal pulmonary embolus    [facility]   CARPAL TUNNEL RELEASE Right 09/20/2019   Procedure: CARPAL TUNNEL RELEASE;  Surgeon: Bjorn Pippin, MD;  [facility];   Service: Orthopedics;  Laterality: Right;   CARPAL TUNNEL RELEASE Left 11/15/2019   Procedure: CARPAL TUNNEL RELEASE, EXCISION OF GANGLION CYST;  Surgeon: Bjorn Pippin, MD;  [facility];  Service: Orthopedics;  Laterality: Left;   CHOLECYSTECTOMY N/A 03/31/2013   Procedure: LAPAROSCOPIC CHOLECYSTECTOMY;  Surgeon: Dalia Heading, MD;  [facility];  Service: General;  Laterality: N/A;   COLONOSCOPY N/A 03/16/2013   Procedure: COLONOSCOPY;  Surgeon: Malissa Hippo, MD;  [facility];  Service: Endoscopy;  Laterality: N/A;  255   COLONOSCOPY N/A 12/31/2014   Procedure: COLONOSCOPY;  Surgeon: Malissa Hippo, MD;  [facility];  Service: Endoscopy;  Laterality: N/A;  730   COLONOSCOPY WITH PROPOFOL N/A 05/14/2021   Procedure: COLONOSCOPY WITH PROPOFOL;  Surgeon: Malissa Hippo, MD;  [facility];  Service: Endoscopy;  Laterality: N/A;  915   CYST REMOVAL HAND     cyst removed from left hand     FLEXIBLE SIGMOIDOSCOPY N/A 08/21/2014   Procedure: FLEXIBLE SIGMOIDOSCOPY;  Surgeon: Malissa Hippo, MD;  [facility];  Service: Endoscopy;  Laterality: N/A;  1030   POLYPECTOMY  05/14/2021   Procedure:  [name]; [id number]; 05/24/1966   HPI Patient is a 57 year old white female who was referred to my care by Dr. Reuel Boom for evaluation and treatment of a periumbilical hernia.  Patient states that she first noticed pain around her umbilicus approximately 6 months ago.  She did notice some swelling above the umbilicus and it is made worse with straining.  No nausea or vomiting have been noted.  She has not had an episode of incarceration.  She did have an episode of a spot of blood coming from the umbilicus.  This has since resolved.  She has never had cellulitis of that area.  She is status post a laparoscopic cholecystectomy in 2014. Past Medical History:  Diagnosis Date   Anxiety    No medications currently   Arthritis    Asthma    Atypical mole 06/21/2015   mod/ sev left upper back tx w/s   Atypical mole 06/21/2015   mod/sev left post shoulder med tx w/s   Atypical mole 06/21/2015   mod/ sev left post shjouldeer lateral  tx w/s   Atypical mole 08/01/2015   mild right upper back no tx   Atypical mole 09/16/2021   left upper back (moderate to severe)   Bilateral carpal tunnel syndrome    COVID 04/08/2020   VDRF ARDS   04/16/20-extubated 05/04/2020   Crohn disease (HCC)    Diabetes mellitus    diet controlled   DVT (deep venous thrombosis) (HCC) 05/30/2020   Heart murmur    mild   High cholesterol    Hypertension    pcp  Dr Aurther Loft Reuel Boom    [place]   IBS (irritable bowel syndrome)    Obesity    Pneumonia 2011   History of   Pulmonary embolism (HCC) 05/30/2020   DVT/PE    Sleep apnea 03-2009 study   pt forgot to mention at pre admit visit.  uses c-pap brought her own  machine from home.    Trigger thumb, right thumb     Past Surgical History:  Procedure Laterality Date   CARDIAC CATHETERIZATION  05/30/2020   Removal pulmonary embolus    [facility]   CARPAL TUNNEL RELEASE Right 09/20/2019   Procedure: CARPAL TUNNEL RELEASE;  Surgeon: Bjorn Pippin, MD;  [facility];   Service: Orthopedics;  Laterality: Right;   CARPAL TUNNEL RELEASE Left 11/15/2019   Procedure: CARPAL TUNNEL RELEASE, EXCISION OF GANGLION CYST;  Surgeon: Bjorn Pippin, MD;  [facility];  Service: Orthopedics;  Laterality: Left;   CHOLECYSTECTOMY N/A 03/31/2013   Procedure: LAPAROSCOPIC CHOLECYSTECTOMY;  Surgeon: Dalia Heading, MD;  [facility];  Service: General;  Laterality: N/A;   COLONOSCOPY N/A 03/16/2013   Procedure: COLONOSCOPY;  Surgeon: Malissa Hippo, MD;  [facility];  Service: Endoscopy;  Laterality: N/A;  255   COLONOSCOPY N/A 12/31/2014   Procedure: COLONOSCOPY;  Surgeon: Malissa Hippo, MD;  [facility];  Service: Endoscopy;  Laterality: N/A;  730   COLONOSCOPY WITH PROPOFOL N/A 05/14/2021   Procedure: COLONOSCOPY WITH PROPOFOL;  Surgeon: Malissa Hippo, MD;  [facility];  Service: Endoscopy;  Laterality: N/A;  915   CYST REMOVAL HAND     cyst removed from left hand     FLEXIBLE SIGMOIDOSCOPY N/A 08/21/2014   Procedure: FLEXIBLE SIGMOIDOSCOPY;  Surgeon: Malissa Hippo, MD;  [facility];  Service: Endoscopy;  Laterality: N/A;  1030   POLYPECTOMY  05/14/2021   Procedure:  [name]; [id number]; 05/24/1966   HPI Patient is a 57 year old white female who was referred to my care by Dr. Reuel Boom for evaluation and treatment of a periumbilical hernia.  Patient states that she first noticed pain around her umbilicus approximately 6 months ago.  She did notice some swelling above the umbilicus and it is made worse with straining.  No nausea or vomiting have been noted.  She has not had an episode of incarceration.  She did have an episode of a spot of blood coming from the umbilicus.  This has since resolved.  She has never had cellulitis of that area.  She is status post a laparoscopic cholecystectomy in 2014. Past Medical History:  Diagnosis Date   Anxiety    No medications currently   Arthritis    Asthma    Atypical mole 06/21/2015   mod/ sev left upper back tx w/s   Atypical mole 06/21/2015   mod/sev left post shoulder med tx w/s   Atypical mole 06/21/2015   mod/ sev left post shjouldeer lateral  tx w/s   Atypical mole 08/01/2015   mild right upper back no tx   Atypical mole 09/16/2021   left upper back (moderate to severe)   Bilateral carpal tunnel syndrome    COVID 04/08/2020   VDRF ARDS   04/16/20-extubated 05/04/2020   Crohn disease (HCC)    Diabetes mellitus    diet controlled   DVT (deep venous thrombosis) (HCC) 05/30/2020   Heart murmur    mild   High cholesterol    Hypertension    pcp  Dr Aurther Loft Reuel Boom    [place]   IBS (irritable bowel syndrome)    Obesity    Pneumonia 2011   History of   Pulmonary embolism (HCC) 05/30/2020   DVT/PE    Sleep apnea 03-2009 study   pt forgot to mention at pre admit visit.  uses c-pap brought her own  machine from home.    Trigger thumb, right thumb     Past Surgical History:  Procedure Laterality Date   CARDIAC CATHETERIZATION  05/30/2020   Removal pulmonary embolus    [facility]   CARPAL TUNNEL RELEASE Right 09/20/2019   Procedure: CARPAL TUNNEL RELEASE;  Surgeon: Bjorn Pippin, MD;  [facility];   Service: Orthopedics;  Laterality: Right;   CARPAL TUNNEL RELEASE Left 11/15/2019   Procedure: CARPAL TUNNEL RELEASE, EXCISION OF GANGLION CYST;  Surgeon: Bjorn Pippin, MD;  [facility];  Service: Orthopedics;  Laterality: Left;   CHOLECYSTECTOMY N/A 03/31/2013   Procedure: LAPAROSCOPIC CHOLECYSTECTOMY;  Surgeon: Dalia Heading, MD;  [facility];  Service: General;  Laterality: N/A;   COLONOSCOPY N/A 03/16/2013   Procedure: COLONOSCOPY;  Surgeon: Malissa Hippo, MD;  [facility];  Service: Endoscopy;  Laterality: N/A;  255   COLONOSCOPY N/A 12/31/2014   Procedure: COLONOSCOPY;  Surgeon: Malissa Hippo, MD;  [facility];  Service: Endoscopy;  Laterality: N/A;  730   COLONOSCOPY WITH PROPOFOL N/A 05/14/2021   Procedure: COLONOSCOPY WITH PROPOFOL;  Surgeon: Malissa Hippo, MD;  [facility];  Service: Endoscopy;  Laterality: N/A;  915   CYST REMOVAL HAND     cyst removed from left hand     FLEXIBLE SIGMOIDOSCOPY N/A 08/21/2014   Procedure: FLEXIBLE SIGMOIDOSCOPY;  Surgeon: Malissa Hippo, MD;  [facility];  Service: Endoscopy;  Laterality: N/A;  1030   POLYPECTOMY  05/14/2021   Procedure:

## 2023-07-27 NOTE — Patient Instructions (Signed)
[name]  07/27/2023     @PREFPERIOPPHARMACY @   Your procedure is scheduled on  08/04/2023.   Report to [facility] at  0600  A.M.   Call this number if you have problems the morning of surgery:  [phone]  If you experience any cold or flu symptoms such as cough, fever, chills, shortness of breath, etc. between now and your scheduled surgery, please notify us at the above number.   Remember:        Your last dose of jardiance should be on 07/31/2023.   Do not eat after midnight.   You may drink clear liquids until 0330 am on 08/04/2023.    Clear liquids allowed are:                    Water, Juice (No red color; non-citric and without pulp; diabetics please choose diet or no sugar options), Carbonated beverages (diabetics please choose diet or no sugar options), Clear Tea (No creamer, milk, or cream, including half & half and powdered creamer), Black Coffee Only (No creamer, milk or cream, including half & half and powdered creamer), and Clear Sports drink (No red color; diabetics please choose diet or no sugar options)    Take these medicines the morning of surgery with A SIP OF WATER         buspirone, duloxetine, mesalamine, pantoprazole.     Do not wear jewelry, make-up or nail polish, including gel polish,  artificial nails, or any other type of covering on natural nails (fingers and  toes).  Do not wear lotions, powders, or perfumes, or deodorant.  Do not shave 48 hours prior to surgery.  Men may shave face and neck.  Do not bring valuables to the hospital.  [facility] is not responsible for any belongings or valuables.  Contacts, dentures or bridgework may not be worn into surgery.  Leave your suitcase in the car.  After surgery it may be brought to your room.  For patients admitted to the hospital, discharge time will be determined by your treatment team.  Patients discharged the day of surgery will not be allowed to drive home and must  have someone with them for 24 hours.    Special instructions:   DO NOT smoke tobacco or vape for 24 hours before your procedure.  Please read over the following fact sheets that you were given. Coughing and Deep Breathing, Anesthesia Post-op Instructions, and Care and Recovery After Surgery      Laparoscopic Ventral Hernia Repair, Care After The following information offers guidance on how to care for yourself after your procedure. Your health care provider may also give you more specific instructions. If you have problems or questions, contact your health care provider. What can I expect after the procedure? After the procedure, it is common to have pain, discomfort, or soreness. Follow these instructions at home: Medicines Take over-the-counter and prescription medicines only as told by your health care provider. Ask your health care provider if the medicine prescribed to you: Requires you to avoid driving or using machinery. Can cause constipation. You may need to take these actions to prevent or treat constipation: Drink enough fluid to keep your urine pale yellow. Take over-the-counter or prescription medicines. Eat foods that are high in fiber, such as beans, whole grains, and fresh fruits and vegetables. Limit foods that are high in fat and processed sugars, such as fried or sweet foods.  Incision care  Follow instructions from your health care provider about how to take care of your incisions. Make sure you: Wash your hands with soap and water for at least 20 seconds before and after you change your bandage (dressing) or before you touch your abdomen. If soap and water are not available, use hand sanitizer. Change your dressing as told by your health care provider. Leave stitches (sutures), skin glue, or adhesive strips in place. These skin closures may need to stay in place for 2 weeks or longer. If adhesive strip edges start to loosen and curl up, you may trim the loose edges.  Do not remove adhesive strips completely unless your health care provider tells you to do that. Check your incision areas every day for signs of infection. Check for: More redness, swelling, or pain. Fluid or blood. Warmth. Pus or a bad smell. Bathing  Do not take baths, swim, or use a hot tub until your health care provider approves. Ask your health care provider if you may take showers. You may only be allowed to take sponge baths. Keep your dressing dry until your health care provider says it can be removed. Activity  Rest as told by your health care provider. Avoid sitting for a long time without moving. Get up to take short walks every 1-2 hours. This is important to improve blood flow and breathing. Ask for help if you feel weak or unsteady. Do not lift anything that is heavier than 10 lb (4.5 kg), or the limit that you are told, until your health care provider says that it is safe. If you were given a sedative during the procedure, it can affect you for several hours. Do not drive or operate machinery until your health care provider says that it is safe. Return to your normal activities as told by your health care provider. Ask your health care provider what activities are safe for you. General instructions  Hold a pillow over your abdomen when you cough or sneeze. This helps with pain. Do not use any products that contain nicotine or tobacco. These products include cigarettes, chewing tobacco, and vaping devices, such as e-cigarettes. These can delay healing after surgery. If you need help quitting, ask your health care provider. You may be asked to continue to do deep breathing exercises at home. This will help to prevent a lung infection. Keep all follow-up visits. This is important. Contact a health care provider if: You have any of these signs of infection: More redness, swelling, or pain around an incision. Fluid or blood coming from an incision. Warmth coming from an  incision. Pus or a bad smell coming from an incision. A fever or chills. You have pain that gets worse or does not get better with medicine. You have nausea or vomiting. You have a cough. You have shortness of breath. You have not had a bowel movement in 3 days. You are not able to urinate. Get help right away if you have: Severe pain in your abdomen. Persistent nausea and vomiting. Redness, warmth, or pain in your leg. Chest pain. Trouble breathing. These symptoms may represent a serious problem that is an emergency. Do not wait to see if the symptoms will go away. Get medical help right away. Call your local emergency services (911 in the [place] Do not drive yourself to the hospital. Summary After this procedure, it is common to have pain, discomfort, or soreness. Follow instructions from your health care provider about how to take care  of your incision. Check your incision area every day for signs of infection. Report any signs of infection to your health care provider. Keep all follow-up visits. This is important. This information is not intended to replace advice given to you by your health care provider. Make sure you discuss any questions you have with your health care provider. Document Revised: 04/05/2020 Document Reviewed: 04/05/2020 Elsevier Patient Education  2024 [employer] General Anesthesia, Adult, Care After The following information offers guidance on how to care for yourself after your procedure. Your health care provider may also give you more specific instructions. If you have problems or questions, contact your health care provider. What can I expect after the procedure? After the procedure, it is common for people to: Have pain or discomfort at the IV site. Have nausea or vomiting. Have a sore throat or hoarseness. Have trouble concentrating. Feel cold or chills. Feel weak, sleepy, or tired (fatigue). Have soreness and body aches. These can affect parts of  the body that were not involved in surgery. Follow these instructions at home: For the time period you were told by your health care provider:  Rest. Do not participate in activities where you could fall or become injured. Do not drive or use machinery. Do not drink alcohol. Do not take sleeping pills or medicines that cause drowsiness. Do not make important decisions or sign legal documents. Do not take care of children on your own. General instructions Drink enough fluid to keep your urine pale yellow. If you have sleep apnea, surgery and certain medicines can increase your risk for breathing problems. Follow instructions from your health care provider about wearing your sleep device: Anytime you are sleeping, including during daytime naps. While taking prescription pain medicines, sleeping medicines, or medicines that make you drowsy. Return to your normal activities as told by your health care provider. Ask your health care provider what activities are safe for you. Take over-the-counter and prescription medicines only as told by your health care provider. Do not use any products that contain nicotine or tobacco. These products include cigarettes, chewing tobacco, and vaping devices, such as e-cigarettes. These can delay incision healing after surgery. If you need help quitting, ask your health care provider. Contact a health care provider if: You have nausea or vomiting that does not get better with medicine. You vomit every time you eat or drink. You have pain that does not get better with medicine. You cannot urinate or have bloody urine. You develop a skin rash. You have a fever. Get help right away if: You have trouble breathing. You have chest pain. You vomit blood. These symptoms may be an emergency. Get help right away. Call 911. Do not wait to see if the symptoms will go away. Do not drive yourself to the hospital. Summary After the procedure, it is common to have a  sore throat, hoarseness, nausea, vomiting, or to feel weak, sleepy, or fatigue. For the time period you were told by your health care provider, do not drive or use machinery. Get help right away if you have difficulty breathing, have chest pain, or vomit blood. These symptoms may be an emergency. This information is not intended to replace advice given to you by your health care provider. Make sure you discuss any questions you have with your health care provider. Document Revised: 11/14/2021 Document Reviewed: 11/14/2021 Elsevier Patient Education  2024 [employer] Inc. How to Use Chlorhexidine Before Surgery Chlorhexidine gluconate (CHG) is a germ-killing (antiseptic) solution that  is used to clean the skin. It can get rid of the bacteria that normally live on the skin and can keep them away for about 24 hours. To clean your skin with CHG, you may be given: A CHG solution to use in the shower or as part of a sponge bath. A prepackaged cloth that contains CHG. Cleaning your skin with CHG may help lower the risk for infection: While you are staying in the intensive care unit of the hospital. If you have a vascular access, such as a central line, to provide short-term or long-term access to your veins. If you have a catheter to drain urine from your bladder. If you are on a ventilator. A ventilator is a machine that helps you breathe by moving air in and out of your lungs. After surgery. What are the risks? Risks of using CHG include: A skin reaction. Hearing loss, if CHG gets in your ears and you have a perforated eardrum. Eye injury, if CHG gets in your eyes and is not rinsed out. The CHG product catching fire. Make sure that you avoid smoking and flames after applying CHG to your skin. Do not use CHG: If you have a chlorhexidine allergy or have previously reacted to chlorhexidine. On babies younger than 51 months of age. How to use CHG solution Use CHG only as told by your health care  provider, and follow the instructions on the label. Use the full amount of CHG as directed. Usually, this is one bottle. During a shower Follow these steps when using CHG solution during a shower (unless your health care provider gives you different instructions): Start the shower. Use your normal soap and shampoo to wash your face and hair. Turn off the shower or move out of the shower stream. Pour the CHG onto a clean washcloth. Do not use any type of brush or rough-edged sponge. Starting at your neck, lather your body down to your toes. Make sure you follow these instructions: If you will be having surgery, pay special attention to the part of your body where you will be having surgery. Scrub this area for at least 1 minute. Do not use CHG on your head or face. If the solution gets into your ears or eyes, rinse them well with water. Avoid your genital area. Avoid any areas of skin that have broken skin, cuts, or scrapes. Scrub your back and under your arms. Make sure to wash skin folds. Let the lather sit on your skin for 1-2 minutes or as long as told by your health care provider. Thoroughly rinse your entire body in the shower. Make sure that all body creases and crevices are rinsed well. Dry off with a clean towel. Do not put any substances on your body afterward--such as powder, lotion, or perfume--unless you are told to do so by your health care provider. Only use lotions that are recommended by the manufacturer. Put on clean clothes or pajamas. If it is the night before your surgery, sleep in clean sheets.  During a sponge bath Follow these steps when using CHG solution during a sponge bath (unless your health care provider gives you different instructions): Use your normal soap and shampoo to wash your face and hair. Pour the CHG onto a clean washcloth. Starting at your neck, lather your body down to your toes. Make sure you follow these instructions: If you will be having surgery,  pay special attention to the part of your body where you will be having  surgery. Scrub this area for at least 1 minute. Do not use CHG on your head or face. If the solution gets into your ears or eyes, rinse them well with water. Avoid your genital area. Avoid any areas of skin that have broken skin, cuts, or scrapes. Scrub your back and under your arms. Make sure to wash skin folds. Let the lather sit on your skin for 1-2 minutes or as long as told by your health care provider. Using a different clean, wet washcloth, thoroughly rinse your entire body. Make sure that all body creases and crevices are rinsed well. Dry off with a clean towel. Do not put any substances on your body afterward--such as powder, lotion, or perfume--unless you are told to do so by your health care provider. Only use lotions that are recommended by the manufacturer. Put on clean clothes or pajamas. If it is the night before your surgery, sleep in clean sheets. How to use CHG prepackaged cloths Only use CHG cloths as told by your health care provider, and follow the instructions on the label. Use the CHG cloth on clean, dry skin. Do not use the CHG cloth on your head or face unless your health care provider tells you to. When washing with the CHG cloth: Avoid your genital area. Avoid any areas of skin that have broken skin, cuts, or scrapes. Before surgery Follow these steps when using a CHG cloth to clean before surgery (unless your health care provider gives you different instructions): Using the CHG cloth, vigorously scrub the part of your body where you will be having surgery. Scrub using a back-and-forth motion for 3 minutes. The area on your body should be completely wet with CHG when you are done scrubbing. Do not rinse. Discard the cloth and let the area air-dry. Do not put any substances on the area afterward, such as powder, lotion, or perfume. Put on clean clothes or pajamas. If it is the night before your  surgery, sleep in clean sheets.  For general bathing Follow these steps when using CHG cloths for general bathing (unless your health care provider gives you different instructions). Use a separate CHG cloth for each area of your body. Make sure you wash between any folds of skin and between your fingers and toes. Wash your body in the following order, switching to a new cloth after each step: The front of your neck, shoulders, and chest. Both of your arms, under your arms, and your hands. Your stomach and groin area, avoiding the genitals. Your right leg and foot. Your left leg and foot. The back of your neck, your back, and your buttocks. Do not rinse. Discard the cloth and let the area air-dry. Do not put any substances on your body afterward--such as powder, lotion, or perfume--unless you are told to do so by your health care provider. Only use lotions that are recommended by the manufacturer. Put on clean clothes or pajamas. Contact a health care provider if: Your skin gets irritated after scrubbing. You have questions about using your solution or cloth. You swallow any chlorhexidine. Call your local poison control center [phone] in the [place] Get help right away if: Your eyes itch badly, or they become very red or swollen. Your skin itches badly and is red or swollen. Your hearing changes. You have trouble seeing. You have swelling or tingling in your mouth or throat. You have trouble breathing. These symptoms may represent a serious problem that is an emergency. Do not wait to  see if the symptoms will go away. Get medical help right away. Call your local emergency services (911 in the [place] Do not drive yourself to the hospital. Summary Chlorhexidine gluconate (CHG) is a germ-killing (antiseptic) solution that is used to clean the skin. Cleaning your skin with CHG may help to lower your risk for infection. You may be given CHG to use for bathing. It may be in a bottle or in  a prepackaged cloth to use on your skin. Carefully follow your health care provider's instructions and the instructions on the product label. Do not use CHG if you have a chlorhexidine allergy. Contact your health care provider if your skin gets irritated after scrubbing. This information is not intended to replace advice given to you by your health care provider. Make sure you discuss any questions you have with your health care provider. Document Revised: 12/15/2021 Document Reviewed: 10/28/2020 Elsevier Patient Education  2023 [employer].

## 2023-08-02 ENCOUNTER — Encounter (HOSPITAL_COMMUNITY): Payer: Self-pay

## 2023-08-02 ENCOUNTER — Encounter (HOSPITAL_COMMUNITY)
Admission: RE | Admit: 2023-08-02 | Discharge: 2023-08-02 | Disposition: A | Discharge status: Home / Self Care | Payer: BC Managed Care – PPO | Source: Ambulatory Visit | Attending: General Surgery | Admitting: General Surgery

## 2023-08-02 VITALS — BP 135/65 | HR 96 | Temp 97.8°F | Resp 18 | Ht 63.0 in | Wt 261.0 lb

## 2023-08-02 DIAGNOSIS — Z9049 Acquired absence of other specified parts of digestive tract: Secondary | ICD-10-CM | POA: Diagnosis not present

## 2023-08-02 DIAGNOSIS — F419 Anxiety disorder, unspecified: Secondary | ICD-10-CM | POA: Diagnosis not present

## 2023-08-02 DIAGNOSIS — J45909 Unspecified asthma, uncomplicated: Secondary | ICD-10-CM | POA: Diagnosis not present

## 2023-08-02 DIAGNOSIS — E66813 Obesity, class 3: Secondary | ICD-10-CM | POA: Diagnosis not present

## 2023-08-02 DIAGNOSIS — Z01818 Encounter for other preprocedural examination: Secondary | ICD-10-CM | POA: Insufficient documentation

## 2023-08-02 DIAGNOSIS — Z86718 Personal history of other venous thrombosis and embolism: Secondary | ICD-10-CM | POA: Diagnosis not present

## 2023-08-02 DIAGNOSIS — K5090 Crohn's disease, unspecified, without complications: Secondary | ICD-10-CM | POA: Diagnosis not present

## 2023-08-02 DIAGNOSIS — M1990 Unspecified osteoarthritis, unspecified site: Secondary | ICD-10-CM | POA: Diagnosis not present

## 2023-08-02 DIAGNOSIS — G4730 Sleep apnea, unspecified: Secondary | ICD-10-CM | POA: Diagnosis not present

## 2023-08-02 DIAGNOSIS — E1159 Type 2 diabetes mellitus with other circulatory complications: Secondary | ICD-10-CM | POA: Insufficient documentation

## 2023-08-02 DIAGNOSIS — I10 Essential (primary) hypertension: Secondary | ICD-10-CM | POA: Diagnosis not present

## 2023-08-02 DIAGNOSIS — Z79899 Other long term (current) drug therapy: Secondary | ICD-10-CM | POA: Diagnosis not present

## 2023-08-02 DIAGNOSIS — K432 Incisional hernia without obstruction or gangrene: Secondary | ICD-10-CM | POA: Diagnosis not present

## 2023-08-02 DIAGNOSIS — Z6841 Body Mass Index (BMI) 40.0 and over, adult: Secondary | ICD-10-CM | POA: Insufficient documentation

## 2023-08-02 DIAGNOSIS — Z8616 Personal history of COVID-19: Secondary | ICD-10-CM | POA: Diagnosis not present

## 2023-08-02 DIAGNOSIS — K219 Gastro-esophageal reflux disease without esophagitis: Secondary | ICD-10-CM | POA: Diagnosis not present

## 2023-08-02 DIAGNOSIS — Z7984 Long term (current) use of oral hypoglycemic drugs: Secondary | ICD-10-CM | POA: Diagnosis not present

## 2023-08-02 DIAGNOSIS — G709 Myoneural disorder, unspecified: Secondary | ICD-10-CM | POA: Diagnosis not present

## 2023-08-02 DIAGNOSIS — K429 Umbilical hernia without obstruction or gangrene: Secondary | ICD-10-CM | POA: Diagnosis present

## 2023-08-02 DIAGNOSIS — E119 Type 2 diabetes mellitus without complications: Secondary | ICD-10-CM | POA: Diagnosis not present

## 2023-08-02 HISTORY — DX: Gastro-esophageal reflux disease without esophagitis: K21.9

## 2023-08-02 LAB — BASIC METABOLIC PANEL
Anion gap: 8 (ref 5–15)
BUN: 22 mg/dL — ABNORMAL HIGH (ref 6–20)
CO2: 27 mmol/L (ref 22–32)
Calcium: 8.9 mg/dL (ref 8.9–10.3)
Chloride: 104 mmol/L (ref 98–111)
Creatinine, Ser: 0.63 mg/dL (ref 0.44–1.00)
GFR, Estimated: >60 mL/min (ref >60)
Glucose, Bld: 116 mg/dL — ABNORMAL HIGH (ref 70–99)
Potassium: 3.7 mmol/L (ref 3.5–5.1)
Sodium: 139 mmol/L (ref 135–145)

## 2023-08-02 LAB — HEMOGLOBIN A1C
Hgb A1c MFr Bld: 5.8 % — ABNORMAL HIGH (ref 4.8–5.6)
Mean Plasma Glucose: 119.76 mg/dL

## 2023-08-03 NOTE — Anesthesia Preprocedure Evaluation (Signed)
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 on medications + DVT  Normal cardiovascular exam+ Valvular Problems/Murmurs  Rhythm:Regular Rate:Tachycardia + Systolic murmurs    Neuro/Psych   Anxiety      Neuromuscular disease    GI/Hepatic Neg liver ROS, Bowel prep,GERD  ,,Crohn's disease   Endo/Other  diabetes, Well Controlled, Type 2, Oral Hypoglycemic Agents  Class 3 obesity  Renal/GU Renal disease     Musculoskeletal  (+) Arthritis ,    Abdominal   Peds  Hematology   Anesthesia Other Findings   Reproductive/Obstetrics                             Anesthesia Physical Anesthesia Plan  ASA: 3  Anesthesia Plan: General   Post-op Pain Management: Dilaudid IV   Induction: Intravenous  PONV Risk Score and Plan: 3 and Midazolam, Ondansetron and Dexamethasone  Airway Management Planned: Oral ETT  Additional Equipment: None  Intra-op Plan:   Post-operative Plan: Extubation in OR  Informed Consent: I have reviewed the patients History and Physical, chart, labs and discussed the procedure including the risks, benefits and alternatives for the proposed anesthesia with the patient or authorized representative who has indicated his/her understanding and acceptance.     Dental  advisory given  Plan Discussed with: CRNA and Surgeon  Anesthesia Plan Comments:         Anesthesia Quick Evaluation

## 2023-08-04 ENCOUNTER — Ambulatory Visit (HOSPITAL_COMMUNITY): Payer: BC Managed Care – PPO | Admitting: Anesthesiology

## 2023-08-04 ENCOUNTER — Ambulatory Visit (HOSPITAL_COMMUNITY)
Admission: RE | Admit: 2023-08-04 | Discharge: 2023-08-04 | Disposition: A | Discharge status: Home / Self Care | Payer: BC Managed Care – PPO | Source: Ambulatory Visit | Attending: General Surgery | Admitting: General Surgery

## 2023-08-04 ENCOUNTER — Encounter (HOSPITAL_COMMUNITY)
Admission: RE | Disposition: A | Discharge status: Home / Self Care | Payer: Self-pay | Source: Ambulatory Visit | Attending: General Surgery

## 2023-08-04 ENCOUNTER — Ambulatory Visit (HOSPITAL_COMMUNITY): Payer: Self-pay | Admitting: Anesthesiology

## 2023-08-04 DIAGNOSIS — Z8616 Personal history of COVID-19: Secondary | ICD-10-CM | POA: Diagnosis not present

## 2023-08-04 DIAGNOSIS — Z86718 Personal history of other venous thrombosis and embolism: Secondary | ICD-10-CM | POA: Insufficient documentation

## 2023-08-04 DIAGNOSIS — K432 Incisional hernia without obstruction or gangrene: Secondary | ICD-10-CM | POA: Diagnosis not present

## 2023-08-04 DIAGNOSIS — J45909 Unspecified asthma, uncomplicated: Secondary | ICD-10-CM | POA: Diagnosis not present

## 2023-08-04 DIAGNOSIS — Z7984 Long term (current) use of oral hypoglycemic drugs: Secondary | ICD-10-CM | POA: Diagnosis not present

## 2023-08-04 DIAGNOSIS — F419 Anxiety disorder, unspecified: Secondary | ICD-10-CM | POA: Insufficient documentation

## 2023-08-04 DIAGNOSIS — K429 Umbilical hernia without obstruction or gangrene: Secondary | ICD-10-CM | POA: Diagnosis not present

## 2023-08-04 DIAGNOSIS — E66813 Obesity, class 3: Secondary | ICD-10-CM | POA: Insufficient documentation

## 2023-08-04 DIAGNOSIS — E119 Type 2 diabetes mellitus without complications: Secondary | ICD-10-CM | POA: Diagnosis not present

## 2023-08-04 DIAGNOSIS — G4730 Sleep apnea, unspecified: Secondary | ICD-10-CM | POA: Insufficient documentation

## 2023-08-04 DIAGNOSIS — K219 Gastro-esophageal reflux disease without esophagitis: Secondary | ICD-10-CM | POA: Diagnosis not present

## 2023-08-04 DIAGNOSIS — Z9049 Acquired absence of other specified parts of digestive tract: Secondary | ICD-10-CM | POA: Insufficient documentation

## 2023-08-04 DIAGNOSIS — Z79899 Other long term (current) drug therapy: Secondary | ICD-10-CM | POA: Insufficient documentation

## 2023-08-04 DIAGNOSIS — M1990 Unspecified osteoarthritis, unspecified site: Secondary | ICD-10-CM | POA: Insufficient documentation

## 2023-08-04 DIAGNOSIS — G709 Myoneural disorder, unspecified: Secondary | ICD-10-CM | POA: Insufficient documentation

## 2023-08-04 DIAGNOSIS — I10 Essential (primary) hypertension: Secondary | ICD-10-CM | POA: Diagnosis not present

## 2023-08-04 DIAGNOSIS — K5090 Crohn's disease, unspecified, without complications: Secondary | ICD-10-CM | POA: Insufficient documentation

## 2023-08-04 HISTORY — PX: XI ROBOTIC ASSISTED VENTRAL HERNIA: SHX6789

## 2023-08-04 LAB — GLUCOSE, CAPILLARY
Glucose-Capillary: 126 mg/dL — ABNORMAL HIGH (ref 70–99)
Glucose-Capillary: 161 mg/dL — ABNORMAL HIGH (ref 70–99)

## 2023-08-04 SURGERY — REPAIR, HERNIA, VENTRAL, ROBOT-ASSISTED
Anesthesia: General | Site: Abdomen

## 2023-08-04 MED ORDER — ONDANSETRON HCL 4 MG/2ML IJ SOLN
INTRAMUSCULAR | Status: AC
  Filled 2023-08-04: qty 2

## 2023-08-04 MED ORDER — ORAL CARE MOUTH RINSE: 15.0000 mL | Freq: Once | OROMUCOSAL | Status: AC

## 2023-08-04 MED ORDER — ROCURONIUM BROMIDE 10 MG/ML (PF) SYRINGE
PREFILLED_SYRINGE | INTRAVENOUS | Status: AC
  Filled 2023-08-04: qty 10

## 2023-08-04 MED ORDER — ROCURONIUM BROMIDE 100 MG/10ML IV SOLN
INTRAVENOUS | Status: DC | PRN
  Administered 2023-08-04: 70 mg via INTRAVENOUS

## 2023-08-04 MED ORDER — CEFAZOLIN SODIUM-DEXTROSE 2-4 GM/100ML-% IV SOLN
2.0000 g | INTRAVENOUS | Status: AC
  Administered 2023-08-04: 2 g via INTRAVENOUS
  Filled 2023-08-04: qty 100

## 2023-08-04 MED ORDER — METHOCARBAMOL 750 MG PO TABS: 750.0000 mg | ORAL_TABLET | Freq: Four times a day (QID) | ORAL | 1 refills | Status: DC | PRN

## 2023-08-04 MED ORDER — LIDOCAINE HCL (PF) 2 % IJ SOLN
INTRAMUSCULAR | Status: AC
  Filled 2023-08-04: qty 5

## 2023-08-04 MED ORDER — FENTANYL CITRATE (PF) 100 MCG/2ML IJ SOLN
INTRAMUSCULAR | Status: DC | PRN
  Administered 2023-08-04 (×2): 100 ug via INTRAVENOUS
  Administered 2023-08-04: 50 ug via INTRAVENOUS

## 2023-08-04 MED ORDER — CHLORHEXIDINE GLUCONATE CLOTH 2 % EX PADS
6.0000 | MEDICATED_PAD | Freq: Once | CUTANEOUS | Status: AC
  Administered 2023-08-04: 6 via TOPICAL

## 2023-08-04 MED ORDER — SUGAMMADEX SODIUM 500 MG/5ML IV SOLN
INTRAVENOUS | Status: DC | PRN
  Administered 2023-08-04: 200 mg via INTRAVENOUS

## 2023-08-04 MED ORDER — HYDROMORPHONE HCL 1 MG/ML IJ SOLN
0.2500 mg | INTRAMUSCULAR | Status: DC | PRN
  Administered 2023-08-04 (×3): 0.5 mg via INTRAVENOUS
  Filled 2023-08-04 (×3): qty 0.5

## 2023-08-04 MED ORDER — ONDANSETRON HCL 4 MG/2ML IJ SOLN
INTRAMUSCULAR | Status: DC | PRN
  Administered 2023-08-04: 4 mg via INTRAVENOUS

## 2023-08-04 MED ORDER — DEXAMETHASONE SODIUM PHOSPHATE 10 MG/ML IJ SOLN
INTRAMUSCULAR | Status: AC
  Filled 2023-08-04: qty 1

## 2023-08-04 MED ORDER — LACTATED RINGERS IV SOLN
INTRAVENOUS | Status: DC
Start: 2023-08-04 — End: 2023-08-04

## 2023-08-04 MED ORDER — LIDOCAINE HCL (CARDIAC) PF 100 MG/5ML IV SOSY
PREFILLED_SYRINGE | INTRAVENOUS | Status: DC | PRN
  Administered 2023-08-04: 60 mg via INTRAVENOUS

## 2023-08-04 MED ORDER — BUPIVACAINE HCL (PF) 0.5 % IJ SOLN
INTRAMUSCULAR | Status: AC
  Filled 2023-08-04: qty 30

## 2023-08-04 MED ORDER — KETOROLAC TROMETHAMINE 30 MG/ML IJ SOLN
30.0000 mg | Freq: Once | INTRAMUSCULAR | Status: AC
  Administered 2023-08-04: 30 mg via INTRAVENOUS
  Filled 2023-08-04: qty 1

## 2023-08-04 MED ORDER — OXYCODONE HCL 5 MG/5ML PO SOLN: 5.0000 mg | Freq: Once | ORAL | Status: DC | PRN

## 2023-08-04 MED ORDER — MIDAZOLAM HCL 5 MG/5ML IJ SOLN
INTRAMUSCULAR | Status: DC | PRN
  Administered 2023-08-04: 2 mg via INTRAVENOUS

## 2023-08-04 MED ORDER — MIDAZOLAM HCL 2 MG/2ML IJ SOLN
INTRAMUSCULAR | Status: AC
  Filled 2023-08-04: qty 2

## 2023-08-04 MED ORDER — DEXAMETHASONE SODIUM PHOSPHATE 10 MG/ML IJ SOLN
INTRAMUSCULAR | Status: DC | PRN
  Administered 2023-08-04: 10 mg via INTRAVENOUS

## 2023-08-04 MED ORDER — FENTANYL CITRATE (PF) 250 MCG/5ML IJ SOLN
INTRAMUSCULAR | Status: AC
  Filled 2023-08-04: qty 5

## 2023-08-04 MED ORDER — STERILE WATER FOR IRRIGATION IR SOLN
Status: DC | PRN
  Administered 2023-08-04: 500 mL

## 2023-08-04 MED ORDER — PROPOFOL 10 MG/ML IV BOLUS
INTRAVENOUS | Status: AC
  Filled 2023-08-04: qty 20

## 2023-08-04 MED ORDER — CHLORHEXIDINE GLUCONATE CLOTH 2 % EX PADS
6.0000 | MEDICATED_PAD | Freq: Once | CUTANEOUS | Status: DC
Start: 2023-08-04 — End: 2023-08-04

## 2023-08-04 MED ORDER — OXYCODONE HCL 5 MG PO TABS
5.0000 mg | ORAL_TABLET | Freq: Once | ORAL | Status: DC | PRN
  Filled 2023-08-04: qty 1

## 2023-08-04 MED ORDER — OXYCODONE HCL 5 MG PO TABS: 5.0000 mg | ORAL_TABLET | ORAL | 0 refills | Status: DC | PRN

## 2023-08-04 MED ORDER — CHLORHEXIDINE GLUCONATE 0.12 % MT SOLN
15.0000 mL | Freq: Once | OROMUCOSAL | Status: AC
  Administered 2023-08-04: 15 mL via OROMUCOSAL
  Filled 2023-08-04: qty 15

## 2023-08-04 MED ORDER — ONDANSETRON HCL 4 MG/2ML IJ SOLN: 4.0000 mg | Freq: Once | INTRAMUSCULAR | Status: DC | PRN

## 2023-08-04 MED ORDER — OXYCODONE HCL 5 MG PO TABS
5.0000 mg | ORAL_TABLET | Freq: Once | ORAL | Status: AC | PRN
  Administered 2023-08-04: 5 mg via ORAL

## 2023-08-04 MED ORDER — OXYCODONE HCL 5 MG/5ML PO SOLN: 5.0000 mg | Freq: Once | ORAL | Status: AC | PRN

## 2023-08-04 MED ORDER — PROPOFOL 10 MG/ML IV BOLUS
INTRAVENOUS | Status: DC | PRN
  Administered 2023-08-04: 160 mg via INTRAVENOUS

## 2023-08-04 MED ORDER — BUPIVACAINE HCL (PF) 0.5 % IJ SOLN
INTRAMUSCULAR | Status: DC | PRN
  Administered 2023-08-04: 30 mL

## 2023-08-04 SURGICAL SUPPLY — 38 items
CHLORAPREP W/TINT 26 (MISCELLANEOUS) ×2 IMPLANT
COVER LIGHT HANDLE STERIS (MISCELLANEOUS) ×4 IMPLANT
COVER MAYO STAND XLG (MISCELLANEOUS) ×2 IMPLANT
COVER TIP SHEARS 8 DVNC (MISCELLANEOUS) ×2 IMPLANT
DERMABOND ADVANCED .7 DNX12 (GAUZE/BANDAGES/DRESSINGS) ×2 IMPLANT
DRAPE ARM DVNC X/XI (DISPOSABLE) ×6 IMPLANT
DRAPE COLUMN DVNC XI (DISPOSABLE) ×2 IMPLANT
DRIVER NDL MEGA SUTCUT DVNCXI (INSTRUMENTS) ×2 IMPLANT
DRIVER NDLE MEGA SUTCUT DVNCXI (INSTRUMENTS) ×1 IMPLANT
ELECT REM PT RETURN 9FT ADLT (ELECTROSURGICAL) ×1
ELECTRODE REM PT RTRN 9FT ADLT (ELECTROSURGICAL) ×2 IMPLANT
FORCEPS BPLR R/ABLATION 8 DVNC (INSTRUMENTS) ×2 IMPLANT
GLOVE BIOGEL PI IND STRL 7.0 (GLOVE) ×8 IMPLANT
GLOVE SURG SS PI 7.5 STRL IVOR (GLOVE) ×4 IMPLANT
GOWN STRL REUS W/TWL LRG LVL3 (GOWN DISPOSABLE) ×6 IMPLANT
KIT TURNOVER KIT A (KITS) ×2 IMPLANT
MESH VENTRALIGHT ST 4.5IN (Mesh General) IMPLANT
NDL HYPO 21X1.5 SAFETY (NEEDLE) ×2 IMPLANT
NDL INSUFFLATION 14GA 120MM (NEEDLE) ×2 IMPLANT
NEEDLE HYPO 21X1.5 SAFETY (NEEDLE) ×1 IMPLANT
NEEDLE INSUFFLATION 14GA 120MM (NEEDLE) ×1 IMPLANT
OBTURATOR OPTICAL STND 8 DVNC (TROCAR) ×1
OBTURATOR OPTICALSTD 8 DVNC (TROCAR) ×2 IMPLANT
PACK LAP CHOLE LZT030E (CUSTOM PROCEDURE TRAY) ×2 IMPLANT
PAD ARMBOARD 7.5X6 YLW CONV (MISCELLANEOUS) ×2 IMPLANT
PENCIL HANDSWITCHING (ELECTRODE) ×2 IMPLANT
POSITIONER HEAD 8X9X4 ADT (SOFTGOODS) ×2 IMPLANT
SCISSORS MNPLR CVD DVNC XI (INSTRUMENTS) ×2 IMPLANT
SEAL UNIV 5-12 XI (MISCELLANEOUS) ×6 IMPLANT
SET BASIN LINEN APH (SET/KITS/TRAYS/PACK) ×2 IMPLANT
SET TUBE SMOKE EVAC HIGH FLOW (TUBING) ×2 IMPLANT
SUT MNCRL AB 4-0 PS2 18 (SUTURE) ×4 IMPLANT
SUT STRATAFIX 0 PDS+ CT-2 23 (SUTURE) ×1
SUT V-LOC 90 ABS 3-0 VLT V-20 (SUTURE) ×4 IMPLANT
SUTURE STRATFX 0 PDS+ CT-2 23 (SUTURE) ×2 IMPLANT
SYR 30ML LL (SYRINGE) ×2 IMPLANT
TAPE TRANSPORE STRL 2 31045 (GAUZE/BANDAGES/DRESSINGS) ×4 IMPLANT
WATER STERILE IRR 500ML POUR (IV SOLUTION) ×2 IMPLANT

## 2023-08-04 NOTE — Anesthesia Procedure Notes (Signed)
Procedure Name: Intubation Date/Time: 08/04/2023 7:39 AM  Performed by: Shanon Payor, CRNAPre-anesthesia Checklist: Patient identified, Emergency Drugs available, Suction available, Patient being monitored and Timeout performed Patient Re-evaluated:Patient Re-evaluated prior to induction Oxygen Delivery Method: Circle system utilized Preoxygenation: Pre-oxygenation with 100% oxygen Induction Type: IV induction Ventilation: Mask ventilation without difficulty Laryngoscope Size: Mac and 3 Grade View: Grade I Tube type: Oral Tube size: 7.0 mm Number of attempts: 1 Airway Equipment and Method: Stylet Secured at: 22 cm Tube secured with: Tape Dental Injury: Teeth and Oropharynx as per pre-operative assessment

## 2023-08-04 NOTE — Anesthesia Postprocedure Evaluation (Signed)
Anesthesia Post Note  Patient: [name]  Procedure(s) Performed: XI ROBOTIC ASSISTED INCISIONAL HERNIA W/ MESH (Abdomen)  Patient location during evaluation: PACU Anesthesia Type: General Level of consciousness: awake and alert Pain management: pain level controlled Vital Signs Assessment: post-procedure vital signs reviewed and stable Respiratory status: spontaneous breathing, nonlabored ventilation, respiratory function stable and patient connected to nasal cannula oxygen Cardiovascular status: blood pressure returned to baseline and stable Postop Assessment: no apparent nausea or vomiting Anesthetic complications: no   There were no known notable events for this encounter.   Last Vitals:  Vitals:   08/04/23 1015 08/04/23 1024  BP: 106/60 123/60  Pulse: (!) 107 (!) 106  Resp: 13 18  Temp: (!) 36.3 C (!) 36.4 C  SpO2: 90% 100%    Last Pain:  Vitals:   08/04/23 1024  TempSrc: Oral  PainSc: 6                  Shaneequa Bahner L Alline Pio

## 2023-08-04 NOTE — Interval H&P Note (Signed)
History and Physical Interval Note:  08/04/2023 7:18 AM  [name]  has presented today for surgery, with the diagnosis of INCISIONAL HERNIA, 3- 10 CM.  The various methods of treatment have been discussed with the patient and family. After consideration of risks, benefits and other options for treatment, the patient has consented to  Procedure(s): XI ROBOTIC ASSISTED INCISIONAL HERNIA W/ MESH (N/A) as a surgical intervention.  The patient's history has been reviewed, patient examined, no change in status, stable for surgery.  I have reviewed the patient's chart and labs.  Questions were answered to the patient's satisfaction.     Franky Macho

## 2023-08-04 NOTE — Transfer of Care (Signed)
Immediate Anesthesia Transfer of Care Note  Patient: [name]  Procedure(s) Performed: XI ROBOTIC ASSISTED INCISIONAL HERNIA W/ MESH (Abdomen)  Patient Location: PACU  Anesthesia Type:General  Level of Consciousness: awake, alert , and oriented  Airway & Oxygen Therapy: Patient Spontanous Breathing and Patient connected to face mask oxygen  Post-op Assessment: Report given to RN, Post -op Vital signs reviewed and stable, and Patient moving all extremities X 4  Post vital signs: Reviewed and stable  Last Vitals:  Vitals Value Taken Time  BP 112/51 08/04/23 0930  Temp 36.5 C 08/04/23 0911  Pulse 105 08/04/23 0941  Resp 18 08/04/23 0941  SpO2 97 % 08/04/23 0941  Vitals shown include unfiled device data.  Last Pain:  Vitals:   08/04/23 0930  PainSc: 8       Patients Stated Pain Goal: 8 (08/04/23 0930)  Complications: No notable events documented.

## 2023-08-04 NOTE — Op Note (Signed)
Patient:  [name]  DOB:  10/26/1965  MRN:  [id number]   Preop Diagnosis: Incisional hernia  Postop Diagnosis: Same  Procedure: Robotic assisted laparoscopic incisional herniorrhaphy with mesh  Surgeon: Franky Macho, MD  Anes: General Endotracheal  Indications: Patient is a 57 year old white female who presents with an incisional hernia at the umbilical region.  The risks and benefits of the procedure including bleeding, infection, mesh use, the possibility of recurrence of the hernia were fully explained to the patient, who gave informed consent.  Procedure note: The patient was placed in the supine position.  After induction of general endotracheal anesthesia, the abdomen was prepped and draped using the usual sterile technique with ChloraPrep.  Surgical site confirmation was performed.  Incision was made in the left upper quadrant at Palmer's point.  A Veress needle was introduced into the abdominal cavity and confirmation of placement was done using the saline drop test.  The abdomen was then insufflated to 15 mmHg pressure.  An 8 mm trocar was introduced into the abdominal cavity under direct visualization without difficulty.  Additional 8 mm trocars were placed in the left flank and left lower quadrant regions.  The robot was then docked and targeted.  The ventral wall was inspected and the patient had a 3-1/2 cm periumbilical hernia.  The adipose tissue was reduced from the umbilicus.  The abdominal wall defect was closed using an 0 STRATAFIX running suture.  An 11 cm Bard circular dual mesh was then inserted and secured circumferentially to the anterior abdominal wall using a 3-0 V-Loc running suture.  The robot was then undocked and all air was evacuated from the abdominal cavity prior to removal of the trocars.  All wounds were irrigated with normal saline.  All wounds were injected with 0.5% Sensorcaine.  All incisions were closed using a 4-0 Monocryl subcuticular suture.   Dermabond was applied.  All tape and needle counts were correct at the end of the procedure.  The patient was extubated in the operating room and transferred to PACU in stable condition.  Complications: None  EBL: Minimal  Specimen: None

## 2023-08-12 ENCOUNTER — Encounter (HOSPITAL_COMMUNITY): Payer: Self-pay | Admitting: General Surgery

## 2023-08-16 ENCOUNTER — Encounter (INDEPENDENT_AMBULATORY_CARE_PROVIDER_SITE_OTHER): Payer: Self-pay | Admitting: Gastroenterology

## 2023-08-17 ENCOUNTER — Encounter: Payer: Self-pay | Admitting: General Surgery

## 2023-08-17 ENCOUNTER — Ambulatory Visit (INDEPENDENT_AMBULATORY_CARE_PROVIDER_SITE_OTHER): Payer: BC Managed Care – PPO | Admitting: General Surgery

## 2023-08-17 VITALS — BP 128/70 | HR 99 | Temp 98.1°F | Resp 14 | Ht 63.0 in | Wt 264.0 lb

## 2023-08-17 DIAGNOSIS — Z9889 Other specified postprocedural states: Secondary | ICD-10-CM | POA: Diagnosis not present

## 2023-08-17 DIAGNOSIS — Z8719 Personal history of other diseases of the digestive system: Secondary | ICD-10-CM

## 2023-08-17 NOTE — Progress Notes (Signed)
Subjective:     [name]  Patient here for follow-up, status post robotic assisted laparoscopic incisional herniorrhaphy with mesh.  She states she has been doing well.  She did have 1 episode of periumbilical pain when she was lifting her son, but that has since resolved.  She has not noticed any swelling. Objective:    BP 128/70   Pulse 99   Temp 98.1 F (36.7 C) (Oral)   Resp 14   Ht 5\' 3"  (1.6 m)   Wt 264 lb (119.7 kg)   SpO2 96%   BMI 46.77 kg/m   General:  alert, cooperative, and no distress  Abdomen is soft, incisions healing well.  No recurrence of the hernias noted.  No seromas present.     Assessment:    Doing well postoperatively.    Plan:   May gradually return to normal activity.  Follow-up here as needed.

## 2023-09-06 ENCOUNTER — Ambulatory Visit (INDEPENDENT_AMBULATORY_CARE_PROVIDER_SITE_OTHER): Payer: BC Managed Care – PPO | Admitting: Gastroenterology

## 2023-09-08 ENCOUNTER — Other Ambulatory Visit (INDEPENDENT_AMBULATORY_CARE_PROVIDER_SITE_OTHER): Payer: Self-pay | Admitting: *Deleted

## 2023-09-08 ENCOUNTER — Other Ambulatory Visit (INDEPENDENT_AMBULATORY_CARE_PROVIDER_SITE_OTHER): Payer: Self-pay | Admitting: Gastroenterology

## 2023-09-08 DIAGNOSIS — K50119 Crohn's disease of large intestine with unspecified complications: Secondary | ICD-10-CM

## 2023-09-21 ENCOUNTER — Ambulatory Visit (INDEPENDENT_AMBULATORY_CARE_PROVIDER_SITE_OTHER): Payer: BC Managed Care – PPO | Admitting: Gastroenterology

## 2023-11-01 ENCOUNTER — Encounter (INDEPENDENT_AMBULATORY_CARE_PROVIDER_SITE_OTHER): Payer: Self-pay | Admitting: Gastroenterology

## 2023-11-01 ENCOUNTER — Ambulatory Visit (INDEPENDENT_AMBULATORY_CARE_PROVIDER_SITE_OTHER): Payer: Self-pay | Admitting: Gastroenterology

## 2023-11-01 VITALS — BP 123/76 | HR 90 | Temp 97.9°F | Ht 63.0 in | Wt 272.2 lb

## 2023-11-01 DIAGNOSIS — K5010 Crohn's disease of large intestine without complications: Secondary | ICD-10-CM

## 2023-11-01 DIAGNOSIS — K219 Gastro-esophageal reflux disease without esophagitis: Secondary | ICD-10-CM | POA: Diagnosis not present

## 2023-11-01 NOTE — Progress Notes (Unsigned)
 Referring Provider: Richardean Chimera, MD Primary Care Physician:  Richardean Chimera, MD Primary GI Physician: Dr. Levon Hedger   Chief Complaint  Patient presents with   Crohn's Disease    Follow up on crohn's. Has ulcers in mouth and genital area that comes and goes.    HPI:   [name] is a 58 y.o. female with past medical history of Crohn's colitis, asthma, anxiety, pulmonary embolism and DVT due to COVID, sleep apnea, obesity, IBS, hyperlipidemia, hypertension   Patient presenting today for:  Follow up of Crohn's disease and GERD  Last seen July 2024, at that time patient reports she recently changed her diet and do more keto friendly diet, 2 to 3 days on bowel movement and Sunday she may have looser stools.  No rectal bleeding.  Having mostly formed stools about 75% of the time.  Has some left quadrant pain that is intermittent with some nausea.  Certain foods seem to make her symptoms worse.  She is seeing rheumatology for concerns for RA versus inflammatory arthritis and they are considering starting oral Humira.  Patient recommended to have fecal calprotectin, obtain labs from PCP for review, continue Lialda  2.4 g daily/6-MP 75 mg daily, continue Protonix 20 mg twice daily, start Pepcid 20 mg daily, EGD left upper quadrant pain persist, avoid NSAIDs.  Patient reached out to [place] in July 2024 reporting that her rheumatologist to go to start her on Simponi which she was advised was not currently approved to treat Crohn's disease, she was advised she would need to be remain on her 6-MP/Lialda's, nation may increase her risk of lymphoma.  She was advised to discuss this further with her rheumatologist  Patient contacted [place] there in the last stating that her rheumatologist agreed to start her on Cimzia, she was advised to stop her 6-MP the day before she started her Cimzia shot.  Fecal calprotectin in July was 50  Present: States since starting on cimzia in October she has done  well. She is on 400mg  every month currently.  She reports that regularly she has 1-2 solid stools per day with no abdominal pain, rectal bleeding. She is doing cimzia injections once per month. She notes about 1 week before she will have looser stools, maybe 3-4 per day and some abdominal pain though usually minimal. No rectal bleeding. She is still taking her mesalamine 2 tablets daily.   GERD well controlled on protonix 20mg  BID which works well for her. She is not taking famotidine at this time.   Patient reports that she has had lesions in her mouth and in her genital area for atleast 15 years. Usually just has one at a time that she can squeeze and get purulent drainage/blood out. She notes lesions are painful but resolve after she can get them drained. She has had one of these in the rectal area in the past that she had to go to the ER for I&d and have packed. She notes that she has been tested for STDs in the past as she was not sure what these were from. She notes that she read online these could be caused by her Crohn's disease. She notes she had not had one of these since she started Cimzia though did have one pop up in February that is still present, she does note that she drained the lesion but it is still present.   Dr. Deanne Coffer is her rheumatologist   History: Patient was diagnosed with Crohn's colitis  in 1999.  Since then she had been on Pentasa and 6-MP for multiple years. She had Remicade in the past but had joint issues.   Last flu shot: 2025  Last pneumonia shot: yes, <5 years ago Last Pap smear: August 2024  Last evaluation by dermatology: June 2024, in the process of finding a new derm  Last zoster vaccine: yes, had it in the past Last DEXA scan: had it in March 2023 , normal per patient COVID-19 shot: Moderna   Last Colonoscopy: 05/14/2021 - performed by Dr. Karilyn Cota - Three diminutive polyps in the transverse colon and at the hepatic flexure. Biopsied. - One 1 mm polyp in the  proximal transverse colon, removed with a cold snare. Resected and retrieved. No TI intubation performed. No active inflammation in the colon   Path: A. COLON, TRANSVERSE, HEPATIC FLEXURE, POLYPECTOMY:  - Polypoid colonic mucosa with a prominent lymphoid aggregate  - Negative for dysplasia  - Multiple step sections were examined      Filed Weights   11/01/23 1116  Weight: 272 lb 3.2 oz (123.5 kg)     Past Medical History:  Diagnosis Date   Anxiety    No medications currently   Arthritis    Asthma    Atypical mole 06/21/2015   mod/ sev left upper back tx w/s   Atypical mole 06/21/2015   mod/sev left post shoulder med tx w/s   Atypical mole 06/21/2015   mod/ sev left post shjouldeer lateral  tx w/s   Atypical mole 08/01/2015   mild right upper back no tx   Atypical mole 09/16/2021   left upper back (moderate to severe)   Bilateral carpal tunnel syndrome    COVID 04/08/2020   VDRF ARDS   04/16/20-extubated 05/04/2020   Crohn disease (HCC)    Diabetes mellitus    diet controlled   DVT (deep venous thrombosis) (HCC) 05/30/2020   GERD (gastroesophageal reflux disease)    Heart murmur    mild   High cholesterol    Hypertension    pcp  Dr Aurther Loft Reuel Boom    [place]   IBS (irritable bowel syndrome)    Obesity    Pneumonia 2011   History of   Pulmonary embolism (HCC) 05/30/2020   DVT/PE    Sleep apnea 03-2009 study   pt forgot to mention at pre admit visit.  uses c-pap brought her own  machine from home.    Trigger thumb, right thumb     Past Surgical History:  Procedure Laterality Date   CARDIAC CATHETERIZATION  05/30/2020   Removal pulmonary embolus    [facility]   CARPAL TUNNEL RELEASE Right 09/20/2019   Procedure: CARPAL TUNNEL RELEASE;  Surgeon: Bjorn Pippin, MD;  Location: [facility];  Service: Orthopedics;  Laterality: Right;   CARPAL TUNNEL RELEASE Left 11/15/2019   Procedure: CARPAL TUNNEL RELEASE, EXCISION OF GANGLION CYST;  Surgeon: Bjorn Pippin, MD;  Location: [facility];   Service: Orthopedics;  Laterality: Left;   CHOLECYSTECTOMY N/A 03/31/2013   Procedure: LAPAROSCOPIC CHOLECYSTECTOMY;  Surgeon: Dalia Heading, MD;  Location: [facility];  Service: General;  Laterality: N/A;   COLONOSCOPY N/A 03/16/2013   Procedure: COLONOSCOPY;  Surgeon: Malissa Hippo, MD;  [facility];  Service: Endoscopy;  Laterality: N/A;  255   COLONOSCOPY N/A 12/31/2014   Procedure: COLONOSCOPY;  Surgeon: Malissa Hippo, MD;  [facility];  Service: Endoscopy;  Laterality: N/A;  730   COLONOSCOPY WITH PROPOFOL N/A  05/14/2021   Procedure: COLONOSCOPY WITH PROPOFOL;  Surgeon: Malissa Hippo, MD;  [facility];  Service: Endoscopy;  Laterality: N/A;  915   CYST REMOVAL HAND     cyst removed from left hand     FLEXIBLE SIGMOIDOSCOPY N/A 08/21/2014   Procedure: FLEXIBLE SIGMOIDOSCOPY;  Surgeon: Malissa Hippo, MD;  [facility];  Service: Endoscopy;  Laterality: N/A;  1030   POLYPECTOMY  05/14/2021   Procedure: POLYPECTOMY;  Surgeon: Malissa Hippo, MD;  [facility];  Service: Endoscopy;;   TONSILLECTOMY     TOTAL HIP ARTHROPLASTY  04/18/2012   Procedure: TOTAL HIP ARTHROPLASTY;  Surgeon: Eldred Manges, MD;  Location: [facility];  Service: Orthopedics;  Laterality: Right;  Right Total Hip Arthroplasty   TOTAL SHOULDER ARTHROPLASTY Left 07/09/2021   Procedure: TOTAL SHOULDER ARTHROPLASTY;  Surgeon: Bjorn Pippin, MD;  Location: [facility];  Service: Orthopedics;  Laterality: Left;   TRIGGER FINGER RELEASE Left 07/09/2021   Procedure: RELEASE TRIGGER FINGER/A-1 PULLEY;  Surgeon: Bjorn Pippin, MD;  Location: [facility];  Service: Orthopedics;  Laterality: Left;   XI ROBOTIC ASSISTED VENTRAL HERNIA N/A 08/04/2023   Procedure: XI ROBOTIC ASSISTED INCISIONAL HERNIA W/ MESH;  Surgeon: Franky Macho, MD;  Location: [facility];  Service: General;  Laterality: N/A;    Current Outpatient Medications  Medication Sig Dispense Refill   albuterol (PROVENTIL) (2.5  MG/3ML) 0.083% nebulizer solution Inhale 2.5 mg into the lungs daily as needed for shortness of breath or wheezing.     albuterol (VENTOLIN HFA) 108 (90 Base) MCG/ACT inhaler Inhale 2 puffs into the lungs every 4 (four) hours as needed (Asthma).     busPIRone (BUSPAR) 15 MG tablet Take 15 mg by mouth 2 (two) times daily.     Calcium Carb-Cholecalciferol (CALCIUM 600/VITAMIN D3 PO) Take 1,200 mg by mouth daily.     Certolizumab Pegol (CIMZIA, 2 SYRINGE, [place]) Inject 200 mg into the skin every 30 (thirty) days. In each arm     dicyclomine (BENTYL) 20 MG tablet Take one tid prn spasms 90 tablet 5   DULoxetine (CYMBALTA) 60 MG capsule Take 1 capsule (60 mg total) by mouth daily. (Patient taking differently: Take 120 mg by mouth daily.) 5 capsule 0   fluticasone (FLONASE) 50 MCG/ACT nasal spray Place 1 spray into both nostrils daily as needed for allergies or rhinitis.     Fluticasone-Salmeterol (ADVAIR) 250-50 MCG/DOSE AEPB Inhale 1 puff into the lungs 2 (two) times daily.     JARDIANCE 25 MG TABS tablet Take 25 mg by mouth daily.      levocetirizine (XYZAL) 5 MG tablet Take 5 mg by mouth at bedtime.     MAGNESIUM OXIDE PO Take 1,000 mg by mouth at bedtime. 500 mg each     mesalamine (LIALDA) 1.2 g EC tablet TAKE 2 TABLETS BY MOUTH EVERY MORNING WITH BREAKFAST. 60 tablet 3   Multiple Vitamin (MULITIVITAMIN WITH MINERALS) TABS Take 1 tablet by mouth every morning.      olmesartan (BENICAR) 40 MG tablet Take 40 mg by mouth daily.     pantoprazole (PROTONIX) 40 MG tablet Take 40 mg by mouth 2 (two) times daily.     Probiotic Product (PROBIOTIC DAILY PO) Take 200 mg by mouth daily. culturelle probiotics     rosuvastatin (CRESTOR) 20 MG tablet Take 20 mg by mouth at bedtime.     traZODone (DESYREL) 50 MG tablet Take 1 tablet (50 mg total) by mouth  at bedtime as needed for sleep. (Patient taking differently: Take 100 mg by mouth at bedtime.) 5 tablet 0   No current facility-administered medications for this  visit.    Allergies as of 11/01/2023 - Review Complete 11/01/2023  Allergen Reaction Noted   Remicade [infliximab] Swelling 10/08/2011   Cefprozil Rash 10/08/2011   Penicillins Rash 10/08/2011    Social History   Socioeconomic History   Marital status: Married    Spouse name: Not on file   Number of children: Not on file   Years of education: Not on file   Highest education level: Not on file  Occupational History   Not on file  Tobacco Use   Smoking status: Never    Passive exposure: Past   Smokeless tobacco: Never  Vaping Use   Vaping status: Never Used  Substance and Sexual Activity   Alcohol use: No    Alcohol/week: 0.0 standard drinks of alcohol   Drug use: No   Sexual activity: Yes    Birth control/protection: Post-menopausal  Other Topics Concern   Not on file  Social History Narrative   Not on file   Social Drivers of Health   Financial Resource Strain: Low Risk  (05/19/2020)   Received from [facility], [facility]   Overall Financial Resource Strain (CARDIA)    Difficulty of Paying Living Expenses: Not hard at all  Food Insecurity: No Food Insecurity (05/19/2020)   [facility]   Hunger Vital Sign    Worried About Running Out of Food in the Last Year: Never true    Ran Out of Food in the Last Year: Never true  Transportation Needs: No Transportation Needs (05/19/2020)   [facility] from [facility], [facility]   PRAPARE - Transportation    Lack of Transportation (Medical): No    Lack of Transportation (Non-Medical): No  Physical Activity: Not on file  Stress: Not on file  Social Connections: Not on file    Review of systems General: negative for malaise, night sweats, fever, chills, weight loss Neck: Negative for lumps, goiter, pain and significant neck swelling Resp: Negative for cough, wheezing, dyspnea at rest CV: Negative for chest pain, leg swelling, palpitations, orthopnea GI: denies melena,  hematochezia, nausea, vomiting,  constipation, dysphagia, odyonophagia, early satiety or unintentional weight loss. +loose stools +abdominal pain  MSK: Negative for joint pain or swelling, back pain, and muscle pain. Derm: Negative for itching or rash +skin abscesses  The remainder of the review of systems is noncontributory.  Physical Exam: BP 123/76   Pulse 90   Temp 97.9 F (36.6 C)   Ht 5\' 3"  (1.6 m)   Wt 272 lb 3.2 oz (123.5 kg)   BMI 48.22 kg/m  General:   Alert and oriented. No distress noted. Pleasant and cooperative.  Head:  Normocephalic and atraumatic. Eyes:  Conjuctiva clear without scleral icterus. Mouth:  Oral mucosa pink and moist. Good dentition. No lesions. Heart: Normal rate and rhythm, s1 and s2 heart sounds present.  Lungs: Clear lung sounds in all lobes. Respirations equal and unlabored. Abdomen:  +BS, soft, non-tender and non-distended. No rebound or guarding. No HSM or masses noted. Derm: No palmar erythema or jaundice Neurologic:  Alert and  oriented x4 Psych:  Alert and cooperative. Normal mood and affect.  Invalid input(s): "6 MONTHS"   ASSESSMENT: [name] is a 58 y.o. female presenting today for follow up of Crohn's disease and GERD  Crohn's disease:  GERD:    PLAN:  -check CRP, fecal calprotectin -cimzia levels/Ab prior to April dose (after 5 doses) -obtain labs from PCP -continue protonix 20mg  BID -see derm regarding skin abscesses  All questions were answered, patient verbalized understanding and is in agreement with plan as outlined above.    Follow Up: 6 months   [place] Delauder L. Jeanmarie Hubert, MSN, APRN, AGNP-C Adult-Gerontology Nurse Practitioner [facility]

## 2023-11-01 NOTE — Patient Instructions (Signed)
 We will check inflammatory markers and cimzia levels. Cimzia levels need to be checked after your 5th dose, please do these the day prior to your next cimzia dose, this will allow [place] to see if cimzia levels are dropping too low leading up to your next dose We can continue with mesalamine at this time, though we may be able to transition you off of this if Cimzia seems to be controlling your symptoms Please try to get in with dermatology regarding ongoing skin abscesses  Continue protonix 20mg  twice daily  Follow up 6 months

## 2023-12-07 LAB — SERIAL MONITORING

## 2023-12-08 LAB — CERTOLIZUMAB AND ANTI-CERTO AB
Anti-Certolizumab Ab Level: 17144 ng/mL
Certolizumab DRUG Level: 6.3 ug/mL

## 2023-12-08 LAB — C-REACTIVE PROTEIN: CRP: 2 mg/L (ref 0–10)

## 2023-12-08 LAB — CALPROTECTIN, FECAL: Calprotectin, Fecal: 61 ug/g (ref 0–120)

## 2023-12-09 ENCOUNTER — Encounter (INDEPENDENT_AMBULATORY_CARE_PROVIDER_SITE_OTHER): Payer: Self-pay

## 2023-12-21 ENCOUNTER — Other Ambulatory Visit (INDEPENDENT_AMBULATORY_CARE_PROVIDER_SITE_OTHER): Payer: Self-pay | Admitting: Gastroenterology

## 2023-12-21 DIAGNOSIS — K50119 Crohn's disease of large intestine with unspecified complications: Secondary | ICD-10-CM

## 2023-12-30 ENCOUNTER — Ambulatory Visit (INDEPENDENT_AMBULATORY_CARE_PROVIDER_SITE_OTHER): Admitting: Gastroenterology

## 2023-12-30 ENCOUNTER — Encounter (INDEPENDENT_AMBULATORY_CARE_PROVIDER_SITE_OTHER): Payer: Self-pay | Admitting: Gastroenterology

## 2023-12-30 VITALS — BP 127/74 | HR 101 | Temp 98.0°F | Ht 63.0 in | Wt 276.7 lb

## 2023-12-30 DIAGNOSIS — K5010 Crohn's disease of large intestine without complications: Secondary | ICD-10-CM | POA: Diagnosis not present

## 2023-12-30 DIAGNOSIS — Z111 Encounter for screening for respiratory tuberculosis: Secondary | ICD-10-CM

## 2023-12-30 DIAGNOSIS — Z1159 Encounter for screening for other viral diseases: Secondary | ICD-10-CM

## 2023-12-30 NOTE — Progress Notes (Signed)
 [name], M.D. Gastroenterology & Hepatology [facility] Gastroenterology [address] [place], [place] [address]  Primary Care Physician: Leesa Pulling, MD [address] [place] [address]  I will communicate my assessment and recommendations to the referring MD via EMR.  Problems: Crohn's colitis Ab against Cimzia  History of Present Illness: [name] is a 58 y.o. female with past medical history of Crohn's colitis, asthma, anxiety, pulmonary embolism and DVT due to COVID, sleep apnea, obesity, IBS, hyperlipidemia, hypertension , who presents for follow up of Crohn's disease.  The patient was last seen on 11/01/2023. At that time, the patient was taking Cimzia 400 mg every 4 weeks and mesalamine .  Patient had CRP and fecal calprotectin levels checked.  Calprotectin was normal at 61, CRP was normal at 2.  Notably, her Cimzia levels were checked on 11/29/2023.  This was  adequate levels but her Cimzia antibody levels were increased at 17,144.  Patient reports that in the upper abdomen and in the L side of the abdomen she is presenting abdominal pain issues. She is having 1-2 bowel movements. These episodes have been infrequent.  The patient denies having any nausea, vomiting, fever, chills, hematochezia, melena, hematemesis, abdominal distention, abdominal pain, diarrhea, jaundice, pruritus. Has gained 20 lb since early 2025.  Patient reports that she has presented intermittent issues with abscesses with purulent discharge for the last 20 years , but they became more frequent for the last 4 months. They have intermittent discharge.  Patient follows with Dr. Larrie Po is the patient's rheumatologist.  History: Patient was diagnosed with Crohn's colitis in 1999.  Since then she had been on Pentasa  and 6-MP for multiple years. She had Remicade in the past but had joint issues.    Last flu shot: 2025  Last pneumonia shot: yes, <5 years  ago Last Pap smear: August 2024  Last evaluation by dermatology: June 2024, in the process of finding a new derm  Last zoster vaccine: yes, had it in the past Last DEXA scan: had it in March 2023 , normal per patient COVID-19 shot: Moderna   Last Colonoscopy: 05/14/2021 - performed by Dr. Homero Luster - Three diminutive polyps in the transverse colon and at the hepatic flexure. Biopsied. - One 1 mm polyp in the proximal transverse colon, removed with a cold snare. Resected and retrieved. No TI intubation performed. No active inflammation in the colon   Path: A. COLON, TRANSVERSE, HEPATIC FLEXURE, POLYPECTOMY:  - Polypoid colonic mucosa with a prominent lymphoid aggregate  - Negative for dysplasia  - Multiple step sections were examined   Past Medical History: Past Medical History:  Diagnosis Date   Anxiety    No medications currently   Arthritis    Asthma    Atypical mole 06/21/2015   mod/ sev left upper back tx w/s   Atypical mole 06/21/2015   mod/sev left post shoulder med tx w/s   Atypical mole 06/21/2015   mod/ sev left post shjouldeer lateral  tx w/s   Atypical mole 08/01/2015   mild right upper back no tx   Atypical mole 09/16/2021   left upper back (moderate to severe)   Bilateral carpal tunnel syndrome    COVID 04/08/2020   VDRF ARDS   04/16/20-extubated 05/04/2020   Crohn disease (HCC)    Diabetes mellitus    diet controlled   DVT (deep venous thrombosis) (HCC) 05/30/2020   GERD (gastroesophageal reflux disease)    Heart murmur  mild   High cholesterol    Hypertension    pcp  Dr Blaise Bumps Bearl Limes    [place]   IBS (irritable bowel syndrome)    Obesity    Pneumonia 2011   History of   Pulmonary embolism (HCC) 05/30/2020   DVT/PE    Sleep apnea 03-2009 study   pt forgot to mention at pre admit visit.  uses c-pap brought her own  machine from home.    Trigger thumb, right thumb     Past Surgical History: Past Surgical History:  Procedure Laterality Date   CARDIAC  CATHETERIZATION  05/30/2020   Removal pulmonary embolus    [facility]   CARPAL TUNNEL RELEASE Right 09/20/2019   Procedure: CARPAL TUNNEL RELEASE;  Surgeon: Micheline Ahr, MD;  Location: [facility];  Service: Orthopedics;  Laterality: Right;   CARPAL TUNNEL RELEASE Left 11/15/2019   Procedure: CARPAL TUNNEL RELEASE, EXCISION OF GANGLION CYST;  Surgeon: Micheline Ahr, MD;  Location: [facility];  Service: Orthopedics;  Laterality: Left;   CHOLECYSTECTOMY N/A 03/31/2013   Procedure: LAPAROSCOPIC CHOLECYSTECTOMY;  Surgeon: Beau Bound, MD;  Location: [facility];  Service: General;  Laterality: N/A;   COLONOSCOPY N/A 03/16/2013   Procedure: COLONOSCOPY;  Surgeon: Ruby Corporal, MD;  [facility]: [facility];  Service: Endoscopy;  Laterality: N/A;  255   COLONOSCOPY N/A 12/31/2014   Procedure: COLONOSCOPY;  Surgeon: Ruby Corporal, MD;  [facility]: [facility];  Service: Endoscopy;  Laterality: N/A;  730   COLONOSCOPY WITH PROPOFOL  N/A 05/14/2021   Procedure: COLONOSCOPY WITH PROPOFOL ;  Surgeon: Ruby Corporal, MD;  [facility]: [facility];  Service: Endoscopy;  Laterality: N/A;  915   CYST REMOVAL HAND     cyst removed from left hand     FLEXIBLE SIGMOIDOSCOPY N/A 08/21/2014   Procedure: FLEXIBLE SIGMOIDOSCOPY;  Surgeon: Ruby Corporal, MD;  [facility];  Service: Endoscopy;  Laterality: N/A;  1030   POLYPECTOMY  05/14/2021   Procedure: POLYPECTOMY;  Surgeon: Ruby Corporal, MD;  [facility];  Service: Endoscopy;;   TONSILLECTOMY     TOTAL HIP ARTHROPLASTY  04/18/2012   Procedure: TOTAL HIP ARTHROPLASTY;  Surgeon: Adah Acron, MD;  Location: [facility];  Service: Orthopedics;  Laterality: Right;  Right Total Hip Arthroplasty   TOTAL SHOULDER ARTHROPLASTY Left 07/09/2021   Procedure: TOTAL SHOULDER ARTHROPLASTY;  Surgeon: Micheline Ahr, MD;  Location: [facility];  Service: Orthopedics;  Laterality: Left;   TRIGGER FINGER RELEASE Left 07/09/2021   Procedure: RELEASE TRIGGER FINGER/A-1 PULLEY;   Surgeon: Micheline Ahr, MD;  Location: [facility];  Service: Orthopedics;  Laterality: Left;   XI ROBOTIC ASSISTED VENTRAL HERNIA N/A 08/04/2023   Procedure: XI ROBOTIC ASSISTED INCISIONAL HERNIA W/ MESH;  Surgeon: Alanda Allegra, MD;  Location: [facility];  Service: General;  Laterality: N/A;    Family History:History reviewed. No pertinent family history.  Social History: Social History   Tobacco Use  Smoking Status Never   Passive exposure: Past  Smokeless Tobacco Never   Social History   Substance and Sexual Activity  Alcohol Use No   Alcohol/week: 0.0 standard drinks of alcohol   Social History   Substance and Sexual Activity  Drug Use No    Allergies: Allergies  Allergen Reactions   Remicade [Infliximab] Swelling    Joints locked up   Cefprozil Rash    Tolerates Ancef    Penicillins Rash    Did it involve swelling of the  face/tongue/throat, SOB, or low BP? No Did it involve sudden or severe rash/hives, skin peeling, or any reaction on the inside of your mouth or nose? No Did you need to seek medical attention at a hospital or doctor's office? No When did it last happen?       If all above answers are "NO", may proceed with cephalosporin use.     Medications: Current Outpatient Medications  Medication Sig Dispense Refill   albuterol  (PROVENTIL ) (2.5 MG/3ML) 0.083% nebulizer solution Inhale 2.5 mg into the lungs daily as needed for shortness of breath or wheezing.     albuterol  (VENTOLIN  HFA) 108 (90 Base) MCG/ACT inhaler Inhale 2 puffs into the lungs every 4 (four) hours as needed (Asthma).     busPIRone (BUSPAR) 15 MG tablet Take 15 mg by mouth 2 (two) times daily.     Calcium  Carb-Cholecalciferol  (CALCIUM  600/VITAMIN D3 PO) Take 1,200 mg by mouth daily.     Certolizumab Pegol (CIMZIA, 2 SYRINGE, [place]) Inject 200 mg into the skin every 30 (thirty) days. In each arm     dicyclomine  (BENTYL ) 20 MG tablet Take one tid prn spasms 90 tablet 5   DULoxetine  (CYMBALTA ) 60 MG  capsule Take 1 capsule (60 mg total) by mouth daily. (Patient taking differently: Take 120 mg by mouth daily.) 5 capsule 0   fluticasone  (FLONASE) 50 MCG/ACT nasal spray Place 1 spray into both nostrils daily as needed for allergies or rhinitis.     Fluticasone -Salmeterol (ADVAIR ) 250-50 MCG/DOSE AEPB Inhale 1 puff into the lungs 2 (two) times daily.     JARDIANCE 25 MG TABS tablet Take 25 mg by mouth daily.      levocetirizine (XYZAL) 5 MG tablet Take 5 mg by mouth at bedtime.     MAGNESIUM OXIDE PO Take 1,000 mg by mouth at bedtime. 500 mg each     mesalamine  (LIALDA ) 1.2 g EC tablet TAKE 2 TABLETS BY MOUTH EVERY MORNING WITH BREAKFAST 60 tablet 1   Multiple Vitamin (MULITIVITAMIN WITH MINERALS) TABS Take 1 tablet by mouth every morning.      olmesartan (BENICAR) 40 MG tablet Take 40 mg by mouth daily.     pantoprazole  (PROTONIX ) 20 MG tablet Take 20 mg by mouth 2 (two) times daily.     Probiotic Product (PROBIOTIC DAILY PO) Take 200 mg by mouth daily. culturelle probiotics     rosuvastatin (CRESTOR) 20 MG tablet Take 20 mg by mouth at bedtime.     traZODone  (DESYREL ) 50 MG tablet Take 1 tablet (50 mg total) by mouth at bedtime as needed for sleep. (Patient taking differently: Take 100 mg by mouth at bedtime.) 5 tablet 0   No current facility-administered medications for this visit.    Review of Systems: GENERAL: negative for malaise, night sweats HEENT: No changes in hearing or vision, no nose bleeds or other nasal problems. NECK: Negative for lumps, goiter, pain and significant neck swelling RESPIRATORY: Negative for cough, wheezing CARDIOVASCULAR: Negative for chest pain, leg swelling, palpitations, orthopnea GI: Mcintyre HPI MUSCULOSKELETAL: Negative for joint pain or swelling, back pain, and muscle pain. SKIN: Negative for lesions, rash PSYCH: Negative for sleep disturbance, mood disorder and recent psychosocial stressors. HEMATOLOGY Negative for prolonged bleeding, bruising easily, and  swollen nodes. ENDOCRINE: Negative for cold or heat intolerance, polyuria, polydipsia and goiter. NEURO: negative for tremor, gait imbalance, syncope and seizures. The remainder of the review of systems is noncontributory.   Physical Exam: BP 127/74 (BP Location: Left Arm, Patient Position: Sitting,  Cuff Size: Normal)   Pulse (!) 101   Temp 98 F (36.7 C) (Temporal)   Ht 5\' 3"  (1.6 m)   Wt 276 lb 11.2 oz (125.5 kg)   BMI 49.02 kg/m  GENERAL: The patient is AO x3, in no acute distress. HEENT: Head is normocephalic and atraumatic. EOMI are intact. Mouth is well hydrated and without lesions. NECK: Supple. No masses LUNGS: Clear to auscultation. No presence of rhonchi/wheezing/rales. Adequate chest expansion HEART: RRR, normal s1 and s2. ABDOMEN: Soft, nontender, no guarding, no peritoneal signs, and nondistended. BS +. No masses. EXTREMITIES: Without any cyanosis, clubbing, rash, lesions or edema. NEUROLOGIC: AOx3, no focal motor deficit. SKIN: no jaundice, no rashes  Imaging/Labs: as above  I personally reviewed and interpreted the available labs, imaging and endoscopic files.  Impression and Plan: [name] is a 58 y.o. female with past medical history of Crohn's colitis, asthma, anxiety, pulmonary embolism and DVT due to COVID, sleep apnea, obesity, IBS, hyperlipidemia, hypertension , who presents for follow up of Crohn's disease.  Patient has presented chronic history of Crohn's disease which was initially managed with immunomodulators.  Unfortunately she failed to have significant improvement of her symptoms.  Notably, per discussion with her rheumatologist, the patient was started on advanced medical therapies.  An agreement was made to attempt Cimzia as a potential treatment to manage her rheumatoid arthritis and her chronic disease.  Unfortunately, her most recent blood workup shows elevated titers of antibodies against Cimzia.  Due to this, the patient has now  presented significant improvement of her symptoms and it is very likely she will develop worsening symptoms from Crohn's as distant medication may [place] be an option for management of her disease.  We discussed the possibility of switching to another agent, which I would like to discuss with her rheumatologist (Dr. Govinda Aryal).  I attempted calling him today to discuss this but could not get a hold of him, will try to discuss if it is possible to switch her to Humira versus Rinvoq for management of both conditions.  For now she will continue with Cimzia and Pentasa  as prior.  -Continue Cimzia every 4 weeks for now -Continue Pentasa  2.4 g for now -Will discuss with rheumatologist possibility of switching you to Humira versus Rinvoq for management of Crohn's disease and rheumatoid arthritis -Check lipid profile, TB/hep B screening  All questions were answered.      Cheryl Cress, MD Gastroenterology and Hepatology [facility]

## 2023-12-30 NOTE — Patient Instructions (Signed)
 Continue Cimzia every 4 weeks for now Continue Pentasa  2.4 g for now Will discuss with rheumatologist possibility of switching you to Humira versus Rinvoq for management of Crohn's disease and rheumatoid arthritis Perform blood workup

## 2023-12-31 ENCOUNTER — Telehealth (INDEPENDENT_AMBULATORY_CARE_PROVIDER_SITE_OTHER): Payer: Self-pay | Admitting: Gastroenterology

## 2023-12-31 NOTE — Telephone Encounter (Signed)
 I discussed with Dr.  Govinda Aryal (rheumatologist) the options for management of Crohn's and rheumatoid arthritis.  We agreed that we will give her a trial with adalimumab for now.  I discussed with the patient this and she is in agreement to proceed with this.  She will need to wait 2 weeks from her last dose of Cimzia to start receiving the induction and maintenance dose of adalimumab.  Crystal, please send her an order for adalimumab induction and maintenance dose for Crohn's disease.  Please send Humira and we will find out if her insurance covers this or if she needs to be on a biosimilar.

## 2024-01-03 ENCOUNTER — Other Ambulatory Visit (INDEPENDENT_AMBULATORY_CARE_PROVIDER_SITE_OTHER): Payer: Self-pay

## 2024-01-03 ENCOUNTER — Other Ambulatory Visit (INDEPENDENT_AMBULATORY_CARE_PROVIDER_SITE_OTHER): Payer: Self-pay | Admitting: Gastroenterology

## 2024-01-03 DIAGNOSIS — K5010 Crohn's disease of large intestine without complications: Secondary | ICD-10-CM

## 2024-01-03 MED ORDER — ADALIMUMAB-ADAZ 40 MG/0.4ML ~~LOC~~ SOAJ: 1.0000 | SUBCUTANEOUS | 11 refills | Status: DC

## 2024-01-03 MED ORDER — ADALIMUMAB-ADAZ 80 MG/0.8ML ~~LOC~~ SOAJ
1.0000 | SUBCUTANEOUS | 0 refills | Status: AC
Start: 2024-01-03 — End: 2024-01-04

## 2024-01-03 NOTE — Telephone Encounter (Signed)
 Medication sent to specialty pharmacy today.

## 2024-01-03 NOTE — Telephone Encounter (Signed)
 Would you mind sending this in? It looks like the patient insurance preferred is Adalimumab Adaz, and needs to be sent to [facility].

## 2024-01-04 ENCOUNTER — Encounter (INDEPENDENT_AMBULATORY_CARE_PROVIDER_SITE_OTHER): Payer: Self-pay

## 2024-01-04 LAB — QUANTIFERON-TB GOLD PLUS
QuantiFERON Nil Value: 0.03 [IU]/mL
QuantiFERON TB1 Ag Value: 0.07 [IU]/mL
QuantiFERON TB2 Ag Value: 0.09 [IU]/mL

## 2024-01-04 LAB — LIPID PANEL
Chol/HDL Ratio: 4.1 ratio (ref 0.0–4.4)
Cholesterol, Total: 210 mg/dL — ABNORMAL HIGH (ref 100–199)
HDL: 51 mg/dL (ref >39)
LDL Chol Calc (NIH): 132 mg/dL — ABNORMAL HIGH (ref 0–99)
Triglycerides: 153 mg/dL — ABNORMAL HIGH (ref 0–149)
VLDL Cholesterol Cal: 27 mg/dL (ref 5–40)

## 2024-01-04 LAB — HEPATITIS B SURFACE ANTIGEN: Hepatitis B Surface Ag: NEGATIVE

## 2024-01-11 ENCOUNTER — Encounter (INDEPENDENT_AMBULATORY_CARE_PROVIDER_SITE_OTHER): Payer: Self-pay | Admitting: Gastroenterology

## 2024-01-20 ENCOUNTER — Encounter (INDEPENDENT_AMBULATORY_CARE_PROVIDER_SITE_OTHER): Payer: Self-pay

## 2024-01-20 NOTE — Telephone Encounter (Signed)
 I spoke with the patient and let her know in detail of all of the below, and that she could reference this note below. I also asked that she please make sure they have sent her the correct medication of 40 mg/ml and not the 80 mg/ml,before she injects to make sure she has gotten the right dose. Patient states understanding.

## 2024-02-23 ENCOUNTER — Other Ambulatory Visit (INDEPENDENT_AMBULATORY_CARE_PROVIDER_SITE_OTHER): Payer: Self-pay | Admitting: Gastroenterology

## 2024-02-23 DIAGNOSIS — K50119 Crohn's disease of large intestine with unspecified complications: Secondary | ICD-10-CM

## 2024-03-21 ENCOUNTER — Encounter (INDEPENDENT_AMBULATORY_CARE_PROVIDER_SITE_OTHER): Payer: Self-pay | Admitting: Gastroenterology

## 2024-04-11 ENCOUNTER — Encounter (INDEPENDENT_AMBULATORY_CARE_PROVIDER_SITE_OTHER): Payer: Self-pay | Admitting: Gastroenterology

## 2024-04-12 ENCOUNTER — Encounter (INDEPENDENT_AMBULATORY_CARE_PROVIDER_SITE_OTHER): Payer: Self-pay | Admitting: *Deleted

## 2024-04-27 ENCOUNTER — Other Ambulatory Visit: Payer: Self-pay | Admitting: Gastroenterology

## 2024-04-27 ENCOUNTER — Telehealth (INDEPENDENT_AMBULATORY_CARE_PROVIDER_SITE_OTHER): Payer: Self-pay | Admitting: Gastroenterology

## 2024-04-27 DIAGNOSIS — K50119 Crohn's disease of large intestine with unspecified complications: Secondary | ICD-10-CM

## 2024-04-27 MED ORDER — MESALAMINE 1.2 G PO TBEC: DELAYED_RELEASE_TABLET | ORAL | 1 refills | Status: AC

## 2024-04-27 NOTE — Telephone Encounter (Signed)
 Medication refilled. Thanks

## 2024-04-27 NOTE — Telephone Encounter (Signed)
 Fax received from [facility] requesting refill on Mesalamine  Dr 1.2 mg tablet (substituted for Lialda ) Qty #60 Last filled 04/07/24 Take 2 tablets by mouth every morning with breakfast.   Please advise. Thank you!!

## 2024-06-14 ENCOUNTER — Encounter (INDEPENDENT_AMBULATORY_CARE_PROVIDER_SITE_OTHER): Payer: Self-pay | Admitting: Gastroenterology

## 2024-07-11 ENCOUNTER — Telehealth: Payer: Self-pay

## 2024-07-11 NOTE — Telephone Encounter (Signed)
 Who is your primary care physician: Dr.terry Toribio  Reasons for the colonoscopy: Hx polyps  Have you had a colonoscopy before?  yes  Do you have family history of colon cancer? no  Previous colonoscopy with polyps removed? yes  Do you have a history colorectal cancer?   no  Are you diabetic? If yes, Type 1 or Type 2?    Yes type 2  Do you have a prosthetic or mechanical heart valve? no  Do you have a pacemaker/defibrillator?   no  Have you had endocarditis/atrial fibrillation? no  Have you had joint replacement within the last 12 months?  no  Do you tend to be constipated or have to use laxatives? no  Do you have any history of drugs or alchohol?  no  Do you use supplemental oxygen?  no  Have you had a stroke or heart attack within the last 6 months? no  Do you take weight loss medication?  no  For female patients: have you had a hysterectomy?  no                                     are you post menopausal?       yes                                            do you still have your menstrual cycle? no      Do you take any blood-thinning medications such as: (aspirin , warfarin, Plavix, Aggrenox)  no  If yes we need the name, milligram, dosage and who is prescribing doctor  Current Outpatient Medications on File Prior to Visit  Medication Sig Dispense Refill   ASHWAGANDHA PO Take by mouth.     busPIRone (BUSPAR) 15 MG tablet Take 15 mg by mouth 2 (two) times daily.     Calcium  Carb-Cholecalciferol  (CALCIUM  600/VITAMIN D3 PO) Take 1,200 mg by mouth daily.     dicyclomine  (BENTYL ) 20 MG tablet Take one tid prn spasms 90 tablet 5   DULoxetine  (CYMBALTA ) 60 MG capsule Take 1 capsule (60 mg total) by mouth daily. 5 capsule 0   JARDIANCE 25 MG TABS tablet Take 25 mg by mouth daily.      leflunomide (ARAVA) 20 MG tablet Take 20 mg by mouth daily.     levocetirizine (XYZAL) 5 MG tablet Take 5 mg by mouth at bedtime.     MAGNESIUM OXIDE PO Take 1,000 mg by mouth at bedtime. 500  mg each     mesalamine  (LIALDA ) 1.2 g EC tablet TAKE 2 TABLETS BY MOUTH EVERY MORNING WITH BREAKFAST. 180 tablet 1   Multiple Vitamin (MULITIVITAMIN WITH MINERALS) TABS Take 1 tablet by mouth every morning.      olmesartan (BENICAR) 40 MG tablet Take 40 mg by mouth daily.     pantoprazole  (PROTONIX ) 20 MG tablet Take 20 mg by mouth 2 (two) times daily.     Probiotic Product (PROBIOTIC DAILY PO) Take 200 mg by mouth daily. culturelle probiotics     rosuvastatin (CRESTOR) 20 MG tablet Take 20 mg by mouth at bedtime.     traZODone  (DESYREL ) 50 MG tablet Take 50 mg by mouth at bedtime.     TURMERIC CURCUMIN PO Take by mouth.     No current facility-administered medications  on file prior to visit.    Allergies  Allergen Reactions   Remicade [Infliximab] Swelling    Joints locked up   Cefprozil Rash    Tolerates Ancef    Penicillins Rash and Dermatitis    Did it involve swelling of the face/tongue/throat, SOB, or low BP? No  Did it involve sudden or severe rash/hives, skin peeling, or any reaction on the inside of your mouth or nose? No  Did you need to seek medical attention at a hospital or doctor's office? No  When did it last happen?        If all above answers are "NO", may proceed with cephalosporin use.  Product containing penicillin (product)     Pharmacy: [facility]  Primary Insurance Name: [facility] [place], [place] [id number]   Best number where you can be reached: [phone]

## 2024-07-17 NOTE — Telephone Encounter (Signed)
 Room 3, Jardiance per protocol Thanks

## 2024-07-18 MED ORDER — PEG 3350-KCL-NA BICARB-NACL 420 G PO SOLR: 4000.0000 mL | Freq: Once | ORAL | 0 refills | Status: AC

## 2024-07-18 NOTE — Addendum Note (Signed)
 Addended by: JEANELL GRAEME RAMAN on: 07/18/2024 09:06 AM   Modules accepted: Orders

## 2024-07-18 NOTE — Telephone Encounter (Signed)
 Questionnaire from recall, no referral needed

## 2024-07-18 NOTE — Telephone Encounter (Signed)
 Spoke with pt and scheduled for 12/9. Aware will mail instructions and send to her mychart. Rx for prep to be sent to her pharmacy. Aware to stop jardiance 3 days prior.

## 2024-07-31 ENCOUNTER — Encounter (INDEPENDENT_AMBULATORY_CARE_PROVIDER_SITE_OTHER): Payer: Self-pay | Admitting: Gastroenterology

## 2024-07-31 ENCOUNTER — Ambulatory Visit (INDEPENDENT_AMBULATORY_CARE_PROVIDER_SITE_OTHER): Admitting: Gastroenterology

## 2024-07-31 VITALS — BP 136/68 | HR 109 | Temp 98.6°F | Ht 63.0 in | Wt 280.0 lb

## 2024-07-31 DIAGNOSIS — K5010 Crohn's disease of large intestine without complications: Secondary | ICD-10-CM

## 2024-07-31 DIAGNOSIS — Z79620 Long term (current) use of immunosuppressive biologic: Secondary | ICD-10-CM | POA: Diagnosis not present

## 2024-07-31 DIAGNOSIS — M069 Rheumatoid arthritis, unspecified: Secondary | ICD-10-CM

## 2024-07-31 NOTE — H&P (View-Only) (Signed)
 Toribio Fortune, M.D. Gastroenterology & Hepatology [facility] Gastroenterology [address] [place], [place] [address]  Primary Care Physician: Toribio Jerel MATSU, MD [address] [place] [address]  I will communicate my assessment and recommendations to the referring MD via EMR.  Problems: Crohn's colitis Ab against Cimzia   History of Present Illness: HIRAL LUKASIEWICZ is a 58 y.o. female with past medical history of Crohn's colitis, asthma, rheumatoid arthritis, anxiety, pulmonary embolism and DVT due to COVID, sleep apnea, obesity, IBS, hyperlipidemia, hypertension , who presents for follow up of Crohn's disease.  The patient was last seen on 12/30/2023.  I close with her rheumatologist possibly of switching her to Humira  every 2 weeks given the presence of Cimzia antibodies.  Patient was started on Humira  in early June. Her rheumatologist increased her medication to every week to manage her RA. She stated that she had a 12 week break while this was approved, but was restarted on Humira  4 weeks ago.  States since using the medication weekly, she has felt relief of her symptoms. She is having 1-2 formed stools.  The patient denies having any nausea, vomiting, fever, chills, hematochezia, melena, hematemesis, abdominal distention, abdominal pain, diarrhea, jaundice, pruritus or weight loss. States joint pain significantly improved with weekly dosing.  Patient is scheduled to have repeat colonoscopy on 08/08/2024.  History: Patient was diagnosed with Crohn's colitis in 1999.  Since then she had been on Pentasa  and 6-MP for multiple years. She had Remicade in the past but had joint issues.   Last flu shot: 2025  Last pneumonia shot: yes, <5 years ago Last Pap smear: August 2024  Last evaluation by dermatology: June 2024, in the process of finding a new derm  Last zoster vaccine: yes, had it in the past Last DEXA scan: had it in March 2023 , normal per  patient COVID-19 shot: Moderna Last TB/Hept B testing - 12/31/2023   Last Colonoscopy: 05/14/2021 - performed by Dr. Golda - Three diminutive polyps in the transverse colon and at the hepatic flexure. Biopsied. - One 1 mm polyp in the proximal transverse colon, removed with a cold snare. Resected and retrieved. No TI intubation performed. No active inflammation in the colon   Path: A. COLON, TRANSVERSE, HEPATIC FLEXURE, POLYPECTOMY:  - Polypoid colonic mucosa with a prominent lymphoid aggregate  - Negative for dysplasia  - Multiple step sections were examined   Past Medical History: Past Medical History:  Diagnosis Date   Anxiety    No medications currently   Arthritis    Asthma    Atypical mole 06/21/2015   mod/ sev left upper back tx w/s   Atypical mole 06/21/2015   mod/sev left post shoulder med tx w/s   Atypical mole 06/21/2015   mod/ sev left post shjouldeer lateral  tx w/s   Atypical mole 08/01/2015   mild right upper back no tx   Atypical mole 09/16/2021   left upper back (moderate to severe)   Bilateral carpal tunnel syndrome    COVID 04/08/2020   VDRF ARDS   04/16/20-extubated 05/04/2020   Crohn disease (HCC)    Diabetes mellitus    diet controlled   DVT (deep venous thrombosis) (HCC) 05/30/2020   GERD (gastroesophageal reflux disease)    Heart murmur    mild   High cholesterol    Hypertension    pcp  Dr jerel toribio    [place]   IBS (irritable bowel syndrome)  Obesity    Pneumonia 2011   History of   Pulmonary embolism (HCC) 05/30/2020   DVT/PE    Sleep apnea 03-2009 study   pt forgot to mention at pre admit visit.  uses c-pap brought her own  machine from home.    Trigger thumb, right thumb     Past Surgical History: Past Surgical History:  Procedure Laterality Date   CARDIAC CATHETERIZATION  05/30/2020   Removal pulmonary embolus    [facility]   CARPAL TUNNEL RELEASE Right 09/20/2019   Procedure: CARPAL TUNNEL RELEASE;  Surgeon: Cristy Bonner DASEN, MD;   Location: [facility];  Service: Orthopedics;  Laterality: Right;   CARPAL TUNNEL RELEASE Left 11/15/2019   Procedure: CARPAL TUNNEL RELEASE, EXCISION OF GANGLION CYST;  Surgeon: Cristy Bonner DASEN, MD;  [facility]: [facility];  Service: Orthopedics;  Laterality: Left;   CHOLECYSTECTOMY N/A 03/31/2013   Procedure: LAPAROSCOPIC CHOLECYSTECTOMY;  Surgeon: Oneil DELENA Budge, MD;  Location: [facility];  Service: General;  Laterality: N/A;   COLONOSCOPY N/A 03/16/2013   Procedure: COLONOSCOPY;  Surgeon: Claudis RAYMOND Rivet, MD;  [facility];  Service: Endoscopy;  Laterality: N/A;  255   COLONOSCOPY N/A 12/31/2014   Procedure: COLONOSCOPY;  Surgeon: Claudis RAYMOND Rivet, MD;  [facility];  Service: Endoscopy;  Laterality: N/A;  730   COLONOSCOPY WITH PROPOFOL  N/A 05/14/2021   Procedure: COLONOSCOPY WITH PROPOFOL ;  Surgeon: Rivet Claudis RAYMOND, MD;  [facility];  Service: Endoscopy;  Laterality: N/A;  915   CYST REMOVAL HAND     cyst removed from left hand     FLEXIBLE SIGMOIDOSCOPY N/A 08/21/2014   Procedure: FLEXIBLE SIGMOIDOSCOPY;  Surgeon: Claudis RAYMOND Rivet, MD;  [facility];  Service: Endoscopy;  Laterality: N/A;  1030   POLYPECTOMY  05/14/2021   Procedure: POLYPECTOMY;  Surgeon: Rivet Claudis RAYMOND, MD;  [facility];  Service: Endoscopy;;   TONSILLECTOMY     TOTAL HIP ARTHROPLASTY  04/18/2012   Procedure: TOTAL HIP ARTHROPLASTY;  Surgeon: Oneil JAYSON Herald, MD;  Location: [facility];  Service: Orthopedics;  Laterality: Right;  Right Total Hip Arthroplasty   TOTAL SHOULDER ARTHROPLASTY Left 07/09/2021   Procedure: TOTAL SHOULDER ARTHROPLASTY;  Surgeon: Cristy Bonner DASEN, MD;  [facility];  Service: Orthopedics;  Laterality: Left;   TRIGGER FINGER RELEASE Left 07/09/2021   Procedure: RELEASE TRIGGER FINGER/A-1 PULLEY;  Surgeon: Cristy Bonner DASEN, MD;  [facility];  Service: Orthopedics;  Laterality: Left;   XI ROBOTIC ASSISTED VENTRAL HERNIA N/A 08/04/2023   Procedure: XI ROBOTIC  ASSISTED INCISIONAL HERNIA W/ MESH;  Surgeon: Budge Oneil, MD;  Location: [facility];  Service: General;  Laterality: N/A;    Family History:History reviewed. No pertinent family history.  Social History: Social History   Tobacco Use  Smoking Status Never   Passive exposure: Past  Smokeless Tobacco Never   Social History   Substance and Sexual Activity  Alcohol Use No   Alcohol/week: 0.0 standard drinks of alcohol   Social History   Substance and Sexual Activity  Drug Use No    Allergies: Allergies  Allergen Reactions   Remicade [Infliximab] Swelling    Joints locked up   Cefprozil Rash    Tolerates Ancef    Penicillins Rash and Dermatitis    Did it involve swelling of the face/tongue/throat, SOB, or low BP? No  Did it involve sudden or severe rash/hives, skin peeling, or any reaction on the inside of your mouth or nose? No  Did you need to seek medical attention at a hospital or doctor's office? No  When did it last happen?        If all above answers are "NO", may proceed with cephalosporin use.  Product containing penicillin (product)    Medications: Current Outpatient Medications  Medication Sig Dispense Refill   ALBUTEROL  IN Inhale into the lungs as needed.     ASHWAGANDHA PO Take by mouth.     busPIRone (BUSPAR) 15 MG tablet Take 15 mg by mouth 2 (two) times daily.     Calcium  Carb-Cholecalciferol  (CALCIUM  600/VITAMIN D3 PO) Take 1,200 mg by mouth daily.     dicyclomine  (BENTYL ) 20 MG tablet Take one tid prn spasms 90 tablet 5   DULoxetine  (CYMBALTA ) 60 MG capsule Take 1 capsule (60 mg total) by mouth daily. 5 capsule 0   fluticasone -salmeterol (ADVAIR  DISKUS) 250-50 MCG/ACT AEPB Inhale 1 puff into the lungs in the morning and at bedtime.     JARDIANCE 25 MG TABS tablet Take 25 mg by mouth daily.      leflunomide (ARAVA) 20 MG tablet Take 20 mg by mouth daily.     levocetirizine (XYZAL) 5 MG tablet Take 5 mg by mouth at bedtime.     MAGNESIUM OXIDE PO  Take 1,000 mg by mouth at bedtime. 500 mg each     mesalamine  (LIALDA ) 1.2 g EC tablet TAKE 2 TABLETS BY MOUTH EVERY MORNING WITH BREAKFAST. 180 tablet 1   Multiple Vitamin (MULITIVITAMIN WITH MINERALS) TABS Take 1 tablet by mouth every morning.      olmesartan (BENICAR) 40 MG tablet Take 40 mg by mouth daily.     pantoprazole  (PROTONIX ) 20 MG tablet Take 20 mg by mouth 2 (two) times daily.     Probiotic Product (PROBIOTIC DAILY PO) Take 200 mg by mouth daily. culturelle probiotics     rosuvastatin (CRESTOR) 20 MG tablet Take 20 mg by mouth at bedtime.     traZODone  (DESYREL ) 50 MG tablet Take 50 mg by mouth at bedtime.     TURMERIC CURCUMIN PO Take by mouth. (Patient not taking: Reported on 07/31/2024)     No current facility-administered medications for this visit.    Review of Systems: GENERAL: negative for malaise, night sweats HEENT: No changes in hearing or vision, no nose bleeds or other nasal problems. NECK: Negative for lumps, goiter, pain and significant neck swelling RESPIRATORY: Negative for cough, wheezing CARDIOVASCULAR: Negative for chest pain, leg swelling, palpitations, orthopnea GI: SEE HPI MUSCULOSKELETAL: Negative for joint pain or swelling, back pain, and muscle pain. SKIN: Negative for lesions, rash PSYCH: Negative for sleep disturbance, mood disorder and recent psychosocial stressors. HEMATOLOGY Negative for prolonged bleeding, bruising easily, and swollen nodes. ENDOCRINE: Negative for cold or heat intolerance, polyuria, polydipsia and goiter. NEURO: negative for tremor, gait imbalance, syncope and seizures. The remainder of the review of systems is noncontributory.   Physical Exam: BP 136/68   Pulse (!) 109   Temp 98.6 F (37 C)   Ht 5' 3 (1.6 m)   Wt 280 lb (127 kg)   BMI 49.60 kg/m  GENERAL: The patient is AO x3, in no acute distress. HEENT: Head is normocephalic and atraumatic. EOMI are intact. Mouth is well hydrated and without lesions. NECK:  Supple. No masses LUNGS: Clear to auscultation. No presence of rhonchi/wheezing/rales. Adequate chest expansion HEART: RRR, normal s1 and s2. ABDOMEN: Soft, nontender, no guarding, no peritoneal signs, and nondistended. BS +. No masses. RECTAL EXAM: no external  lesions, normal tone, no masses, brown stool without blood.*** Chaperone: EXTREMITIES: Without any cyanosis, clubbing, rash, lesions or edema. NEUROLOGIC: AOx3, no focal motor deficit. SKIN: no jaundice, no rashes  Imaging/Labs: as above  I personally reviewed and interpreted the available labs, imaging and endoscopic files.  Impression and Plan: GEANA WALTS is a 58 y.o. female coming for follow up of ***   All questions were answered.      Toribio Fortune, MD Gastroenterology and Hepatology [facility] Gastroenterology

## 2024-07-31 NOTE — Progress Notes (Unsigned)
 Toribio Fortune, M.D. Gastroenterology & Hepatology [facility] Gastroenterology [address] [place], [place] [address]  Primary Care Physician: Toribio Jerel MATSU, MD [address] [place] [address]  I will communicate my assessment and recommendations to the referring MD via EMR.  Problems: Crohn's colitis Ab against Cimzia   History of Present Illness: HIRAL LUKASIEWICZ is a 58 y.o. female with past medical history of Crohn's colitis, asthma, rheumatoid arthritis, anxiety, pulmonary embolism and DVT due to COVID, sleep apnea, obesity, IBS, hyperlipidemia, hypertension , who presents for follow up of Crohn's disease.  The patient was last seen on 12/30/2023.  I close with her rheumatologist possibly of switching her to Humira  every 2 weeks given the presence of Cimzia antibodies.  Patient was started on Humira  in early June. Her rheumatologist increased her medication to every week to manage her RA. She stated that she had a 12 week break while this was approved, but was restarted on Humira  4 weeks ago.  States since using the medication weekly, she has felt relief of her symptoms. She is having 1-2 formed stools.  The patient denies having any nausea, vomiting, fever, chills, hematochezia, melena, hematemesis, abdominal distention, abdominal pain, diarrhea, jaundice, pruritus or weight loss. States joint pain significantly improved with weekly dosing.  Patient is scheduled to have repeat colonoscopy on 08/08/2024.  History: Patient was diagnosed with Crohn's colitis in 1999.  Since then she had been on Pentasa  and 6-MP for multiple years. She had Remicade in the past but had joint issues.   Last flu shot: 2025  Last pneumonia shot: yes, <5 years ago Last Pap smear: August 2024  Last evaluation by dermatology: June 2024, in the process of finding a new derm  Last zoster vaccine: yes, had it in the past Last DEXA scan: had it in March 2023 , normal per  patient COVID-19 shot: Moderna Last TB/Hept B testing - 12/31/2023   Last Colonoscopy: 05/14/2021 - performed by Dr. Golda - Three diminutive polyps in the transverse colon and at the hepatic flexure. Biopsied. - One 1 mm polyp in the proximal transverse colon, removed with a cold snare. Resected and retrieved. No TI intubation performed. No active inflammation in the colon   Path: A. COLON, TRANSVERSE, HEPATIC FLEXURE, POLYPECTOMY:  - Polypoid colonic mucosa with a prominent lymphoid aggregate  - Negative for dysplasia  - Multiple step sections were examined   Past Medical History: Past Medical History:  Diagnosis Date   Anxiety    No medications currently   Arthritis    Asthma    Atypical mole 06/21/2015   mod/ sev left upper back tx w/s   Atypical mole 06/21/2015   mod/sev left post shoulder med tx w/s   Atypical mole 06/21/2015   mod/ sev left post shjouldeer lateral  tx w/s   Atypical mole 08/01/2015   mild right upper back no tx   Atypical mole 09/16/2021   left upper back (moderate to severe)   Bilateral carpal tunnel syndrome    COVID 04/08/2020   VDRF ARDS   04/16/20-extubated 05/04/2020   Crohn disease (HCC)    Diabetes mellitus    diet controlled   DVT (deep venous thrombosis) (HCC) 05/30/2020   GERD (gastroesophageal reflux disease)    Heart murmur    mild   High cholesterol    Hypertension    pcp  Dr jerel toribio    [place]   IBS (irritable bowel syndrome)  Obesity    Pneumonia 2011   History of   Pulmonary embolism (HCC) 05/30/2020   DVT/PE    Sleep apnea 03-2009 study   pt forgot to mention at pre admit visit.  uses c-pap brought her own  machine from home.    Trigger thumb, right thumb     Past Surgical History: Past Surgical History:  Procedure Laterality Date   CARDIAC CATHETERIZATION  05/30/2020   Removal pulmonary embolus    [facility]   CARPAL TUNNEL RELEASE Right 09/20/2019   Procedure: CARPAL TUNNEL RELEASE;  Surgeon: Cristy Bonner DASEN, MD;   [facility];  Service: Orthopedics;  Laterality: Right;   CARPAL TUNNEL RELEASE Left 11/15/2019   Procedure: CARPAL TUNNEL RELEASE, EXCISION OF GANGLION CYST;  Surgeon: Cristy Bonner DASEN, MD;  [facility];  Service: Orthopedics;  Laterality: Left;   CHOLECYSTECTOMY N/A 03/31/2013   Procedure: LAPAROSCOPIC CHOLECYSTECTOMY;  Surgeon: Oneil DELENA Budge, MD;  [facility]: [facility];  Service: General;  Laterality: N/A;   COLONOSCOPY N/A 03/16/2013   Procedure: COLONOSCOPY;  Surgeon: Claudis RAYMOND Rivet, MD;  [facility];  Service: Endoscopy;  Laterality: N/A;  255   COLONOSCOPY N/A 12/31/2014   Procedure: COLONOSCOPY;  Surgeon: Claudis RAYMOND Rivet, MD;  [facility];  Service: Endoscopy;  Laterality: N/A;  730   COLONOSCOPY WITH PROPOFOL  N/A 05/14/2021   Procedure: COLONOSCOPY WITH PROPOFOL ;  Surgeon: Rivet Claudis RAYMOND, MD;  [facility];  Service: Endoscopy;  Laterality: N/A;  915   CYST REMOVAL HAND     cyst removed from left hand     FLEXIBLE SIGMOIDOSCOPY N/A 08/21/2014   Procedure: FLEXIBLE SIGMOIDOSCOPY;  Surgeon: Claudis RAYMOND Rivet, MD;  [facility];  Service: Endoscopy;  Laterality: N/A;  1030   POLYPECTOMY  05/14/2021   Procedure: POLYPECTOMY;  Surgeon: Rivet Claudis RAYMOND, MD;  [facility];  Service: Endoscopy;;   TONSILLECTOMY     TOTAL HIP ARTHROPLASTY  04/18/2012   Procedure: TOTAL HIP ARTHROPLASTY;  Surgeon: Oneil JAYSON Herald, MD;  Location: [facility];  Service: Orthopedics;  Laterality: Right;  Right Total Hip Arthroplasty   TOTAL SHOULDER ARTHROPLASTY Left 07/09/2021   Procedure: TOTAL SHOULDER ARTHROPLASTY;  Surgeon: Cristy Bonner DASEN, MD;  [facility];  Service: Orthopedics;  Laterality: Left;   TRIGGER FINGER RELEASE Left 07/09/2021   Procedure: RELEASE TRIGGER FINGER/A-1 PULLEY;  Surgeon: Cristy Bonner DASEN, MD;  [facility];  Service: Orthopedics;  Laterality: Left;   XI ROBOTIC ASSISTED VENTRAL HERNIA N/A 08/04/2023   Procedure: XI ROBOTIC  ASSISTED INCISIONAL HERNIA W/ MESH;  Surgeon: Budge Oneil, MD;  [facility];  Service: General;  Laterality: N/A;    Family History:History reviewed. No pertinent family history.  Social History: Social History   Tobacco Use  Smoking Status Never   Passive exposure: Past  Smokeless Tobacco Never   Social History   Substance and Sexual Activity  Alcohol Use No   Alcohol/week: 0.0 standard drinks of alcohol   Social History   Substance and Sexual Activity  Drug Use No    Allergies: Allergies  Allergen Reactions   Remicade [Infliximab] Swelling    Joints locked up   Cefprozil Rash    Tolerates Ancef    Penicillins Rash and Dermatitis    Did it involve swelling of the face/tongue/throat, SOB, or low BP? No  Did it involve sudden or severe rash/hives, skin peeling, or any reaction on the inside of your mouth or nose? No  Did you need to seek medical attention at a hospital or doctor's office? No  When did it last happen?        If all above answers are "NO", may proceed with cephalosporin use.  Product containing penicillin (product)    Medications: Current Outpatient Medications  Medication Sig Dispense Refill   ALBUTEROL  IN Inhale into the lungs as needed.     ASHWAGANDHA PO Take by mouth.     busPIRone (BUSPAR) 15 MG tablet Take 15 mg by mouth 2 (two) times daily.     Calcium  Carb-Cholecalciferol  (CALCIUM  600/VITAMIN D3 PO) Take 1,200 mg by mouth daily.     dicyclomine  (BENTYL ) 20 MG tablet Take one tid prn spasms 90 tablet 5   DULoxetine  (CYMBALTA ) 60 MG capsule Take 1 capsule (60 mg total) by mouth daily. 5 capsule 0   fluticasone -salmeterol (ADVAIR  DISKUS) 250-50 MCG/ACT AEPB Inhale 1 puff into the lungs in the morning and at bedtime.     JARDIANCE 25 MG TABS tablet Take 25 mg by mouth daily.      leflunomide (ARAVA) 20 MG tablet Take 20 mg by mouth daily.     levocetirizine (XYZAL) 5 MG tablet Take 5 mg by mouth at bedtime.     MAGNESIUM OXIDE PO  Take 1,000 mg by mouth at bedtime. 500 mg each     mesalamine  (LIALDA ) 1.2 g EC tablet TAKE 2 TABLETS BY MOUTH EVERY MORNING WITH BREAKFAST. 180 tablet 1   Multiple Vitamin (MULITIVITAMIN WITH MINERALS) TABS Take 1 tablet by mouth every morning.      olmesartan (BENICAR) 40 MG tablet Take 40 mg by mouth daily.     pantoprazole  (PROTONIX ) 20 MG tablet Take 20 mg by mouth 2 (two) times daily.     Probiotic Product (PROBIOTIC DAILY PO) Take 200 mg by mouth daily. culturelle probiotics     rosuvastatin (CRESTOR) 20 MG tablet Take 20 mg by mouth at bedtime.     traZODone  (DESYREL ) 50 MG tablet Take 50 mg by mouth at bedtime.     TURMERIC CURCUMIN PO Take by mouth. (Patient not taking: Reported on 07/31/2024)     No current facility-administered medications for this visit.    Review of Systems: GENERAL: negative for malaise, night sweats HEENT: No changes in hearing or vision, no nose bleeds or other nasal problems. NECK: Negative for lumps, goiter, pain and significant neck swelling RESPIRATORY: Negative for cough, wheezing CARDIOVASCULAR: Negative for chest pain, leg swelling, palpitations, orthopnea GI: SEE HPI MUSCULOSKELETAL: Negative for joint pain or swelling, back pain, and muscle pain. SKIN: Negative for lesions, rash PSYCH: Negative for sleep disturbance, mood disorder and recent psychosocial stressors. HEMATOLOGY Negative for prolonged bleeding, bruising easily, and swollen nodes. ENDOCRINE: Negative for cold or heat intolerance, polyuria, polydipsia and goiter. NEURO: negative for tremor, gait imbalance, syncope and seizures. The remainder of the review of systems is noncontributory.   Physical Exam: BP 136/68   Pulse (!) 109   Temp 98.6 F (37 C)   Ht 5' 3 (1.6 m)   Wt 280 lb (127 kg)   BMI 49.60 kg/m  GENERAL: The patient is AO x3, in no acute distress. HEENT: Head is normocephalic and atraumatic. EOMI are intact. Mouth is well hydrated and without lesions. NECK:  Supple. No masses LUNGS: Clear to auscultation. No presence of rhonchi/wheezing/rales. Adequate chest expansion HEART: RRR, normal s1 and s2. ABDOMEN: Soft, nontender, no guarding, no peritoneal signs, and nondistended. BS +. No masses. RECTAL EXAM: no external  lesions, normal tone, no masses, brown stool without blood.*** Chaperone: EXTREMITIES: Without any cyanosis, clubbing, rash, lesions or edema. NEUROLOGIC: AOx3, no focal motor deficit. SKIN: no jaundice, no rashes  Imaging/Labs: as above  I personally reviewed and interpreted the available labs, imaging and endoscopic files.  Impression and Plan: GEANA WALTS is a 58 y.o. female coming for follow up of ***   All questions were answered.      Toribio Fortune, MD Gastroenterology and Hepatology [facility] Gastroenterology

## 2024-07-31 NOTE — Patient Instructions (Addendum)
 Continue with weekly Humira  Perform blood workup Proceed with the scheduled colonoscopy on 08/08/2024. Continue follow-up with rheumatology

## 2024-08-01 ENCOUNTER — Ambulatory Visit (INDEPENDENT_AMBULATORY_CARE_PROVIDER_SITE_OTHER): Payer: Self-pay | Admitting: Gastroenterology

## 2024-08-01 LAB — COMPREHENSIVE METABOLIC PANEL WITH GFR
AG Ratio: 1.6 (calc) (ref 1.0–2.5)
ALT: 18 U/L (ref 6–29)
AST: 15 U/L (ref 10–35)
Albumin: 4.1 g/dL (ref 3.6–5.1)
Alkaline phosphatase (APISO): 68 U/L (ref 37–153)
BUN: 14 mg/dL (ref 7–25)
CO2: 30 mmol/L (ref 20–32)
Calcium: 9 mg/dL (ref 8.6–10.4)
Chloride: 105 mmol/L (ref 98–110)
Creat: 0.55 mg/dL (ref 0.50–1.03)
Globulin: 2.6 g/dL (ref 1.9–3.7)
Glucose, Bld: 155 mg/dL — ABNORMAL HIGH (ref 65–139)
Potassium: 3.9 mmol/L (ref 3.5–5.3)
Sodium: 143 mmol/L (ref 135–146)
Total Bilirubin: 0.8 mg/dL (ref 0.2–1.2)
Total Protein: 6.7 g/dL (ref 6.1–8.1)
eGFR: 106 mL/min/1.73m2 (ref >=60)

## 2024-08-01 LAB — CBC WITH DIFFERENTIAL/PLATELET
Absolute Lymphocytes: 1960 {cells}/uL (ref 850–3900)
Absolute Monocytes: 476 {cells}/uL (ref 200–950)
Basophils Absolute: 29 {cells}/uL (ref 0–200)
Basophils Relative: 0.5 %
Eosinophils Absolute: 429 {cells}/uL (ref 15–500)
Eosinophils Relative: 7.4 %
HCT: 48.8 % — ABNORMAL HIGH (ref 35.9–46.0)
Hemoglobin: 15.7 g/dL — ABNORMAL HIGH (ref 11.7–15.5)
MCH: 31.1 pg (ref 27.0–33.0)
MCHC: 32.2 g/dL (ref 31.6–35.4)
MCV: 96.6 fL (ref 81.4–101.7)
MPV: 10.8 fL (ref 7.5–12.5)
Monocytes Relative: 8.2 %
Neutro Abs: 2906 {cells}/uL (ref 1500–7800)
Neutrophils Relative %: 50.1 %
Platelets: 236 Thousand/uL (ref 140–400)
RBC: 5.05 Million/uL (ref 3.80–5.10)
RDW: 11.7 % (ref 11.0–15.0)
Total Lymphocyte: 33.8 %
WBC: 5.8 Thousand/uL (ref 3.8–10.8)

## 2024-08-01 LAB — C-REACTIVE PROTEIN: CRP: <3 mg/L (ref <8.0)

## 2024-08-03 ENCOUNTER — Encounter (HOSPITAL_COMMUNITY)
Admission: RE | Admit: 2024-08-03 | Discharge: 2024-08-03 | Disposition: A | Source: Ambulatory Visit | Attending: Gastroenterology

## 2024-08-03 ENCOUNTER — Encounter (HOSPITAL_COMMUNITY): Payer: Self-pay

## 2024-08-03 ENCOUNTER — Other Ambulatory Visit: Payer: Self-pay

## 2024-08-08 ENCOUNTER — Other Ambulatory Visit: Payer: Self-pay

## 2024-08-08 ENCOUNTER — Encounter (HOSPITAL_COMMUNITY): Admission: RE | Disposition: A | Payer: Self-pay | Source: Home / Self Care | Attending: Gastroenterology

## 2024-08-08 ENCOUNTER — Encounter (HOSPITAL_COMMUNITY): Payer: Self-pay | Admitting: Gastroenterology

## 2024-08-08 ENCOUNTER — Ambulatory Visit (HOSPITAL_COMMUNITY): Payer: Self-pay | Admitting: Anesthesiology

## 2024-08-08 ENCOUNTER — Ambulatory Visit (HOSPITAL_COMMUNITY)
Admission: RE | Admit: 2024-08-08 | Discharge: 2024-08-08 | Disposition: A | Attending: Gastroenterology | Admitting: Gastroenterology

## 2024-08-08 ENCOUNTER — Encounter (HOSPITAL_COMMUNITY): Payer: Self-pay | Admitting: Anesthesiology

## 2024-08-08 HISTORY — PX: COLONOSCOPY: SHX5424

## 2024-08-08 HISTORY — PX: POLYPECTOMY: SHX149

## 2024-08-08 LAB — HM COLONOSCOPY

## 2024-08-08 LAB — GLUCOSE, CAPILLARY: Glucose-Capillary: 116 mg/dL — ABNORMAL HIGH (ref 70–99)

## 2024-08-08 SURGERY — COLONOSCOPY
Anesthesia: Monitor Anesthesia Care

## 2024-08-08 MED ORDER — PROPOFOL 500 MG/50ML IV EMUL
INTRAVENOUS | Status: DC | PRN
  Administered 2024-08-08: 80 mg via INTRAVENOUS
  Administered 2024-08-08: 125 ug/kg/min via INTRAVENOUS
  Administered 2024-08-08: 20 mg via INTRAVENOUS
  Administered 2024-08-08: 30 mg via INTRAVENOUS

## 2024-08-08 MED ORDER — LACTATED RINGERS IV SOLN: INTRAVENOUS | Status: DC

## 2024-08-08 MED ORDER — DEXMEDETOMIDINE HCL IN NACL 80 MCG/20ML IV SOLN
INTRAVENOUS | Status: DC | PRN
  Administered 2024-08-08: 6 ug via INTRAVENOUS

## 2024-08-08 MED ORDER — LIDOCAINE 2% (20 MG/ML) 5 ML SYRINGE
INTRAMUSCULAR | Status: DC | PRN
  Administered 2024-08-08: 60 mg via INTRAVENOUS

## 2024-08-08 NOTE — Interval H&P Note (Signed)
 History and Physical Interval Note:  08/08/2024 8:08 AM  [name]  has presented today for surgery, with the diagnosis of crohns.  The various methods of treatment have been discussed with the patient and family. After consideration of risks, benefits and other options for treatment, the patient has consented to  Procedure(s) with comments: COLONOSCOPY (N/A) - 10:15am, asa 1-2 as a surgical intervention.  The patient's history has been reviewed, patient examined, no change in status, stable for surgery.  I have reviewed the patient's chart and labs.  Questions were answered to the patient's satisfaction.     [name]

## 2024-08-08 NOTE — Discharge Instructions (Signed)
 You are being discharged to home.  Resume your previous diet.  We are waiting for your pathology results.  Your physician has recommended a repeat colonoscopy in two years for surveillance.  Continue weekly Humira .

## 2024-08-08 NOTE — Anesthesia Postprocedure Evaluation (Signed)
 Anesthesia Post Note  Patient: [name]  Procedure(s) Performed: COLONOSCOPY POLYPECTOMY, INTESTINE  Patient location during evaluation: Phase II Anesthesia Type: MAC Level of consciousness: awake Pain management: pain level controlled Vital Signs Assessment: post-procedure vital signs reviewed and stable Respiratory status: spontaneous breathing and respiratory function stable Cardiovascular status: blood pressure returned to baseline and stable Postop Assessment: no headache and no apparent nausea or vomiting Anesthetic complications: no Comments: Late entry   No notable events documented.   Last Vitals:  Vitals:   08/08/24 0850 08/08/24 1021  BP: (!) 171/82 (!) 115/58  Pulse: (!) 107 (!) 101  Resp: 19 20  Temp: 36.9 C 36.6 C  SpO2: 97% 96%    Last Pain:  Vitals:   08/08/24 1021  TempSrc: Oral  PainSc: 0-No pain                 Yvonna JINNY Bosworth

## 2024-08-08 NOTE — Anesthesia Preprocedure Evaluation (Signed)
 Anesthesia Evaluation  Patient identified by MRN, date of birth, ID band Patient awake    Reviewed: Allergy & Precautions, H&P , NPO status , Patient's Chart, lab work & pertinent test results, reviewed documented beta blocker date and time   Airway Mallampati: II  TM Distance: >3 FB Neck ROM: full    Dental no notable dental hx.    Pulmonary asthma , sleep apnea , pneumonia   Pulmonary exam normal breath sounds clear to auscultation       Cardiovascular Exercise Tolerance: Good hypertension, + Valvular Problems/Murmurs  Rhythm:regular Rate:Normal     Neuro/Psych   Anxiety      Neuromuscular disease  negative psych ROS   GI/Hepatic Neg liver ROS,GERD  ,,  Endo/Other  diabetes  Class 4 obesity  Renal/GU Renal disease  negative genitourinary   Musculoskeletal   Abdominal   Peds  Hematology negative hematology ROS (+)   Anesthesia Other Findings   Reproductive/Obstetrics negative OB ROS                              Anesthesia Physical Anesthesia Plan  ASA: 3  Anesthesia Plan: MAC   Post-op Pain Management:    Induction:   PONV Risk Score and Plan: Propofol  infusion  Airway Management Planned:   Additional Equipment:   Intra-op Plan:   Post-operative Plan:   Informed Consent: I have reviewed the patients History and Physical, chart, labs and discussed the procedure including the risks, benefits and alternatives for the proposed anesthesia with the patient or authorized representative who has indicated his/her understanding and acceptance.     Dental Advisory Given  Plan Discussed with: CRNA  Anesthesia Plan Comments:         Anesthesia Quick Evaluation

## 2024-08-08 NOTE — Transfer of Care (Signed)
 Immediate Anesthesia Transfer of Care Note  Patient: [name]  Procedure(s) Performed: COLONOSCOPY POLYPECTOMY, INTESTINE  Patient Location: Short Stay  Anesthesia Type:MAC  Level of Consciousness: drowsy and patient cooperative  Airway & Oxygen Therapy: Patient Spontanous Breathing  Post-op Assessment: Report given to RN and Post -op Vital signs reviewed and stable  Post vital signs: Reviewed and stable  Last Vitals:  Vitals Value Taken Time  BP 115/58 08/08/24   1017  Temp 97.9 08/08/24   1017  Pulse 102 08/08/24   1017  Resp 24 08/08/24   1017  SpO2 98% 08/08/24   1017    Last Pain:  Vitals:   08/08/24 0942  TempSrc:   PainSc: 0-No pain         Complications: No notable events documented.

## 2024-08-08 NOTE — Op Note (Signed)
 [facility] Patient Name: [name] Procedure Date: 08/08/2024 9:20 AM MRN: [id number] Date of Birth: 10/30/1965 Attending MD: Toribio Fortune , , [id number] CSN: [id number] Age: 58 Admit Type: Outpatient Procedure:                Colonoscopy Indications:              High risk colon cancer surveillance: Crohn's                            colitis of 8 (or more) years duration with                            one-third (or more) of the colon involved Providers:                Toribio Fortune, Jon LABOR. Gerome RN, RN, Daphne Mulch Technician, Pensions Consultant Referring MD:              Medicines:                Monitored Anesthesia Care Complications:            No immediate complications. Estimated Blood Loss:     Estimated blood loss: none. Procedure:                Pre-Anesthesia Assessment:                           - Prior to the procedure, a History and Physical                            was performed, and patient medications, allergies                            and sensitivities were reviewed. The patient's                            tolerance of previous anesthesia was reviewed.                           - The risks and benefits of the procedure and the                            sedation options and risks were discussed with the                            patient. All questions were answered and informed                            consent was obtained.                           - ASA Grade Assessment: III - A patient with severe  systemic disease.                           After obtaining informed consent, the colonoscope                            was passed under direct vision. Throughout the                            procedure, the patient's blood pressure, pulse, and                            oxygen saturations were monitored continuously. The                            [id number]-[id number] ([id number]) Peds Colon was  introduced                            through the anus and advanced to the the terminal                            ileum. The colonoscopy was performed without                            difficulty. The patient tolerated the procedure                            well. The quality of the bowel preparation was                            excellent. Scope In: 9:47:33 AM Scope Out: 10:11:30 AM Scope Withdrawal Time: 0 hours 18 minutes 25 seconds  Total Procedure Duration: 0 hours 23 minutes 57 seconds  Findings:      The perianal and digital rectal examinations were normal.      The terminal ileum appeared normal.      A 6 mm polyp was found in the cecum. The polyp was sessile. The polyp       was removed with a cold snare. Resection and retrieval were complete.      Patchy pseudopolyps were found in the transverse colon and in the       ascending colon. Imaging was performed using white light and narrow band       imaging to visualize the mucosa.      A 5 mm polyp was found in the sigmoid colon. The polyp was semi-sessile.       The polyp was removed with a cold snare. Resection and retrieval were       complete.      The retroflexed view of the distal rectum and anal verge was normal and       showed no anal or rectal abnormalities. Impression:               - The examined portion of the ileum was normal.                           - One 6 mm polyp in the cecum, removed  with a cold                            snare. Resected and retrieved.                           - Pseudopolyps in the transverse colon and in the                            ascending colon.                           - One 5 mm polyp in the sigmoid colon, removed with                            a cold snare. Resected and retrieved.                           - The distal rectum and anal verge are normal on                            retroflexion view. Moderate Sedation:      Per Anesthesia Care Recommendation:           -  Discharge patient to home (ambulatory).                           - Resume previous diet.                           - Await pathology results.                           - Repeat colonoscopy in 2 years for surveillance.                           - Continue weekly Humira . Procedure Code(s):        --- Professional ---                           [phone], Colonoscopy, flexible; with removal of                            tumor(s), polyp(s), or other lesion(s) by snare                            technique Diagnosis Code(s):        --- Professional ---                           K50.10, Crohn's disease of large intestine without                            complications                           D12.0, Benign neoplasm of cecum  D12.5, Benign neoplasm of sigmoid colon                           K51.40, Inflammatory polyps of colon without                            complications CPT copyright 2022 American Medical Association. All rights reserved. The codes documented in this report are preliminary and upon coder review may  be revised to meet current compliance requirements. Toribio Fortune, MD Toribio Fortune,  08/08/2024 10:16:58 AM This report has been signed electronically. Number of Addenda: 0

## 2024-08-08 NOTE — Anesthesia Procedure Notes (Signed)
 Date/Time: 08/08/2024 9:41 AM  Performed by: Para Jerelene CROME, CRNAOxygen Delivery Method: Nasal cannula

## 2024-08-09 ENCOUNTER — Encounter (INDEPENDENT_AMBULATORY_CARE_PROVIDER_SITE_OTHER): Payer: Self-pay | Admitting: *Deleted

## 2024-08-09 NOTE — Progress Notes (Signed)
 Chief Complaint:  Chief Complaint  Patient presents with   Menopause    Assessment/Plan: Assessment & Plan Menopausal Hormone Therapy Vasomotor symptoms, vaginal dryness, brain fog, fatigue, mood changes, and depression worsened post-menopause.  Detailed discussion of menopausal symptoms and menopausal management including nonpharmacological, nonhormonal and menopause hormone therapy.  Currently estrogen therapy contraindicated due to venous thromboembolism history however would strongly recommend consult with [facility] complex menopause clinic..  - Referral to [facility] menopause clinic for complex management.  Patient in agreement with plan. -Plan to order blood work for any potential causes that could be mimicking symptoms.  Patient in agreement with plan. - Up-to-date on mammogram. - New orders entered for updated DEXA scan, discussed association between osteoporosis and menopause.  Genitourinary Syndrome of Menopause Vaginal dryness and dyspareunia.  Discussed genitourinary syndrome of menopause in detail.  Patient is not currently having any urinary symptoms.  Vaginal estrogen therapy discussed as safe due to no systemic absorption.  Discussed all available methods to patient.   - After detailed discussion, patient wished to trial Imvexxy, prescribed Imvexxy. - Recommended silicone-based lubricant. - Prescription be sent to my [facility] pharmacy, patient will contact me if she has any issues getting her medication.  Osteoporosis Screening and Prevention Due for DEXA scan.  Orders entered.  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 Estrogen therapy contraindicated. Transdermal estrogen may be safer but needs specialist evaluation. - Referred to [facility] menopause clinic for hormone therapy evaluation.   Major Depressive Disorder Depression worsened post-mother's passing, but she admits been difficult to manage  since menopausal transition. Discussed therapy and medication management benefits. - Provided information on [facility].  Patient is not currently interested in a referral, but will research the clinic and let me know if she desires a consult.  Patient is encouraged to contact me with questions/concerns or worsening of symptoms.  Referral will be placed to [facility] menopause clinic.  Follow-up pending lab results/referral/consult  HPI: History of Present Illness [name] is a 58 year old female here today to discuss menopausal symptoms  Complex medication history of rheumatoid arthritis, major depression, type 2 diabetes (most recent A1C 6.5% at PCP), hypertension, hyperlipidemia, and a history of DVT and pulmonary embolism (during 2021 hospitalization with COVID).  Transitioned into menopause around 5 years ago per patient report.  She experiences vasomotor symptoms, including hot flashes occurring both during the day and at night, approximately four out of seven days a week. The hot flashes have improved slightly but still cause significant discomfort, such as profuse sweating without any apparent reason.  She describes experiencing 'brain fog' which has worsened, making it difficult for her to articulate her thoughts and affecting her memory. Mood changes and an increase in her depression have been noted, particularly following the death of her mother in 2024/06/11. She has a history of major depression and is currently managed by Doctor Blondie, PCP.  She has never or psychiatry or therapist.  She does note that her depression has been more difficult to manage since transitioning through menopause.  Denies any suicidal thoughts or ideations.  Vaginal dryness has recently started, causing discomfort during intercourse. No recent urinary tract infections, though she has had them in the past.  She has a history of deep vein thrombosis (DVT) and pulmonary  embolism, which occurred during a COVID-19 infection in 2021. She was hospitalized from August to October 2021 and had multiple clots throughout her  body, including her legs and lungs. She was treated with blood thinners but is no longer on them. There is no family history of blood clots/blood clotting disorder.  Her type 2 diabetes is managed with a recent A1c of 6.5.  Managed by PCP.  Unfortunately do not have access to those records.  BMI currently 49.   History of rheumatoid arthritis, Crohn's disease.  No personal or family history of thyroid problems.  Mammogram completed December 2025.  DEXA scan 2022, normal.  Has not had updated screening.  Blood pressure elevated today.      Objective: Vitals:   08/09/24 1051  BP: 182/74  BP Position: Sitting  Weight: (!) 127.4 kg (280 lb 12.8 oz)  Height: 160 cm (5' 3)    Body mass index is 49.74 kg/m.   Results: Results LABS A1c: 6.5  RADIOLOGY DEXA scan: normal (2022)    Current Medications[1]  Allergies[2]  Past Medical History[3]  Past Surgical History[4]  OB History  Gravida Para Term Preterm AB Living  3 2 2  1 2   SAB IAB Ectopic Molar Multiple Live Births  1     2    # Outcome Date GA Lbr Len/2nd Weight Sex Type Anes PTL Lv  3 SAB           2 Term      Vag-Spont     1 Term      Vag-Spont       Social History[5]  Family History[6]  Review Of Systems  General: no fatigue, unexpected weight loss or gain  Dermatologic: denies rashes, other skin lesions or changes  Neurological: denies dizziness, weakness, fainting or frequent headaches. No issues with memory or concentration. No visual or hearing changes.  Lymph: no tender or swollen lymph nodes  Endocrine: no polydypsia, polyphagia, polyuria, heat/cold intolerance, hot flashes, dry skin Respiratory: No shortness of breath, wheezing or cough, endurance is normal  Cardiovascular: denies chest pain or heaviness. No racing or pounding heart episodes, no  palpitations, dizziness or shortness of breath. No edema. Gastrointestinal: denies acid reflux, nausea, vomiting, appetite is normal.  No abdominal pain, unusual bloating or gas. No constipation or diarrhea.  Denies bloody stools, pain with BMs or difficulty having a BM.  No hemorrhoid issues.   Urology: no frequency, dysuria, flank pain, hematuria, incontinence, retention. Urgency, or difficulty urinating.       Female Reproductive: She denies abnormal vaginal discharge, pelvic pain, abnormal vaginal bleeding, breast concerns, reports menopausal symptoms.   Musk: no weakness, arthralgia, joint swelling, ROM limitations Breasts: no lumps, nipple discharge, breast pain, or abnormal findings.  Psychology: No depression or anxiety. No premenstrual symptoms, no irritability.  No changes in appetite. Denies mental & physical abuse.    Physical Exam Constitutional: Well-developed, well-nourished female in no acute distress Physiologic: Affect normal with appropriate emotion response, normal judgement. Orientation alert and oriented X3. Recall normal. Normal eye contact  Neck: supple, no lymphadenopathy, no thyromegaly or nodules.  Respiratory: Clear to auscultation bilaterally without abnormal breath sounds. Good air movement with normal work of  breathing. Cardiovascular: Regular rate and rhythm without auscultated murmurs, click or rubs. No skipped beats. Extremities grossly normal, nontender with no edema; pulses regular Gastrointestinal: Soft, nontender, nondistended. No masses or hernias appreciated. No hepatosplenomegaly. No fluid wave. No rebound or guarding. Back: No CVAT Bilaterally Peripheral: Radial pulses 2+ bilaterally, DP pulses bilaterally 2+ Dermatology:  The pt's skin is warm and dry, without lesions of concern, normal moles/freckling  Cheryl Lennie Amas, FNP           [1]  Current Outpatient Medications:    adalimumab -adaz 40 mg/0.4 mL PnIj, Inject 1 each under the  skin., Disp: , Rfl:    albuterol  HFA 90 mcg/actuation inhaler, Inhale 2 puffs every four (4) hours as needed for wheezing or shortness of breath., Disp: , Rfl:    azelastine (ASTELIN) 137 mcg (0.1 %) nasal spray, USE ONE SPRAY IN EACH NOSTRIL ONCE DAILY. SHAKE GENTLY BEFORE USING. (100 DAY SUPPLY), Disp: , Rfl:    busPIRone (BUSPAR) 15 MG tablet, , Disp: , Rfl:    calcium  carbonate-vitamin D3 500 mg-10 mcg (400 unit) per tablet, Chew 1,200 mg., Disp: , Rfl:    certolizumab pegol (CIMZIA SUBQ), Inject under the skin., Disp: , Rfl:    clindamycin  (CLEOCIN  T) 1 % external solution, Apply to affected area 2 times daily, Disp: 60 mL, Rfl: 2   dicyclomine  (BENTYL ) 20 mg tablet, Take 1 tablet (20 mg total) by mouth Three (3) times a day., Disp: , Rfl:    DULoxetine  (CYMBALTA ) 60 MG capsule, Take 1 capsule (60 mg total) by mouth daily. Take two 60mg  tablets by mouth once daily, Disp: , Rfl:    empagliflozin (JARDIANCE) 25 mg tablet, Take 1 tablet (25 mg total) by mouth daily., Disp: , Rfl:    fluticasone  propion-salmeteroL (ADVAIR , WIXELA) 250-50 mcg/dose diskus, Inhale 1 puff two (2) times a day., Disp: , Rfl:    fluticasone  propionate (FLONASE) 50 mcg/actuation nasal spray, 1 spray into each nostril., Disp: , Rfl:    L.acid/L.casei/B.bif/B.lon/FOS (PROBIOTIC BLEND ORAL), Take 200 mg by mouth daily., Disp: , Rfl:    leflunomide (ARAVA) 20 MG tablet, Take 1 tablet (20 mg total) by mouth daily., Disp: , Rfl:    levocetirizine (XYZAL) 5 MG tablet, Take 1 tablet (5 mg total) by mouth every evening., Disp: , Rfl:    magnesium oxide, bulk, 100 % Powd, Take 1,000 mg by mouth., Disp: , Rfl:    mesalamine  (LIALDA ) 1.2 gram DR tablet, Take 1 tablet (1.2 g total) by mouth two (2) times a day., Disp: , Rfl:    olmesartan (BENICAR) 40 MG tablet, Take 1 tablet (40 mg total) by mouth daily., Disp: , Rfl:    PROTONIX  40 mg tablet, , Disp: , Rfl:    rosuvastatin (CRESTOR) 20 MG tablet, , Disp: , Rfl:     traZODone  (DESYREL ) 50 MG tablet, Take 2 tablets (100 mg total) by mouth nightly., Disp: , Rfl:    estradiol (IMVEXXY STARTER PACK) 10 mcg InPk, Insert 10 mcg into the vagina daily. Starter pack-insert 1 pill into vagina daily x14 days then decrease to 1 pill inserted into the vagina 2 times a week., Disp: 18 each, Rfl: 0 [2] Allergies Allergen Reactions   Infliximab Swelling    Joints locked up   Cefprozil Rash   Penicillins Rash  [3] Past Medical History: Diagnosis Date   Abnormal Pap smear of cervix    Acute respiratory failure with hypoxia    (CMS-HCC) 04/16/2020   Anxiety    Breast injury 2015   Fell flat out onto both breast with bilateral brusing   Crohn disease    (CMS-HCC)    DVT, lower extremity (CMS-HCC)    Hyperlipidemia    Hypertension    Hypokalemia 04/16/2020   Moderate major depression (CMS-HCC) 04/17/2020   Moderate protein-calorie malnutrition (HHS-HCC) 04/16/2020   OSA (obstructive sleep apnea) 04/16/2020   Pulmonary embolism (CMS-HCC)  Rheumatoid arthritis    (CMS-HCC) 04/2023   Umbilical hernia    Uncontrolled type 2 diabetes mellitus with hyperglycemia (CMS-HCC) 04/16/2020  [4] Past Surgical History: Procedure Laterality Date   ABDOMINAL SURGERY  03/31/2013   DR. JENKINS. LAP CHOLECYSTECTOMY.   CARPAL TUNNEL RELEASE Bilateral 2021   DR. VARKEY.    CHOLECYSTECTOMY     COLONOSCOPY  08/08/2024   JOINT REPLACEMENT Right 04/18/2012   DR. YATES. RIGHT TOTAL HIP ARTHROPLASTY AT [facility].   PR RIGHT HEART CATH O2 SATURATION & CARDIAC OUTPUT N/A 05/20/2020   Procedure: Right Heart Catheterization W Intervention;  Surgeon: Norleen Deward Grumbling, MD;  [facility] CATH;  Service: Cardiology   TONSILLECTOMY  CHILDHOOD   TOTAL SHOULDER REPLACEMENT Left 07/09/2021  [5] Social History Socioeconomic History   Marital status: Married    Spouse name: NEIL Older   Number of children: 2   Years of education: None   Highest  education level: None  Tobacco Use   Smoking status: Never    Passive exposure: Past   Smokeless tobacco: Never  Substance and Sexual Activity   Alcohol use: Not Currently   Drug use: Never   Sexual activity: Yes    Partners: Male   Social Drivers of Health   Food Insecurity: No Food Insecurity (08/09/2024)   Hunger Vital Sign    Worried About Running Out of Food in the Last Year: Never true    Ran Out of Food in the Last Year: Never true  Tobacco Use: Low Risk (08/09/2024)   Patient History    Smoking Tobacco Use: Never    Smokeless Tobacco Use: Never    Passive Exposure: Past  Transportation Needs: No Transportation Needs (08/09/2024)   PRAPARE - Transportation    Lack of Transportation (Medical): No    Lack of Transportation (Non-Medical): No  Alcohol Use: Not At Risk (08/09/2024)   Alcohol Use    How often do you have a drink containing alcohol?: Never  Housing: Low Risk (08/09/2024)   Housing    Within the past 12 months, have you ever stayed: outside, in a car, in a tent, in an overnight shelter, or temporarily in someone else's home (i.e. couch-surfing)?: No    Are you worried about losing your housing?: No  Utilities: Low Risk (08/09/2024)   Utilities    Within the past 12 months, have you been unable to get utilities (heat, electricity) when it was really needed?: No  Interpersonal Safety: Not At Risk (08/09/2024)   Interpersonal Safety    Unsafe Where You Currently Live: No    Physically Hurt by Anyone: No    Abused by Anyone: No  Substance Use: Low Risk (08/09/2024)   Substance Use    In the past year, how often have you used prescription drugs for non-medical reasons?: Never    In the past year, how often have you used illegal drugs?: Never    In the past year, have you used any substance for non-medical reasons?: No  Financial Resource Strain: Low Risk (08/09/2024)   Overall Financial Resource Strain (CARDIA)    Difficulty of Paying  Living Expenses: Not hard at all  Health Literacy: Low Risk (08/09/2024)   Health Literacy    : Never  Internet Connectivity: Internet connectivity concern unknown (08/09/2024)   Internet Connectivity    Do you have access to internet services: Yes  [6] Family History Problem Relation Age of Onset   COPD Mother    Heart disease Mother  Hypertension Mother    Hyperlipidemia Mother    Diabetes Mother    Parkinsonism Father    Hypertension Father    Melanoma Maternal Grandfather    Diabetes Maternal Grandfather    Cancer Maternal Grandfather    Heart disease Maternal Grandfather    Breast cancer Maternal Aunt        great aunt- late 26's

## 2024-08-10 ENCOUNTER — Encounter (HOSPITAL_COMMUNITY): Payer: Self-pay | Admitting: Gastroenterology

## 2024-08-10 ENCOUNTER — Ambulatory Visit (INDEPENDENT_AMBULATORY_CARE_PROVIDER_SITE_OTHER): Payer: Self-pay | Admitting: Gastroenterology

## 2024-08-10 ENCOUNTER — Encounter (INDEPENDENT_AMBULATORY_CARE_PROVIDER_SITE_OTHER): Payer: Self-pay | Admitting: *Deleted

## 2024-08-10 LAB — SURGICAL PATHOLOGY

## 2024-08-10 NOTE — Progress Notes (Signed)
 2 yr TCS noted in recall Patient result letter mailed procedure note and pathology result faxed to PCP

## 2024-08-29 ENCOUNTER — Other Ambulatory Visit (INDEPENDENT_AMBULATORY_CARE_PROVIDER_SITE_OTHER): Payer: Self-pay | Admitting: Gastroenterology

## 2024-09-20 ENCOUNTER — Other Ambulatory Visit: Payer: Self-pay | Admitting: Physician Assistant

## 2024-09-20 ENCOUNTER — Encounter: Payer: Self-pay | Admitting: Physician Assistant

## 2024-09-20 DIAGNOSIS — H9311 Tinnitus, right ear: Secondary | ICD-10-CM

## 2024-09-21 ENCOUNTER — Ambulatory Visit
Admission: RE | Admit: 2024-09-21 | Discharge: 2024-09-21 | Disposition: A | Discharge status: Home / Self Care | Source: Ambulatory Visit | Attending: Physician Assistant | Admitting: Physician Assistant

## 2024-09-21 DIAGNOSIS — H9311 Tinnitus, right ear: Secondary | ICD-10-CM

## 2024-09-21 MED ORDER — GADOPICLENOL 0.5 MMOL/ML IV SOLN
10.0000 mL | Freq: Once | INTRAVENOUS | Status: AC | PRN
  Administered 2024-09-21: 10 mL via INTRAVENOUS

## 2024-09-28 ENCOUNTER — Other Ambulatory Visit: Payer: Self-pay | Admitting: Physician Assistant

## 2024-09-28 ENCOUNTER — Encounter: Payer: Self-pay | Admitting: Physician Assistant

## 2024-09-28 DIAGNOSIS — R9089 Other abnormal findings on diagnostic imaging of central nervous system: Secondary | ICD-10-CM

## 2024-09-28 DIAGNOSIS — H9311 Tinnitus, right ear: Secondary | ICD-10-CM

## 2024-09-29 ENCOUNTER — Encounter: Payer: Self-pay | Admitting: Physician Assistant

## 2024-10-03 ENCOUNTER — Inpatient Hospital Stay
Admission: RE | Admit: 2024-10-03 | Discharge: 2024-10-03 | Attending: Physician Assistant | Admitting: Physician Assistant

## 2024-10-03 DIAGNOSIS — R9089 Other abnormal findings on diagnostic imaging of central nervous system: Secondary | ICD-10-CM

## 2024-10-03 DIAGNOSIS — H9311 Tinnitus, right ear: Secondary | ICD-10-CM

## 2024-10-03 MED ORDER — IOPAMIDOL (ISOVUE-370) INJECTION 76%
75.0000 mL | Freq: Once | INTRAVENOUS | Status: AC | PRN
  Administered 2024-10-03: 75 mL via INTRAVENOUS

## 2024-11-30 ENCOUNTER — Ambulatory Visit: Payer: Self-pay | Admitting: Nurse Practitioner
